# Patient Record
Sex: Female | Born: 1945
Health system: Southern US, Community
[De-identification: ages and names within clinical notes are randomized; demographics above are authoritative.]

## PROBLEM LIST (undated history)

## (undated) DIAGNOSIS — I499 Cardiac arrhythmia, unspecified: Secondary | ICD-10-CM

## (undated) DIAGNOSIS — J189 Pneumonia, unspecified organism: Secondary | ICD-10-CM

## (undated) DIAGNOSIS — K219 Gastro-esophageal reflux disease without esophagitis: Secondary | ICD-10-CM

## (undated) DIAGNOSIS — I491 Atrial premature depolarization: Secondary | ICD-10-CM

## (undated) DIAGNOSIS — I82622 Acute embolism and thrombosis of deep veins of left upper extremity: Secondary | ICD-10-CM

## (undated) DIAGNOSIS — I493 Ventricular premature depolarization: Secondary | ICD-10-CM

## (undated) DIAGNOSIS — R739 Hyperglycemia, unspecified: Secondary | ICD-10-CM

## (undated) DIAGNOSIS — I4891 Unspecified atrial fibrillation: Secondary | ICD-10-CM

## (undated) DIAGNOSIS — D72829 Elevated white blood cell count, unspecified: Secondary | ICD-10-CM

## (undated) DIAGNOSIS — C8590 Non-Hodgkin lymphoma, unspecified, unspecified site: Secondary | ICD-10-CM

## (undated) DIAGNOSIS — E46 Unspecified protein-calorie malnutrition: Secondary | ICD-10-CM

## (undated) DIAGNOSIS — R5381 Other malaise: Secondary | ICD-10-CM

## (undated) DIAGNOSIS — J45909 Unspecified asthma, uncomplicated: Secondary | ICD-10-CM

## (undated) DIAGNOSIS — N200 Calculus of kidney: Secondary | ICD-10-CM

## (undated) DIAGNOSIS — Z87898 Personal history of other specified conditions: Secondary | ICD-10-CM

## (undated) DIAGNOSIS — R0989 Other specified symptoms and signs involving the circulatory and respiratory systems: Secondary | ICD-10-CM

## (undated) DIAGNOSIS — N12 Tubulo-interstitial nephritis, not specified as acute or chronic: Secondary | ICD-10-CM

## (undated) DIAGNOSIS — D496 Neoplasm of unspecified behavior of brain: Secondary | ICD-10-CM

## (undated) DIAGNOSIS — R1310 Dysphagia, unspecified: Secondary | ICD-10-CM

## (undated) DIAGNOSIS — I209 Angina pectoris, unspecified: Secondary | ICD-10-CM

## (undated) DIAGNOSIS — I480 Paroxysmal atrial fibrillation: Secondary | ICD-10-CM

## (undated) DIAGNOSIS — M5136 Other intervertebral disc degeneration, lumbar region: Secondary | ICD-10-CM

## (undated) DIAGNOSIS — E876 Hypokalemia: Secondary | ICD-10-CM

## (undated) DIAGNOSIS — K449 Diaphragmatic hernia without obstruction or gangrene: Secondary | ICD-10-CM

## (undated) DIAGNOSIS — R911 Solitary pulmonary nodule: Secondary | ICD-10-CM

## (undated) DIAGNOSIS — I219 Acute myocardial infarction, unspecified: Secondary | ICD-10-CM

## (undated) DIAGNOSIS — K7689 Other specified diseases of liver: Secondary | ICD-10-CM

## (undated) DIAGNOSIS — I252 Old myocardial infarction: Secondary | ICD-10-CM

## (undated) DIAGNOSIS — G8918 Other acute postprocedural pain: Secondary | ICD-10-CM

## (undated) DIAGNOSIS — M51369 Other intervertebral disc degeneration, lumbar region without mention of lumbar back pain or lower extremity pain: Secondary | ICD-10-CM

## (undated) DIAGNOSIS — I2510 Atherosclerotic heart disease of native coronary artery without angina pectoris: Secondary | ICD-10-CM

## (undated) DIAGNOSIS — R42 Dizziness and giddiness: Secondary | ICD-10-CM

## (undated) DIAGNOSIS — E8809 Other disorders of plasma-protein metabolism, not elsewhere classified: Secondary | ICD-10-CM

## (undated) DIAGNOSIS — T380X5A Adverse effect of glucocorticoids and synthetic analogues, initial encounter: Secondary | ICD-10-CM

## (undated) DIAGNOSIS — M1990 Unspecified osteoarthritis, unspecified site: Secondary | ICD-10-CM

## (undated) DIAGNOSIS — H8110 Benign paroxysmal vertigo, unspecified ear: Secondary | ICD-10-CM

## (undated) DIAGNOSIS — I10 Essential (primary) hypertension: Secondary | ICD-10-CM

## (undated) DIAGNOSIS — Z9889 Other specified postprocedural states: Secondary | ICD-10-CM

## (undated) DIAGNOSIS — R112 Nausea with vomiting, unspecified: Secondary | ICD-10-CM

## (undated) DIAGNOSIS — K118 Other diseases of salivary glands: Secondary | ICD-10-CM

## (undated) DIAGNOSIS — Z87442 Personal history of urinary calculi: Secondary | ICD-10-CM

## (undated) HISTORY — DX: Personal history of other specified conditions: Z87.898

## (undated) HISTORY — DX: Tubulo-interstitial nephritis, not specified as acute or chronic: N12

## (undated) HISTORY — DX: Other malaise: R53.81

## (undated) HISTORY — DX: Gastro-esophageal reflux disease without esophagitis: K21.9

## (undated) HISTORY — DX: Unspecified protein-calorie malnutrition: E46

## (undated) HISTORY — DX: Elevated white blood cell count, unspecified: D72.829

## (undated) HISTORY — DX: Dysphagia, unspecified: R13.10

## (undated) HISTORY — DX: Paroxysmal atrial fibrillation: I48.0

## (undated) HISTORY — DX: Neoplasm of unspecified behavior of brain: D49.6

## (undated) HISTORY — DX: Other acute postprocedural pain: G89.18

## (undated) HISTORY — DX: Old myocardial infarction: I25.2

## (undated) HISTORY — DX: Unspecified osteoarthritis, unspecified site: M19.90

## (undated) HISTORY — DX: Hypokalemia: E87.6

## (undated) HISTORY — DX: Other diseases of salivary glands: K11.8

## (undated) HISTORY — DX: Other specified diseases of liver: K76.89

## (undated) HISTORY — DX: Benign paroxysmal vertigo, unspecified ear: H81.10

## (undated) HISTORY — DX: Other intervertebral disc degeneration, lumbar region: M51.36

## (undated) HISTORY — DX: Other specified symptoms and signs involving the circulatory and respiratory systems: R09.89

## (undated) HISTORY — DX: Other disorders of plasma-protein metabolism, not elsewhere classified: E88.09

## (undated) HISTORY — DX: Unspecified atrial fibrillation: I48.91

## (undated) HISTORY — PX: OTHER SURGICAL HISTORY: SHX169

## (undated) HISTORY — DX: Atrial premature depolarization: I49.1

## (undated) HISTORY — PX: BREAST BIOPSY: SHX20

## (undated) HISTORY — PX: FINGER SURGERY: SHX640

## (undated) HISTORY — DX: Essential (primary) hypertension: I10

## (undated) HISTORY — DX: Hyperglycemia, unspecified: R73.9

## (undated) HISTORY — DX: Hyperglycemia, unspecified: T38.0X5A

## (undated) HISTORY — DX: Acute embolism and thrombosis of deep veins of left upper extremity: I82.622

## (undated) HISTORY — DX: Non-Hodgkin lymphoma, unspecified, unspecified site: C85.90

## (undated) HISTORY — DX: Dizziness and giddiness: R42

## (undated) HISTORY — DX: Calculus of kidney: N20.0

## (undated) HISTORY — DX: Diaphragmatic hernia without obstruction or gangrene: K44.9

## (undated) HISTORY — DX: Other intervertebral disc degeneration, lumbar region without mention of lumbar back pain or lower extremity pain: M51.369

## (undated) HISTORY — DX: Solitary pulmonary nodule: R91.1

---
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
## 1971-03-03 HISTORY — PX: PARTIAL THYMECTOMY: SHX2177

## 1972-03-02 HISTORY — PX: VAGINAL HYSTERECTOMY: SUR661

## 1998-02-11 ENCOUNTER — Emergency Department (HOSPITAL_COMMUNITY): Admission: EM | Admit: 1998-02-11 | Discharge: 1998-02-11 | Payer: Self-pay | Admitting: *Deleted

## 2012-04-30 HISTORY — PX: CERVICAL SPINE SURGERY: SHX589

## 2013-09-30 HISTORY — PX: CARPAL TUNNEL RELEASE: SHX101

## 2015-09-19 ENCOUNTER — Encounter: Payer: Self-pay | Admitting: Interventional Cardiology

## 2015-10-30 ENCOUNTER — Encounter: Payer: Self-pay | Admitting: *Deleted

## 2015-10-31 ENCOUNTER — Encounter: Payer: Self-pay | Admitting: Cardiology

## 2015-10-31 ENCOUNTER — Encounter (INDEPENDENT_AMBULATORY_CARE_PROVIDER_SITE_OTHER): Payer: Self-pay

## 2015-10-31 ENCOUNTER — Ambulatory Visit (INDEPENDENT_AMBULATORY_CARE_PROVIDER_SITE_OTHER): Payer: Medicare Other | Admitting: Cardiology

## 2015-10-31 VITALS — BP 150/90 | HR 61 | Ht 61.0 in | Wt 183.8 lb

## 2015-10-31 DIAGNOSIS — I480 Paroxysmal atrial fibrillation: Secondary | ICD-10-CM

## 2015-10-31 NOTE — Patient Instructions (Addendum)
Medication Instructions:  Your physician recommends that you continue on your current medications as directed. Please refer to the Current Medication list given to you today.  Labwork: None ordered  Testing/Procedures: Your physician has requested that you have an echocardiogram. Echocardiography is a painless test that uses sound waves to create images of your heart. It provides your doctor with information about the size and shape of your heart and how well your heart's chambers and valves are working. This procedure takes approximately one hour. There are no restrictions for this procedure.  Follow-Up: Your physician recommends that you schedule a follow-up appointment in: 3 months with Dr. Curt Bears.  We will work on obtaining records from [facility]  If you need a refill on your cardiac medications before your next appointment, please call your pharmacy.  Thank you for choosing [facility]!!   Trinidad Curet, RN [phone]

## 2015-10-31 NOTE — Progress Notes (Signed)
Please   Electrophysiology Office Note   Date:  10/31/2015   ID:  Kimberly Vaughn, DOB 1945-10-18, MRN [id number]  PCP:  Ernestene Kiel, MD Primary Electrophysiologist:  Constance Haw, MD    Chief Complaint  Patient presents with  . Advice Only    afib eval  . Dizziness     History  Heof Present Illness: Kimberly Vaughn is a 70 y.o. female who presents today for electrophysiology evaluatishe presents for evaluation of atrial fibrillation and dizziness. She says that she is dizzy all the time though she has episodes of worsening of her dizziness. She has, in the past, had MRIs been evaluated by neurology, as well as ENT and has not found a cause of dizziness. She does have vertigo and is on therapy for that. She says that the dizziness feels like the room is spinning, similar to being on a boat.   She was diagnosed with atrial fibrillation in May 2016 while getting a stress test done. She says that at times, her blood pressure cuff tells her that she is out of rhythm. She feels occasional fatigue and shortness of breath with palpitations. Currently, she is not anticoagulated and is only taking aspirin.   Today, she denies symptoms of chest pain, shortness of breath, orthopnea, PND, lower extremity edema, claudication, presyncope, syncope, bleeding, or neurologic sequela. The patient is tolerating medications without difficulties and is otherwise he is in without complaint today.    Past Medical History:  Diagnosis Date  . A-fib ([place])   . DDD (degenerative disc disease), lumbar   . Degenerative joint disease   . Dizziness and giddiness   . Episodic atrial fibrillation ([place])   . Esophageal reflux   . GERD (gastroesophageal reflux disease)   . Hiatal hernia   . Hypertension, essential   . Kidney stones   . Liver cyst LEFT  . Nodule of parotid gland   . Pulmonary nodule    Past Surgical History:  Procedure Laterality Date  . BREAST BIOPSY Bilateral   . CARPAL TUNNEL RELEASE  Right 09/2013  . CATARACTS Bilateral   . CERVICAL SPINE SURGERY  04/2012  . FINGER SURGERY Right    TRIGGER FINGER RELEASE  . PARTIAL THYMECTOMY  1973  . TONSILLECTOMY  1964     Current Outpatient Prescriptions  Medication Sig Dispense Refill  . aspirin 325 MG tablet Take 325 mg by mouth daily.    . Cholecalciferol (VITAMIN D3) 2000 units TABS Take 2,000 Units by mouth daily.    . Cyanocobalamin (VITAMIN B-12) 5000 MCG SUBL Place under the tongue daily.    Marland Kitchen esomeprazole (NEXIUM) 40 MG packet Take 40 mg by mouth daily before breakfast.    . hydrochlorothiazide (MICROZIDE) 12.5 MG capsule Take 12.5 mg by mouth daily.    . meclizine (ANTIVERT) 25 MG tablet Take 25 mg by mouth 3 (three) times daily as needed for dizziness.    . metoprolol tartrate (LOPRESSOR) 25 MG tablet Take 12.5 mg by mouth 2 (two) times daily.    . Policosanol 10 MG CAPS Take 20 mg by mouth daily.     No current facility-administered medications for this visit.     Allergies:   Review of patient's allergies indicates no known allergies.   Social History:  The patient  reports that she quit smoking about 10 years ago. Her smoking use included Cigarettes. She has never used smokeless tobacco. She reports that she does not use drugs.   Family History:  The patient's family  history includes Heart attack (age of onset: 37) in her father; Heart disease in her father; Heart disease (age of onset: 57) in her mother; Hypertension in her daughter and other; Lymphoma in her other; Melanoma in her other; Osteoarthritis in her other; Psoriasis in her mother.    ROS:  Please see the history of present illness.   Otherwise, review of systems is positive for palpitations, balance problems, dizziness.   All other systems are reviewed and negative.    PHYSICAL EXAM: VS:  BP (!) 150/90   Pulse 61   Ht 5\' 1"  (1.549 m)   Wt 183 lb 12.8 oz (83.4 kg)   BMI 34.73 kg/m  , BMI Body mass index is 34.73 kg/m. GEN: Well nourished, well  developed, in no acute distress  HEENT: normal  Neck: no JVD, carotid bruits, or masses Cardiac: RRR; no murmurs, rubs, or gallops,no edema  Respiratory:  clear to auscultation bilaterally, normal work of breathing GI: soft, nontender, nondistended, + BS MS: no deformity or atrophy  Skin: warm and dry Neuro:  Strength and sensation are intact Psych: euthymic mood, full affect  EKG:  EKG is ordered today. Personal review of the ekg ordered shows sinus rhythm, rate 61  Recent Labs: No results found for requested labs within last 8760 hours.    Lipid Panel  No results found for: CHOL, TRIG, HDL, CHOLHDL, VLDL, LDLCALC, LDLDIRECT   Wt Readings from Last 3 Encounters:  10/31/15 183 lb 12.8 oz (83.4 kg)      Other studies Reviewed: Additional studies/ records that were reviewed today include: PCP notes   ASSESSMENT AND PLAN:  1.  Atrial fibrillation: In sinus rhythm today. She has a history of apparently atrial fibrillation, though I do not have any documentation of that. She is not anticoagulated and is taking full dose aspirin. It is likely that she Kimberly Vaughn need anticoagulation, and I discussed with her the risks and benefits of multiple anticoagulants. Kimberly Vaughn work to obtain records of her atrial fibrillation from her stress test in May 2016. In the interim Kimberly Vaughn get a echocardiogram to determine her LV function. She is symptomatic apparently from her atrial fibrillation and therefore may benefit from a rhythm control strategy.   This patients CHA2DS2-VASc Score and unadjusted Ischemic Stroke Rate (% per year) is equal to 3.2 % stroke rate/year from a score of 3  Above score calculated as 1 point each if present [CHF, HTN, DM, Vascular=MI/PAD/Aortic Plaque, Age if 65-74, or Female] Above score calculated as 2 points each if present [Age > 75, or Stroke/TIA/TE]   2. Hypertension: Elevated today, but according to her home blood pressure cuff, is normally in the 120s. We'll not make any  changes to her medications.  3. Dizziness: At this time it is unclear what is causing her to be dizzy. She is dizzy today in clinic with a EKG that does not show any evidence of arrhythmia. It is unlikely, therefore, that she has an arrhythmogenic cause for her dizziness. We Kimberly Vaughn get a transthoracic echo to both evaluate her atrial fibrillation as well as see if there is a structural cause for dizziness.   Current medicines are reviewed at length with the patient today.   The patient does not have concerns regarding her medicines.  The following changes were made today:  none  Labs/ tests ordered today include:  No orders of the defined types were placed in this encounter.    Disposition:   FU with Kimberly Vaughn  3 months  Signed, Kimberly Neaves Meredith Leeds, MD  10/31/2015 9:32 AM     [facility] [address] [place] [address] [phone] (office) [phone] (fax)

## 2015-11-15 NOTE — Progress Notes (Signed)
Dr. Curt Bears reviewed stress test EKGs from [facility].  AFib noted.  Records scanned into EPIC.

## 2015-11-21 ENCOUNTER — Ambulatory Visit (HOSPITAL_COMMUNITY): Payer: Medicare Other | Attending: Internal Medicine

## 2015-11-21 ENCOUNTER — Other Ambulatory Visit: Payer: Self-pay

## 2015-11-21 DIAGNOSIS — I340 Nonrheumatic mitral (valve) insufficiency: Secondary | ICD-10-CM | POA: Insufficient documentation

## 2015-11-21 DIAGNOSIS — I119 Hypertensive heart disease without heart failure: Secondary | ICD-10-CM | POA: Insufficient documentation

## 2015-11-21 DIAGNOSIS — I480 Paroxysmal atrial fibrillation: Secondary | ICD-10-CM | POA: Insufficient documentation

## 2015-11-21 DIAGNOSIS — I4891 Unspecified atrial fibrillation: Secondary | ICD-10-CM | POA: Diagnosis present

## 2015-11-27 DIAGNOSIS — H9312 Tinnitus, left ear: Secondary | ICD-10-CM

## 2015-11-27 HISTORY — DX: Tinnitus, left ear: H93.12

## 2015-11-30 DIAGNOSIS — I480 Paroxysmal atrial fibrillation: Secondary | ICD-10-CM | POA: Insufficient documentation

## 2015-12-05 ENCOUNTER — Telehealth: Payer: Self-pay | Admitting: Cardiology

## 2015-12-05 NOTE — Telephone Encounter (Signed)
Records received from [facility] hold onto until [place] in Friday 10/6

## 2015-12-16 ENCOUNTER — Telehealth: Payer: Self-pay

## 2015-12-16 ENCOUNTER — Telehealth: Payer: Self-pay | Admitting: *Deleted

## 2015-12-16 MED ORDER — APIXABAN 5 MG PO TABS: 5.0000 mg | ORAL_TABLET | Freq: Two times a day (BID) | ORAL | 10 refills | Status: DC

## 2015-12-16 MED ORDER — APIXABAN 5 MG PO TABS: 5.0000 mg | ORAL_TABLET | Freq: Two times a day (BID) | ORAL | 0 refills | Status: DC

## 2015-12-16 NOTE — Telephone Encounter (Signed)
Pt advised to stop ASA.   Notes Recorded by Stanton Kidney, RN on 12/16/2015 at 1:29 PM EDT Patient advised to start Eliquis 5 mg BID. 30 day free card mailed to home address. Rx sent to [facility]. Patient verbalized understanding and agreeable to plan.   ------  Notes Recorded by Stanton Kidney, RN on 12/16/2015 at 12:29 PM EDT Reached PCP office. They are faxing CMP completed in July ------  Notes Recorded by Stanton Kidney, RN on 12/06/2015 at 4:00 PM EDT Patient does not have any labs in system to determine dosing of medication. Her PCP office is closed. Will attempt to reach PCP Monday. Pt is agreeable. ------  Notes Recorded by Stanton Kidney, RN on 12/04/2015 at 5:48 PM EDT Reviewed results with patient who verbalized understanding. Will call patient on Friday to arrange her coming to office for samples and 30 day free card. She is agreeable to plan.

## 2015-12-16 NOTE — Telephone Encounter (Signed)
Prior auth for Eliquis 5 mg submitted to [facility]. 

## 2015-12-20 ENCOUNTER — Encounter: Payer: Self-pay | Admitting: *Deleted

## 2015-12-20 NOTE — Telephone Encounter (Signed)
This encounter was created in error - please disregard.

## 2015-12-23 ENCOUNTER — Telehealth: Payer: Self-pay | Admitting: Cardiology

## 2015-12-23 NOTE — Telephone Encounter (Signed)
New Message:     Please call,have some concerns about her taking Eliquis.

## 2015-12-23 NOTE — Telephone Encounter (Signed)
I called and spoke with the patient and answered her questions about eliquis. She was concerned as she had had a history of back surgery and wanted to make sure she could take this medication. I advised her she could proceed with taking this medication, but to observe for signs/ symptoms of bleeding or if she starts to have back/ neck pain that this new/ different without any apparent reason for this, then she should call and let us know.  She verbalized understanding.

## 2015-12-24 ENCOUNTER — Ambulatory Visit (INDEPENDENT_AMBULATORY_CARE_PROVIDER_SITE_OTHER): Payer: Medicare Other | Admitting: Neurology

## 2015-12-24 ENCOUNTER — Encounter: Payer: Self-pay | Admitting: Neurology

## 2015-12-24 VITALS — BP 121/67 | HR 68 | Ht 61.0 in | Wt 183.4 lb

## 2015-12-24 DIAGNOSIS — R42 Dizziness and giddiness: Secondary | ICD-10-CM

## 2015-12-24 DIAGNOSIS — H8110 Benign paroxysmal vertigo, unspecified ear: Secondary | ICD-10-CM

## 2015-12-24 MED ORDER — ONDANSETRON HCL 8 MG PO TABS: 8.0000 mg | ORAL_TABLET | Freq: Two times a day (BID) | ORAL | 1 refills | Status: DC

## 2015-12-24 NOTE — Patient Instructions (Signed)
Remember to drink plenty of fluid, eat healthy meals and do not skip any meals. Try to eat protein with a every meal and eat a healthy snack such as fruit or nuts in between meals. Try to keep a regular sleep-wake schedule and try to exercise daily, particularly in the form of walking, 20-30 minutes a day, if you can.   As far as your medications are concerned, I would like to suggest: Ondansetron 8mg  twice a day  As far as diagnostic testing: Vestibular therapy  I would like to see you back as needed, sooner if we need to. Please call us with any interim questions, concerns, problems, updates or refill requests.   Our phone number is [phone]. We also have an after hours call service for urgent matters and there is a physician on-call for urgent questions. For any emergencies you know to call 911 or go to the nearest emergency room  Ondansetron tablets What is this medicine? ONDANSETRON (on DAN se tron) is used to treat nausea and vomiting caused by chemotherapy. It is also used to prevent or treat nausea and vomiting after surgery. This medicine may be used for other purposes; ask your health care provider or pharmacist if you have questions. What should I tell my health care provider before I take this medicine? They need to know if you have any of these conditions: -heart disease -history of irregular heartbeat -liver disease -low levels of magnesium or potassium in the blood -an unusual or allergic reaction to ondansetron, granisetron, other medicines, foods, dyes, or preservatives -pregnant or trying to get pregnant -breast-feeding How should I use this medicine? Take this medicine by mouth with a glass of water. Follow the directions on your prescription label. Take your doses at regular intervals. Do not take your medicine more often than directed. Talk to your pediatrician regarding the use of this medicine in children. Special care may be needed. Overdosage: If you think you  have taken too much of this medicine contact a poison control center or emergency room at once. NOTE: This medicine is only for you. Do not share this medicine with others. What if I miss a dose? If you miss a dose, take it as soon as you can. If it is almost time for your next dose, take only that dose. Do not take double or extra doses. What may interact with this medicine? Do not take this medicine with any of the following medications: -apomorphine -certain medicines for fungal infections like fluconazole, itraconazole, ketoconazole, posaconazole, voriconazole -cisapride -dofetilide -dronedarone -pimozide -thioridazine -ziprasidone This medicine may also interact with the following medications: -carbamazepine -certain medicines for depression, anxiety, or psychotic disturbances -fentanyl -linezolid -MAOIs like Carbex, Eldepryl, Marplan, Nardil, and Parnate -methylene blue (injected into a vein) -other medicines that prolong the QT interval (cause an abnormal heart rhythm) -phenytoin -rifampicin -tramadol This list may not describe all possible interactions. Give your health care provider a list of all the medicines, herbs, non-prescription drugs, or dietary supplements you use. Also tell them if you smoke, drink alcohol, or use illegal drugs. Some items may interact with your medicine. What should I watch for while using this medicine? Check with your doctor or health care professional right away if you have any sign of an allergic reaction. What side effects may I notice from receiving this medicine? Side effects that you should report to your doctor or health care professional as soon as possible: -allergic reactions like skin rash, itching or hives, swelling of the  face, lips or tongue -breathing problems -confusion -dizziness -fast or irregular heartbeat -feeling faint or lightheaded, falls -fever and chills -loss of balance or  coordination -seizures -sweating -swelling of the hands or feet -tightness in the chest -tremors -unusually weak or tired Side effects that usually do not require medical attention (report to your doctor or health care professional if they continue or are bothersome): -constipation or diarrhea -headache This list may not describe all possible side effects. Call your doctor for medical advice about side effects. You may report side effects to FDA at 1-800-FDA-1088. Where should I keep my medicine? Keep out of the reach of children. Store between 2 and 30 degrees C (36 and 86 degrees F). Throw away any unused medicine after the expiration date. NOTE: This sheet is a summary. It may not cover all possible information. If you have questions about this medicine, talk to your doctor, pharmacist, or health care provider.    2016, Elsevier/Gold Standard. (2012-11-23 16:27:45)

## 2015-12-24 NOTE — Progress Notes (Signed)
[facility]    Provider:  Dr Jaynee Eagles Referring Provider: Ernestene Kiel, MD Primary Care Physician:  Ernestene Kiel, MD  CC:  dizziness  HPI:  Kimberly Vaughn is a 70 y.o. female here as a referral from Dr. Laqueta Due for dizziness.  Past medical history of episodic atrial fibrillation, dizziness, balance problems, hypertension, degenerative disc disease, former smoker, hyperlipidemia, pulmonary nodule, kidney stones. Here with daughter who provides information. Started June 11th, she had acute onset dizziness, light headedness, worse with heat. She felt lightheaded and vision blurry. The room doesn't spin. Worse with standing and walking. Heat triggers it. She is also dizzy sitting, feels like a "bobble head", like she is on an ocean. Driving doesn't bother her. She feels "wavy" even when sitting. Always when she gets up. She drinks enough water. She feels like she wobbles. She loses her balance and starts leaning because she feels lightheaded, dizzy, like the blood is flowing out of the brain. She has a history of vertigo 8 years ago. Meclizine doesn't help with the symptoms. She was seen by ENT and workup was negative. Carotid studies were normal, she say a cardiologist, ENT and MRI of the brain was negative. She has been referred for balance therapy. She feels like she is underwater sometimes when sitting. Doesn;t happen in bed, better when laying down. No other associated symptoms or modifying factors.   Reviewed notes, labs and imaging from outside physicians, which showed: Patient has chronic dizziness. Also diagnosis of episodic atrial fibrillation, balance problems.  Reviewed images  from [facility]. CT and MRI:   CT of the neck with contrast media. Normal no mass or swelling in the pharynx and married, normal salivary glands, thyroid normal, lymph nodes normal, vascular negative mild atherosclerosis, imitated intracranial negative, visualize orbits  negative, no acute or aggressive process in the skeleton prior cervical fusion appears uncomplicated adjacent segment disease suspected above the C4 C6 fusion.  MRI of the head with and without contrast was negative, normal with mild age-appropriate volume loss, no cause of sudden onset dizziness and vertigo identified.  CMP with BUN 16 creatinine .77 09/19/2015   Review of Systems: Patient complains of symptoms per HPI as well as the following symptoms: palpitations, ringing in ears, birth marks, flushing. Pertinent negatives per HPI. All others negative.   Social History   Social History  . Marital status: Married    Spouse name: N/A  . Number of children: 1  . Years of education: 11   Occupational History  . Retired    Social History Main Topics  . Smoking status: Former Smoker    Types: Cigarettes    Quit date: 10/23/2005  . Smokeless tobacco: Never Used  . Alcohol use No  . Drug use: No  . Sexual activity: Not on file     Comment: MARRIED   Other Topics Concern  . Not on file   Social History Narrative   Lives with husband   Caffeine use: 2 cups coffee per day    Family History  Problem Relation Age of Onset  . Heart disease Mother 38  . Psoriasis Mother     PUSTULAR  . Heart attack Father 46  . Heart disease Father   . Lymphoma Sister   . Melanoma Brother   . Hypertension Daughter   . Lymphoma Other   . Osteoarthritis Other   . Melanoma Other   . Hypertension Other     Past Medical History:  Diagnosis Date  . A-fib (  [place])   . DDD (degenerative disc disease), lumbar   . Degenerative joint disease   . Dizziness and giddiness   . Episodic atrial fibrillation ([place])   . Esophageal reflux   . GERD (gastroesophageal reflux disease)   . Hiatal hernia   . Hypertension, essential   . Kidney stones   . Liver cyst LEFT  . Nodule of parotid gland   . Pulmonary nodule     Past Surgical History:  Procedure Laterality Date  . BREAST BIOPSY Bilateral   .  CARPAL TUNNEL RELEASE Right 09/2013  . CATARACTS Bilateral   . CERVICAL SPINE SURGERY  04/2012  . FINGER SURGERY Right    TRIGGER FINGER RELEASE  . PARTIAL THYMECTOMY  1973  . TONSILLECTOMY  1964  . VAGINAL HYSTERECTOMY  1974    Current Outpatient Prescriptions  Medication Sig Dispense Refill  . apixaban (ELIQUIS) 5 MG TABS tablet Take 1 tablet (5 mg total) by mouth 2 (two) times daily. 60 tablet 10  . aspirin 325 MG tablet Take 325 mg by mouth daily.    . Cholecalciferol (VITAMIN D3) 2000 units TABS Take 2,000 Units by mouth daily.    . Cyanocobalamin (VITAMIN B-12) 5000 MCG SUBL Place under the tongue daily.    Marland Kitchen esomeprazole (NEXIUM) 40 MG packet Take 40 mg by mouth daily before breakfast.    . hydrochlorothiazide (MICROZIDE) 12.5 MG capsule Take 12.5 mg by mouth daily.    . meclizine (ANTIVERT) 25 MG tablet Take 25 mg by mouth 3 (three) times daily as needed for dizziness.    . metoprolol tartrate (LOPRESSOR) 25 MG tablet Take 12.5 mg by mouth 2 (two) times daily.    . Policosanol 10 MG CAPS Take 20 mg by mouth daily.    . ondansetron (ZOFRAN) 8 MG tablet Take 1 tablet (8 mg total) by mouth 2 (two) times daily. 60 tablet 1   No current facility-administered medications for this visit.     Allergies as of 12/24/2015  . (No Known Allergies)    Vitals: BP 121/67 (BP Location: Right Arm, Patient Position: Sitting, Cuff Size: Normal)   Pulse 68   Ht 5\' 1"  (1.549 m)   Wt 183 lb 6.4 oz (83.2 kg)   BMI 34.65 kg/m  Last Weight:  Wt Readings from Last 1 Encounters:  12/24/15 183 lb 6.4 oz (83.2 kg)   Last Height:   Ht Readings from Last 1 Encounters:  12/24/15 5\' 1"  (1.549 m)   Physical exam: Exam: Gen: NAD, conversant, well nourised, obese, well groomed                     CV: RRR, no MRG. No Carotid Bruits. No peripheral edema, warm, nontender Eyes: Conjunctivae clear without exudates or hemorrhage  Neuro: Detailed Neurologic Exam  Speech:    Speech is normal;  fluent and spontaneous with normal comprehension.  Cognition:    The patient is oriented to person, place, and time;     recent and remote memory intact;     language fluent;     normal attention, concentration,     fund of knowledge Cranial Nerves:    The pupils are equal, round, and reactive to light. The fundi are flat. Visual fields are full to finger confrontation. Extraocular movements are intact. Trigeminal sensation is intact and the muscles of mastication are normal. The face is symmetric. The palate elevates in the midline. Hearing intact. Voice is normal. Shoulder shrug is normal. The tongue  has normal motion without fasciculations.   Coordination:    Normal finger to nose and heel to shin.  Gait:    Not ataxic  Motor Observation:    No asymmetry, no atrophy, and no involuntary movements noted. Tone:    Normal muscle tone.    Posture:    Posture is normal. normal erect    Strength:    Strength is V/V in the upper and lower limbs.      Sensation: intact to LT     Reflex Exam:  DTR's:    Deep tendon reflexes in the upper and lower extremities are normal bilaterally.   Toes:    The toes are downgoing bilaterally.   Clonus:    Clonus is absent.  +Dix Hallpike positive with left head turning. Head thrust normal.     Assessment/Plan:  70 year old with dizziness. +Diz Hallpike positive with left head turning. Suggests BPPV. She is on a cardiac monitor currently. MRI of the brain negative. Needs Vestibular therapy. Ondansetron 8mg  twice a day. Discussed side effects.   Cc: Dr. Fonnie Birkenhead, [facility] [address] [place], [place] [address]  Phone [phone] Fax [phone]

## 2015-12-25 ENCOUNTER — Telehealth: Payer: Self-pay

## 2015-12-25 DIAGNOSIS — H8110 Benign paroxysmal vertigo, unspecified ear: Secondary | ICD-10-CM

## 2015-12-25 HISTORY — DX: Benign paroxysmal vertigo, unspecified ear: H81.10

## 2015-12-25 NOTE — Telephone Encounter (Signed)
Eliquis 5mg  approved by [facility]. [id number], is good through 03/01/2017.

## 2016-01-22 ENCOUNTER — Encounter (HOSPITAL_COMMUNITY): Payer: Self-pay | Admitting: Emergency Medicine

## 2016-01-22 ENCOUNTER — Emergency Department (HOSPITAL_COMMUNITY): Payer: Medicare Other

## 2016-01-22 ENCOUNTER — Emergency Department (HOSPITAL_COMMUNITY)
Admission: EM | Admit: 2016-01-22 | Discharge: 2016-01-22 | Disposition: A | Discharge status: Home / Self Care | Payer: Medicare Other | Attending: Emergency Medicine | Admitting: Emergency Medicine

## 2016-01-22 DIAGNOSIS — I10 Essential (primary) hypertension: Secondary | ICD-10-CM | POA: Insufficient documentation

## 2016-01-22 DIAGNOSIS — Z87891 Personal history of nicotine dependence: Secondary | ICD-10-CM | POA: Insufficient documentation

## 2016-01-22 DIAGNOSIS — R0789 Other chest pain: Secondary | ICD-10-CM | POA: Diagnosis not present

## 2016-01-22 LAB — URINALYSIS, ROUTINE W REFLEX MICROSCOPIC
BILIRUBIN URINE: NEGATIVE
GLUCOSE, UA: NEGATIVE mg/dL
HGB URINE DIPSTICK: NEGATIVE
Ketones, ur: NEGATIVE mg/dL
Leukocytes, UA: NEGATIVE
Nitrite: NEGATIVE
PROTEIN: NEGATIVE mg/dL
Specific Gravity, Urine: 1.007 (ref 1.005–1.030)
pH: 6.5 (ref 5.0–8.0)

## 2016-01-22 LAB — BASIC METABOLIC PANEL
Anion gap: 8 (ref 5–15)
BUN: 12 mg/dL (ref 6–20)
CALCIUM: 9.7 mg/dL (ref 8.9–10.3)
CO2: 26 mmol/L (ref 22–32)
CREATININE: 0.81 mg/dL (ref 0.44–1.00)
Chloride: 105 mmol/L (ref 101–111)
GFR calc Af Amer: >60 mL/min (ref >60)
Glucose, Bld: 93 mg/dL (ref 65–99)
POTASSIUM: 4 mmol/L (ref 3.5–5.1)
SODIUM: 139 mmol/L (ref 135–145)

## 2016-01-22 LAB — CBC
HCT: 37.4 % (ref 36.0–46.0)
Hemoglobin: 12.9 g/dL (ref 12.0–15.0)
MCH: 31.9 pg (ref 26.0–34.0)
MCHC: 34.5 g/dL (ref 30.0–36.0)
MCV: 92.3 fL (ref 78.0–100.0)
PLATELETS: 254 10*3/uL (ref 150–400)
RBC: 4.05 MIL/uL (ref 3.87–5.11)
RDW: 12.7 % (ref 11.5–15.5)
WBC: 9.3 10*3/uL (ref 4.0–10.5)

## 2016-01-22 LAB — I-STAT TROPONIN, ED: TROPONIN I, POC: 0 ng/mL (ref 0.00–0.08)

## 2016-01-22 NOTE — ED Notes (Signed)
Dr Long at bedside

## 2016-01-22 NOTE — ED Provider Notes (Signed)
Emergency Department Provider Note   I have reviewed the triage vital signs and the nursing notes.   HISTORY  Chief Complaint Hypertension and Chest Pain   HPI Kimberly Vaughn is a 70 y.o. female with PMH of a-fib, DDD, HTN, HLD, and GERD presents to the emergency department for evaluation of a symptomatically hypertension. The patient went for her physical therapy appointment today and they performed a routine blood pressure which was reported to be 180/100. Patient states that over the past several weeks she's had high blood pressures while visiting the physical therapist but her pressures are not otherwise elevated at home. She has ongoing gait instability and intermittent chest pressure. She is following with her allergy and cardiology regarding these issues. They've not worsen significantly over the past week but she reports having gotten slightly worse over the last 2 months.   The patient specifically denies any chest pain, dyspnea, lightheadedness, vertigo, weakness, numbness, and/or decreased urinary output. She took her PM dose of BP medication prior to PT.    Past Medical History:  Diagnosis Date  . A-fib ([place])   . DDD (degenerative disc disease), lumbar   . Degenerative joint disease   . Dizziness and giddiness   . Episodic atrial fibrillation ([place])   . Esophageal reflux   . GERD (gastroesophageal reflux disease)   . Hiatal hernia   . Hypertension, essential   . Kidney stones   . Liver cyst LEFT  . Nodule of parotid gland   . Pulmonary nodule     Patient Active Problem List   Diagnosis Date Noted  . BPPV (benign paroxysmal positional vertigo) 12/25/2015    Past Surgical History:  Procedure Laterality Date  . BREAST BIOPSY Bilateral   . CARPAL TUNNEL RELEASE Right 09/2013  . CATARACTS Bilateral   . CERVICAL SPINE SURGERY  04/2012  . FINGER SURGERY Right    TRIGGER FINGER RELEASE  . PARTIAL THYMECTOMY  1973  . TONSILLECTOMY  1964  . VAGINAL HYSTERECTOMY   1974    Current Outpatient Rx  . Order #: [id number] Class: Normal  . Order #: [id number] Class: Historical Med  . Order #: [id number] Class: Historical Med  . Order #: [id number] Class: Historical Med  . Order #: [id number] Class: Historical Med  . Order #: [id number] Class: Historical Med  . Order #: [id number] Class: Historical Med  . Order #: [id number] Class: Historical Med  . Order #: [id number] Class: Normal  . Order #: [id number] Class: Historical Med    Allergies Patient has no known allergies.  Family History  Problem Relation Age of Onset  . Heart disease Mother 43  . Psoriasis Mother     PUSTULAR  . Heart attack Father 70  . Heart disease Father   . Lymphoma Sister   . Melanoma Brother   . Hypertension Daughter   . Lymphoma Other   . Osteoarthritis Other   . Melanoma Other   . Hypertension Other     Social History Social History  Substance Use Topics  . Smoking status: Former Smoker    Types: Cigarettes    Quit date: 10/23/2005  . Smokeless tobacco: Never Used  . Alcohol use No    Review of Systems  Constitutional: No fever/chills Eyes: No visual changes. ENT: No sore throat. Cardiovascular: Denies chest pain. Respiratory: Denies shortness of breath. Gastrointestinal: No abdominal pain.  No nausea, no vomiting.  No diarrhea.  No constipation. Genitourinary: Negative for dysuria. Musculoskeletal: Negative for back pain. Skin: Negative for rash. Neurological: Negative for headaches, focal weakness or  numbness.  10-point ROS otherwise negative.  ____________________________________________   PHYSICAL EXAM:  VITAL SIGNS: ED Triage Vitals  Enc Vitals Group     BP 01/22/16 1830 159/81     Pulse Rate 01/22/16 1830 63     Resp 01/22/16 1830 16     Temp 01/22/16 1830 98 F (36.7 C)     Temp Source 01/22/16 1830 Oral     SpO2 01/22/16 1830 98 %     Pain Score 01/22/16 1829 0   Constitutional: Alert and oriented. Well appearing and in no acute distress. Eyes:  Conjunctivae are normal. Head: Atraumatic. Nose: No congestion/rhinnorhea. Mouth/Throat: Mucous membranes are moist.   Neck: No stridor.   Cardiovascular: Normal rate, regular rhythm. Good peripheral circulation. Grossly normal heart sounds.   Respiratory: Normal respiratory effort.  No retractions. Lungs CTAB. Gastrointestinal: Soft and nontender. No distention.  Musculoskeletal: No lower extremity tenderness nor edema. No gross deformities of extremities. Neurologic:  Normal speech and language. No gross focal neurologic deficits are appreciated.  Skin:  Skin is warm, dry and intact. No rash noted.  ____________________________________________   LABS (all labs ordered are listed, but only abnormal results are displayed)  Labs Reviewed  BASIC METABOLIC PANEL  CBC  URINALYSIS, ROUTINE W REFLEX MICROSCOPIC (NOT AT [facility])  I-STAT TROPOININ, ED   ____________________________________________  EKG   EKG Interpretation  Date/Time:  Wednesday January 22 2016 18:33:30 EST Ventricular Rate:  67 PR Interval:  206 QRS Duration: 76 QT Interval:  402 QTC Calculation: 424 R Axis:   59 Text Interpretation:  Normal sinus rhythm Normal ECG No STEMI.  Confirmed by Kanaan Kagawa MD, Shawnice Tilmon [phone]) on 01/22/2016 8:29:42 PM       ____________________________________________  RADIOLOGY  Dg Chest 2 View  Result Date: 01/22/2016 CLINICAL DATA:  Pt was at physical therapy and staff noticed her BP was elevated. Pt has been feeling lightheaded and dizzy for a while. Pt's systolic BP 99991111. Pt also states she has some chest pressure, and palpitations at times. Pt states she h.*comment was truncated* EXAM: CHEST  2 VIEW COMPARISON:  None. FINDINGS: Normal mediastinum and cardiac silhouette. Normal pulmonary vasculature. No evidence of effusion, infiltrate, or pneumothorax. No acute bony abnormality. IMPRESSION: No acute cardiopulmonary process. Electronically Signed   By: Suzy Bouchard M.D.   On:  01/22/2016 19:05    ____________________________________________   PROCEDURES  Procedure(s) performed:   Procedures  None ____________________________________________   INITIAL IMPRESSION / ASSESSMENT AND PLAN / ED COURSE  Pertinent labs & imaging results that were available during my care of the patient were reviewed by me and considered in my medical decision making (see chart for details).  Patient resents to the emergency department for evaluation of a symptom medical hypertension. She has ongoing gait instability and chest pressure. She seen multiple specialists, had outpatient imaging, and cardiology follow up including outpatient monitoring. Her symptoms did not acutely worsen today. She was found to have elevated blood pressure through routine measurement. Labs are unremarkable including kidney function, troponin, and EKG. Chest x-ray is normal. UA pending plan for discharge if negative.  20:58  UA negative. Plan for discharge with outpatient f/u with PCP for BP med evaluation in the coming days.   At this time, I do not feel there is any life-threatening condition present. I have reviewed and discussed all results (EKG, imaging, lab, urine as appropriate), exam findings with patient. I have reviewed nursing notes and appropriate previous records.  I feel the patient is safe  to be discharged home without further emergent workup. Discussed usual and customary return precautions. Patient and family (if present) verbalize understanding and are comfortable with this plan.  Patient will follow-up with their primary care provider. If they do not have a primary care provider, information for follow-up has been provided to them. All questions have been answered.  ____________________________________________  FINAL CLINICAL IMPRESSION(S) / ED DIAGNOSES  Final diagnoses:  Hypertension, unspecified type     MEDICATIONS GIVEN DURING THIS VISIT:  None  NEW OUTPATIENT MEDICATIONS  STARTED DURING THIS VISIT:  None   Note:  This document was prepared using Dragon voice recognition software and may include unintentional dictation errors.  Nanda Quinton, MD Emergency Medicine   Margette Fast, MD 01/22/16 [phone]

## 2016-01-22 NOTE — ED Notes (Signed)
Patient Alert and oriented X4. Stable and ambulatory. Patient verbalized understanding of the discharge instructions.  Patient belongings were taken by the patient.  

## 2016-01-22 NOTE — Discharge Instructions (Signed)

## 2016-01-22 NOTE — ED Triage Notes (Signed)
Pt was at physical therapy and staff noticed her BP was elevated. Pt has been feeling lightheaded and dizzy for a while. Pt's systolic BP 99991111. Pt also states she has some chest pressure, and palpitations at times. Pt states she has been sleeping on two pillows because of the chest pain.

## 2016-01-22 NOTE — ED Notes (Signed)
Pt said that starting on Saturday she cant hear anything out of her left ear. Pt said they looked at it at urgent care but they didn't say anything about it. MD made aware

## 2016-01-30 ENCOUNTER — Ambulatory Visit (INDEPENDENT_AMBULATORY_CARE_PROVIDER_SITE_OTHER): Payer: Medicare Other | Admitting: Cardiology

## 2016-01-30 ENCOUNTER — Encounter: Payer: Self-pay | Admitting: Cardiology

## 2016-01-30 VITALS — BP 128/72 | HR 62 | Ht 61.0 in | Wt 181.2 lb

## 2016-01-30 DIAGNOSIS — I480 Paroxysmal atrial fibrillation: Secondary | ICD-10-CM | POA: Diagnosis not present

## 2016-01-30 NOTE — Patient Instructions (Addendum)
Medication Instructions:    Your physician recommends that you continue on your current medications as directed. Please refer to the Current Medication list given to you today.  --- If you need a refill on your cardiac medications before your next appointment, please call your pharmacy. ---  Labwork:  None ordered  Testing/Procedures:  None ordered  Follow-Up:  Your physician wants you to follow-up in: 6 months with Dr. Curt Bears.  You will receive a reminder letter in the mail two months in advance. If you don't receive a letter, please call our office to schedule the follow-up appointment.   Any Other Special Instructions Will Be Listed Below (If Applicable). - We will get monitor results for Dr. Curt Bears to review  Thank you for choosing [facility]!!   Trinidad Curet, RN [phone]

## 2016-01-30 NOTE — Progress Notes (Signed)
Please   Electrophysiology Office Note   Date:  01/30/2016   ID:  Kimberly Vaughn, DOB 12-09-1945, MRN [id number]  PCP:  Ernestene Kiel, MD Primary Electrophysiologist:  Constance Haw, MD    Chief Complaint  Patient presents with  . Follow-up    PAF     History  Heof Present Illness: Kimberly Vaughn is a 70 y.o. female who presents today for electrophysiology evaluation. She presents for evaluation of atrial fibrillation and dizziness. She was diagnosed with atrial fibrillation in May 2016 while getting a stress test done.   Today, she denies symptoms of chest pain, shortness of breath, orthopnea, PND, lower extremity edema, claudication, presyncope, syncope, bleeding, or neurologic sequela. The patient is tolerating medications without difficulties and is otherwise he is in without complaint today. Currently she is feels, but she does have some episodes where she gets fatigued, dizzy, and "swimmy headed." She says these occur once every few days or more times per month. When she is not having one of these episodes, she feels well. She is unclear whether or not she is in atrial fibrillation during these episodes.     Past Medical History:  Diagnosis Date  . A-fib ([place])   . DDD (degenerative disc disease), lumbar   . Degenerative joint disease   . Dizziness and giddiness   . Episodic atrial fibrillation ([place])   . Esophageal reflux   . GERD (gastroesophageal reflux disease)   . Hiatal hernia   . Hypertension, essential   . Kidney stones   . Liver cyst LEFT  . Nodule of parotid gland   . Pulmonary nodule    Past Surgical History:  Procedure Laterality Date  . BREAST BIOPSY Bilateral   . CARPAL TUNNEL RELEASE Right 09/2013  . CATARACTS Bilateral   . CERVICAL SPINE SURGERY  04/2012  . FINGER SURGERY Right    TRIGGER FINGER RELEASE  . PARTIAL THYMECTOMY  1973  . TONSILLECTOMY  1964  . VAGINAL HYSTERECTOMY  1974     Current Outpatient Prescriptions  Medication Sig  Dispense Refill  . apixaban (ELIQUIS) 5 MG TABS tablet Take 1 tablet (5 mg total) by mouth 2 (two) times daily. 60 tablet 10  . Cholecalciferol (VITAMIN D3) 2000 units TABS Take 2,000 Units by mouth daily.    . Cyanocobalamin (VITAMIN B-12) 5000 MCG SUBL Place under the tongue daily.    Marland Kitchen esomeprazole (NEXIUM) 40 MG packet Take 40 mg by mouth daily before breakfast.    . metoprolol tartrate (LOPRESSOR) 25 MG tablet Take 12.5 mg by mouth 2 (two) times daily.    . ondansetron (ZOFRAN) 8 MG tablet Take 1 tablet (8 mg total) by mouth 2 (two) times daily. 60 tablet 1  . Policosanol 10 MG CAPS Take 20 mg by mouth daily.     No current facility-administered medications for this visit.     Allergies:   Patient has no known allergies.   Social History:  The patient  reports that she quit smoking about 10 years ago. Her smoking use included Cigarettes. She has never used smokeless tobacco. She reports that she does not drink alcohol or use drugs.   Family History:  The patient's family history includes Heart attack (age of onset: 3) in her father; Heart disease in her father; Heart disease (age of onset: 75) in her mother; Hypertension in her daughter and other; Lymphoma in her other and sister; Melanoma in her brother and other; Osteoarthritis in her other; Psoriasis in her mother.  ROS:  Please see the history of present illness.   Otherwise, review of systems is positive for palpitations, hearing loss, balance problems, dizziness.   All other systems are reviewed and negative.    PHYSICAL EXAM: VS:  BP 128/72   Pulse 62   Ht 5\' 1"  (1.549 m)   Wt 181 lb 3.2 oz (82.2 kg)   BMI 34.24 kg/m  , BMI Body mass index is 34.24 kg/m. GEN: Well nourished, well developed, in no acute distress  HEENT: normal  Neck: no JVD, carotid bruits, or masses Cardiac: RRR; no murmurs, rubs, or gallops,no edema  Respiratory:  clear to auscultation bilaterally, normal work of breathing GI: soft, nontender,  nondistended, + BS MS: no deformity or atrophy  Skin: warm and dry Neuro:  Strength and sensation are intact Psych: euthymic mood, full affect  EKG:  EKG is ordered today. Personal review of the ekg ordered shows sinus rhythm, rate 61  Recent Labs: 01/22/2016: BUN 12; Creatinine, Ser 0.81; Hemoglobin 12.9; Platelets 254; Potassium 4.0; Sodium 139    Lipid Panel  No results found for: CHOL, TRIG, HDL, CHOLHDL, VLDL, LDLCALC, LDLDIRECT   Wt Readings from Last 3 Encounters:  01/30/16 181 lb 3.2 oz (82.2 kg)  12/24/15 183 lb 6.4 oz (83.2 kg)  10/31/15 183 lb 12.8 oz (83.4 kg)      Other studies Reviewed: Additional studies/ records that were reviewed today include: PCP notes  TTE 11/21/15 - Left ventricle: The cavity size was normal. Wall thickness was   increased in a pattern of mild LVH. Systolic function was normal.   The estimated ejection fraction was in the range of 60% to 65%.   Doppler parameters are consistent with abnormal left ventricular   relaxation (grade 1 diastolic dysfunction). - Mitral valve: There was mild regurgitation.  ASSESSMENT AND PLAN:  1.  Atrial fibrillation: In sinus rhythm today. Currently on Eliquis. We will try and get the results of her most recent monitor. If her monitor is not revealing, she may need a second monitor to correlate symptoms with arrhythmias, as her symptoms have increased.  This patients CHA2DS2-VASc Score and unadjusted Ischemic Stroke Rate (% per year) is equal to 3.2 % stroke rate/year from a score of 3  Above score calculated as 1 point each if present [CHF, HTN, DM, Vascular=MI/PAD/Aortic Plaque, Age if 65-74, or Female] Above score calculated as 2 points each if present [Age > 75, or Stroke/TIA/TE]   2. Hypertension: Well controlled at home  3. Dizziness: Currently going through rehabilitation.  Current medicines are reviewed at length with the patient today.   The patient does not have concerns regarding her  medicines.  The following changes were made today:  none  Labs/ tests ordered today include:  No orders of the defined types were placed in this encounter.    Disposition:   FU with Will Camnitz  3 months  Signed, Will Meredith Leeds, MD  01/30/2016 11:08 AM     [facility] [address] [place] [address] [phone] (office) [phone] (fax)

## 2016-02-06 ENCOUNTER — Encounter: Payer: Self-pay | Admitting: Cardiology

## 2016-02-14 ENCOUNTER — Telehealth: Payer: Self-pay | Admitting: Cardiology

## 2016-02-14 NOTE — Telephone Encounter (Signed)
Pt would like to confirm that this office got her monitor results.     Please give her a call.

## 2016-02-14 NOTE — Telephone Encounter (Signed)
Informed pt that we have received monitor results and Dr. Curt Bears is reviewing them.  She understands I will call today/next week with findings.  She is agreeable to plan and is in no rush -- she just wanted to make sure we got the monitor.

## 2016-02-18 NOTE — Telephone Encounter (Signed)
Informed pt Dr. Curt Bears did not see anything concerning on monitor.  Advised NSR and to keep follow up next year with Camnitz. Patient verbalized understanding and agreeable to plan.

## 2016-02-28 DIAGNOSIS — H9122 Sudden idiopathic hearing loss, left ear: Secondary | ICD-10-CM

## 2016-02-28 HISTORY — DX: Sudden idiopathic hearing loss, left ear: H91.22

## 2016-03-08 DIAGNOSIS — I480 Paroxysmal atrial fibrillation: Secondary | ICD-10-CM | POA: Diagnosis not present

## 2016-03-08 DIAGNOSIS — K219 Gastro-esophageal reflux disease without esophagitis: Secondary | ICD-10-CM | POA: Diagnosis not present

## 2016-03-08 DIAGNOSIS — R079 Chest pain, unspecified: Secondary | ICD-10-CM

## 2016-03-08 DIAGNOSIS — D72829 Elevated white blood cell count, unspecified: Secondary | ICD-10-CM

## 2016-03-08 DIAGNOSIS — I10 Essential (primary) hypertension: Secondary | ICD-10-CM | POA: Diagnosis not present

## 2016-03-09 DIAGNOSIS — R079 Chest pain, unspecified: Secondary | ICD-10-CM | POA: Diagnosis not present

## 2016-03-09 DIAGNOSIS — I10 Essential (primary) hypertension: Secondary | ICD-10-CM | POA: Diagnosis not present

## 2016-03-09 DIAGNOSIS — I480 Paroxysmal atrial fibrillation: Secondary | ICD-10-CM | POA: Diagnosis not present

## 2016-03-09 DIAGNOSIS — I209 Angina pectoris, unspecified: Secondary | ICD-10-CM

## 2016-03-09 DIAGNOSIS — K219 Gastro-esophageal reflux disease without esophagitis: Secondary | ICD-10-CM | POA: Diagnosis not present

## 2016-03-09 HISTORY — DX: Angina pectoris, unspecified: I20.9

## 2016-03-30 DIAGNOSIS — I2510 Atherosclerotic heart disease of native coronary artery without angina pectoris: Secondary | ICD-10-CM

## 2016-03-30 DIAGNOSIS — E785 Hyperlipidemia, unspecified: Secondary | ICD-10-CM

## 2016-03-30 HISTORY — DX: Atherosclerotic heart disease of native coronary artery without angina pectoris: I25.10

## 2016-03-30 HISTORY — DX: Hyperlipidemia, unspecified: E78.5

## 2016-04-08 ENCOUNTER — Other Ambulatory Visit: Payer: Self-pay | Admitting: Otolaryngology

## 2016-04-08 DIAGNOSIS — H9121 Sudden idiopathic hearing loss, right ear: Secondary | ICD-10-CM

## 2016-04-14 ENCOUNTER — Ambulatory Visit
Admission: RE | Admit: 2016-04-14 | Discharge: 2016-04-14 | Disposition: A | Discharge status: Home / Self Care | Payer: Medicare Other | Source: Ambulatory Visit | Attending: Otolaryngology | Admitting: Otolaryngology

## 2016-04-14 DIAGNOSIS — H9121 Sudden idiopathic hearing loss, right ear: Secondary | ICD-10-CM

## 2016-04-14 MED ORDER — GADOBENATE DIMEGLUMINE 529 MG/ML IV SOLN
15.0000 mL | Freq: Once | INTRAVENOUS | Status: AC | PRN
  Administered 2016-04-14: 15 mL via INTRAVENOUS

## 2016-04-15 DIAGNOSIS — H903 Sensorineural hearing loss, bilateral: Secondary | ICD-10-CM

## 2016-04-15 DIAGNOSIS — K118 Other diseases of salivary glands: Secondary | ICD-10-CM

## 2016-04-15 HISTORY — DX: Sensorineural hearing loss, bilateral: H90.3

## 2016-04-15 HISTORY — DX: Other diseases of salivary glands: K11.8

## 2016-07-01 DIAGNOSIS — G510 Bell's palsy: Secondary | ICD-10-CM

## 2016-07-01 HISTORY — DX: Bell's palsy: G51.0

## 2016-07-03 ENCOUNTER — Other Ambulatory Visit: Payer: Self-pay | Admitting: Otolaryngology

## 2016-07-03 DIAGNOSIS — H9121 Sudden idiopathic hearing loss, right ear: Secondary | ICD-10-CM

## 2016-07-03 DIAGNOSIS — H9122 Sudden idiopathic hearing loss, left ear: Secondary | ICD-10-CM

## 2016-07-15 ENCOUNTER — Ambulatory Visit
Admission: RE | Admit: 2016-07-15 | Discharge: 2016-07-15 | Disposition: A | Discharge status: Home / Self Care | Payer: Medicare Other | Source: Ambulatory Visit | Attending: Otolaryngology | Admitting: Otolaryngology

## 2016-07-15 DIAGNOSIS — H9121 Sudden idiopathic hearing loss, right ear: Secondary | ICD-10-CM

## 2016-07-15 DIAGNOSIS — H9122 Sudden idiopathic hearing loss, left ear: Secondary | ICD-10-CM

## 2016-07-15 MED ORDER — GADOBENATE DIMEGLUMINE 529 MG/ML IV SOLN
16.0000 mL | Freq: Once | INTRAVENOUS | Status: AC | PRN
  Administered 2016-07-15: 16 mL via INTRAVENOUS

## 2016-07-16 ENCOUNTER — Other Ambulatory Visit: Payer: Self-pay | Admitting: Family Medicine

## 2016-07-17 ENCOUNTER — Telehealth: Payer: Self-pay | Admitting: Neurology

## 2016-07-17 NOTE — Telephone Encounter (Signed)
I got a call from Dr. Polly Cobia today. The patient has had onset of dizziness in November of 2017, she has since developed a left facial weakness, and a possible hemiparesis. She has enhancement of the left CN 7 and 8 nerves and some within the pons. She will likely need further blood work and LP to evaluate the etiology of the problem., She is progressively getting worse.

## 2016-07-20 NOTE — Telephone Encounter (Signed)
Called pt, appt scheduled Wed @ 11:30.

## 2016-07-20 NOTE — Telephone Encounter (Signed)
Set her up this week for follow up maybe at the end of the day Wednesday?

## 2016-07-22 ENCOUNTER — Ambulatory Visit (INDEPENDENT_AMBULATORY_CARE_PROVIDER_SITE_OTHER): Payer: Medicare Other | Admitting: Neurology

## 2016-07-22 ENCOUNTER — Encounter: Payer: Self-pay | Admitting: Neurology

## 2016-07-22 VITALS — BP 112/60 | HR 64 | Ht 61.0 in | Wt 179.4 lb

## 2016-07-22 DIAGNOSIS — D3610 Benign neoplasm of peripheral nerves and autonomic nervous system, unspecified: Secondary | ICD-10-CM

## 2016-07-22 DIAGNOSIS — G529 Cranial nerve disorder, unspecified: Secondary | ICD-10-CM | POA: Diagnosis not present

## 2016-07-22 DIAGNOSIS — C800 Disseminated malignant neoplasm, unspecified: Secondary | ICD-10-CM

## 2016-07-22 NOTE — Patient Instructions (Addendum)
Remember to drink plenty of fluid, eat healthy meals and do not skip any meals. Try to eat protein with a every meal and eat a healthy snack such as fruit or nuts in between meals. Try to keep a regular sleep-wake schedule and try to exercise daily, particularly in the form of walking, 20-30 minutes a day, if you can.   As far as diagnostic testing:   Lab testing CT of the chest, pelvis and abdomen Lumbar puncture  I would like to see you back in 4 weeks, sooner if we need to. Please call us with any interim questions, concerns, problems, updates or refill requests.   Our phone number is [phone]. We also have an after hours call service for urgent matters and there is a physician on-call for urgent questions. For any emergencies you know to call 911 or go to the nearest emergency room

## 2016-07-22 NOTE — [place] Notes (Signed)
[facility]    Provider:  Dr Jaynee Eagles Referring Provider: Ernestene Kiel, MD Primary Care Physician:  Ernestene Kiel, MD  CC:  dizziness  In November she lost hearing in the left ear and has a facial droop. MRi of the brain showed seventh and eighth cranial nerve enhancement with a mass that appears to be a schwannoma. She has no hearing in the left ear. Symptoms on the left side of the face started 1-2 months ago. The dizziness got better inititally but then in November she lost hearing. Her blood pressure was impaired and she was doing balance rehab.  She had an MI in January and she had a stent. In January she had left facial droop and numbness. She has been on steroids without relief. She is on Brillinta and an aspirin. She is having a lot of brusing and bleeding. She is in cardiac rehab. Patient's daughter provides most information. Personally reviewed images with patient and agree with the following.  MRI of the brain:  Personally reviewed imaging and agree with the following also reviewed images the patient: Brain: The midline structures are normal. There is no focal diffusion restriction to indicate acute infarct. The brain parenchymal signal is normal and there is no mass lesion. No intraparenchymal hematoma or chronic microhemorrhage. Brain volume is normal for age without age-advanced or lobar predominant atrophy. The dura is normal and there is no extra-axial collection.  Internal Auditory Canals: There is increased contrast enhancement of the cisternal segments of the left seventh and eighth cranial nerves. At the base of the nerves, there is a masslike area of enhancement that measures 6 x 6 mm. The contrast enhancement extends along the cisternal segments and into the cochlear that and semi circular canals. There is also abnormal contrast enhancement along the labyrinthine, tympanic and mastoid segments of the left facial nerve. The right internal auditory  canal and lower round venous structures are normal.  Vascular: Major intracranial arterial and venous sinus flow voids are preserved.  Skull and upper cervical spine: The visualized skull base, calvarium, upper cervical spine and extracranial soft tissues are normal.  Sinuses/Orbits: No fluid levels or advanced mucosal thickening. No mastoid effusion. Normal orbits.   HPI:  Kimberly Vaughn is a 71 y.o. female here as a referral from Dr. Laqueta Due for dizziness.  Past medical history of episodic atrial fibrillation, dizziness, balance problems, hypertension, degenerative disc disease, former smoker, hyperlipidemia, pulmonary nodule, kidney stones. Here with daughter who provides information. Started June 11th, she had acute onset dizziness, light headedness, worse with heat. She felt lightheaded and vision blurry. The room doesn't spin. Worse with standing and walking. Heat triggers it. She is also dizzy sitting, feels like a "bobble head", like she is on an ocean. Driving doesn't bother her. She feels "wavy" even when sitting. Always when she gets up. She drinks enough water. She feels like she wobbles. She loses her balance and starts leaning because she feels lightheaded, dizzy, like the blood is flowing out of the brain. She has a history of vertigo 8 years ago. Meclizine doesn't help with the symptoms. She was seen by ENT and workup was negative. Carotid studies were normal, she say a cardiologist, ENT and MRI of the brain was negative. She has been referred for balance therapy. She feels like she is underwater sometimes when sitting. Doesn;t happen in bed, better when laying down. No other associated symptoms or modifying factors.   Reviewed notes, labs and imaging from outside physicians, which showed:  Patient has chronic dizziness. Also diagnosis of episodic atrial fibrillation, balance problems.  Reviewed images  from [place]. CT and MRI:   CT of the neck with contrast  media. Normal no mass or swelling in the pharynx and married, normal salivary glands, thyroid normal, lymph nodes normal, vascular negative mild atherosclerosis, imitated intracranial negative, visualize orbits negative, no acute or aggressive process in the skeleton prior cervical fusion appears uncomplicated adjacent segment disease suspected above the C4 C6 fusion.  MRI of the head with and without contrast was negative, normal with mild age-appropriate volume loss, no cause of sudden onset dizziness and vertigo identified.  CMP with BUN 16 creatinine .77 09/19/2015   Review of Systems: Patient complains of symptoms per HPI as well as the following symptoms: palpitations, ringing in ears, birth marks, flushing. Pertinent negatives per HPI. All others negative.  Social History   Social History  . Marital status: Married    Spouse name: N/A  . Number of children: 1  . Years of education: 32   Occupational History  . Retired    Social History Main Topics  . Smoking status: Former Smoker    Types: Cigarettes    Quit date: 10/23/2005  . Smokeless tobacco: Never Used  . Alcohol use No  . Drug use: No  . Sexual activity: Not on file     Comment: MARRIED   Other Topics Concern  . Not on file   Social History Narrative   Lives with husband   Caffeine use: 2 cups coffee per day    Family History  Problem Relation Age of Onset  . Heart disease Mother 22  . Psoriasis Mother        PUSTULAR  . Heart attack Father 26  . Heart disease Father   . Lymphoma Sister   . Melanoma Brother   . Hypertension Daughter   . Lymphoma Other   . Osteoarthritis Other   . Melanoma Other   . Hypertension Other     Past Medical History:  Diagnosis Date  . A-fib ([place])   . DDD (degenerative disc disease), lumbar   . Degenerative joint disease   . Dizziness and giddiness   . Episodic atrial fibrillation ([place])   . Esophageal reflux   . GERD (gastroesophageal reflux disease)   . Hiatal  hernia   . Hypertension, essential   . Kidney stones   . Liver cyst LEFT  . Nodule of parotid gland   . Pulmonary nodule     Past Surgical History:  Procedure Laterality Date  . BREAST BIOPSY Bilateral   . CARPAL TUNNEL RELEASE Right 09/2013  . CATARACTS Bilateral   . CERVICAL SPINE SURGERY  04/2012  . FINGER SURGERY Right    TRIGGER FINGER RELEASE  . PARTIAL THYMECTOMY  1973  . TONSILLECTOMY  1964  . VAGINAL HYSTERECTOMY  1974    Current Outpatient Prescriptions  Medication Sig Dispense Refill  . aspirin EC 81 MG tablet Take 81 mg by mouth daily.    Marland Kitchen atorvastatin (LIPITOR) 80 MG tablet TK 1 T PO D  6  . BRILINTA 90 MG TABS tablet TK 1 T PO BID  6  . Cholecalciferol (VITAMIN D3) 2000 units TABS Take 2,000 Units by mouth daily.    . Cyanocobalamin (VITAMIN B-12) 5000 MCG SUBL Place under the tongue daily.    Marland Kitchen esomeprazole (NEXIUM) 40 MG packet Take 40 mg by mouth daily before breakfast.    . hydrochlorothiazide (HYDRODIURIL) 12.5 MG  tablet TK 1 T PO QD IN THE MORNING  6  . meclizine (ANTIVERT) 25 MG tablet Take 25 mg by mouth 3 (three) times daily as needed for dizziness.    . metoprolol tartrate (LOPRESSOR) 25 MG tablet Take 12.5 mg by mouth 2 (two) times daily.    . nitroGLYCERIN (NITROSTAT) 0.4 MG SL tablet Place 0.4 mg under the tongue every 5 (five) minutes as needed for chest pain.    . predniSONE (DELTASONE) 20 MG tablet   0   No current facility-administered medications for this visit.     Allergies as of 07/22/2016  . (No Known Allergies)    Vitals: BP 112/60   Pulse 64   Ht 5\' 1"  (1.549 m)   Wt 179 lb 6.4 oz (81.4 kg)   BMI 33.90 kg/m  Last Weight:  Wt Readings from Last 1 Encounters:  07/22/16 179 lb 6.4 oz (81.4 kg)   Last Height:   Ht Readings from Last 1 Encounters:  07/22/16 5\' 1"  (1.549 m)     Physical exam: Exam: Gen: NAD, conversant, well nourised, obese, well groomed                     CV: RRR, no MRG. No Carotid Bruits. No peripheral  edema, warm, nontender Eyes: Conjunctivae clear without exudates or hemorrhage  Neuro: Detailed Neurologic Exam  Speech:    Speech is normal; fluent and spontaneous with normal comprehension.  Cognition:    The patient is oriented to person, place, and time;     recent and remote memory intact;     language fluent;     normal attention, concentration,     fund of knowledge Cranial Nerves:    The pupils are equal, round, and reactive to light. The fundi are flat. Visual fields are full to finger confrontation. Extraocular movements are intact. Trigeminal sensation is intact and the muscles of mastication are normal. Left hearing loss and left facial droop with impaired left eye closure and bell's phenomenon.  The palate elevates in the midline. Hearing intact. Voice is normal. Shoulder shrug is normal. The tongue has normal motion without fasciculations.   Coordination:    Normal finger to nose and heel to shin.  Gait:    Not ataxic  Motor Observation:    No asymmetry, no atrophy, and no involuntary movements noted. Tone:    Normal muscle tone.    Posture:    Posture is normal. normal erect    Strength:    Strength is V/V in the upper and lower limbs.      Sensation: intact to LT     Reflex Exam:  DTR's:    Deep tendon reflexes in the upper and lower extremities are normal bilaterally.   Toes:    The toes are downgoing bilaterally.   Clonus:    Clonus is absent.  +Dix Hallpike positive with left head turning. Head thrust normal.  Left facial droop.    Assessment/Plan:  71 year old with dizziness. MRI of the brain showed enhancement of the seventh and eighth cranial nerves on the left with a mass of unclear etiology.   The differential for CN enhancement is quite wide including Herpes Lyme,  Gbs, hiv, Sarcoid, sjogren, Lumbar Puncture, rpr, TB, Fungus, Parasites, behcets, MS, Carcinomatosis, Leu, vestibulocochlear neuritis or vestibular schwannoma  Lab  testing today Lumbar puncture Pan scan to look for primary cancers NSY evaluation for surgical or biopsy considerations. F/u 4 weeks  Cc:  Dr. Laqueta Due, Dr. Erik Obey  Orders Placed This Encounter  Procedures  . CT CHEST W CONTRAST  . CT ABDOMEN PELVIS W CONTRAST  . DG FLUORO GUIDED LOC OF NEEDLE/CATH TIP FOR SPINAL INJECT LT  . B. burgdorfi Antibody  . HIV antibody  . Angiotensin converting enzyme  . Sjogren's syndrome antibods(ssa + ssb)  . ANA, IFA (with reflex)  . RPR  . Sedimentation rate  . Paraneoplastic Profile 1  . Multiple Myeloma Panel (SPEP&IFE w/QIG)  . Comprehensive metabolic panel  . CBC with Differential/Platelet  . Ambulatory referral to Neurosurgery     Sarina Ill, MD  [facility] [address] [place], [place] [address]  Phone [phone] Fax [phone]  A total of 40 minutes was spent face-to-face with this patient. Over half this time was spent on counseling patient on the cranial nerve enhancement and mass diagnosis and different diagnostic and therapeutic options available.

## 2016-07-23 ENCOUNTER — Ambulatory Visit
Admission: RE | Admit: 2016-07-23 | Discharge: 2016-07-23 | Disposition: A | Discharge status: Home / Self Care | Payer: Medicare Other | Source: Ambulatory Visit | Attending: Neurology | Admitting: Neurology

## 2016-07-23 DIAGNOSIS — G529 Cranial nerve disorder, unspecified: Secondary | ICD-10-CM

## 2016-07-23 DIAGNOSIS — C800 Disseminated malignant neoplasm, unspecified: Secondary | ICD-10-CM

## 2016-07-23 MED ORDER — IOPAMIDOL (ISOVUE-300) INJECTION 61%
100.0000 mL | Freq: Once | INTRAVENOUS | Status: AC | PRN
  Administered 2016-07-23: 100 mL via INTRAVENOUS

## 2016-07-28 LAB — COMPREHENSIVE METABOLIC PANEL
A/G RATIO: 2.4 — AB (ref 1.2–2.2)
ALBUMIN: 4.5 g/dL (ref 3.5–4.8)
ALK PHOS: 68 IU/L (ref 39–117)
ALT: 29 IU/L (ref 0–32)
AST: 14 IU/L (ref 0–40)
BILIRUBIN TOTAL: 0.7 mg/dL (ref 0.0–1.2)
BUN / CREAT RATIO: 29 — AB (ref 12–28)
BUN: 23 mg/dL (ref 8–27)
CHLORIDE: 100 mmol/L (ref 96–106)
CO2: 24 mmol/L (ref 18–29)
Calcium: 9.9 mg/dL (ref 8.7–10.3)
Creatinine, Ser: 0.8 mg/dL (ref 0.57–1.00)
GFR calc Af Amer: 85 mL/min/{1.73_m2} (ref >59)
GFR calc non Af Amer: 74 mL/min/{1.73_m2} (ref >59)
Globulin, Total: 1.9 g/dL (ref 1.5–4.5)
Glucose: 132 mg/dL — ABNORMAL HIGH (ref 65–99)
Potassium: 3.6 mmol/L (ref 3.5–5.2)
SODIUM: 140 mmol/L (ref 134–144)
TOTAL PROTEIN: 6.4 g/dL (ref 6.0–8.5)

## 2016-07-28 LAB — CBC WITH DIFFERENTIAL/PLATELET
BASOS ABS: 0 10*3/uL (ref 0.0–0.2)
Basos: 0 %
EOS (ABSOLUTE): 0 10*3/uL (ref 0.0–0.4)
Eos: 0 %
HEMATOCRIT: 38.5 % (ref 34.0–46.6)
Hemoglobin: 13.1 g/dL (ref 11.1–15.9)
Immature Grans (Abs): 0 10*3/uL (ref 0.0–0.1)
Immature Granulocytes: 0 %
LYMPHS ABS: 1.7 10*3/uL (ref 0.7–3.1)
Lymphs: 14 %
MCH: 33.2 pg — AB (ref 26.6–33.0)
MCHC: 34 g/dL (ref 31.5–35.7)
MCV: 98 fL — ABNORMAL HIGH (ref 79–97)
MONOS ABS: 0.1 10*3/uL (ref 0.1–0.9)
Monocytes: 1 %
NEUTROS ABS: 10 10*3/uL — AB (ref 1.4–7.0)
Neutrophils: 85 %
Platelets: 224 10*3/uL (ref 150–379)
RBC: 3.95 x10E6/uL (ref 3.77–5.28)
RDW: 14.1 % (ref 12.3–15.4)
WBC: 11.9 10*3/uL — AB (ref 3.4–10.8)

## 2016-07-28 LAB — FANA STAINING PATTERNS

## 2016-07-28 LAB — HIV ANTIBODY (ROUTINE TESTING W REFLEX): HIV SCREEN 4TH GENERATION: NONREACTIVE

## 2016-07-28 LAB — SJOGREN'S SYNDROME ANTIBODS(SSA + SSB)
ENA SSA (RO) Ab: 1.3 AI — ABNORMAL HIGH (ref 0.0–0.9)
ENA SSB (LA) Ab: <0.2 AI (ref 0.0–0.9)

## 2016-07-28 LAB — ANGIOTENSIN CONVERTING ENZYME: ANGIO CONVERT ENZYME: 23 U/L (ref 14–82)

## 2016-07-28 LAB — RPR: RPR Ser Ql: NONREACTIVE

## 2016-07-28 LAB — MULTIPLE MYELOMA PANEL, SERUM
ALPHA 1: 0.3 g/dL (ref 0.0–0.4)
ALPHA2 GLOB SERPL ELPH-MCNC: 0.8 g/dL (ref 0.4–1.0)
Albumin SerPl Elph-Mcnc: 3.8 g/dL (ref 2.9–4.4)
Albumin/Glob SerPl: 1.5 (ref 0.7–1.7)
B-GLOBULIN SERPL ELPH-MCNC: 1.2 g/dL (ref 0.7–1.3)
GLOBULIN, TOTAL: 2.6 g/dL (ref 2.2–3.9)
Gamma Glob SerPl Elph-Mcnc: 0.4 g/dL (ref 0.4–1.8)
IgA/Immunoglobulin A, Serum: 192 mg/dL (ref 64–422)
IgG (Immunoglobin G), Serum: 453 mg/dL — ABNORMAL LOW (ref 700–1600)
IgM (Immunoglobulin M), Srm: 49 mg/dL (ref 26–217)

## 2016-07-28 LAB — PARANEOPLASTIC PROFILE 1

## 2016-07-28 LAB — B. BURGDORFI ANTIBODIES

## 2016-07-28 LAB — SEDIMENTATION RATE: Sed Rate: 2 mm/hr (ref 0–40)

## 2016-07-28 LAB — ANTINUCLEAR ANTIBODIES, IFA: ANA TITER 1: POSITIVE — AB

## 2016-07-29 ENCOUNTER — Telehealth: Payer: Self-pay | Admitting: *Deleted

## 2016-07-29 NOTE — Telephone Encounter (Signed)
Lets hold off on LP thanks. Did we get the [place] notes?

## 2016-07-29 NOTE — Telephone Encounter (Addendum)
Called and spoke with pt about CT abdomen/pelvis and lab results per AA,MD note. Pt verbalized understanding.   She wanted to let us know she saw neurosurgeon. He is wanting to wait at least 2 more months before surgery since she just had stent placed this past January. She is on blood thinners. Cardiologist and neurosurgeon recommended she stay on medication at least 6 months before stopping for any reason. She wants to know if she should hold off on LP. She spoke with Angelita Ingles at [facility] imaging who advised she cannot have LP unless she goes off blood thinner. Advised I will send to AA,MD to advise.

## 2016-07-29 NOTE — Telephone Encounter (Signed)
-----   Message from Melvenia Beam, MD sent at 07/28/2016  8:21 AM EDT ----- Chest abdomen and pelvis are negative for any evidence of malignancy, and negative for any acute process. Still waiting on all her labs to come back thanks

## 2016-07-30 NOTE — Telephone Encounter (Signed)
Called pt to let her know plans to hold off on LP for now. Pt saw neurosurgeon yesterday, notes to follow.

## 2016-08-17 ENCOUNTER — Encounter: Payer: Self-pay | Admitting: Neurology

## 2016-08-17 ENCOUNTER — Ambulatory Visit (INDEPENDENT_AMBULATORY_CARE_PROVIDER_SITE_OTHER): Payer: Medicare Other | Admitting: Neurology

## 2016-08-17 VITALS — BP 128/74 | HR 60 | Ht 61.0 in | Wt 178.2 lb

## 2016-08-17 DIAGNOSIS — G529 Cranial nerve disorder, unspecified: Secondary | ICD-10-CM | POA: Diagnosis not present

## 2016-08-17 NOTE — [place] Notes (Signed)
[facility]    Provider:  Dr Jaynee Eagles Referring Provider: Ernestene Kiel, MD Primary Care Physician:  Ernestene Kiel, MD  CC: dizziness  Interval history 08/17/2016: 71 year old with dizziness. MRI of the brain showed enhancement of the seventh and eighth cranial nerves on the left with a mass of unclear etiology. CT Chest, abdomen and pelvis were negative for any evidence of malignancy.  Patient was evaluated by neurosurgery and they're holding off on surgery carotid artery disease involving stenting to the right coronary artery in January 2018. She is on dual antiplatelet agents at this time. I did not receive NSy notes. MRi is this Thursday. She saw dr. Kathyrn Sheriff. Her left-sided weaknes is worse. She can;t close her eye, She is going to the eye doctor her eye is getting dry. She had a stent  Placed in her heart. She is using eye drops. She has repeat MRI pending. Here with daughter who also provides information.   Reviewed neurosurgery notes. 71 year old female with rapid onset of severe left face droop and complete loss of hearing in the left. Nodular enhancement at the brain stem of the seventh and eighth cranial nerve complex as well as enhancement of the nerve in the canal. Unclear with the enhancement represents. She could have labyrinthitis however the nodular enhancement at the entry zone would be unusual. Alternatively she could have a nerve sheath tumor. MRI of the brain will be repeated. She is on antiplatelet therapy, dual, due to recent drug-eluting stent but if MRI demonstrates significant growth they may have to operate before the 6 month period and come off the dual antiplatelet therapy.  07/22/2016: In November she lost hearing in the left ear and has a facial droop. MRi of the brain showed seventh and eighth cranial nerve enhancement with a mass that appears to be a schwannoma. She has no hearing in the left ear. Symptoms on the left side of the face  started 1-2 months ago. The dizziness got better inititally but then in November she lost hearing. Her blood pressure was impaired and she was doing balance rehab.  She had an MI in January and she had a stent. In January she had left facial droop and numbness. She has been on steroids without relief. She is on Brillinta and an aspirin. She is having a lot of brusing and bleeding. She is in cardiac rehab. Patient's daughter provides most information. Personally reviewed images with patient and agree with the following.  MRI of the brain:  Personally reviewed imaging and agree with the following also reviewed images the patient: Brain: The midline structures are normal. There is no focal diffusion restriction to indicate acute infarct. The brain parenchymal signal is normal and there is no mass lesion. No intraparenchymal hematoma or chronic microhemorrhage. Brain volume is normal for age without age-advanced or lobar predominant atrophy. The dura is normal and there is no extra-axial collection.  Internal Auditory Canals: There is increased contrast enhancement of the cisternal segments of the left seventh and eighth cranial nerves. At the base of the nerves, there is a masslike area of enhancement that measures 6 x 6 mm. The contrast enhancement extends along the cisternal segments and into the cochlear that and semi circular canals. There is also abnormal contrast enhancement along the labyrinthine, tympanic and mastoid segments of the left facial nerve. The right internal auditory canal and lower round venous structures are normal.  Vascular: Major intracranial arterial and venous sinus flow voids are preserved.  Skull  and upper cervical spine: The visualized skull base, calvarium, upper cervical spine and extracranial soft tissues are normal.  Sinuses/Orbits: No fluid levels or advanced mucosal thickening. No mastoid effusion. Normal orbits.   EVO:JJKKX Hamletis a 71 y.o.femalehere as  a referral from Dr. Sharee Holster dizziness. Past medical history of episodic atrial fibrillation, dizziness, balance problems, hypertension, degenerative disc disease, former smoker, hyperlipidemia, pulmonary nodule, kidney stones. Here with daughter who provides information. Started June 11th, she had acute onset dizziness, light headedness, worse with heat. She felt lightheaded and vision blurry. The room doesn't spin. Worse with standing and walking. Heat triggers it. She is also dizzy sitting, feels like a "bobble head", like she is on an ocean. Driving doesn't bother her. She feels "wavy" even when sitting. Always when she gets up. She drinks enough water. She feels like she wobbles. She loses her balance and starts leaning because she feels lightheaded, dizzy, like the blood is flowing out of the brain. She has a history of vertigo 8 years ago. Meclizine doesn't help with the symptoms. She was seen by ENT and workup was negative. Carotid studies were normal, she say a cardiologist, ENT and MRI of the brain was negative. She has been referred for balance therapy. She feels like she is underwater sometimes when sitting. Doesn;t happen in bed, better when laying down. No other associated symptoms or modifying factors.   Reviewed notes, labs and imaging from outside physicians, which showed: Patient has chronic dizziness. Also diagnosis of episodic atrial fibrillation, balance problems.  Reviewed images from [place]. CT and MRI:   CT of the neck with contrast media. Normal no mass or swelling in the pharynx and married, normal salivary glands, thyroid normal, lymph nodes normal, vascular negative mild atherosclerosis, imitated intracranial negative, visualize orbits negative, no acute or aggressive process in the skeleton prior cervical fusion appears uncomplicated adjacent segment disease suspected above the C4 C6 fusion.  MRI of the head with and without contrast was negative,  normal with mild age-appropriate volume loss, no cause of sudden onset dizziness and vertigo identified.  CMP with BUN 16 creatinine .77 09/19/2015   Review of Systems: Patient complains of symptoms per HPI as well as the following symptoms: palpitations, ringing in ears, birth marks, flushing. Pertinent negatives per HPI. All others negative.  Social History   Social History  . Marital status: Married    Spouse name: N/A  . Number of children: 1  . Years of education: 14   Occupational History  . Retired    Social History Main Topics  . Smoking status: Former Smoker    Types: Cigarettes    Quit date: 10/23/2005  . Smokeless tobacco: Never Used  . Alcohol use No  . Drug use: No  . Sexual activity: Not on file     Comment: MARRIED   Other Topics Concern  . Not on file   Social History Narrative   Lives with husband   Caffeine use: 2 cups coffee per day    Family History  Problem Relation Age of Onset  . Heart disease Mother 52  . Psoriasis Mother        PUSTULAR  . Heart attack Father 44  . Heart disease Father   . Lymphoma Sister   . Melanoma Brother   . Hypertension Daughter   . Lymphoma Other   . Osteoarthritis Other   . Melanoma Other   . Hypertension Other     Past Medical History:  Diagnosis Date  .  A-fib ([place])   . DDD (degenerative disc disease), lumbar   . Degenerative joint disease   . Dizziness and giddiness   . Episodic atrial fibrillation ([place])   . Esophageal reflux   . GERD (gastroesophageal reflux disease)   . Hiatal hernia   . Hypertension, essential   . Kidney stones   . Liver cyst LEFT  . Nodule of parotid gland   . Pulmonary nodule     Past Surgical History:  Procedure Laterality Date  . BREAST BIOPSY Bilateral   . CARPAL TUNNEL RELEASE Right 09/2013  . CATARACTS Bilateral   . CERVICAL SPINE SURGERY  04/2012  . FINGER SURGERY Right    TRIGGER FINGER RELEASE  . PARTIAL THYMECTOMY  1973  . TONSILLECTOMY  1964  . VAGINAL  HYSTERECTOMY  1974    Current Outpatient Prescriptions  Medication Sig Dispense Refill  . aspirin EC 81 MG tablet Take 81 mg by mouth daily.    Marland Kitchen atorvastatin (LIPITOR) 80 MG tablet TK 1 T PO D  6  . Cholecalciferol (VITAMIN D3) 2000 units TABS Take 2,000 Units by mouth daily.    . Cyanocobalamin (VITAMIN B-12) 5000 MCG SUBL Place under the tongue daily.    Marland Kitchen esomeprazole (NEXIUM) 40 MG packet Take 40 mg by mouth daily before breakfast.    . hydrochlorothiazide (HYDRODIURIL) 12.5 MG tablet TK 1 T PO QD IN THE MORNING  6  . meclizine (ANTIVERT) 25 MG tablet Take 25 mg by mouth 3 (three) times daily as needed for dizziness.    . metoprolol tartrate (LOPRESSOR) 25 MG tablet Take 12.5 mg by mouth 2 (two) times daily.    . nitroGLYCERIN (NITROSTAT) 0.4 MG SL tablet Place 0.4 mg under the tongue every 5 (five) minutes as needed for chest pain.    Marland Kitchen BRILINTA 90 MG TABS tablet TK 1 T PO BID  6  . predniSONE (DELTASONE) 20 MG tablet   0   No current facility-administered medications for this visit.     Allergies as of 08/17/2016  . (No Known Allergies)    Vitals: BP 128/74   Pulse 60   Ht 5\' 1"  (1.549 m)   Wt 178 lb 3.2 oz (80.8 kg)   BMI 33.67 kg/m  Last Weight:  Wt Readings from Last 1 Encounters:  08/17/16 178 lb 3.2 oz (80.8 kg)   Last Height:   Ht Readings from Last 1 Encounters:  08/17/16 5\' 1"  (1.549 m)   Worsening left facial droop, intact sensation, inability to lift eyebrows on the left, inability to fully close left eye with Bell's phenomena, intact sensation in the face, absent hearing left ear. Strength intact, vision intact, sensation intact, no dysmetria, no involuntary movements seen, gait without ataxia, extraocular movements intact.   Assessment/Plan:71 year old with dizziness. MRI of the brain showed enhancement of the seventh and eighth cranial nerves on the left with a mass of unclear etiology.   The differential for CN enhancement is quite wide including  Herpes Lyme,  Gbs, hiv, Sarcoid, sjogren, Lumbar Puncture, rpr, TB, Fungus, Parasites, behcets, MS, Carcinomatosis, Leu, vestibulocochlear neuritis or vestibular schwannoma  Discussed Eyecare, making sure to keep VI hydrated with preservative-free eyedrops or gel, wearing eyeglasses protection, worry some is corneal drying and corneal ulceration with risk for vision loss.  They are going to see neurosurgery this Thursday with repeat MRI and follow up with Nundkumar due to worsening.  Lab testing today was unremarkable Pan scan to look for primary cancers: negative  NSY evaluation for surgical or biopsy considerations: having repeat MRI Thursday and follow up that day with Dr. Kathyrn Sheriff F/u 8 weeks   Cc: Dr. Laqueta Due, Dr. Erik Obey    Sarina Ill, MD  [facility] [address] [place], [place] [address]  Phone [phone] Fax [phone]  A total of 25 minutes was spent face-to-face with this patient. Over half this time was spent on counseling patient on the cranial nerve enhancement, hearing loss, cranial nerve palsy diagnosis and different diagnostic and therapeutic options available.

## 2016-08-17 NOTE — [place] Notes (Signed)
Dr. Agustin Cree, [facility] [place], [place] T # [phone]

## 2016-08-20 ENCOUNTER — Other Ambulatory Visit: Payer: Self-pay | Admitting: Neurosurgery

## 2016-08-21 ENCOUNTER — Other Ambulatory Visit (HOSPITAL_COMMUNITY): Payer: Self-pay | Admitting: Neurosurgery

## 2016-08-21 DIAGNOSIS — D496 Neoplasm of unspecified behavior of brain: Secondary | ICD-10-CM

## 2016-08-27 ENCOUNTER — Ambulatory Visit (HOSPITAL_COMMUNITY)
Admission: RE | Admit: 2016-08-27 | Discharge: 2016-08-27 | Disposition: A | Discharge status: Home / Self Care | Payer: Medicare Other | Source: Ambulatory Visit | Attending: Neurosurgery | Admitting: Neurosurgery

## 2016-08-27 DIAGNOSIS — D496 Neoplasm of unspecified behavior of brain: Secondary | ICD-10-CM | POA: Diagnosis not present

## 2016-08-28 ENCOUNTER — Encounter (HOSPITAL_COMMUNITY): Payer: Self-pay

## 2016-08-28 ENCOUNTER — Encounter (HOSPITAL_COMMUNITY)
Admission: RE | Admit: 2016-08-28 | Discharge: 2016-08-28 | Disposition: A | Discharge status: Home / Self Care | Payer: Medicare Other | Source: Ambulatory Visit | Attending: Neurosurgery | Admitting: Neurosurgery

## 2016-08-28 DIAGNOSIS — Z01812 Encounter for preprocedural laboratory examination: Secondary | ICD-10-CM | POA: Diagnosis present

## 2016-08-28 HISTORY — DX: Unspecified asthma, uncomplicated: J45.909

## 2016-08-28 HISTORY — DX: Other specified postprocedural states: Z98.890

## 2016-08-28 HISTORY — DX: Pneumonia, unspecified organism: J18.9

## 2016-08-28 HISTORY — DX: Acute myocardial infarction, unspecified: I21.9

## 2016-08-28 HISTORY — DX: Angina pectoris, unspecified: I20.9

## 2016-08-28 HISTORY — DX: Cardiac arrhythmia, unspecified: I49.9

## 2016-08-28 HISTORY — DX: Personal history of urinary calculi: Z87.442

## 2016-08-28 HISTORY — DX: Atherosclerotic heart disease of native coronary artery without angina pectoris: I25.10

## 2016-08-28 HISTORY — DX: Other specified postprocedural states: R11.2

## 2016-08-28 LAB — BASIC METABOLIC PANEL
Anion gap: 10 (ref 5–15)
BUN: 10 mg/dL (ref 6–20)
CALCIUM: 9.9 mg/dL (ref 8.9–10.3)
CO2: 26 mmol/L (ref 22–32)
Chloride: 100 mmol/L — ABNORMAL LOW (ref 101–111)
Creatinine, Ser: 0.77 mg/dL (ref 0.44–1.00)
GFR calc Af Amer: >60 mL/min (ref >60)
GLUCOSE: 110 mg/dL — AB (ref 65–99)
Potassium: 3 mmol/L — ABNORMAL LOW (ref 3.5–5.1)
Sodium: 136 mmol/L (ref 135–145)

## 2016-08-28 LAB — ABO/RH: ABO/RH(D): O POS

## 2016-08-28 LAB — CBC
HCT: 38.5 % (ref 36.0–46.0)
HEMOGLOBIN: 13.6 g/dL (ref 12.0–15.0)
MCH: 33.9 pg (ref 26.0–34.0)
MCHC: 35.3 g/dL (ref 30.0–36.0)
MCV: 96 fL (ref 78.0–100.0)
Platelets: 295 10*3/uL (ref 150–400)
RBC: 4.01 MIL/uL (ref 3.87–5.11)
RDW: 13.3 % (ref 11.5–15.5)
WBC: 11.4 10*3/uL — ABNORMAL HIGH (ref 4.0–10.5)

## 2016-08-28 NOTE — [place] Notes (Addendum)
PCP is Dr. Ernestene Kiel Cardiologist is Dr. Agustin Cree has an appointment July 3 ENT Dr Gardner Candle Dr.  Sarina Ill [facility]) Reports she will take last dose of Brlinta will be July 2 PT ordered for day of surgery.  Last dose of aspirin was 08-27-16 States she had stents placed in [facility] 03-09-16 Dr Atilano Median placed the stents Heart cath 03-09-16 Stress test 2016 Echo 06-09-16 Request sent to Dr Agustin Cree for last office visit, EKG tracing, and any heart studies.

## 2016-08-28 NOTE — Pre-Procedure Instructions (Signed)
Kimberly Vaughn  08/28/2016      [facility] - [address], [place] - [address] [place] [address] Phone: [phone] Fax: [phone]    Your procedure is scheduled on July 6  Report to [place] at 830 A.M.  Call this number if you have problems the morning of surgery:  [phone]   Remember:  Do not eat food or drink liquids after midnight.  Take these medicines the morning of surgery with A SIP OF WATER Esomeprazole (Nexium), Metoprolol tartrate (Lopressor), Nitrostat if needed  Stop taking BC's, Goody's, Herbal medications, Fish Oil, Ibuprofen, Advil, Motrin, Aleve  Stop taking aspirin, and Brilinta as directed by your Dr.    Lazaro Arms not wear jewelry, make-up or nail polish.  Do not wear lotions, powders, or perfumes, or deoderant.  Do not shave 48 hours prior to surgery.  Men may shave face and neck.  Do not bring valuables to the hospital.  [facility] is not responsible for any belongings or valuables.  Contacts, dentures or bridgework may not be worn into surgery.  Leave your suitcase in the car.  After surgery it may be brought to your room.  For patients admitted to the hospital, discharge time will be determined by your treatment team.  Patients discharged the day of surgery will not be allowed to drive home.   Special instructions:  [place] - Preparing for Surgery  Before surgery, you can play an important role.  Because skin is not sterile, your skin needs to be as free of germs as possible.  You can reduce the number of germs on you skin by washing with CHG (chlorahexidine gluconate) soap before surgery.  CHG is an antiseptic cleaner which kills germs and bonds with the skin to continue killing germs even after washing.  Please DO NOT use if you have an allergy to CHG or antibacterial soaps.  If your skin becomes reddened/irritated stop using the CHG  and inform your nurse when you arrive at Short Stay.  Do not shave (including legs and underarms) for at least 48 hours prior to the first CHG shower.  You may shave your face.  Please follow these instructions carefully:   1.  Shower with CHG Soap the night before surgery and the                                morning of Surgery.  2.  If you choose to wash your hair, wash your hair first as usual with your       normal shampoo.  3.  After you shampoo, rinse your hair and body thoroughly to remove the                      Shampoo.  4.  Use CHG as you would any other liquid soap.  You can apply chg directly       to the skin and wash gently with scrungie or a clean washcloth.  5.  Apply the CHG Soap to your body ONLY FROM THE NECK DOWN.        Do not use on open wounds or open sores.  Avoid contact with your eyes,       ears, mouth and genitals (private parts).  Wash genitals (private parts)  with your normal soap.  6.  Wash thoroughly, paying special attention to the area where your surgery        will be performed.  7.  Thoroughly rinse your body with warm water from the neck down.  8.  DO NOT shower/wash with your normal soap after using and rinsing off       the CHG Soap.  9.  Pat yourself dry with a clean towel.            10.  Wear clean pajamas.            11.  Place clean sheets on your bed the night of your first shower and do not        sleep with pets.  Day of Surgery  Do not apply any lotions/deoderants the morning of surgery.  Please wear clean clothes to the hospital/surgery center.     Please read over the following fact sheets that you were given. Pain Booklet, Coughing and Deep Breathing, MRSA Information and Surgical Site Infection Prevention

## 2016-08-31 ENCOUNTER — Ambulatory Visit: Payer: Medicare Other | Admitting: Cardiology

## 2016-08-31 NOTE — Progress Notes (Signed)
Anesthesia Chart Review:  Pt is a 71 year old female scheduled for retrosigmoid craniectomy for tumor resection with brainlab on 09/04/2016 with Consuella Lose, MD  - PCP is Ernestene Kiel, MD - Neurologist is Sarina Ill, MD - Cardiologist is Jenne Campus, MD; pt has appt 09/01/16.  Old notes can be found in care everywhere   PMH includes:  CAD (DES to RCA 03/09/16), atrial fibrillation, HTN, pulmonary nodule, parotid gland nodule, liver cyst, post-op N/V, GERD.  Former smoker. BMI 33  Medications include: ASA 81mg , lipitor, brilinta, nexium, hctz, metoprolol.  Last dose ASA 08/27/16. Last dose brilinta 08/31/16.   Preoperative labs reviewed.    CT abd/pelvis, chest 07/24/16:  1. Chest abdomen and pelvis are negative for any evidence of malignancy, and negative for any acute process. 2. Chronic benign appearing left posterosuperior mediastinal complex cyst is indeterminate for bronchial/enteric versus neurogenic origin but is unchanged since 2016 and appears inconsequential. 3. Scattered small pulmonary nodules are stable for two years and benign. Similar benign low-density areas in the liver, likely cysts or hemangiomas. 4.  Calcified aortic atherosclerosis. 5. Diverticulosis of the colon without active inflammation.  CXR 01/22/16: No acute cardiopulmonary process.  EKG 01/22/16: NSR.   Echo 06/09/16 [facility] cardiology [place]): 1. Normal global wall motion. Visual EF 55-60%. Doppler evidence of grade I diastolic dysfunction. Calculated EF 55%. 2. Structurally normal mitral valve with trace regurgitation. 3. Structurally normal tricuspid valve with mild regurgitation. 4. Structurally normal pulmonic valve trace regurgitation.  Cardiac event monitor  05/05/16 (care everywhere):  1.abnormal event monitor.  2.Mild arrhythmiaas described: occas PVCs and runs of bigeminy (bigeminy detected x 2 on automated recording, no sx) 3.Symptoms were reported : CP x1,During NSR,  no ST shift, "flutter" x1:Isolated PVC 4.Symptoms were loosely correlated with arrhythmias  Cardiac cath 03/09/16 (care everywhere):  - Normal LV function - LMCA: 0%. - LAD: 0%. - LCx: 0%. - RCA: Lesion on Mid RCA: Mid subsection. 95% stenosis 12 mm length reduced to 0% with PCI/DES to RCA.  If no changes, I anticipate pt can proceed with surgery as scheduled.   Willeen Cass, FNP-BC [facility] Short Stay Surgical Center/Anesthesiology Phone: [phone] 08/31/2016 12:07 PM

## 2016-09-01 ENCOUNTER — Ambulatory Visit (INDEPENDENT_AMBULATORY_CARE_PROVIDER_SITE_OTHER): Payer: Medicare Other | Admitting: Cardiology

## 2016-09-01 ENCOUNTER — Encounter: Payer: Self-pay | Admitting: Cardiology

## 2016-09-01 DIAGNOSIS — I10 Essential (primary) hypertension: Secondary | ICD-10-CM | POA: Diagnosis not present

## 2016-09-01 DIAGNOSIS — I480 Paroxysmal atrial fibrillation: Secondary | ICD-10-CM

## 2016-09-01 DIAGNOSIS — I2510 Atherosclerotic heart disease of native coronary artery without angina pectoris: Secondary | ICD-10-CM

## 2016-09-01 HISTORY — DX: Essential (primary) hypertension: I10

## 2016-09-01 HISTORY — DX: Paroxysmal atrial fibrillation: I48.0

## 2016-09-01 NOTE — Progress Notes (Signed)
Cardiology Office Note:    Date:  09/01/2016   ID:  Kimberly Vaughn, DOB 1945-06-17, MRN [id number]  PCP:  Ernestene Kiel, MD  Cardiologist:  Jenne Campus, MD    Referring MD: Ernestene Kiel, MD   Chief Complaint  Patient presents with  . Follow-up  Coronary artery disease  History of Present Illness:    Kimberly Vaughn is a 71 y.o. female  with coronary artery disease drug-eluting stent implantation to right coronary artery in January 2018. She was find to have a brain tumor and required brain surgery. Surgery scheduled for this Friday. Overall clinically from cardiac standpoint of view she is doing well. He does have any chest pain, tightness, pressure burning the chest. She did have drug-eluting stent implanted more than 6 months ago. However since this is a new generation stent I think we can safely discontinue her Brelinta time for surgery.  Past Medical History:  Diagnosis Date  . A-fib ([place])   . Anginal pain ([place])   . Asthma    as a child  . Coronary artery disease   . DDD (degenerative disc disease), lumbar   . Degenerative joint disease   . Dizziness and giddiness   . Dysrhythmia    A fib  . Episodic atrial fibrillation ([place])   . Esophageal reflux   . GERD (gastroesophageal reflux disease)   . Hiatal hernia   . History of kidney stones   . Hypertension, essential   . Kidney stones   . Liver cyst LEFT  . Myocardial infarction ([place])   . Nodule of parotid gland   . Pneumonia   . PONV (postoperative nausea and vomiting)   . Pulmonary nodule     Past Surgical History:  Procedure Laterality Date  . BREAST BIOPSY Bilateral   . CARPAL TUNNEL RELEASE Right 09/2013  . CATARACTS Bilateral   . CERVICAL SPINE SURGERY  04/2012  . FINGER SURGERY Right    TRIGGER FINGER RELEASE  . PARTIAL THYMECTOMY  1973   pt states doesn't remember this being done  . TONSILLECTOMY  1964  . VAGINAL HYSTERECTOMY  1974    Current Medications: Current Meds  Medication Sig   . atorvastatin (LIPITOR) 80 MG tablet TAKE 1 TABLET BY MOUTH IN THE AFTERNOON  . Cholecalciferol (VITAMIN D3) 2000 units TABS Take 2,000 Units by mouth daily.  . Cyanocobalamin (VITAMIN B-12) 5000 MCG SUBL Place 5,000 mcg under the tongue daily.   Marland Kitchen esomeprazole (NEXIUM) 40 MG capsule Take 40 mg by mouth daily.  . hydrochlorothiazide (HYDRODIURIL) 12.5 MG tablet TAKE 1 TABLET BY MOUTH EVERY MORNING  . meclizine (ANTIVERT) 25 MG tablet Take 25 mg by mouth 3 (three) times daily as needed for dizziness.  . metoprolol tartrate (LOPRESSOR) 25 MG tablet Take 25 mg by mouth 2 (two) times daily.   . nitroGLYCERIN (NITROSTAT) 0.4 MG SL tablet Place 0.4 mg under the tongue every 5 (five) minutes as needed for chest pain.     Allergies:   Patient has no known allergies.   Social History   Social History  . Marital status: Married    Spouse name: N/A  . Number of children: 1  . Years of education: 60   Occupational History  . Retired    Social History Main Topics  . Smoking status: Former Smoker    Types: Cigarettes    Quit date: 10/23/2005  . Smokeless tobacco: Never Used  . Alcohol use No  . Drug use: No  .  Sexual activity: Not Asked     Comment: MARRIED   Other Topics Concern  . None   Social History Narrative   Lives with husband   Caffeine use: 2 cups coffee per day     Family History: The patient's family history includes Heart attack (age of onset: 36) in her father; Heart disease in her father; Heart disease (age of onset: 32) in her mother; Hypertension in her daughter and other; Lymphoma in her other and sister; Melanoma in her brother and other; Osteoarthritis in her other; Psoriasis in her mother. ROS:   Please see the history of present illness.     All other systems reviewed and are negative.  EKGs/Labs/Other Studies Reviewed:      Recent Labs: 07/22/2016: ALT 29 08/28/2016: BUN 10; Creatinine, Ser 0.77; Hemoglobin 13.6; Platelets 295; Potassium 3.0; Sodium 136    Recent Lipid Panel No results found for: CHOL, TRIG, HDL, CHOLHDL, VLDL, LDLCALC, LDLDIRECT  Physical Exam:    VS:  BP 110/60   Pulse 76   Resp 10   Ht 5\' 3"  (1.6 m)   Wt 169 lb (76.7 kg)   BMI 29.94 kg/m     Wt Readings from Last 3 Encounters:  09/01/16 169 lb (76.7 kg)  08/28/16 173 lb 8 oz (78.7 kg)  08/17/16 178 lb 3.2 oz (80.8 kg)     GEN:  Well nourished, well developed in no acute distress HEENT: Normal NECK: No JVD; No carotid bruits LYMPHATICS: No lymphadenopathy CARDIAC: RRR, no murmurs, no rubs, no gallops RESPIRATORY:  Clear to auscultation without rales, wheezing or rhonchi  ABDOMEN: Soft, non-tender, non-distended MUSCULOSKELETAL:  No edema; No deformity  SKIN: Warm and dry LOWER EXTREMITIES: no swelling NEUROLOGIC:  Alert and oriented x 3 PSYCHIATRIC:  Normal affect   ASSESSMENT:    1. Coronary artery disease involving native coronary artery of native heart without angina pectoris   2. Paroxysmal atrial fibrillation (HCC)   3. Essential hypertension    PLAN:    In order of problems listed above:  1. Preop evaluation for this lady with brain tumor. She is stable from cardiac standpoint of view to perform surgery as scheduled. We can discontinue dual antiplatelet therapy. It is already more than 6 months after her stent implantation. Stent was implanted not in the face of acute myocardial infarction. 2. Paroxysmal atrial fibrillation: Denies having recent episodes. Seems to maintaining sinus rhythm will continue present management. 3. Essential hypertension blood pressure well-controlled continue present management.  Medication Adjustments/Labs and Tests Ordered: Current medicines are reviewed at length with the patient today.  Concerns regarding medicines are outlined above.  No orders of the defined types were placed in this encounter.  Medication changes: No orders of the defined types were placed in this encounter.   Signed, Jenne Campus,  MD  09/01/2016 4:04 PM    [facility]

## 2016-09-01 NOTE — Patient Instructions (Addendum)
Medication Instructions:  Your physician recommends that you continue on your current medications as directed. Please refer to the Current Medication list given to you today. You may hold the Brilinta as discussed.    Labwork: none  Testing/Procedures: none  Follow-Up: Your physician recommends that you schedule a follow-up appointment in: 3 months     Any Other Special Instructions Will Be Listed Below (If Applicable).     If you need a refill on your cardiac medications before your next appointment, please call your pharmacy.

## 2016-09-03 NOTE — Anesthesia Preprocedure Evaluation (Addendum)
Anesthesia Evaluation  Patient identified by MRN, date of birth, ID band Patient awake    Reviewed: Allergy & Precautions, H&P , Patient's Chart, lab work & pertinent test results, reviewed documented beta blocker date and time   History of Anesthesia Complications (+) PONV and history of anesthetic complications  Airway Mallampati: II  TM Distance: >3 FB Neck ROM: full    Dental no notable dental hx. (+) Dental Advisory Given, Edentulous Upper, Edentulous Lower   Pulmonary former smoker,    Pulmonary exam normal breath sounds clear to auscultation       Cardiovascular hypertension, + CAD, + Past MI and + Cardiac Stents   Rhythm:regular Rate:Normal     Neuro/Psych    GI/Hepatic GERD  Medicated,  Endo/Other    Renal/GU      Musculoskeletal  (+) Arthritis ,   Abdominal   Peds  Hematology   Anesthesia Other Findings Afib GERD Hx of Asthma Cardiac cath 03/09/16 (care everywhere):  - Normal LV function - LMCA: 0%. - LAD: 0%. - LCx: 0%. - RCA: Lesion on Mid RCA: Mid subsection. 95% stenosis 12 mm length reduced to 0% with PCI/DES to RCA.   Reproductive/Obstetrics                           Anesthesia Physical Anesthesia Plan  ASA: III  Anesthesia Plan: General   Post-op Pain Management:    Induction: Intravenous  PONV Risk Score and Plan:   Airway Management Planned: Oral ETT  Additional Equipment: Arterial line, CVP and Ultrasound Guidance Line Placement  Intra-op Plan:   Post-operative Plan: Extubation in OR and Possible Post-op intubation/ventilation  Informed Consent: I have reviewed the patients History and Physical, chart, labs and discussed the procedure including the risks, benefits and alternatives for the proposed anesthesia with the patient or authorized representative who has indicated his/her understanding and acceptance.   Dental Advisory Given  Plan Discussed  with: CRNA and Surgeon  Anesthesia Plan Comments: (  )      Anesthesia Quick Evaluation

## 2016-09-04 ENCOUNTER — Inpatient Hospital Stay (HOSPITAL_COMMUNITY): Payer: Medicare Other

## 2016-09-04 ENCOUNTER — Inpatient Hospital Stay (HOSPITAL_COMMUNITY): Payer: Medicare Other | Admitting: Certified Registered Nurse Anesthetist

## 2016-09-04 ENCOUNTER — Encounter (HOSPITAL_COMMUNITY): Payer: Self-pay

## 2016-09-04 ENCOUNTER — Inpatient Hospital Stay (HOSPITAL_COMMUNITY): Payer: Medicare Other | Admitting: Emergency Medicine

## 2016-09-04 ENCOUNTER — Inpatient Hospital Stay (HOSPITAL_COMMUNITY)
Admission: RE | Disposition: A | Discharge status: Rehabilitation Facility | Payer: Self-pay | Source: Ambulatory Visit | Attending: Neurosurgery

## 2016-09-04 ENCOUNTER — Inpatient Hospital Stay (HOSPITAL_COMMUNITY)
Admission: RE | Admit: 2016-09-04 | Discharge: 2016-09-09 | DRG: 026 | Disposition: A | Discharge status: Rehabilitation Facility | Payer: Medicare Other | Source: Ambulatory Visit | Attending: Neurosurgery | Admitting: Neurosurgery

## 2016-09-04 DIAGNOSIS — Z7982 Long term (current) use of aspirin: Secondary | ICD-10-CM

## 2016-09-04 DIAGNOSIS — D496 Neoplasm of unspecified behavior of brain: Secondary | ICD-10-CM | POA: Diagnosis present

## 2016-09-04 DIAGNOSIS — Z87891 Personal history of nicotine dependence: Secondary | ICD-10-CM | POA: Diagnosis not present

## 2016-09-04 DIAGNOSIS — E876 Hypokalemia: Secondary | ICD-10-CM

## 2016-09-04 DIAGNOSIS — D72828 Other elevated white blood cell count: Secondary | ICD-10-CM | POA: Diagnosis not present

## 2016-09-04 DIAGNOSIS — Z7902 Long term (current) use of antithrombotics/antiplatelets: Secondary | ICD-10-CM | POA: Diagnosis not present

## 2016-09-04 DIAGNOSIS — Z79899 Other long term (current) drug therapy: Secondary | ICD-10-CM

## 2016-09-04 DIAGNOSIS — I4891 Unspecified atrial fibrillation: Secondary | ICD-10-CM | POA: Diagnosis present

## 2016-09-04 DIAGNOSIS — H933X2 Disorders of left acoustic nerve: Secondary | ICD-10-CM | POA: Diagnosis present

## 2016-09-04 DIAGNOSIS — I252 Old myocardial infarction: Secondary | ICD-10-CM | POA: Diagnosis not present

## 2016-09-04 DIAGNOSIS — R739 Hyperglycemia, unspecified: Secondary | ICD-10-CM | POA: Diagnosis not present

## 2016-09-04 DIAGNOSIS — G939 Disorder of brain, unspecified: Secondary | ICD-10-CM | POA: Diagnosis not present

## 2016-09-04 DIAGNOSIS — M5136 Other intervertebral disc degeneration, lumbar region: Secondary | ICD-10-CM | POA: Diagnosis present

## 2016-09-04 DIAGNOSIS — E46 Unspecified protein-calorie malnutrition: Secondary | ICD-10-CM | POA: Diagnosis not present

## 2016-09-04 DIAGNOSIS — Z452 Encounter for adjustment and management of vascular access device: Secondary | ICD-10-CM

## 2016-09-04 DIAGNOSIS — J45909 Unspecified asthma, uncomplicated: Secondary | ICD-10-CM | POA: Diagnosis present

## 2016-09-04 DIAGNOSIS — I2510 Atherosclerotic heart disease of native coronary artery without angina pectoris: Secondary | ICD-10-CM | POA: Diagnosis present

## 2016-09-04 DIAGNOSIS — I951 Orthostatic hypotension: Secondary | ICD-10-CM | POA: Diagnosis not present

## 2016-09-04 DIAGNOSIS — I480 Paroxysmal atrial fibrillation: Secondary | ICD-10-CM

## 2016-09-04 DIAGNOSIS — G8918 Other acute postprocedural pain: Secondary | ICD-10-CM

## 2016-09-04 DIAGNOSIS — C8589 Other specified types of non-Hodgkin lymphoma, extranodal and solid organ sites: Secondary | ICD-10-CM | POA: Diagnosis not present

## 2016-09-04 DIAGNOSIS — D62 Acute posthemorrhagic anemia: Secondary | ICD-10-CM | POA: Diagnosis not present

## 2016-09-04 DIAGNOSIS — K219 Gastro-esophageal reflux disease without esophagitis: Secondary | ICD-10-CM | POA: Diagnosis present

## 2016-09-04 DIAGNOSIS — R29810 Facial weakness: Secondary | ICD-10-CM | POA: Diagnosis present

## 2016-09-04 DIAGNOSIS — T380X5A Adverse effect of glucocorticoids and synthetic analogues, initial encounter: Secondary | ICD-10-CM | POA: Diagnosis not present

## 2016-09-04 DIAGNOSIS — R1312 Dysphagia, oropharyngeal phase: Secondary | ICD-10-CM | POA: Diagnosis not present

## 2016-09-04 DIAGNOSIS — C8339 Diffuse large B-cell lymphoma, extranodal and solid organ sites: Secondary | ICD-10-CM | POA: Diagnosis not present

## 2016-09-04 DIAGNOSIS — R471 Dysarthria and anarthria: Secondary | ICD-10-CM | POA: Diagnosis present

## 2016-09-04 DIAGNOSIS — G9389 Other specified disorders of brain: Secondary | ICD-10-CM | POA: Diagnosis present

## 2016-09-04 DIAGNOSIS — R197 Diarrhea, unspecified: Secondary | ICD-10-CM

## 2016-09-04 DIAGNOSIS — R1310 Dysphagia, unspecified: Secondary | ICD-10-CM | POA: Diagnosis not present

## 2016-09-04 DIAGNOSIS — I491 Atrial premature depolarization: Secondary | ICD-10-CM | POA: Diagnosis not present

## 2016-09-04 DIAGNOSIS — R270 Ataxia, unspecified: Secondary | ICD-10-CM | POA: Diagnosis not present

## 2016-09-04 DIAGNOSIS — D72829 Elevated white blood cell count, unspecified: Secondary | ICD-10-CM | POA: Diagnosis not present

## 2016-09-04 DIAGNOSIS — I493 Ventricular premature depolarization: Secondary | ICD-10-CM | POA: Diagnosis not present

## 2016-09-04 DIAGNOSIS — G508 Other disorders of trigeminal nerve: Secondary | ICD-10-CM | POA: Diagnosis not present

## 2016-09-04 DIAGNOSIS — H532 Diplopia: Secondary | ICD-10-CM | POA: Diagnosis present

## 2016-09-04 DIAGNOSIS — Z955 Presence of coronary angioplasty implant and graft: Secondary | ICD-10-CM | POA: Diagnosis not present

## 2016-09-04 DIAGNOSIS — I10 Essential (primary) hypertension: Secondary | ICD-10-CM | POA: Diagnosis not present

## 2016-09-04 DIAGNOSIS — H9192 Unspecified hearing loss, left ear: Secondary | ICD-10-CM | POA: Diagnosis present

## 2016-09-04 DIAGNOSIS — R0989 Other specified symptoms and signs involving the circulatory and respiratory systems: Secondary | ICD-10-CM | POA: Diagnosis not present

## 2016-09-04 DIAGNOSIS — H547 Unspecified visual loss: Secondary | ICD-10-CM | POA: Diagnosis not present

## 2016-09-04 HISTORY — PX: APPLICATION OF CRANIAL NAVIGATION: SHX6578

## 2016-09-04 HISTORY — DX: Ventricular premature depolarization: I49.3

## 2016-09-04 HISTORY — PX: BRAIN SURGERY: SHX531

## 2016-09-04 HISTORY — DX: Other specified disorders of brain: G93.89

## 2016-09-04 HISTORY — DX: Neoplasm of unspecified behavior of brain: D49.6

## 2016-09-04 HISTORY — PX: RETROSIGMOID CRANIECTOMY FOR TUMOR RESECTION: SHX6072

## 2016-09-04 LAB — POCT I-STAT 7, (LYTES, BLD GAS, ICA,H+H)
ACID-BASE EXCESS: 3 mmol/L — AB (ref 0.0–2.0)
ACID-BASE EXCESS: 1 mmol/L (ref 0.0–2.0)
BICARBONATE: 28.2 mmol/L — AB (ref 20.0–28.0)
BICARBONATE: 27.1 mmol/L (ref 20.0–28.0)
CALCIUM ION: 1.07 mmol/L — AB (ref 1.15–1.40)
CALCIUM ION: 1.11 mmol/L — AB (ref 1.15–1.40)
HEMATOCRIT: 30 % — AB (ref 36.0–46.0)
HEMATOCRIT: 31 % — AB (ref 36.0–46.0)
HEMOGLOBIN: 10.5 g/dL — AB (ref 12.0–15.0)
Hemoglobin: 10.2 g/dL — ABNORMAL LOW (ref 12.0–15.0)
O2 SAT: 100 %
O2 Saturation: 100 %
PH ART: 7.365 (ref 7.350–7.450)
Patient temperature: 35
Potassium: 2.7 mmol/L — CL (ref 3.5–5.1)
Potassium: 3.5 mmol/L (ref 3.5–5.1)
SODIUM: 136 mmol/L (ref 135–145)
SODIUM: 143 mmol/L (ref 135–145)
TCO2: 30 mmol/L (ref 0–100)
TCO2: 29 mmol/L (ref 0–100)
pCO2 arterial: 42.3 mmHg (ref 32.0–48.0)
pCO2 arterial: 46.9 mmHg (ref 32.0–48.0)
pH, Arterial: 7.422 (ref 7.350–7.450)
pO2, Arterial: 335 mmHg — ABNORMAL HIGH (ref 83.0–108.0)
pO2, Arterial: 201 mmHg — ABNORMAL HIGH (ref 83.0–108.0)

## 2016-09-04 LAB — PROTIME-INR
INR: 1.03
Prothrombin Time: 13.5 seconds (ref 11.4–15.2)

## 2016-09-04 LAB — MRSA PCR SCREENING: MRSA BY PCR: NEGATIVE

## 2016-09-04 LAB — PREPARE RBC (CROSSMATCH)

## 2016-09-04 LAB — POCT I-STAT 4, (NA,K, GLUC, HGB,HCT)
Glucose, Bld: 150 mg/dL — ABNORMAL HIGH (ref 65–99)
HCT: 31 % — ABNORMAL LOW (ref 36.0–46.0)
Hemoglobin: 10.5 g/dL — ABNORMAL LOW (ref 12.0–15.0)
Potassium: 3.4 mmol/L — ABNORMAL LOW (ref 3.5–5.1)
Sodium: 142 mmol/L (ref 135–145)

## 2016-09-04 SURGERY — RETROSIGMOID CRANIECTOMY FOR TUMOR RESECTION
Anesthesia: General | Site: Head

## 2016-09-04 MED ORDER — PHENYLEPHRINE 40 MCG/ML (10ML) SYRINGE FOR IV PUSH (FOR BLOOD PRESSURE SUPPORT)
PREFILLED_SYRINGE | INTRAVENOUS | Status: AC
  Filled 2016-09-04: qty 10

## 2016-09-04 MED ORDER — THROMBIN 5000 UNITS EX SOLR
OROMUCOSAL | Status: DC | PRN
  Administered 2016-09-04: 10:00:00 via TOPICAL

## 2016-09-04 MED ORDER — LABETALOL HCL 5 MG/ML IV SOLN
INTRAVENOUS | Status: AC
  Filled 2016-09-04: qty 4

## 2016-09-04 MED ORDER — LABETALOL HCL 5 MG/ML IV SOLN
10.0000 mg | INTRAVENOUS | Status: DC | PRN
  Administered 2016-09-04: 10 mg via INTRAVENOUS
  Filled 2016-09-04 (×2): qty 8

## 2016-09-04 MED ORDER — DEXAMETHASONE SODIUM PHOSPHATE 4 MG/ML IJ SOLN
4.0000 mg | Freq: Four times a day (QID) | INTRAMUSCULAR | Status: AC
  Administered 2016-09-05 – 2016-09-06 (×4): 4 mg via INTRAVENOUS
  Filled 2016-09-04 (×4): qty 1

## 2016-09-04 MED ORDER — CEFAZOLIN SODIUM 1 G IJ SOLR
INTRAMUSCULAR | Status: AC
  Filled 2016-09-04: qty 20

## 2016-09-04 MED ORDER — SODIUM CHLORIDE 0.9 % IV SOLN
0.0500 ug/kg/min | INTRAVENOUS | Status: AC
  Administered 2016-09-04: 0.1 ug/kg/min via INTRAVENOUS
  Filled 2016-09-04: qty 5000

## 2016-09-04 MED ORDER — DEXAMETHASONE SODIUM PHOSPHATE 10 MG/ML IJ SOLN
INTRAMUSCULAR | Status: DC | PRN
  Administered 2016-09-04: 10 mg via INTRAVENOUS

## 2016-09-04 MED ORDER — BUPIVACAINE HCL (PF) 0.5 % IJ SOLN
INTRAMUSCULAR | Status: AC
  Filled 2016-09-04: qty 30

## 2016-09-04 MED ORDER — ONDANSETRON HCL 4 MG/2ML IJ SOLN
INTRAMUSCULAR | Status: DC | PRN
  Administered 2016-09-04: 4 mg via INTRAVENOUS

## 2016-09-04 MED ORDER — SUCCINYLCHOLINE CHLORIDE 200 MG/10ML IV SOSY
PREFILLED_SYRINGE | INTRAVENOUS | Status: AC
  Filled 2016-09-04: qty 10

## 2016-09-04 MED ORDER — FENTANYL CITRATE (PF) 250 MCG/5ML IJ SOLN
INTRAMUSCULAR | Status: AC
  Filled 2016-09-04: qty 5

## 2016-09-04 MED ORDER — ONDANSETRON HCL 4 MG/2ML IJ SOLN
4.0000 mg | INTRAMUSCULAR | Status: DC | PRN
  Administered 2016-09-04 – 2016-09-07 (×4): 4 mg via INTRAVENOUS
  Filled 2016-09-04 (×4): qty 2

## 2016-09-04 MED ORDER — PHENYLEPHRINE HCL 10 MG/ML IJ SOLN: INTRAMUSCULAR | Status: DC | PRN

## 2016-09-04 MED ORDER — ARTIFICIAL TEARS OPHTHALMIC OINT
TOPICAL_OINTMENT | OPHTHALMIC | Status: DC | PRN
  Administered 2016-09-04: 1 via OPHTHALMIC

## 2016-09-04 MED ORDER — EPHEDRINE 5 MG/ML INJ
INTRAVENOUS | Status: AC
  Filled 2016-09-04: qty 10

## 2016-09-04 MED ORDER — HEMOSTATIC AGENTS (NO CHARGE) OPTIME
TOPICAL | Status: DC | PRN
  Administered 2016-09-04 (×2): 1 via TOPICAL

## 2016-09-04 MED ORDER — SUCCINYLCHOLINE CHLORIDE 200 MG/10ML IV SOSY
PREFILLED_SYRINGE | INTRAVENOUS | Status: DC | PRN
  Administered 2016-09-04: 100 mg via INTRAVENOUS

## 2016-09-04 MED ORDER — BUPIVACAINE HCL (PF) 0.5 % IJ SOLN
INTRAMUSCULAR | Status: DC | PRN
  Administered 2016-09-04: 4.5 mL

## 2016-09-04 MED ORDER — PANTOPRAZOLE SODIUM 40 MG PO TBEC
80.0000 mg | DELAYED_RELEASE_TABLET | Freq: Every day | ORAL | Status: DC
  Administered 2016-09-05 – 2016-09-09 (×5): 80 mg via ORAL
  Filled 2016-09-04 (×5): qty 2

## 2016-09-04 MED ORDER — METOPROLOL TARTRATE 25 MG PO TABS
25.0000 mg | ORAL_TABLET | Freq: Two times a day (BID) | ORAL | Status: DC
  Administered 2016-09-04 – 2016-09-09 (×10): 25 mg via ORAL
  Filled 2016-09-04 (×10): qty 1

## 2016-09-04 MED ORDER — BACITRACIN ZINC 500 UNIT/GM EX OINT
TOPICAL_OINTMENT | CUTANEOUS | Status: DC | PRN
  Administered 2016-09-04: 1 via TOPICAL

## 2016-09-04 MED ORDER — TICAGRELOR 90 MG PO TABS
90.0000 mg | ORAL_TABLET | Freq: Two times a day (BID) | ORAL | Status: DC
  Filled 2016-09-04 (×2): qty 1

## 2016-09-04 MED ORDER — ATORVASTATIN CALCIUM 80 MG PO TABS
80.0000 mg | ORAL_TABLET | Freq: Every day | ORAL | Status: DC
  Administered 2016-09-05 – 2016-09-09 (×5): 80 mg via ORAL
  Filled 2016-09-04 (×5): qty 1

## 2016-09-04 MED ORDER — FENTANYL CITRATE (PF) 100 MCG/2ML IJ SOLN
INTRAMUSCULAR | Status: AC
  Administered 2016-09-04: 50 ug via INTRAVENOUS
  Filled 2016-09-04: qty 2

## 2016-09-04 MED ORDER — PANTOPRAZOLE SODIUM 40 MG IV SOLR: 40.0000 mg | Freq: Every day | INTRAVENOUS | Status: DC

## 2016-09-04 MED ORDER — THROMBIN 20000 UNITS EX SOLR
CUTANEOUS | Status: AC
  Filled 2016-09-04: qty 40000

## 2016-09-04 MED ORDER — NITROGLYCERIN 0.4 MG SL SUBL: 0.4000 mg | SUBLINGUAL_TABLET | SUBLINGUAL | Status: DC | PRN

## 2016-09-04 MED ORDER — ROCURONIUM BROMIDE 50 MG/5ML IV SOLN
INTRAVENOUS | Status: AC
  Filled 2016-09-04: qty 1

## 2016-09-04 MED ORDER — DOCUSATE SODIUM 100 MG PO CAPS
100.0000 mg | ORAL_CAPSULE | Freq: Two times a day (BID) | ORAL | Status: DC
  Administered 2016-09-06 – 2016-09-09 (×6): 100 mg via ORAL
  Filled 2016-09-04 (×7): qty 1

## 2016-09-04 MED ORDER — POTASSIUM CHLORIDE 10 MEQ/50ML IV SOLN
10.0000 meq | INTRAVENOUS | Status: DC
  Administered 2016-09-04 (×3): 10 meq via INTRAVENOUS
  Filled 2016-09-04 (×7): qty 50

## 2016-09-04 MED ORDER — SENNA 8.6 MG PO TABS
1.0000 | ORAL_TABLET | Freq: Two times a day (BID) | ORAL | Status: DC
  Administered 2016-09-05 – 2016-09-06 (×4): 8.6 mg via ORAL
  Filled 2016-09-04 (×4): qty 1

## 2016-09-04 MED ORDER — EPHEDRINE SULFATE-NACL 50-0.9 MG/10ML-% IV SOSY
PREFILLED_SYRINGE | INTRAVENOUS | Status: DC | PRN
  Administered 2016-09-04 (×2): 5 mg via INTRAVENOUS

## 2016-09-04 MED ORDER — LIDOCAINE HCL (CARDIAC) 20 MG/ML IV SOLN
INTRAVENOUS | Status: AC
  Filled 2016-09-04: qty 5

## 2016-09-04 MED ORDER — DEXAMETHASONE SODIUM PHOSPHATE 4 MG/ML IJ SOLN
4.0000 mg | Freq: Three times a day (TID) | INTRAMUSCULAR | Status: DC
  Administered 2016-09-06 – 2016-09-09 (×10): 4 mg via INTRAVENOUS
  Filled 2016-09-04 (×10): qty 1

## 2016-09-04 MED ORDER — FENTANYL CITRATE (PF) 100 MCG/2ML IJ SOLN
25.0000 ug | INTRAMUSCULAR | Status: DC | PRN
  Administered 2016-09-04 (×2): 50 ug via INTRAVENOUS

## 2016-09-04 MED ORDER — THROMBIN 20000 UNITS EX SOLR
CUTANEOUS | Status: AC
  Filled 2016-09-04: qty 20000

## 2016-09-04 MED ORDER — THROMBIN 5000 UNITS EX SOLR
CUTANEOUS | Status: AC
  Filled 2016-09-04: qty 5000

## 2016-09-04 MED ORDER — LABETALOL HCL 5 MG/ML IV SOLN
INTRAVENOUS | Status: DC | PRN
  Administered 2016-09-04: 5 mg via INTRAVENOUS
  Administered 2016-09-04 (×2): 2.5 mg via INTRAVENOUS

## 2016-09-04 MED ORDER — PROPOFOL 10 MG/ML IV BOLUS
INTRAVENOUS | Status: DC | PRN
  Administered 2016-09-04: 10 mg via INTRAVENOUS
  Administered 2016-09-04: 30 mg via INTRAVENOUS
  Administered 2016-09-04: 50 mg via INTRAVENOUS
  Administered 2016-09-04: 20 mg via INTRAVENOUS

## 2016-09-04 MED ORDER — ONDANSETRON HCL 4 MG/2ML IJ SOLN
INTRAMUSCULAR | Status: AC
  Filled 2016-09-04: qty 2

## 2016-09-04 MED ORDER — MECLIZINE HCL 25 MG PO TABS
25.0000 mg | ORAL_TABLET | Freq: Three times a day (TID) | ORAL | Status: DC | PRN
  Filled 2016-09-04: qty 1

## 2016-09-04 MED ORDER — PHENYLEPHRINE 40 MCG/ML (10ML) SYRINGE FOR IV PUSH (FOR BLOOD PRESSURE SUPPORT)
PREFILLED_SYRINGE | INTRAVENOUS | Status: DC | PRN
  Administered 2016-09-04: 80 ug via INTRAVENOUS
  Administered 2016-09-04: 120 ug via INTRAVENOUS
  Administered 2016-09-04: 80 ug via INTRAVENOUS

## 2016-09-04 MED ORDER — PROMETHAZINE HCL 25 MG/ML IJ SOLN
12.5000 mg | Freq: Once | INTRAMUSCULAR | Status: AC
  Administered 2016-09-04: 12.5 mg via INTRAVENOUS
  Filled 2016-09-04: qty 1

## 2016-09-04 MED ORDER — FENTANYL CITRATE (PF) 100 MCG/2ML IJ SOLN
INTRAMUSCULAR | Status: DC | PRN
  Administered 2016-09-04 (×4): 25 ug via INTRAVENOUS

## 2016-09-04 MED ORDER — 0.9 % SODIUM CHLORIDE (POUR BTL) OPTIME
TOPICAL | Status: DC | PRN
  Administered 2016-09-04 (×3): 1000 mL

## 2016-09-04 MED ORDER — SODIUM CHLORIDE 0.9 % IV SOLN
INTRAVENOUS | Status: DC | PRN
  Administered 2016-09-04: 07:00:00 via INTRAVENOUS

## 2016-09-04 MED ORDER — SODIUM CHLORIDE 0.9 % IV SOLN
0.0125 ug/kg/min | INTRAVENOUS | Status: DC
  Filled 2016-09-04: qty 2000

## 2016-09-04 MED ORDER — ARTIFICIAL TEARS OPHTHALMIC OINT
TOPICAL_OINTMENT | OPHTHALMIC | Status: AC
  Filled 2016-09-04: qty 3.5

## 2016-09-04 MED ORDER — PROMETHAZINE HCL 25 MG PO TABS
12.5000 mg | ORAL_TABLET | ORAL | Status: DC | PRN
Start: 2016-09-04 — End: 2016-09-09
  Administered 2016-09-05: 25 mg via ORAL
  Filled 2016-09-04: qty 1

## 2016-09-04 MED ORDER — THROMBIN 20000 UNITS EX SOLR
CUTANEOUS | Status: DC | PRN
  Administered 2016-09-04 (×3): via TOPICAL

## 2016-09-04 MED ORDER — CHLORHEXIDINE GLUCONATE CLOTH 2 % EX PADS: 6.0000 | MEDICATED_PAD | Freq: Once | CUTANEOUS | Status: DC

## 2016-09-04 MED ORDER — LIDOCAINE-EPINEPHRINE 1 %-1:100000 IJ SOLN
INTRAMUSCULAR | Status: DC | PRN
  Administered 2016-09-04: 4.5 mL

## 2016-09-04 MED ORDER — PROPOFOL 10 MG/ML IV BOLUS
INTRAVENOUS | Status: AC
  Filled 2016-09-04: qty 20

## 2016-09-04 MED ORDER — BISACODYL 10 MG RE SUPP: 10.0000 mg | Freq: Every day | RECTAL | Status: DC | PRN

## 2016-09-04 MED ORDER — DEXAMETHASONE SODIUM PHOSPHATE 10 MG/ML IJ SOLN
6.0000 mg | Freq: Four times a day (QID) | INTRAMUSCULAR | Status: AC
  Administered 2016-09-04 – 2016-09-05 (×4): 6 mg via INTRAVENOUS
  Filled 2016-09-04 (×4): qty 1

## 2016-09-04 MED ORDER — MANNITOL 25 % IV SOLN
INTRAVENOUS | Status: DC | PRN
  Administered 2016-09-04: 25 g via INTRAVENOUS

## 2016-09-04 MED ORDER — LIDOCAINE-EPINEPHRINE 1 %-1:100000 IJ SOLN
INTRAMUSCULAR | Status: AC
  Filled 2016-09-04: qty 1

## 2016-09-04 MED ORDER — ESMOLOL HCL 100 MG/10ML IV SOLN
INTRAVENOUS | Status: AC
  Filled 2016-09-04: qty 10

## 2016-09-04 MED ORDER — SODIUM CHLORIDE 0.9 % IV SOLN
INTRAVENOUS | Status: DC | PRN
  Administered 2016-09-04 (×2): via INTRAVENOUS

## 2016-09-04 MED ORDER — GLYCOPYRROLATE 0.2 MG/ML IV SOSY
PREFILLED_SYRINGE | INTRAVENOUS | Status: DC | PRN
  Administered 2016-09-04: .2 mg via INTRAVENOUS

## 2016-09-04 MED ORDER — CEFAZOLIN SODIUM-DEXTROSE 1-4 GM/50ML-% IV SOLN
1.0000 g | Freq: Three times a day (TID) | INTRAVENOUS | Status: AC
  Administered 2016-09-04 (×2): 1 g via INTRAVENOUS
  Filled 2016-09-04 (×2): qty 50

## 2016-09-04 MED ORDER — SODIUM CHLORIDE 0.9 % IV SOLN: Freq: Once | INTRAVENOUS | Status: DC

## 2016-09-04 MED ORDER — HYDROCODONE-ACETAMINOPHEN 5-325 MG PO TABS: 1.0000 | ORAL_TABLET | ORAL | Status: DC | PRN

## 2016-09-04 MED ORDER — SODIUM CHLORIDE 0.9 % IV SOLN
INTRAVENOUS | Status: DC
  Administered 2016-09-04 – 2016-09-08 (×6): via INTRAVENOUS

## 2016-09-04 MED ORDER — CEFAZOLIN SODIUM-DEXTROSE 2-4 GM/100ML-% IV SOLN
2.0000 g | INTRAVENOUS | Status: AC
  Administered 2016-09-04: 2 g via INTRAVENOUS
  Filled 2016-09-04: qty 100

## 2016-09-04 MED ORDER — CEFAZOLIN SODIUM 1 G IJ SOLR
INTRAMUSCULAR | Status: DC | PRN
  Administered 2016-09-04: 2 g via INTRAMUSCULAR

## 2016-09-04 MED ORDER — ONDANSETRON HCL 4 MG PO TABS: 4.0000 mg | ORAL_TABLET | ORAL | Status: DC | PRN

## 2016-09-04 MED ORDER — PHENYLEPHRINE HCL 10 MG/ML IJ SOLN
INTRAMUSCULAR | Status: DC | PRN
  Administered 2016-09-04: 25 ug/min via INTRAVENOUS
  Administered 2016-09-04: 12:00:00 via INTRAVENOUS

## 2016-09-04 MED ORDER — VITAMIN D 1000 UNITS PO TABS
2000.0000 [IU] | ORAL_TABLET | Freq: Every day | ORAL | Status: DC
  Administered 2016-09-05 – 2016-09-09 (×5): 2000 [IU] via ORAL
  Filled 2016-09-04 (×6): qty 2

## 2016-09-04 MED ORDER — VITAMIN B-12 1000 MCG PO TABS
5000.0000 ug | ORAL_TABLET | Freq: Every day | ORAL | Status: DC
  Administered 2016-09-05 – 2016-09-09 (×5): 5000 ug via ORAL
  Filled 2016-09-04 (×2): qty 5
  Filled 2016-09-04: qty 50
  Filled 2016-09-04 (×3): qty 5

## 2016-09-04 MED ORDER — HYDROCHLOROTHIAZIDE 25 MG PO TABS
12.5000 mg | ORAL_TABLET | Freq: Every morning | ORAL | Status: DC
  Administered 2016-09-05 – 2016-09-09 (×5): 12.5 mg via ORAL
  Filled 2016-09-04 (×5): qty 1

## 2016-09-04 SURGICAL SUPPLY — 104 items
BANDAGE GAUZE 4  KLING STR (GAUZE/BANDAGES/DRESSINGS) ×4 IMPLANT
BATTERY IQ STERILE (MISCELLANEOUS) ×4 IMPLANT
BENZOIN TINCTURE PRP APPL 2/3 (GAUZE/BANDAGES/DRESSINGS) IMPLANT
BLADE CLIPPER SURG (BLADE) ×8 IMPLANT
BLADE SAW GIGLI 16 STRL (MISCELLANEOUS) IMPLANT
BLADE SURG 15 STRL LF DISP TIS (BLADE) IMPLANT
BLADE SURG 15 STRL SS (BLADE)
BLADE ULTRA TIP 2M (BLADE) IMPLANT
BNDG GAUZE ELAST 4 BULKY (GAUZE/BANDAGES/DRESSINGS) ×4 IMPLANT
BUR ACORN 6.0 PRECISION (BURR) ×3 IMPLANT
BUR ACORN 6.0MM PRECISION (BURR) ×1
BUR ROUND FLUTED 4 SOFT TCH (BURR) ×3 IMPLANT
BUR ROUND FLUTED 4MM SOFT TCH (BURR) ×1
BUR SPIRAL ROUTER 2.3 (BUR) ×3 IMPLANT
BUR SPIRAL ROUTER 2.3MM (BUR) ×1
CANISTER SUCT 3000ML PPV (MISCELLANEOUS) ×8 IMPLANT
CARTRIDGE OIL MAESTRO DRILL (MISCELLANEOUS) ×2 IMPLANT
CATH VENTRIC 35X38 W/TROCAR LG (CATHETERS) IMPLANT
CONT SPEC 4OZ CLIKSEAL STRL BL (MISCELLANEOUS) ×4 IMPLANT
DERMABOND ADVANCED (GAUZE/BANDAGES/DRESSINGS) ×2
DERMABOND ADVANCED .7 DNX12 (GAUZE/BANDAGES/DRESSINGS) ×2 IMPLANT
DIFFUSER DRILL AIR PNEUMATIC (MISCELLANEOUS) ×4 IMPLANT
DRAIN SUBARACHNOID (WOUND CARE) IMPLANT
DRAPE HALF SHEET 40X57 (DRAPES) ×8 IMPLANT
DRAPE MICROSCOPE LEICA (MISCELLANEOUS) ×4 IMPLANT
DRAPE NEUROLOGICAL W/INCISE (DRAPES) ×4 IMPLANT
DRAPE STERI IOBAN 125X83 (DRAPES) IMPLANT
DRAPE SURG 17X23 STRL (DRAPES) IMPLANT
DRAPE WARM FLUID 44X44 (DRAPE) ×4 IMPLANT
DRSG ADAPTIC 3X8 NADH LF (GAUZE/BANDAGES/DRESSINGS) ×4 IMPLANT
DRSG TELFA 3X8 NADH (GAUZE/BANDAGES/DRESSINGS) IMPLANT
DURAMATRIX ONLAY 3X3 (Plate) ×4 IMPLANT
DURAPREP 6ML APPLICATOR 50/CS (WOUND CARE) ×4 IMPLANT
ELECT REM PT RETURN 9FT ADLT (ELECTROSURGICAL) ×4
ELECTRODE REM PT RTRN 9FT ADLT (ELECTROSURGICAL) ×2 IMPLANT
EVACUATOR 1/8 PVC DRAIN (DRAIN) IMPLANT
EVACUATOR SILICONE 100CC (DRAIN) IMPLANT
FEE INTRAOP MONITOR IMPULS NCS (MISCELLANEOUS) ×2 IMPLANT
FELT TEFLON 6X6 (MISCELLANEOUS) IMPLANT
FORCEPS BIPOLAR SPETZLER 8 1.0 (NEUROSURGERY SUPPLIES) ×4 IMPLANT
GAUZE SPONGE 4X4 12PLY STRL (GAUZE/BANDAGES/DRESSINGS) IMPLANT
GAUZE SPONGE 4X4 16PLY XRAY LF (GAUZE/BANDAGES/DRESSINGS) IMPLANT
GLOVE BIO SURGEON STRL SZ7 (GLOVE) ×8 IMPLANT
GLOVE BIOGEL PI IND STRL 7.0 (GLOVE) ×4 IMPLANT
GLOVE BIOGEL PI IND STRL 7.5 (GLOVE) ×6 IMPLANT
GLOVE BIOGEL PI INDICATOR 7.0 (GLOVE) ×4
GLOVE BIOGEL PI INDICATOR 7.5 (GLOVE) ×6
GLOVE ECLIPSE 7.0 STRL STRAW (GLOVE) ×8 IMPLANT
GLOVE EXAM NITRILE LRG STRL (GLOVE) IMPLANT
GLOVE EXAM NITRILE XL STR (GLOVE) IMPLANT
GLOVE EXAM NITRILE XS STR PU (GLOVE) IMPLANT
GOWN STRL REUS W/ TWL LRG LVL3 (GOWN DISPOSABLE) ×4 IMPLANT
GOWN STRL REUS W/ TWL XL LVL3 (GOWN DISPOSABLE) ×2 IMPLANT
GOWN STRL REUS W/TWL 2XL LVL3 (GOWN DISPOSABLE) ×4 IMPLANT
GOWN STRL REUS W/TWL LRG LVL3 (GOWN DISPOSABLE) ×4
GOWN STRL REUS W/TWL XL LVL3 (GOWN DISPOSABLE) ×2
HEMOSTAT POWDER KIT SURGIFOAM (HEMOSTASIS) ×4 IMPLANT
HEMOSTAT SURGICEL 2X14 (HEMOSTASIS) ×4 IMPLANT
INTRAOP MONITOR FEE IMPULS NCS (MISCELLANEOUS) ×2
INTRAOP MONITOR FEE IMPULSE (MISCELLANEOUS) ×2
IV NS 1000ML (IV SOLUTION) ×2
IV NS 1000ML BAXH (IV SOLUTION) ×2 IMPLANT
KIT BASIN OR (CUSTOM PROCEDURE TRAY) ×4 IMPLANT
KIT DRAIN CSF ACCUDRAIN (MISCELLANEOUS) IMPLANT
KIT ROOM TURNOVER OR (KITS) ×4 IMPLANT
KNIFE ARACHNOID DISP AM-24-S (MISCELLANEOUS) ×4 IMPLANT
MARKER SPHERE PSV REFLC 13MM (MARKER) ×8 IMPLANT
NEEDLE HYPO 25X1 1.5 SAFETY (NEEDLE) ×4 IMPLANT
NS IRRIG 1000ML POUR BTL (IV SOLUTION) ×12 IMPLANT
OIL CARTRIDGE MAESTRO DRILL (MISCELLANEOUS) ×4
PACK CRANIOTOMY (CUSTOM PROCEDURE TRAY) ×4 IMPLANT
PATTIES SURGICAL .25X.25 (GAUZE/BANDAGES/DRESSINGS) ×4 IMPLANT
PATTIES SURGICAL .5 X.5 (GAUZE/BANDAGES/DRESSINGS) IMPLANT
PATTIES SURGICAL .5 X3 (DISPOSABLE) IMPLANT
PATTIES SURGICAL 1/4 X 3 (GAUZE/BANDAGES/DRESSINGS) IMPLANT
PATTIES SURGICAL 1X1 (DISPOSABLE) IMPLANT
PIN MAYFIELD SKULL DISP (PIN) ×4 IMPLANT
PLATE 1.5/0.2 85X50M HEX PNL (Plate) ×4 IMPLANT
RUBBERBAND STERILE (MISCELLANEOUS) IMPLANT
SCREW SELF DRILL HT 1.5/4MM (Screw) ×28 IMPLANT
SEALANT ADHERUS EXTEND TIP (MISCELLANEOUS) ×4 IMPLANT
SET TUBING W/EXT DISP (INSTRUMENTS) ×4 IMPLANT
SPECIMEN JAR SMALL (MISCELLANEOUS) IMPLANT
SPONGE NEURO XRAY DETECT 1X3 (DISPOSABLE) IMPLANT
SPONGE SURGIFOAM ABS GEL 100 (HEMOSTASIS) ×12 IMPLANT
STAPLER VISISTAT 35W (STAPLE) ×4 IMPLANT
STOCKINETTE 6  STRL (DRAPES) ×2
STOCKINETTE 6 STRL (DRAPES) ×2 IMPLANT
SUT ETHILON 3 0 FSL (SUTURE) IMPLANT
SUT ETHILON 3 0 PS 1 (SUTURE) IMPLANT
SUT NURALON 4 0 TR CR/8 (SUTURE) ×8 IMPLANT
SUT SILK 0 TIES 10X30 (SUTURE) IMPLANT
SUT VIC AB 0 CT1 18XCR BRD8 (SUTURE) ×4 IMPLANT
SUT VIC AB 0 CT1 8-18 (SUTURE) ×4
SUT VIC AB 3-0 SH 8-18 (SUTURE) ×8 IMPLANT
SUT VICRYL 3-0 RB1 18 ABS (SUTURE) ×12 IMPLANT
TIP STRAIGHT 25KHZ (INSTRUMENTS) IMPLANT
TOWEL GREEN STERILE (TOWEL DISPOSABLE) ×4 IMPLANT
TOWEL GREEN STERILE FF (TOWEL DISPOSABLE) ×8 IMPLANT
TRAY FOLEY W/METER SILVER 16FR (SET/KITS/TRAYS/PACK) ×4 IMPLANT
TUBE CONNECTING 12'X1/4 (SUCTIONS) ×1
TUBE CONNECTING 12X1/4 (SUCTIONS) ×3 IMPLANT
UNDERPAD 30X30 (UNDERPADS AND DIAPERS) ×4 IMPLANT
WATER STERILE IRR 1000ML POUR (IV SOLUTION) ×4 IMPLANT

## 2016-09-04 NOTE — Transfer of Care (Signed)
Immediate Anesthesia Transfer of Care Note  Patient: Heavenleigh Petruzzi Happel  Procedure(s) Performed: Procedure(s) with comments: RETROSIGMOID CRANIECTOMY FOR TUMOR RESECTION WITH BRAINLAB NAVIGATION (Left) - CRANIOTOMY TUMOR EXCISION APPLICATION OF CRANIAL NAVIGATION (N/A)  Patient Location: PACU  Anesthesia Type:General  Level of Consciousness: awake, alert  and oriented  Airway & Oxygen Therapy: Patient Spontanous Breathing and Patient connected to nasal cannula oxygen  Post-op Assessment: Report given to RN, Post -op Vital signs reviewed and stable, Patient moving all extremities X 4 and Patient able to stick tongue midline  Post vital signs: Reviewed and stable  Last Vitals:  Vitals:   09/04/16 1234 09/04/16 1235  BP: (!) 141/72   Pulse: 89 85  Resp: 18 15  Temp:      Last Pain:  Vitals:   09/04/16 0616  TempSrc: Oral  PainSc:       Patients Stated Pain Goal: 5 (84/12/82 0813)  Complications: No apparent anesthesia complications

## 2016-09-04 NOTE — Anesthesia Procedure Notes (Signed)
Arterial Line Insertion Start/End7/07/2016 7:15 AM Performed by: Laretta Alstrom  Patient location: Pre-op. Preanesthetic checklist: patient identified, IV checked, site marked, risks and benefits discussed, surgical consent, monitors and equipment checked, pre-op evaluation and anesthesia consent Lidocaine 1% used for infiltration and patient sedated Right, radial was placed Catheter size: 20 G Hand hygiene performed  and maximum sterile barriers used   Attempts: 3 Procedure performed without using ultrasound guided technique. Ultrasound Notes:anatomy identified and no ultrasound evidence of intravascular and/or intraneural injection Following insertion, dressing applied and Biopatch. Post procedure assessment: normal  Patient tolerated the procedure well with no immediate complications.

## 2016-09-04 NOTE — Anesthesia Procedure Notes (Addendum)
Central Venous Catheter Insertion Performed by: Lyndle Herrlich, anesthesiologist Start/End7/07/2016 7:01 AM, 09/04/2016 7:15 AM Patient location: Pre-op. Preanesthetic checklist: patient identified, IV checked, site marked, risks and benefits discussed, surgical consent, monitors and equipment checked, pre-op evaluation, timeout performed and anesthesia consent Lidocaine 1% used for infiltration and patient sedated Hand hygiene performed  and maximum sterile barriers used  Catheter size: 8 Fr Total catheter length 16. Central line was placed.Double lumen Procedure performed using ultrasound guided technique. Ultrasound Notes:image(s) printed for medical record Attempts: 1 Following insertion, dressing applied, line sutured and Biopatch. Post procedure assessment: blood return through all ports  Patient tolerated the procedure well with no immediate complications.

## 2016-09-04 NOTE — H&P (Signed)
CC:  Facial droop, deafness  HPI: Kimberly Vaughn is a 71 y.o. female I'm seeing for rapid progression of left-sided hearing loss and facial droop. She initially presented back in Nov with fairly rapid progression of hearing loss. She then developed rapid progression of facial droop in Jan. Initial MRI demonstrated enhancement of the this tibial cochlear nerve complex as well as the facial nerve primarily within the temporal bone. Repeat MRI a few months later in May, demonstrated increased enhancement of the nerve, as well as a very small nodular enhancement at the root entry zone in the brainstem. Unfortunately, she had had cardiac stent placement back in January, and was on aspirin and Brilinta. At that point, she presented to the outpatient neurosurgery clinic. Given the recent cardiac incident and her antiplatelet agents, we elected to proceed with close radiologic observation. About 6 weeks later, I repeated the brain MRI which demonstrated marked enlargement of the enhancing nodule at the brainstem, now measuring more than 2 cm. With that new finding, certainly the concern was for some kind of malignancy. At this point, she had also started to complain of left-sided facial numbness including all 3 distributions of the trigeminal nerve. We therefore elected to proceed with surgery with a goal of diagnosis, and conceivably some reduction of mass effect.  PMH: Past Medical History:  Diagnosis Date  . A-fib ([place])   . Anginal pain ([place])   . Asthma    as a child  . Coronary artery disease   . DDD (degenerative disc disease), lumbar   . Degenerative joint disease   . Dizziness and giddiness   . Dysrhythmia    A fib  . Episodic atrial fibrillation ([place])   . Esophageal reflux   . GERD (gastroesophageal reflux disease)   . Hiatal hernia   . History of kidney stones   . Hypertension, essential   . Kidney stones   . Liver cyst LEFT  . Myocardial infarction ([place])   . Nodule of parotid gland   .  Pneumonia   . PONV (postoperative nausea and vomiting)   . Pulmonary nodule     PSH: Past Surgical History:  Procedure Laterality Date  . BREAST BIOPSY Bilateral   . CARPAL TUNNEL RELEASE Right 09/2013  . CATARACTS Bilateral   . CERVICAL SPINE SURGERY  04/2012  . FINGER SURGERY Right    TRIGGER FINGER RELEASE  . PARTIAL THYMECTOMY  1973   pt states doesn't remember this being done  . TONSILLECTOMY  1964  . VAGINAL HYSTERECTOMY  1974    SH: Social History  Substance Use Topics  . Smoking status: Former Smoker    Types: Cigarettes    Quit date: 10/23/2005  . Smokeless tobacco: Never Used  . Alcohol use No    MEDS: Prior to Admission medications   Medication Sig Start Date End Date Taking? Authorizing Provider  aspirin EC 81 MG tablet Take 81 mg by mouth daily.   Yes [provider]  atorvastatin (LIPITOR) 80 MG tablet TAKE 1 TABLET BY MOUTH IN THE AFTERNOON 06/30/16  Yes [provider]  BRILINTA 90 MG TABS tablet TAKE 1 TABLET BY MOUTH TWICE DAILY 06/30/16  Yes [provider]  Cholecalciferol (VITAMIN D3) 2000 units TABS Take 2,000 Units by mouth daily.   Yes [provider]  Cyanocobalamin (VITAMIN B-12) 5000 MCG SUBL Place 5,000 mcg under the tongue daily.    Yes [provider]  esomeprazole (NEXIUM) 40 MG capsule Take 40 mg by  mouth daily. 07/27/16  Yes [provider]  hydrochlorothiazide (HYDRODIURIL) 12.5 MG tablet TAKE 1 TABLET BY MOUTH EVERY MORNING 06/30/16  Yes [provider]  meclizine (ANTIVERT) 25 MG tablet Take 25 mg by mouth 3 (three) times daily as needed for dizziness.   Yes [provider]  metoprolol tartrate (LOPRESSOR) 25 MG tablet Take 25 mg by mouth 2 (two) times daily.    Yes [provider]  nitroGLYCERIN (NITROSTAT) 0.4 MG SL tablet Place 0.4 mg under the tongue every 5 (five) minutes as needed for chest pain.    [provider]    ALLERGY: Allergies  Allergen  Reactions  . No Known Allergies     ROS: ROS  NEUROLOGIC EXAM: Awake, alert, oriented Memory and concentration grossly intact Speech fluent, appropriate Complete left facial droop, No hearing left ear, decreased sensation LT left V1/2/3 Motor exam: Upper Extremities Deltoid Bicep Tricep Grip  Right 5/5 5/5 5/5 5/5  Left 5/5 5/5 5/5 5/5   Lower Extremity IP Quad PF DF EHL  Right 5/5 5/5 5/5 5/5 5/5  Left 5/5 5/5 5/5 5/5 5/5   Sensation grossly intact to LT  [facility]: MRI demonstrates enhancement of the seventh eighth nerve complex within the temporal bone as well as cisternal segments, and a large homogeneously enhancing mass within the brainstem at the level of the middle cerebellar peduncle measuring greater than 2 cm. There is mild surrounding vasogenic edema, and some effacement of the fourth ventricle.  IMPRESSION: - 71 y.o. female with rapidly enlarging left-sided CP angle lesion including enhancement of the seventh eighth nerve complex, and progressive hearing loss and facial droop. Certainly the concern is for a underlying malignancy.  PLAN: - Retrosigmoid craniectomy for biopsy/resection of left-sided CPA mass  I have reviewed in detail with the patient and her daughter the indications for surgery, as well as the risks and benefits. All questions were answered. Informed consent was obtained.

## 2016-09-04 NOTE — Anesthesia Procedure Notes (Signed)
Procedure Name: Intubation Date/Time: 09/04/2016 8:01 AM Performed by: Garrison Columbus T Pre-anesthesia Checklist: Patient identified, Emergency Drugs available, Suction available and Patient being monitored Patient Re-evaluated:Patient Re-evaluated prior to inductionOxygen Delivery Method: Circle System Utilized Preoxygenation: Pre-oxygenation with 100% oxygen Intubation Type: IV induction Ventilation: Mask ventilation without difficulty and Oral airway inserted - appropriate to patient size Laryngoscope Size: Sabra Heck and 2 Grade View: Grade I Tube type: Subglottic suction tube Tube size: 7.5 mm Number of attempts: 1 Airway Equipment and Method: Stylet and Oral airway Placement Confirmation: ETT inserted through vocal cords under direct vision,  positive ETCO2 and breath sounds checked- equal and bilateral Secured at: 22 cm Tube secured with: Tape Dental Injury: Teeth and Oropharynx as per pre-operative assessment

## 2016-09-04 NOTE — Progress Notes (Signed)
Pt seen postoperatively in PACU.   O/E: Eyes open, partial left ptosis Left facial droop FC, MAE well Headwrap c/d/i  IMPRESSION: 71 y.o. 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

## 2016-09-05 ENCOUNTER — Inpatient Hospital Stay (HOSPITAL_COMMUNITY): Payer: Medicare Other

## 2016-09-05 MED ORDER — BOOST / RESOURCE BREEZE PO LIQD
1.0000 | Freq: Three times a day (TID) | ORAL | Status: DC
  Administered 2016-09-05 – 2016-09-09 (×11): 1 via ORAL
  Filled 2016-09-05 (×10): qty 1

## 2016-09-05 MED ORDER — GADOBENATE DIMEGLUMINE 529 MG/ML IV SOLN
15.0000 mL | Freq: Once | INTRAVENOUS | Status: AC
Start: 2016-09-05 — End: 2016-09-05
  Administered 2016-09-05: 15 mL via INTRAVENOUS

## 2016-09-05 NOTE — Progress Notes (Signed)
Patient ID: Kimberly Vaughn, female   DOB: Oct 24, 1945, 71 y.o.   MRN: [id number] BP (!) 118/48   Pulse 77   Temp 98.5 F (36.9 C) (Oral)   Resp 13   Ht 5\' 1"  (1.549 m)   Wt 84.2 kg (185 lb 10 oz)   SpO2 98%   BMI 35.07 kg/m  Alert, oriented x 4, speech is clear Left facial droop, decreased hearing left ear Diplopia Dressing is dry MRI shows near complete resection, no complications

## 2016-09-05 NOTE — Progress Notes (Signed)
Brilinta order discontinued per Dr. Christella Noa.

## 2016-09-05 NOTE — Progress Notes (Signed)
Initial Nutrition Assessment  DOCUMENTATION CODES:   Obesity unspecified  INTERVENTION:   -Boost Breeze po TID, each supplement provides 250 kcal and 9 grams of protein -RD will follow for diet advancement and adjust supplement regimen accordingly  NUTRITION DIAGNOSIS:   Increased nutrient needs related to  (post-op healing) as evidenced by estimated needs.  GOAL:   Patient will meet greater than or equal to 90% of their needs  MONITOR:   PO intake, Supplement acceptance, Diet advancement, Labs, Weight trends, Skin, I & O's  REASON FOR ASSESSMENT:   Malnutrition Screening Tool    ASSESSMENT:   71 y.o. female with rapidly enlarging left-sided CP angle lesion including enhancement of the seventh eighth nerve complex, and progressive hearing loss and facial droop. Certainly the concern is for a underlying malignancy.  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   Unable to speak with pt at this time. Unable to complete Nutrition-Focused physical exam at this time.   Wt hx reviewed; noted wt stability over the past past year. UBW 181#.   Pt on a clear liquid diet and with increased nutrient needs due to post-op healing. RD will add Boost Breeze supplement.   Labs reviewed: CBGS: 150.   Diet Order:  Diet clear liquid Room service appropriate? Yes; Fluid consistency: Thin  Skin:   (closed lt head incision)  Last BM:  PTA  Height:   Ht Readings from Last 1 Encounters:  09/04/16 5\' 1"  (1.549 m)    Weight:   Wt Readings from Last 1 Encounters:  09/04/16 185 lb 10 oz (84.2 kg)    Ideal Body Weight:  47.7 kg  BMI:  Body mass index is 35.07 kg/m.  Estimated Nutritional Needs:   Kcal:  1700-1900  Protein:  95-110 grams  Fluid:  >1.7 L  EDUCATION NEEDS:   No education needs identified at this time  Carrieann Spielberg A. Jimmye Norman, RD, LDN,  CDE Pager: [phone] After hours Pager: [phone]

## 2016-09-06 ENCOUNTER — Inpatient Hospital Stay (HOSPITAL_COMMUNITY): Payer: Medicare Other

## 2016-09-06 NOTE — Evaluation (Signed)
Clinical/Bedside Swallow Evaluation Patient Details  Name: Kimberly Vaughn MRN: [id number] Date of Birth: [birth year]  Today's Date: 09/06/2016 Time: SLP Start Time (ACUTE ONLY): 3875 SLP Stop Time (ACUTE ONLY): 0906 SLP Time Calculation (min) (ACUTE ONLY): 32 min  Past Medical History:  Past Medical History:  Diagnosis Date  . A-fib ([place])   . Anginal pain ([place])   . Asthma    as a child  . Coronary artery disease   . DDD (degenerative disc disease), lumbar   . Degenerative joint disease   . Dizziness and giddiness   . Dysrhythmia    A fib  . Episodic atrial fibrillation ([place])   . Esophageal reflux   . GERD (gastroesophageal reflux disease)   . Hiatal hernia   . History of kidney stones   . Hypertension, essential   . Kidney stones   . Liver cyst LEFT  . Myocardial infarction ([place])   . Nodule of parotid gland   . Pneumonia   . PONV (postoperative nausea and vomiting)   . Pulmonary nodule    Past Surgical History:  Past Surgical History:  Procedure Laterality Date  . BREAST BIOPSY Bilateral   . CARPAL TUNNEL RELEASE Right 09/2013  . CATARACTS Bilateral   . CERVICAL SPINE SURGERY  04/2012  . FINGER SURGERY Right    TRIGGER FINGER RELEASE  . PARTIAL THYMECTOMY  1973   pt states doesn't remember this being done  . TONSILLECTOMY  1964  . VAGINAL HYSTERECTOMY  1974   HPI:  Kimberly Blann Hamletis a 71 y.o.femaleI'm seeing for rapid progression of left-sided hearing loss and facial droop. She initially presented back in Nov with fairly rapid progression of hearing loss. She then developed rapid progression of facial droop in Jan. Initial MRI demonstrated enhancement of the this tibial cochlear nerve complex as well as the facial nerve primarily within the temporal bone. Repeat MRI a few months later in May, demonstrated increased enhancement of the nerve, as well as a very small nodular enhancement at the root entry zone in the brainstem. Unfortunately, she had had cardiac stent  placement back in January, and was on aspirin and Brilinta. At that point, she presented to the outpatient neurosurgery clinic. Given the recent cardiac incident and her antiplatelet agents, we elected to proceed with close radiologic observation. About 6 weeks later, I repeated the brain MRI which demonstrated marked enlargement of the enhancing nodule at the brainstem, now measuring more than 2 cm. With that new finding, certainly the concern was for some kind of malignancy. At this point, she had also started to complain of left-sided facial numbness including all 3 distributions of the trigeminal nerve. We therefore elected to proceed with surgery with a goal of diagnosis, and conceivably some reduction of mass effect. 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  Post op MRI is showing post op changes with subtotal resection of mass centered at the left cerebellopontine angle/brachium pontis.     Assessment / Plan / Recommendation Clinical Impression  Clinical swallowing evaluation and oral motor exam were completed.  The patient reported long standing left sided facial droop and numbness as well as numbness on the left side of her tongue. She also reported reduced taste.  She reports that after surgery her taste has improved and everything else has generally stayed the same except for some improved ability to close her eyes.  During oral mechanism exam the patient was  noted to have significant left sided facial and labial droop/weakness.  She was unable to completely close her left eye and there is no movement noted on the left side when the patient attempted to retract her lips.   Patient had good lingual range of motion and strength.  Patient also with reduced left lingual and left facial sensation.  Patient noted to have no sensation on the left side of her face right along side her nose.  Otherwise she is  able to note feeling but reported that it is just decreased as compared to the otherside both on her face and tongue.  Given oral intake no labial loss from the left was noted but the patient does complain of this happening.  Delayed oral transit and swallow trigger were noted.  Hyo-laryngeal movement was appreciated.  No immediate coughing was noted with intake but patient was noted to burp and almost have to regurgitate material.  Patient reported this as a new sensation that has just occurred after surgery.  Recommend MBS to determine current swallowing physiology and the possible cause to her current symptoms.   SLP Visit Diagnosis: Dysphagia, oropharyngeal phase (R13.12)    Aspiration Risk  Mild aspiration risk    Diet Recommendation   Continue current diet pending results of MBS.    Medication Administration: Crushed with puree    Other  Recommendations Oral Care Recommendations: Oral care BID;Oral care before and after PO   Follow up Recommendations Other (comment) (TBD)        Swallow Study   General Date of Onset: 09/04/16 HPI: Kimberly Herandez Hamletis a 71 y.o.femaleI'm seeing for rapid progression of left-sided hearing loss and facial droop. She initially presented back in Nov with fairly rapid progression of hearing loss. She then developed rapid progression of facial droop in Jan. Initial MRI demonstrated enhancement of the this tibial cochlear nerve complex as well as the facial nerve primarily within the temporal bone. Repeat MRI a few months later in May, demonstrated increased enhancement of the nerve, as well as a very small nodular enhancement at the root entry zone in the brainstem. Unfortunately, she had had cardiac stent placement back in January, and was on aspirin and Brilinta. At that point, she presented to the outpatient neurosurgery clinic. Given the recent cardiac incident and her antiplatelet agents, we elected to proceed with close radiologic observation. About 6 weeks  later, I repeated the brain MRI which demonstrated marked enlargement of the enhancing nodule at the brainstem, now measuring more than 2 cm. With that new finding, certainly the concern was for some kind of malignancy. At this point, she had also started to complain of left-sided facial numbness including all 3 distributions of the trigeminal nerve. We therefore elected to proceed with surgery with a goal of diagnosis, and conceivably some reduction of mass effect. 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  Post op MRI is showing post op changes with subtotal resection of mass centered at the left cerebellopontine angle/brachium pontis.   Type of Study: Bedside Swallow Evaluation Previous Swallow Assessment: None noted previously at [facility]. Diet Prior to this Study: Regular;Thin liquids Temperature Spikes Noted: No Respiratory Status: Room air History of Recent Intubation: Yes Length of Intubations (days):  (4 hours for surgery only.) Date extubated: 09/04/16 Behavior/Cognition: Alert;Cooperative;Pleasant mood Oral Cavity Assessment: Within Functional Limits Oral Care Completed by SLP: No Vision: Functional for self-feeding Self-Feeding Abilities: Able to feed  self Patient Positioning: Upright in bed Baseline Vocal Quality: Normal Volitional Cough: Weak Volitional Swallow: Able to elicit    Oral/Motor/Sensory Function Overall Oral Motor/Sensory Function: Moderate impairment Facial ROM: Reduced left;Suspected CN VII (facial) dysfunction Facial Symmetry: Abnormal symmetry left;Suspected CN VII (facial) dysfunction Facial Strength: Reduced left;Suspected CN VII (facial) dysfunction Facial Sensation: Reduced left;Suspected CN V (Trigeminal) dysfunction Lingual ROM: Within Functional Limits Lingual Symmetry: Within Functional Limits Lingual Strength: Within Functional Limits Lingual Sensation:  Reduced;Suspected CN VII (facial) dysfunction-anterior 2/3 tongue Mandible: Within Functional Limits   Ice Chips Ice chips: Not tested   Thin Liquid Thin Liquid: Impaired Presentation: Cup;Spoon Pharyngeal  Phase Impairments: Multiple swallows (burping noted)    Nectar Thick Nectar Thick Liquid: Not tested   Honey Thick Honey Thick Liquid: Not tested   Puree Puree: Impaired Presentation: Spoon Pharyngeal Phase Impairments: Multiple swallows (burping noted)   Solid   GO   Solid: Not tested    Functional Limitations: Swallowing    Shelly Flatten, MA, CCC-SLP Acute Rehab SLP [phone]  Lamar Sprinkles 09/06/2016,9:19 AM

## 2016-09-06 NOTE — Progress Notes (Signed)
Patient ID: Kimberly Vaughn, female   DOB: November 22, 1945, 71 y.o.   MRN: [id number] BP (!) 142/84   Pulse 86   Temp 98.1 F (36.7 C) (Axillary)   Resp 18   Ht 5\' 1"  (1.549 m)   Wt 84.2 kg (185 lb 10 oz)   SpO2 100%   BMI 35.07 kg/m  Alert and oriented x 4 Speech is dysarthric, left facial droop, no diplopia noted today Pupils are round and reactive to light Moving all extremities well Dressing is dry Continue decadron taper

## 2016-09-06 NOTE — Progress Notes (Signed)
Modified Barium Swallow Progress Note  Patient Details  Name: Emojean Gertz MRN: [id number] Date of Birth: [birth year]  Today's Date: 09/06/2016  Modified Barium Swallow completed.  Full report located under Chart Review in the Imaging Section.  Brief recommendations include the following:  Clinical Impression  MBS was completed using thin liquids via tsp, cup and straw, nectar thick liquids via tsp, honey thick via tsp, pureed material and dual textured solids.  The patient was noted to have a mild oral and mild-moderate pharyngeal dysphagia.  The oral phase was characterized by delayed oral transit given dual textured solids that was most likely  related to lack of dentition, premature loss of the bolus given dual textured solids and mild oral residue across textures.  The pharyngeal phase was characterized by a swallow trigger in the pyriform sinuses for all liquid textures and a swallow trigger in the valleculae given pureed material and dual textured solids.  Silent penetration and aspiration to just the underside of the vocal cords occured prior to the swallow given all liquid textures via tsp sips.  Patient noted to make a "gaggy" noise with the penetration/aspiration but no true throat clear or cough was seen.  Volitional cough was noted to facilitate clearance of material from laryngeal area.   Use of a chin tuck helped to prevent the penetration/aspiration given tsp, cup and single straw sips of thin liquids.   Esophageal sweep revealed it mildly slow to clear.  Patient also complained of nasal regurgitation.  Velopharyngeal closure appeared to be adequate during the MBS.  Recommend that the patient have a regular diet with thin liquids.  Patient MUST use a chin tuck for all sips of liquids and should take one sip at a time.  She may use straws as long as she takes one sip at a time.  The patient would benefit from ST follow up at next level of care to address dysphagia.  ST to follow during  acute stay.     Swallow Evaluation Recommendations       SLP Diet Recommendations: Regular solids;Thin liquid CHIN TUCK FOR ALL LIQUIDS!!   Liquid Administration via: Cup;Straw   Medication Administration: Whole meds with puree   Supervision: Patient able to self feed   Compensations: Slow rate;Small sips/bites;Chin tuck;Use straw to facilitate chin tuck   Postural Changes: Seated upright at 90 degrees;Remain semi-upright after after feeds/meals (Comment)   Oral Care Recommendations: Oral care BID       Shelly Flatten, MA, CCC-SLP Acute Rehab SLP [phone]  Lamar Sprinkles 09/06/2016,11:22 AM

## 2016-09-07 ENCOUNTER — Encounter (HOSPITAL_COMMUNITY): Payer: Self-pay | Admitting: *Deleted

## 2016-09-07 DIAGNOSIS — E876 Hypokalemia: Secondary | ICD-10-CM

## 2016-09-07 DIAGNOSIS — I2510 Atherosclerotic heart disease of native coronary artery without angina pectoris: Secondary | ICD-10-CM

## 2016-09-07 DIAGNOSIS — D62 Acute posthemorrhagic anemia: Secondary | ICD-10-CM

## 2016-09-07 DIAGNOSIS — I252 Old myocardial infarction: Secondary | ICD-10-CM

## 2016-09-07 DIAGNOSIS — D496 Neoplasm of unspecified behavior of brain: Principal | ICD-10-CM

## 2016-09-07 DIAGNOSIS — G939 Disorder of brain, unspecified: Secondary | ICD-10-CM

## 2016-09-07 DIAGNOSIS — G8918 Other acute postprocedural pain: Secondary | ICD-10-CM

## 2016-09-07 LAB — TYPE AND SCREEN
ABO/RH(D): O POS
Antibody Screen: NEGATIVE
UNIT DIVISION: 0
UNIT DIVISION: 0

## 2016-09-07 LAB — BPAM RBC
Blood Product Expiration Date: 201807252359
Blood Product Expiration Date: 201807252359
ISSUE DATE / TIME: 201807061021
ISSUE DATE / TIME: 201807061021
UNIT TYPE AND RH: 5100
Unit Type and Rh: 5100

## 2016-09-07 LAB — BASIC METABOLIC PANEL
ANION GAP: 8 (ref 5–15)
BUN: 12 mg/dL (ref 6–20)
CHLORIDE: 101 mmol/L (ref 101–111)
CO2: 30 mmol/L (ref 22–32)
Calcium: 8.6 mg/dL — ABNORMAL LOW (ref 8.9–10.3)
Creatinine, Ser: 0.6 mg/dL (ref 0.44–1.00)
GLUCOSE: 148 mg/dL — AB (ref 65–99)
POTASSIUM: 2.8 mmol/L — AB (ref 3.5–5.1)
Sodium: 139 mmol/L (ref 135–145)

## 2016-09-07 LAB — MAGNESIUM: MAGNESIUM: 1.9 mg/dL (ref 1.7–2.4)

## 2016-09-07 LAB — PHOSPHORUS: Phosphorus: 2.3 mg/dL — ABNORMAL LOW (ref 2.5–4.6)

## 2016-09-07 MED ORDER — ASPIRIN EC 81 MG PO TBEC
81.0000 mg | DELAYED_RELEASE_TABLET | Freq: Every day | ORAL | Status: DC
  Administered 2016-09-07 – 2016-09-09 (×3): 81 mg via ORAL
  Filled 2016-09-07 (×3): qty 1

## 2016-09-07 MED ORDER — POTASSIUM CHLORIDE 10 MEQ/100ML IV SOLN
INTRAVENOUS | Status: AC
  Filled 2016-09-07: qty 100

## 2016-09-07 MED ORDER — POTASSIUM CHLORIDE 10 MEQ/100ML IV SOLN
10.0000 meq | INTRAVENOUS | Status: AC
  Administered 2016-09-07 (×4): 10 meq via INTRAVENOUS
  Filled 2016-09-07 (×3): qty 100

## 2016-09-07 MED FILL — Thrombin For Soln 20000 Unit: CUTANEOUS | Qty: 1 | Status: AC

## 2016-09-07 MED FILL — Gelatin Absorbable Sponge Size 100: CUTANEOUS | Qty: 1 | Status: AC

## 2016-09-07 NOTE — Anesthesia Postprocedure Evaluation (Signed)
Anesthesia Post Note  Patient: Kimberly Vaughn  Procedure(s) Performed: Procedure(s) (LRB): RETROSIGMOID CRANIECTOMY FOR TUMOR RESECTION WITH BRAINLAB NAVIGATION (Left) APPLICATION OF CRANIAL NAVIGATION (N/A)     Patient location during evaluation: PACU Anesthesia Type: General Level of consciousness: awake and alert Pain management: pain level controlled Vital Signs Assessment: post-procedure vital signs reviewed and stable Respiratory status: spontaneous breathing, nonlabored ventilation, respiratory function stable and patient connected to nasal cannula oxygen Cardiovascular status: blood pressure returned to baseline and stable Postop Assessment: no signs of nausea or vomiting Anesthetic complications: no    Last Vitals:  Vitals:   09/07/16 0747 09/07/16 0800  BP:  (!) 184/86  Pulse:  78  Resp:  (!) 21  Temp: 36.9 C     Last Pain:  Vitals:   09/07/16 0800  TempSrc:   PainSc: 0-No pain                 Montez Hageman

## 2016-09-07 NOTE — Progress Notes (Signed)
Nutrition Follow-up  DOCUMENTATION CODES:   Obesity unspecified  INTERVENTION:   Continue:  Boost Breeze po TID, each supplement provides 250 kcal and 9 grams of protein   NUTRITION DIAGNOSIS:   Increased nutrient needs related to  (post-op healing) as evidenced by estimated needs. Ongoing.   GOAL:   Patient will meet greater than or equal to 90% of their needs Progressing.   MONITOR:   PO intake, Supplement acceptance, Diet advancement, Labs, Weight trends, Skin, I & O's  ASSESSMENT:   71 y.o. female with rapidly enlarging left-sided CP angle lesion including enhancement of the seventh eighth nerve complex, and progressive hearing loss and facial droop. Certainly the concern is for a underlying malignancy.  Retrosigmoid craniectomy for biopsy/resection of left-sided CPA mass  Per pt and family pt with poor appetite 1 week PTA due to severe dizziness, N/V. She endorses weight loss of 13 lb, some of which is dehydration. Overall she has lost about 9% of her weight in about a year. Pt likes Boost Breeze.   Medications reviewed and include: decadron, colace, B12, potassium Labs reviewed: K+ 2.8 (L)   Diet Order:  DIET DYS 3 Room service appropriate? Yes with Assist; Fluid consistency: Thin  Skin:   (closed lt head incision)  Last BM:  PTA  Height:   Ht Readings from Last 1 Encounters:  09/04/16 5\' 1"  (1.549 m)    Weight:   Wt Readings from Last 1 Encounters:  09/04/16 185 lb 10 oz (84.2 kg)    Ideal Body Weight:  47.7 kg  BMI:  Body mass index is 35.07 kg/m.  Estimated Nutritional Needs:   Kcal:  1700-1900  Protein:  95-110 grams  Fluid:  >1.7 L  EDUCATION NEEDS:   No education needs identified at this time  [place], [place], [place] Pager [phone] After Hours Pager

## 2016-09-07 NOTE — Progress Notes (Signed)
Rehab Admissions Coordinator Note:  Patient was screened by Cleatrice Burke for appropriateness for an Inpatient Acute Rehab Consult per therapy recommendations.  At this time, we are recommending Inpatient Rehab consult. Please place order.  Cleatrice Burke 09/07/2016, 9:41 AM  I can be reached at [phone].

## 2016-09-07 NOTE — Progress Notes (Signed)
Patient arrived to unit, daughter at her side. Will continue to monitor.

## 2016-09-07 NOTE — Plan of Care (Signed)
Problem: Physical Regulation: Goal: Neurologic status will improve Outcome: Progressing Alert, oriented x4, no s/s of neuro deficits noted Goal: Postoperative complications will be avoided or minimized Outcome: Progressing No post operative complications noted  Problem: Skin Integrity: Goal: Demonstration of wound healing without infection will improve Outcome: Progressing Incision to beck of left hear healing well, no s/s of infection noted, patient denied pain

## 2016-09-07 NOTE — Consult Note (Signed)
Physical Medicine and Rehabilitation Consult  Reason for Consult: Balance deficits with dysphagia due to facial/vestibularcochlear nerve mass extending into brachium pontis Referring Physician: Dr. Kathyrn Sheriff.    HPI: Kimberly Vaughn is a 71 y.o. 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 History taken from chart review and patient. Patient with CAD and MI requiring stent 03/2016  therefore surgery postponed with close observation. Follow up MRI showed marked enhancement of brainstem nodule with left facial numbness involving all three distributions of trigeminal nerve and concerns of malignancy and she was cleared for surgery by cards.  She was admitted on 09/04/16 for retrosigmoid craniectomy for tumor resection by Dr. Kathyrn Sheriff. Swallow evaluation done due to significant facial weakness and numbness--regular,thins with chin tuck recommended to prevent aspiration/penetration. Post-op MRI reviewed, showing partial resection of brainstem tumor. PT/OT evaluations done today showing significant balance deficits, dizziness/nausea with activity and ataxic gait. CIR recommended due to deficits in mobility and ability to carry out ADL tasks.    She was independent with cane PTA. She was still managing home, cooking and was driving till two weeks PTA.   Review of Systems  HENT: Positive for hearing loss (left ear). Negative for ear pain and tinnitus.   Eyes: Positive for blurred vision and double vision.  Cardiovascular: Negative for chest pain, palpitations and leg swelling.  Gastrointestinal: Positive for nausea (with movement--ongoing). Negative for abdominal pain and heartburn.  Genitourinary: Negative for dysuria and urgency.  Musculoskeletal: Positive for back pain. Negative for myalgias.  Skin: Negative for itching and rash.  Neurological: Positive for dizziness (a little worse), sensory  change, speech change and focal weakness. Negative for headaches.  Psychiatric/Behavioral: Negative for memory loss. The patient does not have insomnia.   All other systems reviewed and are negative.     Past Medical History:  Diagnosis Date  . A-fib ([place])   . Anginal pain ([place])   . Asthma    as a child  . Coronary artery disease   . DDD (degenerative disc disease), lumbar   . Degenerative joint disease   . Dizziness and giddiness   . Dysrhythmia    A fib  . Episodic atrial fibrillation ([place])   . Esophageal reflux   . GERD (gastroesophageal reflux disease)   . Hiatal hernia   . History of kidney stones   . Hypertension, essential   . Kidney stones   . Liver cyst LEFT  . Myocardial infarction ([place])   . Nodule of parotid gland   . Pneumonia   . PONV (postoperative nausea and vomiting)   . Pulmonary nodule   . PVC (premature ventricular contraction)    Nov 2017 & Feb 2018 noted on cardiac monitor worn at home    Past Surgical History:  Procedure Laterality Date  . APPLICATION OF CRANIAL NAVIGATION N/A 09/04/2016   Procedure: APPLICATION OF CRANIAL NAVIGATION;  Surgeon: Consuella Lose, MD;  Location: [facility];  Service: Neurosurgery;  Laterality: N/A;  . BREAST BIOPSY Bilateral   . CARPAL TUNNEL RELEASE Right 09/2013  . CATARACTS Bilateral   . CERVICAL SPINE SURGERY  04/2012  . FINGER SURGERY Right    TRIGGER FINGER RELEASE  . PARTIAL THYMECTOMY  1973   pt states doesn't remember this being done  . RETROSIGMOID CRANIECTOMY FOR TUMOR RESECTION Left 09/04/2016   Procedure: RETROSIGMOID CRANIECTOMY FOR TUMOR RESECTION WITH BRAINLAB NAVIGATION;  Surgeon: Consuella Lose,  MD;  Location: [facility];  Service: Neurosurgery;  Laterality: Left;  CRANIOTOMY TUMOR EXCISION  . TONSILLECTOMY  1964  . VAGINAL HYSTERECTOMY  1974    Family History  Problem Relation Age of Onset  . Heart disease Mother 31  . Psoriasis Mother        PUSTULAR  . Heart attack Father 70  . Heart disease  Father   . Lymphoma Sister   . Melanoma Brother   . Hypertension Daughter   . Lymphoma Other   . Osteoarthritis Other   . Melanoma Other   . Hypertension Other     Social History:  Married. Husband with medical issues and can provide supervision only. Retired three years ago. Per reports that she quit smoking about 10 years ago. Her smoking use included Cigarettes. She has never used smokeless tobacco. She reports that she does not drink alcohol or use drugs.    Allergies  Allergen Reactions  . No Known Allergies     Medications Prior to Admission  Medication Sig Dispense Refill  . aspirin EC 81 MG tablet Take 81 mg by mouth daily.    Marland Kitchen atorvastatin (LIPITOR) 80 MG tablet TAKE 1 TABLET BY MOUTH IN THE AFTERNOON  6  . BRILINTA 90 MG TABS tablet TAKE 1 TABLET BY MOUTH TWICE DAILY  6  . Cholecalciferol (VITAMIN D3) 2000 units TABS Take 2,000 Units by mouth daily.    . Cyanocobalamin (VITAMIN B-12) 5000 MCG SUBL Place 5,000 mcg under the tongue daily.     Marland Kitchen esomeprazole (NEXIUM) 40 MG capsule Take 40 mg by mouth daily.  12  . hydrochlorothiazide (HYDRODIURIL) 12.5 MG tablet TAKE 1 TABLET BY MOUTH EVERY MORNING  6  . meclizine (ANTIVERT) 25 MG tablet Take 25 mg by mouth 3 (three) times daily as needed for dizziness.    . metoprolol tartrate (LOPRESSOR) 25 MG tablet Take 25 mg by mouth 2 (two) times daily.     . nitroGLYCERIN (NITROSTAT) 0.4 MG SL tablet Place 0.4 mg under the tongue every 5 (five) minutes as needed for chest pain.      Home: Home Living Family/patient expects to be discharged to:: Private residence Living Arrangements: Spouse/significant other Available Help at Discharge: Family, Available 24 hours/day Type of Home: Mobile home Home Access: Stairs to enter, Ramped entrance Technical brewer of Steps: 1 Home Layout: One level Bathroom Shower/Tub: Multimedia programmer: Standard Bathroom Accessibility: Yes Home Equipment: Shower seat, Radio producer - single  point, Grab bars - tub/shower, Grab bars - toilet, Hand held shower head (walking stick in addition to cane)  Functional History: Prior Function Level of Independence: Independent with assistive device(s) Comments: Used the walking stick and furniture cruised for support Functional Status:  Mobility: Bed Mobility Overal bed mobility: Needs Assistance Bed Mobility: Supine to Sit Supine to sit: Min assist General bed mobility comments: Light assist provided for balance as pt anteriorly leaned and scooted forward to EOB. Increased time required. Pt with gagging upon achieving sit - reports she has been nauseated.  Transfers Overall transfer level: Needs assistance Equipment used: Rolling walker (2 wheeled) Transfers: Sit to/from Stand Sit to Stand: Min assist, +2 physical assistance General transfer comment: Assist for power-up to full stand and to gain/maintain balance. VC's required for hand placement on seated surface for safety.  Ambulation/Gait Ambulation/Gait assistance: Min assist, +2 physical assistance, +2 safety/equipment Ambulation Distance (Feet): 15 Feet (7', 8') Assistive device: Rolling walker (2 wheeled) Gait Pattern/deviations: Step-through pattern, Decreased stride length, Trunk  flexed, Antalgic General Gait Details: +2 assist required for balance support and safety. Therapists supported patient at the gait belt as well as supported the walker. Pt ambulated to the bathroom to void, and then washed hands at sink and ambulated to the recliner chair.  Gait velocity: Decreased Gait velocity interpretation: Below normal speed for age/gender    ADL: ADL Overall ADL's : Needs assistance/impaired Eating/Feeding: Set up, Sitting, Minimal assistance Grooming: Standing, Moderate assistance Grooming Details (indicate cue type and reason): Mod assist to maintain balance.  Upper Body Bathing: Moderate assistance, Sitting Lower Body Bathing: Moderate assistance, Sit to/from  stand Upper Body Dressing : Moderate assistance, Sitting Lower Body Dressing: Moderate assistance, Sit to/from stand Toilet Transfer: Moderate assistance, +2 for safety/equipment, RW Toilet Transfer Details (indicate cue type and reason): VC's for safety and use of RW.  Toileting- Clothing Manipulation and Hygiene: Moderate assistance, Sit to/from stand Toileting - Clothing Manipulation Details (indicate cue type and reason): Mod assist for safety and VC's for technique.  Functional mobility during ADLs: Moderate assistance, +2 for safety/equipment, Rolling walker General ADL Comments: Pt limited by decreased L sided coordination.   Cognition: Cognition Overall Cognitive Status: Within Functional Limits for tasks assessed Orientation Level: Oriented X4 Cognition Arousal/Alertness: Awake/alert Behavior During Therapy: WFL for tasks assessed/performed Overall Cognitive Status: Within Functional Limits for tasks assessed   Blood pressure 103/87, pulse 88, temperature 98.4 F (36.9 C), temperature source Oral, resp. rate 16, height 5\' 1"  (1.549 m), weight 84.2 kg (185 lb 10 oz), SpO2 95 %. Physical Exam  Nursing note and vitals reviewed. Constitutional: She is oriented to person, place, and time. She appears well-developed and well-nourished.  HENT:  Head: Normocephalic.  Mouth/Throat: Oropharynx is clear and moist.  Left crani incision clean, dry and intact.   Eyes: Conjunctivae are normal. Pupils are equal, round, and reactive to light.  Left eye deviation  Neck: Normal range of motion. Neck supple.  Cardiovascular: Normal rate and regular rhythm.   Respiratory: Effort normal and breath sounds normal. No stridor. No respiratory distress. She has no wheezes.  GI: Soft. Bowel sounds are normal. She exhibits no distension. There is no tenderness.  Musculoskeletal: She exhibits no edema or tenderness.  Neurological: She is alert and oriented to person, place, and time.  Left facial  weakness with numbness and hearing loss. Dysarthria.  Able to follow motor commands without difficulty.  RUE/RLE: 5/5 LUE/LLE 4+/5 LUE ataxia  Skin: Skin is warm and dry.  Psychiatric: She has a normal mood and affect. Her behavior is normal. Judgment and thought content normal.    Results for orders placed or performed during the hospital encounter of 09/04/16 (from the past 24 hour(s))  Magnesium     Status: None   Collection Time: 09/07/16  3:42 AM  Result Value Ref Range   Magnesium 1.9 1.7 - 2.4 mg/dL  Phosphorus     Status: Abnormal   Collection Time: 09/07/16  3:42 AM  Result Value Ref Range   Phosphorus 2.3 (L) 2.5 - 4.6 mg/dL  Basic metabolic panel     Status: Abnormal   Collection Time: 09/07/16  3:42 AM  Result Value Ref Range   Sodium 139 135 - 145 mmol/L   Potassium 2.8 (L) 3.5 - 5.1 mmol/L   Chloride 101 101 - 111 mmol/L   CO2 30 22 - 32 mmol/L   Glucose, Bld 148 (H) 65 - 99 mg/dL   BUN 12 6 - 20 mg/dL   Creatinine, Ser 0.60  0.44 - 1.00 mg/dL   Calcium 8.6 (L) 8.9 - 10.3 mg/dL   GFR calc non Af Amer >60 >60 mL/min   GFR calc Af Amer >60 >60 mL/min   Anion gap 8 5 - 15   Dg Swallowing Func-speech Pathology  Result Date: 09/06/2016 Objective Swallowing Evaluation: Type of Study: MBS-Modified Barium Swallow Study Patient Details Name: Quinesha Selinger MRN: [id number] Date of Birth: Sep 09, 1945 Today's Date: 09/06/2016 Time: SLP Start Time (ACUTE ONLY): 1020-SLP Stop Time (ACUTE ONLY): 1050 SLP Time Calculation (min) (ACUTE ONLY): 30 min Past Medical History: Past Medical History: Diagnosis Date . A-fib ([place])  . Anginal pain ([place])  . Asthma   as a child . Coronary artery disease  . DDD (degenerative disc disease), lumbar  . Degenerative joint disease  . Dizziness and giddiness  . Dysrhythmia   A fib . Episodic atrial fibrillation ([place])  . Esophageal reflux  . GERD (gastroesophageal reflux disease)  . Hiatal hernia  . History of kidney stones  . Hypertension, essential  . Kidney  stones  . Liver cyst LEFT . Myocardial infarction ([place])  . Nodule of parotid gland  . Pneumonia  . PONV (postoperative nausea and vomiting)  . Pulmonary nodule  Past Surgical History: Past Surgical History: Procedure Laterality Date . BREAST BIOPSY Bilateral  . CARPAL TUNNEL RELEASE Right 09/2013 . CATARACTS Bilateral  . CERVICAL SPINE SURGERY  04/2012 . FINGER SURGERY Right   TRIGGER FINGER RELEASE . PARTIAL THYMECTOMY  1973  pt states doesn't remember this being done . TONSILLECTOMY  1964 . VAGINAL HYSTERECTOMY  1974 HPI: Katryna Tschirhart [place]is a 71 y.o.femaleI'm seeing for rapid progression of left-sided hearing loss and facial droop. She initially presented back in Nov with fairly rapid progression of hearing loss. She then developed rapid progression of facial droop in Jan. Initial MRI demonstrated enhancement of the this tibial cochlear nerve complex as well as the facial nerve primarily within the temporal bone. Repeat MRI a few months later in May, demonstrated increased enhancement of the nerve, as well as a very small nodular enhancement at the root entry zone in the brainstem. Unfortunately, she had had cardiac stent placement back in January, and was on aspirin and Brilinta. At that point, she presented to the outpatient neurosurgery clinic. Given the recent cardiac incident and her antiplatelet agents, we elected to proceed with close radiologic observation. About 6 weeks later, I repeated the brain MRI which demonstrated marked enlargement of the enhancing nodule at the brainstem, now measuring more than 2 cm. With that new finding, certainly the concern was for some kind of malignancy. At this point, she had also started to complain of left-sided facial numbness including all 3 distributions of the trigeminal nerve. We therefore elected to proceed with surgery with a goal of diagnosis, and conceivably some reduction of mass effect. 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  Post op MRI is showing post op changes with subtotal resection of mass centered at the left cerebellopontine angle/brachium pontis.   Subjective: The patient was seen in radiology for determine current swallowing physiology.  Assessment / Plan / Recommendation CHL IP CLINICAL IMPRESSIONS 09/06/2016 Clinical Impression MBS was completed using thin liquids via tsp, cup and straw, nectar thick liquids via tsp, honey thick via tsp, pureed material and dual textured solids.  The patient was noted to have a mild oral and mild-moderate pharyngeal dysphagia.  The oral phase was  characterized by delayed oral transit given dual textured solids that was most likely  related to lack of dentition, premature loss of the bolus given dual textured solids and mild oral residue across textures.  The pharyngeal phase was characterized by a swallow trigger in the pyriform sinuses for all liquid textures and a swallow trigger in the valleculae given pureed material and dual textured solids.  Silent penetration and aspiration to just the underside of the vocal cords occured prior to the swallow given all liquid textures via tsp sips.  Patient noted to make a "gaggy" noise with the penetration/aspiration but no true throat clear or cough was seen.  Volitional cough was noted to facilitate clearance of material from laryngeal area.   Use of a chin tuck helped to prevent the penetration/aspiration given tsp, cup and single straw sips of thin liquids.   Esophageal sweep revealed it mildly slow to clear.  Patient also complained of nasal regurgitation.  Velopharyngeal closure appeared to be adequate during the MBS.  Recommend that the patient have a regular diet with thin liquids.  Patient MUST use a chin tuck for all sips of liquids and should take one sip at a time.  She may use straws as long as she takes one sip at a time.  The patient would benefit from ST follow up  at next level of care to address dysphagia.  ST to follow during acute stay.   SLP Visit Diagnosis Dysphagia, oropharyngeal phase (R13.12) Attention and concentration deficit following -- Frontal lobe and executive function deficit following -- Impact on safety and function Mild aspiration risk   CHL IP TREATMENT RECOMMENDATION 09/06/2016 Treatment Recommendations Therapy as outlined in treatment plan below   Prognosis 09/06/2016 Prognosis for Safe Diet Advancement Good Barriers to Reach Goals -- Barriers/Prognosis Comment -- CHL IP DIET RECOMMENDATION 09/06/2016 SLP Diet Recommendations Regular solids;Thin liquid Liquid Administration via Cup;Straw Medication Administration Whole meds with puree Compensations Slow rate;Small sips/bites;Chin tuck;Use straw to facilitate chin tuck Postural Changes Seated upright at 90 degrees;Remain semi-upright after after feeds/meals (Comment)   CHL IP OTHER RECOMMENDATIONS 09/06/2016 Recommended Consults -- Oral Care Recommendations Oral care BID Other Recommendations --   CHL IP FOLLOW UP RECOMMENDATIONS 09/06/2016 Follow up Recommendations Home health SLP   CHL IP FREQUENCY AND DURATION 09/06/2016 Speech Therapy Frequency (ACUTE ONLY) min 2x/week Treatment Duration 2 weeks      CHL IP ORAL PHASE 09/06/2016 Oral Phase Impaired Oral - Pudding Teaspoon -- Oral - Pudding Cup -- Oral - Honey Teaspoon -- Oral - Honey Cup -- Oral - Nectar Teaspoon -- Oral - Nectar Cup -- Oral - Nectar Straw -- Oral - Thin Teaspoon -- Oral - Thin Cup -- Oral - Thin Straw -- Oral - Puree -- Oral - Mech Soft -- Oral - Regular -- Oral - Multi-Consistency Delayed oral transit Oral - Pill -- Oral Phase - Comment --  CHL IP PHARYNGEAL PHASE 09/06/2016 Pharyngeal Phase Impaired Pharyngeal- Pudding Teaspoon -- Pharyngeal -- Pharyngeal- Pudding Cup -- Pharyngeal -- Pharyngeal- Honey Teaspoon Delayed swallow initiation-pyriform sinuses;Penetration/Aspiration before swallow Pharyngeal Material enters airway, passes BELOW cords  without attempt by patient to eject out (silent aspiration) Pharyngeal- Honey Cup -- Pharyngeal -- Pharyngeal- Nectar Teaspoon Delayed swallow initiation-pyriform sinuses;Penetration/Aspiration before swallow Pharyngeal Material enters airway, passes BELOW cords without attempt by patient to eject out (silent aspiration) Pharyngeal- Nectar Cup -- Pharyngeal -- Pharyngeal- Nectar Straw -- Pharyngeal -- Pharyngeal- Thin Teaspoon Delayed swallow initiation-pyriform sinuses;Penetration/Aspiration before swallow Pharyngeal Material enters airway, passes  BELOW cords without attempt by patient to eject out (silent aspiration) Pharyngeal- Thin Cup Delayed swallow initiation-pyriform sinuses Pharyngeal -- Pharyngeal- Thin Straw Delayed swallow initiation-pyriform sinuses Pharyngeal -- Pharyngeal- Puree Delayed swallow initiation-vallecula Pharyngeal -- Pharyngeal- Mechanical Soft -- Pharyngeal -- Pharyngeal- Regular -- Pharyngeal -- Pharyngeal- Multi-consistency Delayed swallow initiation-vallecula Pharyngeal -- Pharyngeal- Pill -- Pharyngeal -- Pharyngeal Comment --  CHL IP CERVICAL ESOPHAGEAL PHASE 09/06/2016 Cervical Esophageal Phase WFL Pudding Teaspoon -- Pudding Cup -- Honey Teaspoon -- Honey Cup -- Nectar Teaspoon -- Nectar Cup -- Nectar Straw -- Thin Teaspoon -- Thin Cup -- Thin Straw -- Puree -- Mechanical Soft -- Regular -- Multi-consistency -- Pill -- Cervical Esophageal Comment -- CHL IP GO 09/06/2016 Functional Assessment Tool Used (None) Functional Limitations Swallowing Swallow Current Status (H2094) (None) Swallow Goal Status (B0962) (None) Swallow Discharge Status (E3662) (None) Motor Speech Current Status (H4765) (None) Motor Speech Goal Status (Y6503) (None) Motor Speech Goal Status (T4656) (None) Spoken Language Comprehension Current Status (C1275) (None) Spoken Language Comprehension Goal Status (T7001) (None) Spoken Language Comprehension Discharge Status (V4944) (None) Spoken Language Expression Current  Status (H6759) (None) Spoken Language Expression Goal Status (F6384) (None) Spoken Language Expression Discharge Status [phone]) (None) Attention Current Status (J5701) (None) Attention Goal Status (X7939) (None) Attention Discharge Status (Q3009) (None) Memory Current Status (Q3300) (None) Memory Goal Status (T6226) (None) Memory Discharge Status (J3354) (None) Voice Current Status (T6256) (None) Voice Goal Status (L8937) (None) Voice Discharge Status (D4287) (None) Other Speech-Language Pathology Functional Limitation Current Status (G8115) (None) Other Speech-Language Pathology Functional Limitation Goal Status (B2620) (None) Other Speech-Language Pathology Functional Limitation Discharge Status [phone]) (None) Shelly Flatten, MA, CCC-SLP Acute Rehab SLP [phone] Lamar Sprinkles 09/06/2016, 11:21 AM               Assessment/Plan: Diagnosis: Facial/vestibularcochlear nerve mass extending into brachium pontis Labs and images independently reviewed.  Records reviewed and summated above.  1. Does the need for close, 24 hr/day medical supervision in concert with the patient's rehab needs make it unreasonable for this patient to be served in a less intensive setting? Potentially  2. Co-Morbidities requiring supervision/potential complications: CAD and MI (cont meds), hypokalemia (continue to monitor and replete as necessary), ABLA (transfuse if necessary to ensure appropriate perfusion for increased activity tolerance), post-op pain (Biofeedback training with therapies to help reduce reliance on opiate pain medications, monitor pain control during therapies, and sedation at rest and titrate to maximum efficacy to ensure participation and gains in therapies) 3. Due to safety, disease management, pain management and patient education, does the patient require 24 hr/day rehab nursing? Yes 4. Does the patient require coordinated care of a physician, rehab nurse, PT (1-2 hrs/day, 5 days/week) and OT (1-2 hrs/day, 5  days/week) to address physical and functional deficits in the context of the above medical diagnosis(es)? Yes Addressing deficits in the following areas: balance, endurance, locomotion, strength, transferring, bathing, dressing, toileting and psychosocial support 5. Can the patient actively participate in an intensive therapy program of at least 3 hrs of therapy per day at least 5 days per week? Yes 6. The potential for patient to make measurable gains while on inpatient rehab is excellent 7. Anticipated functional outcomes upon discharge from inpatient rehab are modified independent  with PT, modified independent with OT, n/a with SLP. 8. Estimated rehab length of stay to reach the above functional goals is: 7-10 days. 9. Anticipated D/C setting: Home 10. Anticipated post D/C treatments: HH therapy and Home excercise program 11. Overall Rehab/Functional Prognosis:  good  RECOMMENDATIONS: This patient's condition is appropriate for continued rehabilitative care in the following setting: CIR Patient has agreed to participate in recommended program. Yes Note that insurance prior authorization may be required for reimbursement for recommended care.  Comment: Rehab Admissions Coordinator to follow up.  Delice Lesch, MD, [place], [place] 09/07/2016

## 2016-09-07 NOTE — Progress Notes (Signed)
  Speech Language Pathology Treatment: Dysphagia  Patient Details Name: Kimberly Vaughn MRN: [id number] DOB: 1945/04/27 Today's Date: 09/07/2016 Time: 9169-4503 SLP Time Calculation (min) (ACUTE ONLY): 16 min  Assessment / Plan / Recommendation Clinical Impression  SLP provided skilled observation during breakfast meal. Pt verbalized her need for a chin tuck and utilized it during thin liquid intake with Mod I, although Min cues were provided for smaller sip size. One immediate throat clear was observed following a straw sip of water, but no further overt signs of aspiration were noted. She does have frequent eructation that is associated with liquids. Pt describes L sided pocketing and difficulty clearing the food from her mouth. She seems to manage this well, but would favor keeping her on her soft diet instead of advancing her to regular textures given her subjective complaints. Will continue to follow.   HPI HPI: Pt is a 71 y.o. female with rapidly enlarging left-sided CP angle lesion including enhancement of the seventh eighth nerve complex, and progressive hearing loss and facial droop. 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 PMH includes PNA, HTN, HH, GERD, DDD, CAD, asthma, a fib      SLP Plan  Continue with current plan of care       Recommendations  Diet recommendations: Dysphagia 3 (mechanical soft);Thin liquid Liquids provided via: Cup;Straw Medication Administration: Whole meds with puree Supervision: Patient able to self feed;Intermittent supervision to cue for compensatory strategies Compensations: Slow rate;Small sips/bites;Chin tuck;Use straw to facilitate chin tuck Postural Changes and/or Swallow Maneuvers: Seated upright 90 degrees;Upright 30-60 min after meal                Oral Care Recommendations: Oral care BID Follow up Recommendations: Inpatient  Rehab SLP Visit Diagnosis: Dysphagia, oropharyngeal phase (R13.12) Plan: Continue with current plan of care       GO                Germain Osgood 09/07/2016, 10:09 AM  Germain Osgood, M.A. CCC-SLP [phone]

## 2016-09-07 NOTE — Progress Notes (Signed)
No issues overnight. Per patient, is essentially unchanged from preop.  EXAM:  BP (!) 134/58 (BP Location: Left Arm)   Pulse 78   Temp 98.6 F (37 C) (Oral)   Resp 15   Ht 5\' 1"  (1.549 m)   Wt 84.2 kg (185 lb 10 oz)   SpO2 98%   BMI 35.07 kg/m   Awake, alert, oriented  Speech fluent, appropriate, dysarthric  Complete left facial droop, no hearing left, left facial numbness Mild LUE/LLE weakness Wound c/d/i  IMPRESSION:  71 y.o. female POD#3 s/p left retrosig craniectomy for subtotal resection of tumor. At neurologic baseline with complete CN VII, VIII and partial V palsies. Hypokalemia treated with K this am  PLAN: - Can transfer to floor with tele - Cont to mobilize with PT/OT - Await path

## 2016-09-07 NOTE — Evaluation (Signed)
Physical Therapy Evaluation Patient Details Name: Kimberly Vaughn MRN: [id number] DOB: [birth year] Today's Date: 09/07/2016   History of Present Illness  Pt is a 71 y.o. female with a history of rapid progression of L sided hearing loss and facial droop with repeat MRI's ultimately revealing enhancement of the vestibulocochlear nerve complex as well as the facial nerve primarily within the temporal bone and enhancing nodule measuring more than 2 cm at root entry zone in brainstem. Pt also with L sided facial numbness including all 3 distributions of the trigeminal nerve.  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 She has PMH significant for but not limited to  A-fib, coronary artery disease, DDD lumbar, GERD, myocardial infarction, nodule of parotid gland, and pulmonary nodule.   Clinical Impression  Pt admitted with above diagnosis. Pt currently with functional limitations due to the deficits listed below (see PT Problem List). At the time of PT eval, pt was able to perform transfers and ambulation with +2 assist for balance support, coordination with the RW, line management and general safety. PTA pt was attending Cardiac Rehab 3x/week and is very motivated to participate with therapy for functional recovery. Feel this patient is a great candidate for CIR. Pt will benefit from skilled PT to increase their independence and safety with mobility to allow discharge to the venue listed below.       Follow Up Recommendations CIR;Supervision/Assistance - 24 hour    Equipment Recommendations  Rolling walker with 5" wheels    Recommendations for Other Services Rehab consult     Precautions / Restrictions Precautions Precautions: Fall Precaution Comments: Does not hear out of L ear; L eye with blurred vision. Restrictions Weight Bearing Restrictions: No      Mobility  Bed Mobility Overal bed  mobility: Needs Assistance Bed Mobility: Supine to Sit     Supine to sit: Min assist     General bed mobility comments: Light assist provided for balance as pt anteriorly leaned and scooted forward to EOB. Increased time required. Pt with gagging upon achieving sit - reports she has been nauseated.   Transfers Overall transfer level: Needs assistance Equipment used: Rolling walker (2 wheeled) Transfers: Sit to/from Stand Sit to Stand: Min assist;+2 physical assistance         General transfer comment: Assist for power-up to full stand and to gain/maintain balance. VC's required for hand placement on seated surface for safety.   Ambulation/Gait Ambulation/Gait assistance: Min assist;+2 physical assistance;+2 safety/equipment Ambulation Distance (Feet): 15 Feet (7', 8') Assistive device: Rolling walker (2 wheeled) Gait Pattern/deviations: Step-through pattern;Decreased stride length;Trunk flexed;Antalgic Gait velocity: Decreased Gait velocity interpretation: Below normal speed for age/gender General Gait Details: +2 assist required for balance support and safety. Therapists supported patient at the gait belt as well as supported the walker. Pt ambulated to the bathroom to void, and then washed hands at sink and ambulated to the recliner chair.   Stairs            Wheelchair Mobility    Modified Rankin (Stroke Patients Only)       Balance Overall balance assessment: Needs assistance Sitting-balance support: Feet supported;No upper extremity supported Sitting balance-Leahy Scale: Fair     Standing balance support: Bilateral upper extremity supported;During functional activity;No upper extremity supported Standing balance-Leahy Scale: Poor Standing balance comment: B UE support and external assist required.  Pertinent Vitals/Pain Pain Assessment: Faces Faces Pain Scale: No hurt    Home Living Family/patient expects to be  discharged to:: Private residence Living Arrangements: Spouse/significant other Available Help at Discharge: Family;Available 24 hours/day Type of Home: Mobile home Home Access: Stairs to enter;Ramped entrance   Entrance Stairs-Number of Steps: 1 Home Layout: One level Home Equipment: Shower seat;Cane - single point;Grab bars - tub/shower;Grab bars - toilet;Hand held shower head (walking stick in addition to cane)      Prior Function Level of Independence: Independent with assistive device(s)         Comments: Used the walking stick and furniture cruised for support     Hand Dominance   Dominant Hand: Right    Extremity/Trunk Assessment   Upper Extremity Assessment Upper Extremity Assessment: Defer to OT evaluation LUE Deficits / Details: Strength WFL and pt reports sensation in-tact per pt report but will continue to formally assess. Decreased gross and fine motor coordination. Overshooting and undershooting noted with all reaching to target.  LUE Coordination: decreased fine motor;decreased gross motor    Lower Extremity Assessment Lower Extremity Assessment: LLE deficits/detail LLE Deficits / Details: Strength WFL however pt reports decreased sensation to light touch in LLE. Reports numbness but no tingling.  LLE Sensation: decreased light touch    Cervical / Trunk Assessment Cervical / Trunk Assessment: Other exceptions Cervical / Trunk Exceptions: Forward head/rounded shoulder posture  Communication   Communication: Other (comment) (L sided hearing deficit)  Cognition Arousal/Alertness: Awake/alert Behavior During Therapy: WFL for tasks assessed/performed Overall Cognitive Status: Within Functional Limits for tasks assessed                                        General Comments General comments (skin integrity, edema, etc.): BP 151/71 prior to activity    Exercises     Assessment/Plan    PT Assessment Patient needs continued PT services   PT Problem List Decreased activity tolerance;Decreased balance;Decreased mobility;Decreased coordination;Decreased knowledge of use of DME;Decreased safety awareness;Decreased knowledge of precautions;Impaired sensation       PT Treatment Interventions DME instruction;Gait training;Stair training;Functional mobility training;Therapeutic activities;Therapeutic exercise;Neuromuscular re-education;Patient/family education    PT Goals (Current goals can be found in the Care Plan section)  Acute Rehab PT Goals Patient Stated Goal: to get back to independence PT Goal Formulation: With patient Time For Goal Achievement: 09/21/16 Potential to Achieve Goals: Good    Frequency Min 4X/week   Barriers to discharge        Co-evaluation PT/OT/SLP Co-Evaluation/Treatment: Yes Reason for Co-Treatment: Complexity of the patient's impairments (multi-system involvement);To address functional/ADL transfers;For patient/therapist safety PT goals addressed during session: Mobility/safety with mobility;Balance;Proper use of DME OT goals addressed during session: ADL's and self-care       AM-PAC PT "6 Clicks" Daily Activity  Outcome Measure Difficulty turning over in bed (including adjusting bedclothes, sheets and blankets)?: A Little Difficulty moving from lying on back to sitting on the side of the bed? : A Lot Difficulty sitting down on and standing up from a chair with arms (e.g., wheelchair, bedside commode, etc,.)?: A Lot Help needed moving to and from a bed to chair (including a wheelchair)?: A Lot Help needed walking in hospital room?: A Lot Help needed climbing 3-5 steps with a railing? : Total 6 Click Score: 12    End of Session Equipment Utilized During Treatment: Gait belt Activity Tolerance: Patient tolerated  treatment well Patient left: in chair;with call bell/phone within reach;with chair alarm set Nurse Communication: Mobility status PT Visit Diagnosis: Muscle weakness  (generalized) (M62.81);Ataxic gait (R26.0);Other symptoms and signs involving the nervous system (R29.898)    Time: 0827-0905 PT Time Calculation (min) (ACUTE ONLY): 38 min   Charges:   PT Evaluation $PT Eval Moderate Complexity: 1 Procedure PT Treatments $Gait Training: 8-22 mins   PT G Codes:        Rolinda Roan, PT, DPT Acute Rehabilitation Services Pager: [phone]   Thelma Comp 09/07/2016, 12:42 PM

## 2016-09-07 NOTE — Plan of Care (Signed)
Problem: Tissue Perfusion: Goal: Risk factors for ineffective tissue perfusion will decrease Outcome: Progressing SCDs are on , no S/S of DVT noted

## 2016-09-07 NOTE — Evaluation (Signed)
Occupational Therapy Evaluation Patient Details Name: Kimberly Vaughn MRN: [id number] DOB: 1945-09-08 Today's Date: 09/07/2016    History of Present Illness Pt is a 71 y.o. female with a history of rapid progression of L sided hearing loss and facial droop with repeat MRI's ultimately revealing enhancement of the vestibulocochlear nerve complex as well as the facial nerve primarily within the temporal bone and enhancing nodule measuring more than 2 cm at root entry zone in brainstem. Pt also with L sided facial numbness including all 3 distributions of the trigeminal nerve.  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 She has PMH significant for but not limited to  A-fib, coronary artery disease, DDD lumbar, GERD, myocardial infarction, nodule of parotid gland, and pulmonary nodule.    Clinical Impression   PTA, pt was independent with walking stick for ADL and functional mobility. She currently requires mod assist +2 for toilet transfers, min assist for self-feeding tasks, and mod assist for overall dressing and bathing tasks. Pt presents with decreased L sided sensation and coordination and poor dynamic balance impacting her ability to participate in ADL at PLOF. She would benefit from continued OT services while admitted to improve independence with ADL and functional mobility. Feel pt would best benefit from CIR level therapies post-acute D/C in order to maximize return to independence prior to D/C home with husband. Will continue to follow acutely.    Follow Up Recommendations  CIR;Supervision/Assistance - 24 hour    Equipment Recommendations  Other (comment) (TBD)    Recommendations for Other Services Rehab consult     Precautions / Restrictions Precautions Precautions: Fall Restrictions Weight Bearing Restrictions: No      Mobility Bed Mobility Overal bed mobility: Needs  Assistance Bed Mobility: Supine to Sit     Supine to sit: Min assist     General bed mobility comments: Light assist provided for balance as pt anteriorly leaned and scooted forward to EOB. Increased time required. Pt with gagging upon achieving sit - reports she has been nauseated.   Transfers Overall transfer level: Needs assistance Equipment used: Rolling walker (2 wheeled) Transfers: Sit to/from Stand Sit to Stand: Min assist;+2 physical assistance         General transfer comment: Assist for power-up to full stand and to gain/maintain balance. VC's required for hand placement on seated surface for safety.     Balance Overall balance assessment: Needs assistance Sitting-balance support: Feet supported;No upper extremity supported Sitting balance-Leahy Scale: Fair     Standing balance support: Bilateral upper extremity supported;During functional activity;No upper extremity supported Standing balance-Leahy Scale: Poor Standing balance comment: B UE support and external assist required.                            ADL either performed or assessed with clinical judgement   ADL Overall ADL's : Needs assistance/impaired Eating/Feeding: Set up;Sitting;Minimal assistance   Grooming: Standing;Moderate assistance Grooming Details (indicate cue type and reason): Mod assist to maintain balance.  Upper Body Bathing: Moderate assistance;Sitting   Lower Body Bathing: Moderate assistance;Sit to/from stand   Upper Body Dressing : Moderate assistance;Sitting   Lower Body Dressing: Moderate assistance;Sit to/from stand   Toilet Transfer: Moderate assistance;+2 for safety/equipment;RW Toilet Transfer Details (indicate cue type and reason): VC's for safety and use of RW.  Toileting- Clothing Manipulation and Hygiene: Moderate assistance;Sit to/from stand Toileting - Water quality scientist Details (  indicate cue type and reason): Mod assist for safety and VC's for technique.       Functional mobility during ADLs: Moderate assistance;+2 for safety/equipment;Rolling walker General ADL Comments: Pt limited by decreased L sided coordination.      Vision Baseline Vision/History: No visual deficits Patient Visual Report: Blurring of vision (blurring to the L) Vision Assessment?: Yes Eye Alignment: Within Functional Limits Ocular Range of Motion: Restricted on the left (L eye restricted to the L) Alignment/Gaze Preference: Within Defined Limits Tracking/Visual Pursuits: Other (comment);Decreased smoothness of horizontal tracking (Horizontal nystagmus to L with horizontal tracking. ) Saccades: Additional eye shifts occurred during testing Convergence: Within functional limits Visual Fields: No apparent deficits Additional Comments: Unable to full close L eye. Reports blurry vision when looking to the L.      Perception     Praxis      Pertinent Vitals/Pain Pain Assessment: No/denies pain     Hand Dominance Right   Extremity/Trunk Assessment Upper Extremity Assessment Upper Extremity Assessment: LUE deficits/detail LUE Deficits / Details: Strength WFL and pt reports sensation in-tact per pt report but will continue to formally assess. Decreased gross and fine motor coordination. Overshooting and undershooting noted with all reaching to target.  LUE Coordination: decreased fine motor;decreased gross motor   Lower Extremity Assessment Lower Extremity Assessment: Defer to PT evaluation   Cervical / Trunk Assessment Cervical / Trunk Assessment: Other exceptions Cervical / Trunk Exceptions: Forward head/rounded shoulder posture   Communication Communication Communication: Other (comment) (L sided hearing deficit)   Cognition Arousal/Alertness: Awake/alert Behavior During Therapy: WFL for tasks assessed/performed Overall Cognitive Status: Within Functional Limits for tasks assessed                                     General Comments   BP 151/71 prior to activity    Exercises     Shoulder Instructions      Home Living Family/patient expects to be discharged to:: Private residence Living Arrangements: Spouse/significant other Available Help at Discharge: Family;Available 24 hours/day Type of Home: Mobile home Home Access: Stairs to enter;Ramped entrance Entrance Stairs-Number of Steps: 1   Home Layout: One level     Bathroom Shower/Tub: Occupational psychologist: Standard Bathroom Accessibility: Yes   Home Equipment: Shower seat;Cane - single point;Grab bars - tub/shower;Grab bars - toilet;Hand held shower head (walking stick in addition to cane)          Prior Functioning/Environment Level of Independence: Independent with assistive device(s)        Comments: Used the walking stick and furniture cruised for support        OT Problem List: Decreased activity tolerance;Impaired balance (sitting and/or standing);Impaired vision/perception;Decreased safety awareness;Decreased knowledge of use of DME or AE;Decreased knowledge of precautions;Pain;Impaired UE functional use;Decreased coordination      OT Treatment/Interventions: Self-care/ADL training;Therapeutic exercise;Energy conservation;DME and/or AE instruction;Therapeutic activities;Visual/perceptual remediation/compensation;Patient/family education;Balance training    OT Goals(Current goals can be found in the care plan section) Acute Rehab OT Goals Patient Stated Goal: to get back to independence OT Goal Formulation: With patient Time For Goal Achievement: 09/21/16 Potential to Achieve Goals: Good  OT Frequency: Min 3X/week   Barriers to D/C:            Co-evaluation PT/OT/SLP Co-Evaluation/Treatment: Yes Reason for Co-Treatment: Complexity of the patient's impairments (multi-system involvement);For patient/therapist safety   OT goals addressed during session: ADL's and self-care  AM-PAC PT "6 Clicks" Daily Activity      Outcome Measure Help from another person eating meals?: A Little Help from another person taking care of personal grooming?: A Lot Help from another person toileting, which includes using toliet, bedpan, or urinal?: A Lot Help from another person bathing (including washing, rinsing, drying)?: A Lot Help from another person to put on and taking off regular upper body clothing?: A Lot Help from another person to put on and taking off regular lower body clothing?: A Lot 6 Click Score: 13   End of Session Equipment Utilized During Treatment: Gait belt;Rolling walker Nurse Communication: Mobility status  Activity Tolerance: Patient tolerated treatment well Patient left: in chair;with call bell/phone within reach;with chair alarm set  OT Visit Diagnosis: Other abnormalities of gait and mobility (R26.89);Ataxia, unspecified (R27.0);Hemiplegia and hemiparesis Hemiplegia - Right/Left: Left Hemiplegia - dominant/non-dominant: Non-Dominant                Time: 3361-2244 OT Time Calculation (min): 36 min Charges:  OT General Charges $OT Visit: 1 Procedure OT Evaluation $OT Eval Moderate Complexity: 1 Procedure G-Codes:     Norman Herrlich, MS OTR/L  Pager: [place] A Javion Holmer 09/07/2016, 9:27 AM

## 2016-09-08 ENCOUNTER — Ambulatory Visit: Payer: Medicare Other | Admitting: Neurology

## 2016-09-08 LAB — BASIC METABOLIC PANEL
Anion gap: 9 (ref 5–15)
BUN: 15 mg/dL (ref 6–20)
CALCIUM: 8.6 mg/dL — AB (ref 8.9–10.3)
CO2: 28 mmol/L (ref 22–32)
CREATININE: 0.54 mg/dL (ref 0.44–1.00)
Chloride: 102 mmol/L (ref 101–111)
GFR calc Af Amer: >60 mL/min (ref >60)
Glucose, Bld: 134 mg/dL — ABNORMAL HIGH (ref 65–99)
POTASSIUM: 3.1 mmol/L — AB (ref 3.5–5.1)
SODIUM: 139 mmol/L (ref 135–145)

## 2016-09-08 LAB — MAGNESIUM: MAGNESIUM: 2 mg/dL (ref 1.7–2.4)

## 2016-09-08 NOTE — Care Management Important Message (Signed)
Important Message  Patient Details  Name: Kimberly Vaughn MRN: [id number] Date of Birth: 09/12/1945   Medicare Important Message Given:  Yes    Orbie Pyo 09/08/2016, 1:59 PM

## 2016-09-08 NOTE — Progress Notes (Signed)
Inpatient Rehabilitation  Met with patient to discuss team's recommendation for IP Rehab.  I shared booklets and answered.  Patient also requested that I call her daughter Jeani Hawking with an update, which I did.  Jeani Hawking with be up later today to go over the booklets with her mom.  I have initiated insurance authorization and plan to follow up with the team when I have a decision.  Please call with questions.   Carmelia Roller., CCC/SLP Admission Coordinator  [facility]  Cell [phone]

## 2016-09-08 NOTE — Progress Notes (Addendum)
No issues overnight. No current complaints.  EXAM:  BP (!) 155/71 (BP Location: Left Arm)   Pulse 79   Temp 98.1 F (36.7 C) (Oral)   Resp 17   Ht 5\' 1"  (1.549 m)   Wt 84.2 kg (185 lb 10 oz)   SpO2 97%   BMI 35.07 kg/m   Awake, alert, oriented  Speech fluent, appropriate  Left facial droop, left facial numbness, no hearing on left 5/5 BUE/BLE  Left dysmetria  IMPRESSION:  71 y.o. female s/p subtotal resection left brainstem lesion. Doing well, essentially at baseline.  PLAN: - CIR tomorrow - Awaiting pathology, flow cytometry with kappa-restricted B-cells suggesting plasma cell neoplasm. Will cont dex for now. - Will restart Brilinta on Friday

## 2016-09-08 NOTE — Progress Notes (Signed)
Physical Therapy Treatment Patient Details Name: Kimberly Vaughn MRN: [id number] DOB: [birth year] Today's Date: 09/08/2016    History of Present Illness Pt is a 71 y.o. female with a history of rapid progression of L sided hearing loss and facial droop with repeat MRI's ultimately revealing enhancement of the vestibulocochlear nerve complex as well as the facial nerve primarily within the temporal bone and enhancing nodule measuring more than 2 cm at root entry zone in brainstem. Pt also with L sided facial numbness including all 3 distributions of the trigeminal nerve.  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 She has PMH significant for but not limited to  A-fib, coronary artery disease, DDD lumbar, GERD, myocardial infarction, nodule of parotid gland, and pulmonary nodule.     PT Comments    Patient very eager to mobilize and work on gait retraining today. Performed 2 bouts of 30 ft with RW. Gait retraining focusing on LLE control and placement as well as posture and positioning for safety. Patient continues to require physical assist for gait and mobility, increased moderate assist to prevent fall during turns and directional changes. CIR remains appropriate to progress functional recovery.   Follow Up Recommendations  CIR;Supervision/Assistance - 24 hour     Equipment Recommendations  Rolling walker with 5" wheels    Recommendations for Other Services Rehab consult     Precautions / Restrictions Precautions Precautions: Fall Precaution Comments: Does not hear out of L ear; L eye with blurred vision. Restrictions Weight Bearing Restrictions: No    Mobility  Bed Mobility Overal bed mobility: Needs Assistance Bed Mobility: Sit to Supine       Sit to supine: Min assist   General bed mobility comments: Min assist to elevate LEs and reposition back to  bed  Transfers Overall transfer level: Needs assistance Equipment used: Rolling walker (2 wheeled) Transfers: Sit to/from Stand Sit to Stand: Min assist         General transfer comment: Min assist for stability during power up. VCs for hand placement and positioning  Ambulation/Gait Ambulation/Gait assistance: Min assist;+2 physical assistance Ambulation Distance (Feet): 30 Feet Assistive device: Rolling walker (2 wheeled) Gait Pattern/deviations: Step-through pattern;Decreased stride length;Trunk flexed;Ataxic Gait velocity: Decreased Gait velocity interpretation: Below normal speed for age/gender General Gait Details: Decreased coordination of LLE, VCs for slow controlled cadence with emphasis on quadsetting and placement of LLE. Tactile cues for weight shift and posture. Heavy reliance on RW for UE support.   Stairs            Wheelchair Mobility    Modified Rankin (Stroke Patients Only)       Balance Overall balance assessment: Needs assistance Sitting-balance support: Feet supported;No upper extremity supported Sitting balance-Leahy Scale: Fair     Standing balance support: Bilateral upper extremity supported;During functional activity;No upper extremity supported Standing balance-Leahy Scale: Poor Standing balance comment: B UE support and external assist required.              High level balance activites: Direction changes;Turns High Level Balance Comments: moderate assist due to ataxic LLE, max multi modal cues to facilitate steps for turns and directional change            Cognition Arousal/Alertness: Awake/alert Behavior During Therapy: WFL for tasks assessed/performed Overall Cognitive Status: Within Functional Limits for tasks assessed  Exercises      General Comments        Pertinent Vitals/Pain Faces Pain Scale: No hurt    Home Living                      Prior  Function            PT Goals (current goals can now be found in the care plan section) Acute Rehab PT Goals Patient Stated Goal: to get back to independence PT Goal Formulation: With patient Time For Goal Achievement: 09/21/16 Potential to Achieve Goals: Good Progress towards PT goals: Progressing toward goals    Frequency    Min 4X/week      PT Plan Current plan remains appropriate    Co-evaluation              AM-PAC PT "6 Clicks" Daily Activity  Outcome Measure  Difficulty turning over in bed (including adjusting bedclothes, sheets and blankets)?: Total Difficulty moving from lying on back to sitting on the side of the bed? : A Lot Difficulty sitting down on and standing up from a chair with arms (e.g., wheelchair, bedside commode, etc,.)?: A Lot Help needed moving to and from a bed to chair (including a wheelchair)?: A Lot Help needed walking in hospital room?: A Lot Help needed climbing 3-5 steps with a railing? : Total 6 Click Score: 10    End of Session Equipment Utilized During Treatment: Gait belt Activity Tolerance: Patient tolerated treatment well Patient left: in bed;with call bell/phone within reach;with bed alarm set;with family/visitor present Nurse Communication: Mobility status PT Visit Diagnosis: Muscle weakness (generalized) (M62.81);Ataxic gait (R26.0);Other symptoms and signs involving the nervous system (V77.939)     Time: 0300-9233 PT Time Calculation (min) (ACUTE ONLY): 21 min  Charges:  $Gait Training: 8-22 mins                    G Codes:       Alben Deeds, PT DPT N[facility] [phone]    Duncan Dull 09/08/2016, 3:31 PM

## 2016-09-08 NOTE — Progress Notes (Signed)
Inpatient Rehabilitation  I have received [facility] Medicare approval for IP Rehab admission tomorrow, Wednesday 09/08/16. I met with patient to update her and she reports that she discussed it with her daughter and they are in favor of IP Rehab to regain her independence prior to discharge home.  Plan to follow up with the team tomorrow to ensure medical readiness prior to admission.  Please call with questions.   Carmelia Roller., CCC/SLP Admission Coordinator  [place]  Cell [phone]

## 2016-09-09 ENCOUNTER — Inpatient Hospital Stay (HOSPITAL_COMMUNITY)
Admission: RE | Admit: 2016-09-09 | Discharge: 2016-09-22 | DRG: 092 | Disposition: A | Discharge status: Home Health | Payer: Medicare Other | Source: Intra-hospital | Attending: Physical Medicine & Rehabilitation | Admitting: Physical Medicine & Rehabilitation

## 2016-09-09 ENCOUNTER — Encounter (HOSPITAL_COMMUNITY): Payer: Self-pay

## 2016-09-09 DIAGNOSIS — Z90710 Acquired absence of both cervix and uterus: Secondary | ICD-10-CM | POA: Diagnosis not present

## 2016-09-09 DIAGNOSIS — K219 Gastro-esophageal reflux disease without esophagitis: Secondary | ICD-10-CM | POA: Diagnosis present

## 2016-09-09 DIAGNOSIS — Z6832 Body mass index (BMI) 32.0-32.9, adult: Secondary | ICD-10-CM

## 2016-09-09 DIAGNOSIS — E46 Unspecified protein-calorie malnutrition: Secondary | ICD-10-CM | POA: Diagnosis present

## 2016-09-09 DIAGNOSIS — G839 Paralytic syndrome, unspecified: Secondary | ICD-10-CM | POA: Diagnosis present

## 2016-09-09 DIAGNOSIS — T380X5D Adverse effect of glucocorticoids and synthetic analogues, subsequent encounter: Secondary | ICD-10-CM | POA: Diagnosis not present

## 2016-09-09 DIAGNOSIS — I480 Paroxysmal atrial fibrillation: Secondary | ICD-10-CM | POA: Diagnosis present

## 2016-09-09 DIAGNOSIS — D72829 Elevated white blood cell count, unspecified: Secondary | ICD-10-CM | POA: Diagnosis not present

## 2016-09-09 DIAGNOSIS — Z7982 Long term (current) use of aspirin: Secondary | ICD-10-CM

## 2016-09-09 DIAGNOSIS — R197 Diarrhea, unspecified: Secondary | ICD-10-CM | POA: Diagnosis present

## 2016-09-09 DIAGNOSIS — Z955 Presence of coronary angioplasty implant and graft: Secondary | ICD-10-CM | POA: Diagnosis not present

## 2016-09-09 DIAGNOSIS — R739 Hyperglycemia, unspecified: Secondary | ICD-10-CM

## 2016-09-09 DIAGNOSIS — R0989 Other specified symptoms and signs involving the circulatory and respiratory systems: Secondary | ICD-10-CM | POA: Diagnosis not present

## 2016-09-09 DIAGNOSIS — R29810 Facial weakness: Secondary | ICD-10-CM | POA: Diagnosis present

## 2016-09-09 DIAGNOSIS — R1310 Dysphagia, unspecified: Secondary | ICD-10-CM | POA: Diagnosis present

## 2016-09-09 DIAGNOSIS — I10 Essential (primary) hypertension: Secondary | ICD-10-CM | POA: Diagnosis present

## 2016-09-09 DIAGNOSIS — T380X5A Adverse effect of glucocorticoids and synthetic analogues, initial encounter: Secondary | ICD-10-CM | POA: Diagnosis present

## 2016-09-09 DIAGNOSIS — G939 Disorder of brain, unspecified: Secondary | ICD-10-CM | POA: Diagnosis not present

## 2016-09-09 DIAGNOSIS — I2510 Atherosclerotic heart disease of native coronary artery without angina pectoris: Secondary | ICD-10-CM | POA: Diagnosis present

## 2016-09-09 DIAGNOSIS — C8339 Diffuse large B-cell lymphoma, extranodal and solid organ sites: Secondary | ICD-10-CM | POA: Diagnosis present

## 2016-09-09 DIAGNOSIS — IMO0001 Reserved for inherently not codable concepts without codable children: Secondary | ICD-10-CM

## 2016-09-09 DIAGNOSIS — E876 Hypokalemia: Secondary | ICD-10-CM | POA: Diagnosis present

## 2016-09-09 DIAGNOSIS — D496 Neoplasm of unspecified behavior of brain: Secondary | ICD-10-CM

## 2016-09-09 DIAGNOSIS — I252 Old myocardial infarction: Secondary | ICD-10-CM | POA: Diagnosis not present

## 2016-09-09 DIAGNOSIS — H547 Unspecified visual loss: Secondary | ICD-10-CM | POA: Diagnosis present

## 2016-09-09 DIAGNOSIS — I951 Orthostatic hypotension: Secondary | ICD-10-CM | POA: Diagnosis present

## 2016-09-09 DIAGNOSIS — H9192 Unspecified hearing loss, left ear: Secondary | ICD-10-CM | POA: Diagnosis present

## 2016-09-09 DIAGNOSIS — E8809 Other disorders of plasma-protein metabolism, not elsewhere classified: Secondary | ICD-10-CM

## 2016-09-09 DIAGNOSIS — C8589 Other specified types of non-Hodgkin lymphoma, extranodal and solid organ sites: Secondary | ICD-10-CM

## 2016-09-09 DIAGNOSIS — Z79899 Other long term (current) drug therapy: Secondary | ICD-10-CM | POA: Diagnosis not present

## 2016-09-09 DIAGNOSIS — R1110 Vomiting, unspecified: Secondary | ICD-10-CM

## 2016-09-09 DIAGNOSIS — I491 Atrial premature depolarization: Secondary | ICD-10-CM | POA: Diagnosis present

## 2016-09-09 DIAGNOSIS — Z87891 Personal history of nicotine dependence: Secondary | ICD-10-CM | POA: Diagnosis not present

## 2016-09-09 DIAGNOSIS — Z9889 Other specified postprocedural states: Secondary | ICD-10-CM

## 2016-09-09 DIAGNOSIS — R270 Ataxia, unspecified: Principal | ICD-10-CM | POA: Diagnosis present

## 2016-09-09 DIAGNOSIS — D72828 Other elevated white blood cell count: Secondary | ICD-10-CM | POA: Diagnosis not present

## 2016-09-09 DIAGNOSIS — G9389 Other specified disorders of brain: Secondary | ICD-10-CM

## 2016-09-09 DIAGNOSIS — R1312 Dysphagia, oropharyngeal phase: Secondary | ICD-10-CM | POA: Diagnosis not present

## 2016-09-09 DIAGNOSIS — D62 Acute posthemorrhagic anemia: Secondary | ICD-10-CM | POA: Diagnosis not present

## 2016-09-09 HISTORY — DX: Other specified disorders of brain: G93.89

## 2016-09-09 LAB — URINALYSIS, ROUTINE W REFLEX MICROSCOPIC
BILIRUBIN URINE: NEGATIVE
GLUCOSE, UA: NEGATIVE mg/dL
KETONES UR: NEGATIVE mg/dL
LEUKOCYTES UA: NEGATIVE
Nitrite: NEGATIVE
PH: 7 (ref 5.0–8.0)
Protein, ur: NEGATIVE mg/dL
Specific Gravity, Urine: 1.009 (ref 1.005–1.030)

## 2016-09-09 MED ORDER — PROCHLORPERAZINE EDISYLATE 5 MG/ML IJ SOLN: 5.0000 mg | Freq: Four times a day (QID) | INTRAMUSCULAR | Status: DC | PRN

## 2016-09-09 MED ORDER — ASPIRIN EC 81 MG PO TBEC
81.0000 mg | DELAYED_RELEASE_TABLET | Freq: Every day | ORAL | Status: DC
  Administered 2016-09-10 – 2016-09-22 (×13): 81 mg via ORAL
  Filled 2016-09-09 (×13): qty 1

## 2016-09-09 MED ORDER — PANTOPRAZOLE SODIUM 40 MG PO TBEC
80.0000 mg | DELAYED_RELEASE_TABLET | Freq: Every day | ORAL | Status: DC
  Administered 2016-09-10 – 2016-09-22 (×13): 80 mg via ORAL
  Filled 2016-09-09 (×13): qty 2

## 2016-09-09 MED ORDER — METOPROLOL TARTRATE 25 MG PO TABS
25.0000 mg | ORAL_TABLET | Freq: Two times a day (BID) | ORAL | Status: DC
  Administered 2016-09-10 – 2016-09-22 (×25): 25 mg via ORAL
  Filled 2016-09-09 (×24): qty 1

## 2016-09-09 MED ORDER — ENSURE ENLIVE PO LIQD
237.0000 mL | Freq: Two times a day (BID) | ORAL | Status: DC
  Administered 2016-09-10 – 2016-09-14 (×5): 237 mL via ORAL

## 2016-09-09 MED ORDER — PROCHLORPERAZINE MALEATE 5 MG PO TABS: 5.0000 mg | ORAL_TABLET | Freq: Four times a day (QID) | ORAL | Status: DC | PRN

## 2016-09-09 MED ORDER — MECLIZINE HCL 25 MG PO TABS: 25.0000 mg | ORAL_TABLET | Freq: Three times a day (TID) | ORAL | Status: DC | PRN

## 2016-09-09 MED ORDER — VITAMIN D 1000 UNITS PO TABS
2000.0000 [IU] | ORAL_TABLET | Freq: Every day | ORAL | Status: DC
  Administered 2016-09-10 – 2016-09-22 (×13): 2000 [IU] via ORAL
  Filled 2016-09-09 (×13): qty 2

## 2016-09-09 MED ORDER — VITAMIN B-12 1000 MCG PO TABS
5000.0000 ug | ORAL_TABLET | Freq: Every day | ORAL | Status: DC
  Administered 2016-09-10 – 2016-09-22 (×13): 5000 ug via ORAL
  Filled 2016-09-09 (×13): qty 5

## 2016-09-09 MED ORDER — HYDROCODONE-ACETAMINOPHEN 5-325 MG PO TABS: 1.0000 | ORAL_TABLET | ORAL | 0 refills | Status: DC | PRN

## 2016-09-09 MED ORDER — TRAZODONE HCL 50 MG PO TABS: 25.0000 mg | ORAL_TABLET | Freq: Every evening | ORAL | Status: DC | PRN

## 2016-09-09 MED ORDER — NITROGLYCERIN 0.4 MG SL SUBL: 0.4000 mg | SUBLINGUAL_TABLET | SUBLINGUAL | Status: DC | PRN

## 2016-09-09 MED ORDER — HYDROCODONE-ACETAMINOPHEN 5-325 MG PO TABS: 1.0000 | ORAL_TABLET | ORAL | Status: DC | PRN

## 2016-09-09 MED ORDER — DIPHENHYDRAMINE HCL 12.5 MG/5ML PO ELIX
12.5000 mg | ORAL_SOLUTION | Freq: Four times a day (QID) | ORAL | Status: DC | PRN
Start: 2016-09-09 — End: 2016-09-22

## 2016-09-09 MED ORDER — POTASSIUM CHLORIDE CRYS ER 20 MEQ PO TBCR
20.0000 meq | EXTENDED_RELEASE_TABLET | Freq: Two times a day (BID) | ORAL | Status: DC
  Administered 2016-09-11 – 2016-09-22 (×22): 20 meq via ORAL
  Filled 2016-09-09 (×22): qty 1

## 2016-09-09 MED ORDER — ACETAMINOPHEN 325 MG PO TABS: 325.0000 mg | ORAL_TABLET | ORAL | Status: DC | PRN

## 2016-09-09 MED ORDER — BRILINTA 90 MG PO TABS: 90.0000 mg | ORAL_TABLET | Freq: Two times a day (BID) | ORAL | 6 refills | Status: DC

## 2016-09-09 MED ORDER — HYDROCHLOROTHIAZIDE 25 MG PO TABS
12.5000 mg | ORAL_TABLET | Freq: Every morning | ORAL | Status: DC
  Administered 2016-09-10 – 2016-09-21 (×12): 12.5 mg via ORAL
  Filled 2016-09-09 (×14): qty 1

## 2016-09-09 MED ORDER — NAPHAZOLINE-GLYCERIN 0.012-0.2 % OP SOLN
1.0000 [drp] | Freq: Three times a day (TID) | OPHTHALMIC | Status: DC
  Administered 2016-09-09 – 2016-09-12 (×12): 2 [drp] via OPHTHALMIC
  Administered 2016-09-13: 1 [drp] via OPHTHALMIC
  Administered 2016-09-13 (×3): 2 [drp] via OPHTHALMIC
  Administered 2016-09-14: 1 [drp] via OPHTHALMIC
  Administered 2016-09-14 (×2): 2 [drp] via OPHTHALMIC
  Administered 2016-09-14 – 2016-09-15 (×3): 1 [drp] via OPHTHALMIC
  Administered 2016-09-15: 2 [drp] via OPHTHALMIC
  Administered 2016-09-15: 1 [drp] via OPHTHALMIC
  Administered 2016-09-16 (×4): 2 [drp] via OPHTHALMIC
  Administered 2016-09-17 (×2): 1 [drp] via OPHTHALMIC
  Administered 2016-09-17: 2 [drp] via OPHTHALMIC
  Administered 2016-09-17: 1 [drp] via OPHTHALMIC
  Administered 2016-09-18 – 2016-09-19 (×6): 2 [drp] via OPHTHALMIC
  Administered 2016-09-19: 1 [drp] via OPHTHALMIC
  Administered 2016-09-19 – 2016-09-21 (×8): 2 [drp] via OPHTHALMIC
  Administered 2016-09-21: 1 [drp] via OPHTHALMIC
  Administered 2016-09-22: 2 [drp] via OPHTHALMIC
  Filled 2016-09-09 (×3): qty 15

## 2016-09-09 MED ORDER — ARTIFICIAL TEARS OPHTHALMIC OINT
TOPICAL_OINTMENT | Freq: Every day | OPHTHALMIC | Status: DC
  Administered 2016-09-09: 23:00:00 via OPHTHALMIC
  Administered 2016-09-10: 1 via OPHTHALMIC
  Administered 2016-09-11 – 2016-09-17 (×6): via OPHTHALMIC
  Administered 2016-09-18: 1 via OPHTHALMIC
  Administered 2016-09-19 – 2016-09-20 (×2): via OPHTHALMIC
  Administered 2016-09-21: 1 via OPHTHALMIC
  Filled 2016-09-09: qty 3.5

## 2016-09-09 MED ORDER — FLEET ENEMA 7-19 GM/118ML RE ENEM: 1.0000 | ENEMA | Freq: Once | RECTAL | Status: DC | PRN

## 2016-09-09 MED ORDER — POLYETHYLENE GLYCOL 3350 17 G PO PACK
17.0000 g | PACK | Freq: Every day | ORAL | Status: DC | PRN
  Filled 2016-09-09: qty 1

## 2016-09-09 MED ORDER — GUAIFENESIN-DM 100-10 MG/5ML PO SYRP: 5.0000 mL | ORAL_SOLUTION | Freq: Four times a day (QID) | ORAL | Status: DC | PRN

## 2016-09-09 MED ORDER — ALUM & MAG HYDROXIDE-SIMETH 200-200-20 MG/5ML PO SUSP: 30.0000 mL | ORAL | Status: DC | PRN

## 2016-09-09 MED ORDER — BISACODYL 10 MG RE SUPP: 10.0000 mg | Freq: Every day | RECTAL | Status: DC | PRN

## 2016-09-09 MED ORDER — ATORVASTATIN CALCIUM 80 MG PO TABS
80.0000 mg | ORAL_TABLET | Freq: Every day | ORAL | Status: DC
  Administered 2016-09-10 – 2016-09-21 (×12): 80 mg via ORAL
  Filled 2016-09-09 (×6): qty 1
  Filled 2016-09-09: qty 2
  Filled 2016-09-09 (×5): qty 1

## 2016-09-09 MED ORDER — POTASSIUM CHLORIDE CRYS ER 20 MEQ PO TBCR
20.0000 meq | EXTENDED_RELEASE_TABLET | Freq: Three times a day (TID) | ORAL | Status: AC
  Administered 2016-09-09 – 2016-09-11 (×6): 20 meq via ORAL
  Filled 2016-09-09 (×6): qty 1

## 2016-09-09 MED ORDER — PROCHLORPERAZINE 25 MG RE SUPP: 12.5000 mg | Freq: Four times a day (QID) | RECTAL | Status: DC | PRN

## 2016-09-09 MED ORDER — DEXAMETHASONE SODIUM PHOSPHATE 4 MG/ML IJ SOLN
4.0000 mg | Freq: Three times a day (TID) | INTRAMUSCULAR | Status: DC
  Administered 2016-09-09 – 2016-09-10 (×2): 4 mg via INTRAVENOUS
  Filled 2016-09-09 (×2): qty 1

## 2016-09-09 NOTE — Progress Notes (Signed)
Inpatient Rehabilitation  I have a bed available to offer patient today and have received medical clearance.  Patient and family in agreement with plan.  Will proceed with IP Rehab admission today.  Please call with questions.   Carmelia Roller., CCC/SLP Admission Coordinator  [place]  Cell [phone]

## 2016-09-09 NOTE — Op Note (Signed)
PREOP DIAGNOSIS:  1. Left brainstem tumor   POSTOP DIAGNOSIS: Same  PROCEDURE: 1. Stereotactic left retrosigmoid craniectomy, resection of tumor 2. Use of intraoperative microscope for microdissection 3. Intraoperative electrophysiologic monitoring  SURGEON: Dr. Consuella Lose, MD  ASSISTANT: Dr. Sherley Bounds, MD  ANESTHESIA: General Endotracheal  EBL: 300cc  SPECIMENS: Brainstem tumor for frozen/permanent pathology  DRAINS: None  COMPLICATIONS: None immediate  CONDITION: Stable to PACU  HISTORY: Kimberly Vaughn is a 71 y.o. female Initially presenting to the Outpatient Neurosurgery Clinic with rapidly progressive left-sided facial droop and hearing loss.  Serial MRI scans demonstrated rapid enlargement of a left middle cerebellar peduncle enhancing lesion, concerning for malignancy.  She therefore elected to proceed with surgical resection of the lesion.  The risks and benefits of the surgery were explained in detail to the patient and her daughter.  After all questions were answered, informed consent was obtained and witnessed.  PROCEDURE IN DETAIL: After informed consent was obtained and witnessed, the patient was brought to the operating room. After induction of general anesthesia, the patient was positioned on the operative table in the [place] head holder in the right lateral decubitus position. All pressure points were meticulously padded.  Service markers were then code registered with the preoperative stereotactic CT scan which was fused with the MRI scan until a satisfactory accuracy was achieved.Skin incision was then marked out and prepped and draped in the usual sterile fashion.    After time-out was conducted, the retroauricular skin incision was made sharply after it was infiltrated with local anesthetic with epinephrine.  Incision was carried through subcutaneous tissue.  The Bovie electrocautery was used to divide the suboccipital musculature until the mastoid bone  was identified.  Subperiosteal dissection was then carried out until the back of the mastoid was identified.  Self-retaining retractors were then placed.  The stereotactic system was used to identify the projection of the transverse and sigmoid sinuses.  Bur hole was then created at the transverse sigmoid junction.  Craniectomy was then fashioned with a high-speed drill.  Further drilling of the mastoid bone identify the transverse and sigmoid sinuses.  Hemostasis was secured over the sinuses with Gel-Foam.  At this point, the dura was opened in cruciate fashion, and dural leaflets were tacked up with 4 Nurolon stitches.  The microscope was then draped sterilely and brought into the field, and the remainder of the case was done under the microscope using microdissection.    Initially, attention was turned inferiorly to the foramen magnum where arachnoid was incised, and good CSF release was obtained for brain relaxation.  The lower cranial nerves including numbers 9, 10, and 11 were identified coursing into the jugular foramen.  Attention was then turned more superiorly where the 7th 8th nerve complex was identified.  The entire nerve complex appeared to be infiltrated with tumor.  Tumor was in fact seen emanating from the root entry zone within the brainstem also.  At this point, biopsies were taken at the root entry zone and sent for frozen pathology.  This did confirm high-grade malignancy, although of indeterminate origin.  At this point, using micro dissectors, clearly abnormal tissue was dissected away from the brainstem.  There did not appear to be any clear plane between the underlying white matter and the tumor.  Without a clear plane between tumor and the brainstem, I did not feel it prudent to continue resecting tumor with the risk of causing further neurologic deficit at the brainstem.    At  this point, hemostasis was achieved in the resection cavity with morselized Gelfoam with thrombin.  The wound  was then irrigated with copious amounts of normal saline irrigation.  The dural leaflets were then reapproximated with a dura matrix inlay graft.  Good hemostasis over the sinuses was achieved with Gelfoam.  The dural opening was then covered with a layer of polyethylene glycol sealant.  Mesh titanium cranioplasty was then placed over the craniectomy defect and secured with screws.  The wound was then closed in multiple layers using a combination of 0 and 3 0 Vicryl stitches.  Skin was closed in the subcuticular layer with interrupted 3 0 Vicryl stitches, and a layer of Dermabond was applied.  After the Dermabond dried completely, head wrap was placed.    At the end of the case all sponge, needle, instrument, and cottonoid counts were correct.  The patient was then transferred to the stretcher, extubated, and taken to the postanesthesia care unit in stable hemodynamic condition.

## 2016-09-09 NOTE — Progress Notes (Signed)
Physical Therapy Treatment Patient Details Name: Kimberly Vaughn MRN: [id number] DOB: [birth year] Today's Date: 09/09/2016    History of Present Illness Pt is a 71 y.o. female with a history of rapid progression of L sided hearing loss and facial droop with repeat MRI's ultimately revealing enhancement of the vestibulocochlear nerve complex as well as the facial nerve primarily within the temporal bone and enhancing nodule measuring more than 2 cm at root entry zone in brainstem. Pt also with L sided facial numbness including all 3 distributions of the trigeminal nerve.  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 She has PMH significant for but not limited to  A-fib, coronary artery disease, DDD lumbar, GERD, myocardial infarction, nodule of parotid gland, and pulmonary nodule.     PT Comments    Pt seen for mobility progression. She tolerated ambulating an increased distance this session (two bouts of 50' with sitting rest break in between). Pt is very motivated to move and excited about transferring to CIR later today. PT will continue to follow acutely.    Follow Up Recommendations  CIR;Supervision/Assistance - 24 hour     Equipment Recommendations  Rolling walker with 5" wheels    Recommendations for Other Services       Precautions / Restrictions Precautions Precautions: Fall Precaution Comments: Does not hear out of L ear; L eye with blurred vision. (but improving) Restrictions Weight Bearing Restrictions: No    Mobility  Bed Mobility Overal bed mobility: Needs Assistance Bed Mobility: Sit to Supine       Sit to supine: Min guard   General bed mobility comments: pt OOB in recliner chair upon arrival  Transfers Overall transfer level: Needs assistance Equipment used: Rolling walker (2 wheeled) Transfers: Sit to/from Stand Sit to Stand: Min guard          General transfer comment: increased time and effort, vc'ing for bilateral hand placement, min guard for safety and stability with transition into standing from chair. pt performed sit<>stand x2 from chair  Ambulation/Gait Ambulation/Gait assistance: Min assist Ambulation Distance (Feet): 50 Feet (50' x2 with sitting rest break in between) Assistive device: Rolling walker (2 wheeled) Gait Pattern/deviations: Step-through pattern;Decreased stride length;Trunk flexed;Ataxic Gait velocity: Decreased Gait velocity interpretation: Below normal speed for age/gender General Gait Details: pt with poor coordination of L LE, cueing for quad activation during stance phase on L. pt maintained visual gaze downward as she stated that when she looks forward she gets very dizzy.   Stairs            Wheelchair Mobility    Modified Rankin (Stroke Patients Only)       Balance Overall balance assessment: Needs assistance Sitting-balance support: Feet supported;No upper extremity supported Sitting balance-Leahy Scale: Fair     Standing balance support: Bilateral upper extremity supported;During functional activity Standing balance-Leahy Scale: Poor Standing balance comment: pt reliant on bilateral UEs on RW               High Level Balance Comments: min assist due to ataxic LLE during stand pivot            Cognition Arousal/Alertness: Awake/alert Behavior During Therapy: WFL for tasks assessed/performed Overall Cognitive Status: Within Functional Limits for tasks assessed  Exercises      General Comments General comments (skin integrity, edema, etc.): Pt very excited about going to CIR today      Pertinent Vitals/Pain Pain Assessment: No/denies pain Faces Pain Scale: No hurt    Home Living                      Prior Function            PT Goals (current goals can now be found in the care plan section)  Acute Rehab PT Goals Patient Stated Goal: to get back to independence so she can travel PT Goal Formulation: With patient Time For Goal Achievement: 09/21/16 Potential to Achieve Goals: Good Progress towards PT goals: Progressing toward goals    Frequency    Min 4X/week      PT Plan Current plan remains appropriate    Co-evaluation              AM-PAC PT "6 Clicks" Daily Activity  Outcome Measure  Difficulty turning over in bed (including adjusting bedclothes, sheets and blankets)?: A Little Difficulty moving from lying on back to sitting on the side of the bed? : Total Difficulty sitting down on and standing up from a chair with arms (e.g., wheelchair, bedside commode, etc,.)?: Total Help needed moving to and from a bed to chair (including a wheelchair)?: A Little Help needed walking in hospital room?: A Little Help needed climbing 3-5 steps with a railing? : A Lot 6 Click Score: 13    End of Session Equipment Utilized During Treatment: Gait belt Activity Tolerance: Patient tolerated treatment well Patient left: in chair;with call bell/phone within reach Nurse Communication: Mobility status PT Visit Diagnosis: Muscle weakness (generalized) (M62.81);Ataxic gait (R26.0);Other symptoms and signs involving the nervous system (R29.898)     Time: 0941-1000 PT Time Calculation (min) (ACUTE ONLY): 19 min  Charges:  $Gait Training: 8-22 mins                    G Codes:       [place], [place], [place] 09/09/2016, 11:14 AM

## 2016-09-09 NOTE — Care Management Note (Signed)
Case Management Note  Patient Details  Name: Takeyla Million MRN: [id number] Date of Birth: 11-05-1945  Subjective/Objective:                    Action/Plan: Pt discharging to CIR today. No further needs per CM.   Expected Discharge Date:  09/09/16               Expected Discharge Plan:  [place]  In-House Referral:     Discharge planning Services     Post Acute Care Choice:    Choice offered to:     DME Arranged:    DME Agency:     HH Arranged:    HH Agency:     Status of Service:  Completed, signed off  If discussed at [employer], dates discussed:    Additional Comments:  Pollie Friar, RN 09/09/2016, 10:37 AM

## 2016-09-09 NOTE — Progress Notes (Signed)
Report given to 4 [facility]. Family and patient aware of discharge to [place] at this time. Per 4W nurse, room is not ready at this time and will notify 5C to transfer pt once room is ready. Family aware.  Ave Filter, RN

## 2016-09-09 NOTE — Progress Notes (Signed)
Occupational Therapy Treatment Patient Details Name: Kimberly Vaughn Name MRN: [id number] DOB: [birth year] Today's Date: 09/09/2016    History of present illness Pt is a 71 y.o. female with a history of rapid progression of L sided hearing loss and facial droop with repeat MRI's ultimately revealing enhancement of the vestibulocochlear nerve complex as well as the facial nerve primarily within the temporal bone and enhancing nodule measuring more than 2 cm at root entry zone in brainstem. Pt also with L sided facial numbness including all 3 distributions of the trigeminal nerve.  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 She has PMH significant for but not limited to  A-fib, coronary artery disease, DDD lumbar, GERD, myocardial infarction, nodule of parotid gland, and pulmonary nodule.    OT comments  Pt making excellent progress towards goals today. Session focused on seated ADL after stand pivot toilet transfer and peri care on BSC (Pt able to bathe, perform oral care, deodorant application) improved use of LUE, better coordination for bilateral hand tasks. Pt also reports that her vision is improving and she can see the TV to watch it now - but not able to read yet. Pt very very pleasant and excited and willing to work with therapy. Pt continues to benefit from skilled OT in the acute setting and will require CIR level therapy (and can withstand intense therapy) to return to PLOF.   Follow Up Recommendations  CIR;Supervision/Assistance - 24 hour    Equipment Recommendations  Other (comment) (defer to next venue of care)    Recommendations for Other Services      Precautions / Restrictions Precautions Precautions: Fall Precaution Comments: Does not hear out of L ear; L eye with blurred vision. (but improving) Restrictions Weight Bearing Restrictions: No       Mobility Bed Mobility Overal  bed mobility: Needs Assistance Bed Mobility: Sit to Supine       Sit to supine: Min guard   General bed mobility comments: no physical assist needed  Transfers Overall transfer level: Needs assistance Equipment used: Rolling walker (2 wheeled) Transfers: Sit to/from Stand Sit to Stand: Min assist         General transfer comment: Min assist for stability during power up. VCs for hand placement and positioning    Balance Overall balance assessment: Needs assistance Sitting-balance support: Feet supported;No upper extremity supported Sitting balance-Leahy Scale: Fair     Standing balance support: Bilateral upper extremity supported;During functional activity;No upper extremity supported Standing balance-Leahy Scale: Poor Standing balance comment: B UE support and external assist required.                High Level Balance Comments: min assist due to ataxic LLE during stand pivot           ADL either performed or assessed with clinical judgement   ADL Overall ADL's : Needs assistance/impaired     Grooming: Wash/dry hands;Wash/dry face;Oral care;Applying deodorant;Set up;Sitting Grooming Details (indicate cue type and reason): in recliner Upper Body Bathing: Set up;Sitting Upper Body Bathing Details (indicate cue type and reason): in recliner Lower Body Bathing: Set up;Sitting/lateral leans Lower Body Bathing Details (indicate cue type and reason): in recliner Upper Body Dressing : Minimal assistance;Sitting Upper Body Dressing Details (indicate cue type and reason): assist to snap gown     Toilet Transfer: Minimal assistance;Stand-pivot;BSC;RW Toilet Transfer Details (indicate cue type and reason): stand pivot to the left Toileting- Clothing Manipulation  and Hygiene: Moderate assistance;Sit to/from stand Toileting - Clothing Manipulation Details (indicate cue type and reason): Pt able to perform front peri care and assist needed for assist with rear peri  care       General ADL Comments: Pt reports increased coordination with LUE. Pt with ataxic movement, but better able to judge and reach for items during grooming. Paulita Cradle to open toothpaste and deoderant that require BUE coordination     Vision       Perception     Praxis      Cognition Arousal/Alertness: Awake/alert Behavior During Therapy: WFL for tasks assessed/performed Overall Cognitive Status: Within Functional Limits for tasks assessed                                          Exercises Exercises:  (LLE SLR in bed with emphasis on controlled lowering x5)   Shoulder Instructions       General Comments Pt very excited about going to CIR today    Pertinent Vitals/ Pain       Pain Assessment: No/denies pain Faces Pain Scale: No hurt  Home Living                                          Prior Functioning/Environment              Frequency  Min 3X/week        Progress Toward Goals  OT Goals(current goals can now be found in the care plan section)  Progress towards OT goals: Progressing toward goals  Acute Rehab OT Goals Patient Stated Goal: to get back to independence so she can travel OT Goal Formulation: With patient Time For Goal Achievement: 09/21/16 Potential to Achieve Goals: Good  Plan Discharge plan remains appropriate;Frequency remains appropriate    Co-evaluation                 AM-PAC PT "6 Clicks" Daily Activity     Outcome Measure   Help from another person eating meals?: A Little Help from another person taking care of personal grooming?: A Little Help from another person toileting, which includes using toliet, bedpan, or urinal?: A Little Help from another person bathing (including washing, rinsing, drying)?: A Little Help from another person to put on and taking off regular upper body clothing?: A Little Help from another person to put on and taking off regular lower body clothing?: A  Lot 6 Click Score: 17    End of Session Equipment Utilized During Treatment: Gait belt;Rolling walker  OT Visit Diagnosis: Other abnormalities of gait and mobility (R26.89);Ataxia, unspecified (R27.0);Hemiplegia and hemiparesis Hemiplegia - Right/Left: Left Hemiplegia - dominant/non-dominant: Non-Dominant   Activity Tolerance Patient tolerated treatment well   Patient Left in chair;with call bell/phone within reach;with chair alarm set   Nurse Communication Mobility status        Time: 7741-2878 OT Time Calculation (min): 32 min  Charges: OT General Charges $OT Visit: 1 Procedure OT Treatments $Self Care/Home Management : 23-37 mins  Hulda Humphrey OTR/L [place] 09/09/2016, 10:47 AM

## 2016-09-09 NOTE — Progress Notes (Signed)
Patient ID: Kimberly Vaughn, female   DOB: October 31, 1945, 71 y.o.   MRN: [id number] Patient arrived from [id number] with RN and patient belongings. Family arrived to room earlier. Report given to night shift RN.

## 2016-09-09 NOTE — Progress Notes (Signed)
Gunnar Fusi [facility] Admission Coordinator Signed Physical Medicine and Rehabilitation  PMR Pre-admission Date of Service: 09/09/2016 1:30 PM  Related encounter: Admission (Discharged) from 09/04/2016 in [place]       [] Hide copied text PMR Admission Coordinator Pre-Admission Assessment  Patient: Kimberly Vaughn is an 71 y.o., female MRN: [id number] DOB: 09-13-1945 Height: 5\' 1"  (154.9 cm) Weight: 84.2 kg (185 lb 10 oz)                                                                                                                                                  Insurance Information HMO:     PPO: X     PCP:      IPA:      80/20:      OTHER:  PRIMARY: U[employer] Medicare       Policy#: [id number]      Subscriber: Self CM Name: Vevelyn Royals       Phone#: [phone]     Fax#: [phone] Pre-Cert#: [id number] given by Orvan July     Employer: Retired Benefits:  Phone #: Verified online      Name:  Irene Shipper. 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 History taken from chart review and patient report.  Patient with CAD and MI requiring stent 03/2016 therefore surgery postponed with close observation. Follow up MRI showed marked enhancement of brainstem nodule with left facial numbness involving all three distributions of trigeminal nerve and concerns of malignancy and she was cleared for surgery by cardiology. She was admitted on 09/04/16 for retrosigmoid craniectomy for tumor resection by Dr. Kathyrn Sheriff. Swallow evaluation done due to significant facial weakness and numbness with recommendations for regular textures and thin liquids with chin tuck to prevent aspiration/penetration. Post-op  MRI reviewed, showing partial resection of brainstem tumor. Therapy ongoing and patient limited by left visual deficits, balance deficits with ataxic gait and issues with intermittent dizziness/nausea with activity. CIR recommended due to deficits in mobility and ability to carry out ADL tasks. Patient admitted to [facility] 09/09/16.   Past Medical History      Past Medical History:  Diagnosis Date  . A-fib ([place])   . Anginal pain ([place])   . Asthma    as a child  . Coronary artery disease   . DDD (degenerative disc disease), lumbar   . Degenerative joint disease   . Dizziness and giddiness   . Dysrhythmia    A fib  . Episodic atrial fibrillation ([place])   . Esophageal reflux   . GERD (gastroesophageal reflux disease)   . Hiatal hernia   . History of kidney stones   . Hypertension, essential   . Kidney stones   . Liver cyst LEFT  . Myocardial infarction ([place])   . Nodule of parotid gland   . Pneumonia   . PONV (postoperative nausea and vomiting)   . Pulmonary nodule   . PVC (premature ventricular contraction)    Nov 2017 & Feb 2018 noted on cardiac monitor worn at home    Family History  family history includes Heart attack (age of onset: 88) in her father; Heart disease in her father; Heart disease (age of onset: 46) in her mother; Hypertension in  her daughter and other; Lymphoma in her other and sister; Melanoma in her brother and other; Osteoarthritis in her other; Psoriasis in her mother.  Prior Rehab/Hospitalizations:  Has the patient had major surgery during 100 days prior to admission? No  Current Medications   Current Facility-Administered Medications:  .  0.9 %  sodium chloride infusion, , Intravenous, Continuous, Consuella Lose, MD, Stopped at 09/09/16 1100 .  aspirin EC tablet 81 mg, 81 mg, Oral, Daily, Ashok Pall, MD, 81 mg at 09/09/16 1104 .  atorvastatin (LIPITOR) tablet 80 mg, 80 mg, Oral, q1800, Consuella Lose, MD, 80 mg at 09/08/16 1714 .  bisacodyl (DULCOLAX) suppository 10 mg, 10 mg, Rectal, Daily PRN, Consuella Lose, MD .  cholecalciferol (VITAMIN D) tablet 2,000 Units, 2,000 Units, Oral, Daily, Consuella Lose, MD, 2,000 Units at 09/09/16 1104 .  [COMPLETED] dexamethasone (DECADRON) injection 6 mg, 6 mg, Intravenous, Q6H, 6 mg at 09/05/16 0700 **FOLLOWED BY** [COMPLETED] dexamethasone (DECADRON) injection 4 mg, 4 mg, Intravenous, Q6H, 4 mg at 09/06/16 0551 **FOLLOWED BY** dexamethasone (DECADRON) injection 4 mg, 4 mg, Intravenous, Q8H, Consuella Lose, MD, 4 mg at 09/09/16 0557 .  docusate sodium (COLACE) capsule 100 mg, 100 mg, Oral, BID, Consuella Lose, MD, 100 mg at 09/09/16 1104 .  feeding supplement (BOOST / RESOURCE BREEZE) liquid 1 Container, 1 Container, Oral, TID BM, Consuella Lose, MD, 1 Container at 09/08/16 2142 .  hydrochlorothiazide (HYDRODIURIL) tablet 12.5 mg, 12.5 mg, Oral, q morning - 10a, Consuella Lose, MD, 12.5 mg at 09/09/16 1103 .  HYDROcodone-acetaminophen (NORCO/VICODIN) 5-325 MG per tablet 1 tablet, 1 tablet, Oral, Q4H PRN, Consuella Lose, MD .  meclizine (ANTIVERT) tablet 25 mg, 25 mg, Oral, TID PRN, Consuella Lose, MD .  metoprolol tartrate (LOPRESSOR) tablet 25 mg, 25 mg, Oral, BID, Consuella Lose, MD, 25 mg at 09/09/16 1103 .  nitroGLYCERIN  (NITROSTAT) SL tablet 0.4 mg, 0.4 mg, Sublingual, Q5 min PRN, Consuella Lose, MD .  ondansetron (ZOFRAN) tablet 4 mg, 4 mg, Oral, Q4H PRN **OR** ondansetron (ZOFRAN) injection  4 mg, 4 mg, Intravenous, Q4H PRN, Consuella Lose, MD, 4 mg at 09/07/16 1211 .  pantoprazole (PROTONIX) EC tablet 80 mg, 80 mg, Oral, Q1200, Consuella Lose, MD, 80 mg at 09/08/16 1200 .  promethazine (PHENERGAN) tablet 12.5-25 mg, 12.5-25 mg, Oral, Q4H PRN, Consuella Lose, MD, 25 mg at 09/05/16 1207 .  vitamin B-12 (CYANOCOBALAMIN) tablet 5,000 mcg, 5,000 mcg, Oral, Daily, Consuella Lose, MD, 5,000 mcg at 09/09/16 1103  Patients Current Diet: DIET DYS 3 Room service appropriate? Yes with Assist; Fluid consistency: Thin Diet general  Precautions / Restrictions Precautions Precautions: Fall Precaution Comments: Does not hear out of L ear; L eye with blurred vision. (but improving) Restrictions Weight Bearing Restrictions: No   Has the patient had 2 or more falls or a fall with injury in the past year?No  Prior Activity Level Community (5-7x/wk): Prior to admission patient was fully independent.  She is a retired and lives near close family and friends who she helped out.   Home Assistive Devices / Equipment Home Assistive Devices/Equipment: Eyeglasses, Radio producer (specify quad or straight) Home Equipment: Shower seat, Cane - single point, Grab bars - tub/shower, Grab bars - toilet, Hand held shower head (walking stick in addition to cane)  Prior Device Use: Indicate devices/aids used by the patient prior to current illness, exacerbation or injury? Walking stick for 1 week prior to admission  Prior Functional Level Prior Function Level of Independence: Independent (Used walking stick for 1 week PTA at the onset of symptoms) Comments: Used the walking stick and furniture cruised for support  Self Care: Did the patient need help bathing, dressing, using the toilet or eating?  Independent  Indoor Mobility: Did the patient need assistance with walking from room to room (with or without device)? Independent  Stairs: Did the patient need assistance with internal or external stairs (with or without device)? Independent  Functional Cognition: Did the patient need help planning regular tasks such as shopping or remembering to take medications? Independent  Current Functional Level Cognition  Overall Cognitive Status: Within Functional Limits for tasks assessed Orientation Level: Oriented X4    Extremity Assessment (includes Sensation/Coordination)  Upper Extremity Assessment: Defer to OT evaluation LUE Deficits / Details: Strength WFL and pt reports sensation in-tact per pt report but will continue to formally assess. Decreased gross and fine motor coordination. Overshooting and undershooting noted with all reaching to target.  LUE Coordination: decreased fine motor, decreased gross motor  Lower Extremity Assessment: LLE deficits/detail LLE Deficits / Details: Strength WFL however pt reports decreased sensation to light touch in LLE. Reports numbness but no tingling.  LLE Sensation: decreased light touch    ADLs  Overall ADL's : Needs assistance/impaired Eating/Feeding: Set up, Sitting, Minimal assistance Grooming: Wash/dry hands, Wash/dry face, Oral care, Applying deodorant, Set up, Sitting Grooming Details (indicate cue type and reason): in recliner Upper Body Bathing: Set up, Sitting Upper Body Bathing Details (indicate cue type and reason): in recliner Lower Body Bathing: Set up, Sitting/lateral leans Lower Body Bathing Details (indicate cue type and reason): in recliner Upper Body Dressing : Minimal assistance, Sitting Upper Body Dressing Details (indicate cue type and reason): assist to snap gown Lower Body Dressing: Moderate assistance, Sit to/from stand Toilet Transfer: Minimal assistance, Stand-pivot, BSC, RW Toilet Transfer Details (indicate  cue type and reason): stand pivot to the left Toileting- Clothing Manipulation and Hygiene: Moderate assistance, Sit to/from stand Toileting - Clothing Manipulation Details (indicate cue type and reason): Pt able to perform front peri care  and assist needed for assist with rear peri care Functional mobility during ADLs: Moderate assistance, +2 for safety/equipment, Rolling walker General ADL Comments: Pt reports increased coordination with LUE. Pt with ataxic movement, but better able to judge and reach for items during grooming. Abe to open toothpaste and deoderant that require BUE coordination    Mobility  Overal bed mobility: Needs Assistance Bed Mobility: Sit to Supine Supine to sit: Min assist Sit to supine: Min guard General bed mobility comments: pt OOB in recliner chair upon arrival    Transfers  Overall transfer level: Needs assistance Equipment used: Rolling walker (2 wheeled) Transfers: Sit to/from Stand Sit to Stand: Min guard General transfer comment: increased time and effort, vc'ing for bilateral hand placement, min guard for safety and stability with transition into standing from chair. pt performed sit<>stand x2 from chair    Ambulation / Gait / Stairs / Wheelchair Mobility  Ambulation/Gait Ambulation/Gait assistance: Museum/gallery curator (Feet): 50 Feet (50' x2 with sitting rest break in between) Assistive device: Rolling walker (2 wheeled) Gait Pattern/deviations: Step-through pattern, Decreased stride length, Trunk flexed, Ataxic General Gait Details: pt with poor coordination of L LE, cueing for quad activation during stance phase on L. pt maintained visual gaze downward as she stated that when she looks forward she gets very dizzy. Gait velocity: Decreased Gait velocity interpretation: Below normal speed for age/gender    Posture / Balance Balance Overall balance assessment: Needs assistance Sitting-balance support: Feet supported, No upper  extremity supported Sitting balance-Leahy Scale: Fair Standing balance support: Bilateral upper extremity supported, During functional activity Standing balance-Leahy Scale: Poor Standing balance comment: pt reliant on bilateral UEs on RW High level balance activites: Direction changes, Turns High Level Balance Comments: min assist due to ataxic LLE during stand pivot    Special needs/care consideration BiPAP/CPAP: No CPM: No Continuous Drip IV: No Dialysis: No         Life Vest: No Oxygen: No Special Bed: No Trach Size: No Wound Vac (area): No       Skin: Dry, Ecchymosis to bilateral arms and legs                               Bowel mgmt: 09/08/16 Continent  Bladder mgmt: Continent  Diabetic mgmt: No     Previous Home Environment Living Arrangements: Spouse/significant other Available Help at Discharge: Family, Available 24 hours/day Type of Home: Mobile home Home Layout: One level Home Access: Stairs to enter, Ramped entrance Technical brewer of Steps: 1 Bathroom Shower/Tub: Multimedia programmer: Programmer, systems: Yes Home Care Services: No  Discharge Living Setting Plans for Discharge Living Setting: Patient's home, Lives with (comment) (Spouse) Type of Home at Discharge: House Discharge Home Layout: One level Discharge Home Access: Stairs to enter (metal ramp with door threshold, pt can modify if needed ) Entrance Stairs-Rails: Can reach both Entrance Stairs-Number of Steps: 2 landing and then 1/threshold  Discharge Bathroom Shower/Tub: Walk-in shower Discharge Bathroom Toilet: Standard Discharge Bathroom Accessibility: No Does the patient have any problems obtaining your medications?: No  Social/Family/Support Systems Patient Roles: Spouse, Parent, Other (Comment) (Friend ) Contact Information: Daughter: Venetia Night  Anticipated Caregiver: Husband will be there; friends to drive as needed  Anticipated Caregiver's  Contact Information: Daughter Jeani Hawking [phone] Ability/Limitations of Caregiver: Husband can provide assist to get things, distant supervision  Caregiver Availability: Intermittent Discharge Plan Discussed with Primary Caregiver: Yes Is Caregiver In  Agreement with Plan?: Yes Does Caregiver/Family have Issues with Lodging/Transportation while Pt is in Rehab?: No  Goals/Additional Needs Patient/Family Goal for Rehab: PT/OT/SLP Mod I  Expected length of stay: 7-10 days  Cultural Considerations: "I believe in God." Dietary Needs: Dys.3 textures and thin liquids  Equipment Needs: TBD Special Service Needs: None Additional Information: With notice grab bars and front access modifications can be made  Pt/Family Agrees to Admission and willing to participate: Yes Program Orientation Provided & Reviewed with Pt/Caregiver Including Roles  & Responsibilities: Yes Additional Information Needs: Update for recommended home modifications as needed  Information Needs to be Provided By: Therapy team   Decrease burden of Care through IP rehab admission: No  Possible need for SNF placement upon discharge: No  Patient Condition: This patient's medical and functional status has changed since the consult dated 09/07/16 in which the Rehabilitation Physician determined and documented that the patient was potentially appropriate for intensive rehabilitative care in an inpatient rehabilitation facility. Issues have been addressed and update has been discussed with Dr. Posey Pronto and patient now appropriate for inpatient rehabilitation. Will admit to inpatient rehab today.   Preadmission Screen Completed By:  Gunnar Fusi, 09/09/2016 1:30 PM ______________________________________________________________________   Discussed status with Dr. Posey Pronto on 09/09/16 at 1350 and received telephone approval for admission today.  Admission Coordinator:  Gunnar Fusi, time 1350/Date 09/09/16       Cosigned by: Jamse Arn, MD at 09/09/2016 1:53 PM  Revision History

## 2016-09-09 NOTE — PMR Pre-admission (Signed)
PMR Admission Coordinator Pre-Admission Assessment  Patient: Kimberly Vaughn is an 71 y.o., female MRN: [id number] DOB: 1945-08-16 Height: 5\' 1"  (154.9 cm) Weight: 84.2 kg (185 lb 10 oz)              Insurance Information HMO:     PPO: X     PCP:      IPA:      80/20:      OTHER:  PRIMARY: UHC Medicare       Policy#: [id number]      Subscriber: Self CM Name: Vevelyn Royals       Phone#: [phone]     Fax#: [phone] Pre-Cert#: [id number] given by Orvan July     Employer: Retired Benefits:  Phone #: Verified online      Name:  Irene Shipper. 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 History taken from chart review and patient report. Patient with CAD and MI requiring stent 03/2016 therefore surgery postponed with close observation. Follow up MRI showed marked enhancement of brainstem nodule with left facial numbness involving all three distributions of trigeminal nerve and concerns of malignancy and she was cleared for surgery by cardiology. She was admitted on 09/04/16 for retrosigmoid craniectomy for tumor resection by Dr. Kathyrn Sheriff. Swallow evaluation done due to  significant facial weakness and numbness with recommendations for regular textures and thin liquids with chin tuck to prevent aspiration/penetration. Post-op MRI reviewed, showing partial resection of brainstem tumor. Therapy ongoing and patient limited by left visual deficits, balance deficits with ataxic gait and issues with intermittent dizziness/nausea with activity.  CIR recommended due to deficits in mobility and ability to carry out ADL tasks. Patient admitted to [facility] 09/09/16.       Past Medical History  Past Medical History:  Diagnosis Date  . A-fib ([place])   . Anginal pain ([place])   . Asthma    as a child  . Coronary artery disease   . DDD (degenerative disc disease), lumbar   . Degenerative joint disease   . Dizziness and giddiness   . Dysrhythmia    A fib  . Episodic atrial fibrillation ([place])   . Esophageal reflux   . GERD (gastroesophageal reflux disease)   . Hiatal hernia   . History of kidney stones   . Hypertension, essential   . Kidney stones   . Liver cyst LEFT  . Myocardial infarction ([place])   . Nodule of parotid gland   .  Pneumonia   . PONV (postoperative nausea and vomiting)   . Pulmonary nodule   . PVC (premature ventricular contraction)    Nov 2017 & Feb 2018 noted on cardiac monitor worn at home    Family History  family history includes Heart attack (age of onset: 21) in her father; Heart disease in her father; Heart disease (age of onset: 6) in her mother; Hypertension in her daughter and other; Lymphoma in her other and sister; Melanoma in her brother and other; Osteoarthritis in her other; Psoriasis in her mother.  Prior Rehab/Hospitalizations:  Has the patient had major surgery during 100 days prior to admission? No  Current Medications   Current Facility-Administered Medications:  .  0.9 %  sodium chloride infusion, , Intravenous, Continuous, Consuella Lose, MD, Stopped at 09/09/16 1100 .  aspirin EC tablet 81  mg, 81 mg, Oral, Daily, Ashok Pall, MD, 81 mg at 09/09/16 1104 .  atorvastatin (LIPITOR) tablet 80 mg, 80 mg, Oral, q1800, Consuella Lose, MD, 80 mg at 09/08/16 1714 .  bisacodyl (DULCOLAX) suppository 10 mg, 10 mg, Rectal, Daily PRN, Consuella Lose, MD .  cholecalciferol (VITAMIN D) tablet 2,000 Units, 2,000 Units, Oral, Daily, Consuella Lose, MD, 2,000 Units at 09/09/16 1104 .  [COMPLETED] dexamethasone (DECADRON) injection 6 mg, 6 mg, Intravenous, Q6H, 6 mg at 09/05/16 0700 **FOLLOWED BY** [COMPLETED] dexamethasone (DECADRON) injection 4 mg, 4 mg, Intravenous, Q6H, 4 mg at 09/06/16 0551 **FOLLOWED BY** dexamethasone (DECADRON) injection 4 mg, 4 mg, Intravenous, Q8H, Consuella Lose, MD, 4 mg at 09/09/16 0557 .  docusate sodium (COLACE) capsule 100 mg, 100 mg, Oral, BID, Consuella Lose, MD, 100 mg at 09/09/16 1104 .  feeding supplement (BOOST / RESOURCE BREEZE) liquid 1 Container, 1 Container, Oral, TID BM, Consuella Lose, MD, 1 Container at 09/08/16 2142 .  hydrochlorothiazide (HYDRODIURIL) tablet 12.5 mg, 12.5 mg, Oral, q morning - 10a, Consuella Lose, MD, 12.5 mg at 09/09/16 1103 .  HYDROcodone-acetaminophen (NORCO/VICODIN) 5-325 MG per tablet 1 tablet, 1 tablet, Oral, Q4H PRN, Consuella Lose, MD .  meclizine (ANTIVERT) tablet 25 mg, 25 mg, Oral, TID PRN, Consuella Lose, MD .  metoprolol tartrate (LOPRESSOR) tablet 25 mg, 25 mg, Oral, BID, Consuella Lose, MD, 25 mg at 09/09/16 1103 .  nitroGLYCERIN (NITROSTAT) SL tablet 0.4 mg, 0.4 mg, Sublingual, Q5 min PRN, Consuella Lose, MD .  ondansetron (ZOFRAN) tablet 4 mg, 4 mg, Oral, Q4H PRN **OR** ondansetron (ZOFRAN) injection 4 mg, 4 mg, Intravenous, Q4H PRN, Consuella Lose, MD, 4 mg at 09/07/16 1211 .  pantoprazole (PROTONIX) EC tablet 80 mg, 80 mg, Oral, Q1200, Consuella Lose, MD, 80 mg at 09/08/16 1200 .  promethazine (PHENERGAN) tablet 12.5-25 mg, 12.5-25 mg, Oral, Q4H PRN, Consuella Lose,  MD, 25 mg at 09/05/16 1207 .  vitamin B-12 (CYANOCOBALAMIN) tablet 5,000 mcg, 5,000 mcg, Oral, Daily, Consuella Lose, MD, 5,000 mcg at 09/09/16 1103  Patients Current Diet: DIET DYS 3 Room service appropriate? Yes with Assist; Fluid consistency: Thin Diet general  Precautions / Restrictions Precautions Precautions: Fall Precaution Comments: Does not hear out of L ear; L eye with blurred vision. (but improving) Restrictions Weight Bearing Restrictions: No   Has the patient had 2 or more falls or a fall with injury in the past year?No  Prior Activity Level Community (5-7x/wk): Prior to admission patient was fully independent.  She is a retired and lives near close family and friends who she helped out.   Home Assistive Devices / Equipment Home Assistive Devices/Equipment: Eyvonne Mechanic (specify  quad or straight) Home Equipment: Shower seat, Cane - single point, Grab bars - tub/shower, Grab bars - toilet, Hand held shower head (walking stick in addition to cane)  Prior Device Use: Indicate devices/aids used by the patient prior to current illness, exacerbation or injury? Walking stick for 1 week prior to admission  Prior Functional Level Prior Function Level of Independence: Independent (Used walking stick for 1 week PTA at the onset of symptoms) Comments: Used the walking stick and furniture cruised for support  Self Care: Did the patient need help bathing, dressing, using the toilet or eating? Independent  Indoor Mobility: Did the patient need assistance with walking from room to room (with or without device)? Independent  Stairs: Did the patient need assistance with internal or external stairs (with or without device)? Independent  Functional Cognition: Did the patient need help planning regular tasks such as shopping or remembering to take medications? Independent  Current Functional Level Cognition  Overall Cognitive Status: Within Functional Limits for tasks  assessed Orientation Level: Oriented X4    Extremity Assessment (includes Sensation/Coordination)  Upper Extremity Assessment: Defer to OT evaluation LUE Deficits / Details: Strength WFL and pt reports sensation in-tact per pt report but will continue to formally assess. Decreased gross and fine motor coordination. Overshooting and undershooting noted with all reaching to target.  LUE Coordination: decreased fine motor, decreased gross motor  Lower Extremity Assessment: LLE deficits/detail LLE Deficits / Details: Strength WFL however pt reports decreased sensation to light touch in LLE. Reports numbness but no tingling.  LLE Sensation: decreased light touch    ADLs  Overall ADL's : Needs assistance/impaired Eating/Feeding: Set up, Sitting, Minimal assistance Grooming: Wash/dry hands, Wash/dry face, Oral care, Applying deodorant, Set up, Sitting Grooming Details (indicate cue type and reason): in recliner Upper Body Bathing: Set up, Sitting Upper Body Bathing Details (indicate cue type and reason): in recliner Lower Body Bathing: Set up, Sitting/lateral leans Lower Body Bathing Details (indicate cue type and reason): in recliner Upper Body Dressing : Minimal assistance, Sitting Upper Body Dressing Details (indicate cue type and reason): assist to snap gown Lower Body Dressing: Moderate assistance, Sit to/from stand Toilet Transfer: Minimal assistance, Stand-pivot, BSC, RW Toilet Transfer Details (indicate cue type and reason): stand pivot to the left Toileting- Clothing Manipulation and Hygiene: Moderate assistance, Sit to/from stand Toileting - Clothing Manipulation Details (indicate cue type and reason): Pt able to perform front peri care and assist needed for assist with rear peri care Functional mobility during ADLs: Moderate assistance, +2 for safety/equipment, Rolling walker General ADL Comments: Pt reports increased coordination with LUE. Pt with ataxic movement, but better able to  judge and reach for items during grooming. Abe to open toothpaste and deoderant that require BUE coordination    Mobility  Overal bed mobility: Needs Assistance Bed Mobility: Sit to Supine Supine to sit: Min assist Sit to supine: Min guard General bed mobility comments: pt OOB in recliner chair upon arrival    Transfers  Overall transfer level: Needs assistance Equipment used: Rolling walker (2 wheeled) Transfers: Sit to/from Stand Sit to Stand: Min guard General transfer comment: increased time and effort, vc'ing for bilateral hand placement, min guard for safety and stability with transition into standing from chair. pt performed sit<>stand x2 from chair    Ambulation / Gait / Stairs / Wheelchair Mobility  Ambulation/Gait Ambulation/Gait assistance: Museum/gallery curator (Feet): 50 Feet (50' x2 with sitting rest break in between) Assistive device: Rolling walker (2 wheeled) Gait  Pattern/deviations: Step-through pattern, Decreased stride length, Trunk flexed, Ataxic General Gait Details: pt with poor coordination of L LE, cueing for quad activation during stance phase on L. pt maintained visual gaze downward as she stated that when she looks forward she gets very dizzy. Gait velocity: Decreased Gait velocity interpretation: Below normal speed for age/gender    Posture / Balance Balance Overall balance assessment: Needs assistance Sitting-balance support: Feet supported, No upper extremity supported Sitting balance-Leahy Scale: Fair Standing balance support: Bilateral upper extremity supported, During functional activity Standing balance-Leahy Scale: Poor Standing balance comment: pt reliant on bilateral UEs on RW High level balance activites: Direction changes, Turns High Level Balance Comments: min assist due to ataxic LLE during stand pivot    Special needs/care consideration BiPAP/CPAP: No CPM: No Continuous Drip IV: No Dialysis: No         Life Vest: No Oxygen:  No Special Bed: No Trach Size: No Wound Vac (area): No       Skin: Dry, Ecchymosis to bilateral arms and legs                               Bowel mgmt: 09/08/16 Continent  Bladder mgmt: Continent  Diabetic mgmt: No     Previous Home Environment Living Arrangements: Spouse/significant other Available Help at Discharge: Family, Available 24 hours/day Type of Home: Mobile home Home Layout: One level Home Access: Stairs to enter, Ramped entrance Technical brewer of Steps: 1 Bathroom Shower/Tub: Multimedia programmer: Programmer, systems: Yes Home Care Services: No  Discharge Living Setting Plans for Discharge Living Setting: Patient's home, Lives with (comment) (Spouse) Type of Home at Discharge: House Discharge Home Layout: One level Discharge Home Access: Stairs to enter (metal ramp with door threshold, pt can modify if needed ) Entrance Stairs-Rails: Can reach both Entrance Stairs-Number of Steps: 2 landing and then 1/threshold  Discharge Bathroom Shower/Tub: Walk-in shower Discharge Bathroom Toilet: Standard Discharge Bathroom Accessibility: No Does the patient have any problems obtaining your medications?: No  Social/Family/Support Systems Patient Roles: Spouse, Parent, Other (Comment) (Friend ) Contact Information: Daughter: Venetia Night  Anticipated Caregiver: Husband will be there; friends to drive as needed  Anticipated Caregiver's Contact Information: Daughter Jeani Hawking [phone] Ability/Limitations of Caregiver: Husband can provide assist to get things, distant supervision  Caregiver Availability: Intermittent Discharge Plan Discussed with Primary Caregiver: Yes Is Caregiver In Agreement with Plan?: Yes Does Caregiver/Family have Issues with Lodging/Transportation while Pt is in Rehab?: No  Goals/Additional Needs Patient/Family Goal for Rehab: PT/OT/SLP Mod I  Expected length of stay: 7-10 days  Cultural Considerations: "I believe in  God." Dietary Needs: Dys.3 textures and thin liquids  Equipment Needs: TBD Special Service Needs: None Additional Information: With notice grab bars and front access modifications can be made  Pt/Family Agrees to Admission and willing to participate: Yes Program Orientation Provided & Reviewed with Pt/Caregiver Including Roles  & Responsibilities: Yes Additional Information Needs: Update for recommended home modifications as needed  Information Needs to be Provided By: Therapy team   Decrease burden of Care through IP rehab admission: No  Possible need for SNF placement upon discharge: No  Patient Condition: This patient's medical and functional status has changed since the consult dated 09/07/16 in which the Rehabilitation Physician determined and documented that the patient was potentially appropriate for intensive rehabilitative care in an inpatient rehabilitation facility. Issues have been addressed and update has been discussed with Dr. Posey Pronto and  patient now appropriate for inpatient rehabilitation. Will admit to inpatient rehab today.   Preadmission Screen Completed By:  Gunnar Fusi, 09/09/2016 1:30 PM ______________________________________________________________________   Discussed status with Dr. Posey Pronto on 09/09/16 at 1350 and received telephone approval for admission today.  Admission Coordinator:  Gunnar Fusi, time 1350/Date 09/09/16

## 2016-09-09 NOTE — Discharge Summary (Signed)
Physician Discharge Summary  Patient ID: Kimberly Vaughn MRN: [id number] DOB/AGE: 73-Jun-1947 73 y.o.  Admit date: 09/04/2016 Discharge date: 09/09/2016  Admission Diagnoses:  Brain tumor  Discharge Diagnoses:  Same Active Problems:   Mass of brain   Brain tumor [facility])   History of MI (myocardial infarction)   Acute blood loss anemia   Hypokalemia   Post-operative pain  Discharged Condition: Stable  Hospital Course:  Kimberly Vaughn is a 71 y.o. female who was admitted for the below procedure. There were no post operative complications. At time of discharge, pain was well controlled, ambulating with Pt/OT, tolerating po, voiding normal. Ready for discharge. PT/OT rec CIR. Agree with assessment. Can restart [place] Friday, 09/11/2016  Treatments: Surgery - Retrosigmoid craniectomy for biopsy/resection of left-sided CPA mass  Discharge Exam: Blood pressure (!) 162/67, pulse 75, temperature 98.6 F (37 C), temperature source Oral, resp. rate 18, height 5\' 1"  (1.549 m), weight 84.2 kg (185 lb 10 oz), SpO2 98 %. Awake, alert, oriented  Speech fluent, appropriate  Left facial droop, left facial numbness, no hearing on left - stable 5/5 BUE/BLE  Left dysmetria - stable  Disposition: CIR  Discharge Instructions    Call MD for:  difficulty breathing, headache or visual disturbances    Complete by:  As directed    Call MD for:  persistant dizziness or light-headedness    Complete by:  As directed    Call MD for:  redness, tenderness, or signs of infection (pain, swelling, redness, odor or green/yellow discharge around incision site)    Complete by:  As directed    Call MD for:  severe uncontrolled pain    Complete by:  As directed    Call MD for:  temperature >100.4    Complete by:  As directed    Diet general    Complete by:  As directed    Driving Restrictions    Complete by:  As directed    Do not drive until given clearance.   For home use only DME 4 wheeled rolling walker  with seat    Complete by:  As directed    Patient needs a walker to treat with the following condition:  Gait instability   Increase activity slowly    Complete by:  As directed    Lifting restrictions    Complete by:  As directed    Do not lift anything >10lbs. Avoid bending and twisting in awkward positions. Avoid bending at the back.     Allergies as of 09/09/2016      Reactions   No Known Allergies       Medication List    TAKE these medications   aspirin EC 81 MG tablet Take 81 mg by mouth daily.   atorvastatin 80 MG tablet Commonly known as:  LIPITOR TAKE 1 TABLET BY MOUTH IN THE AFTERNOON   BRILINTA 90 MG Tabs tablet Generic drug:  ticagrelor Take 1 tablet (90 mg total) by mouth 2 (two) times daily. Start taking on:  09/11/2016 What changed:  See the new instructions.  These instructions start on 09/11/2016. If you are unsure what to do until then, ask your doctor or other care provider.   esomeprazole 40 MG capsule Commonly known as:  NEXIUM Take 40 mg by mouth daily.   hydrochlorothiazide 12.5 MG tablet Commonly known as:  HYDRODIURIL TAKE 1 TABLET BY MOUTH EVERY MORNING   HYDROcodone-acetaminophen 5-325 MG tablet Commonly known as:  NORCO/VICODIN Take 1 tablet by  mouth every 4 (four) hours as needed for moderate pain.   meclizine 25 MG tablet Commonly known as:  ANTIVERT Take 25 mg by mouth 3 (three) times daily as needed for dizziness.   metoprolol tartrate 25 MG tablet Commonly known as:  LOPRESSOR Take 25 mg by mouth 2 (two) times daily.   nitroGLYCERIN 0.4 MG SL tablet Commonly known as:  NITROSTAT Place 0.4 mg under the tongue every 5 (five) minutes as needed for chest pain.   Vitamin B-12 5000 MCG Subl Place 5,000 mcg under the tongue daily.   Vitamin D3 2000 units Tabs Take 2,000 Units by mouth daily.            Durable Medical Equipment        Start     Ordered   09/09/16 0000  For home use only DME 4 wheeled rolling walker  with seat    Question:  Patient needs a walker to treat with the following condition  Answer:  Gait instability   09/09/16 0859     Follow-up Information    Lisbeth Renshaw, MD. Schedule an appointment as soon as possible for a visit in 10 day(s).   Specialty:  Neurosurgery Why:  Staple removal Contact information: [address] [place] [address] [phone]           Signed: Alyson Ingles 09/09/2016, 8:59 AM

## 2016-09-09 NOTE — H&P (Signed)
Physical Medicine and Rehabilitation Admission H&P    CC: Balance deficits with dysphagia due to facial/vestibularcochlear nerve mass   HPI:  Kimberly Vaughn is a 71 y.o. 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 History taken from chart review and patient. Patient with CAD and MI requiring stent 03/2016  therefore surgery postponed with close observation. Follow up MRI showed marked enhancement of brainstem nodule with left facial numbness involving all three distributions of trigeminal nerve and concerns of malignancy and she was cleared for surgery by cards.  She was admitted on 09/04/16 for retrosigmoid craniectomy for tumor resection by Dr. Kathyrn Sheriff. Swallow evaluation done due to significant facial weakness and numbness--regular,thins with chin tuck recommended to prevent aspiration/penetration. Post-op MRI reviewed, showing partial resection of brainstem tumor. Therapy ongoing and patient limited by left visual deficits, balance deficits with ataxic gait and issues with intermittent dizziness/nausea with activity.  CIR recommended due to deficits in mobility and ability to carry out ADL tasks   Review of Systems  HENT: Positive for hearing loss (left ear). Negative for tinnitus.   Eyes: Positive for blurred vision (left eye) and double vision (especially in am).  Respiratory: Positive for sputum production. Negative for cough and shortness of breath.   Cardiovascular: Positive for palpitations (occasionally). Negative for chest pain.  Gastrointestinal: Positive for diarrhea (couple episodes yesterday) and heartburn. Negative for abdominal pain and constipation.  Genitourinary: Positive for frequency and urgency. Negative for dysuria.  Musculoskeletal: Negative for myalgias.  Skin: Negative for itching and rash.  Neurological: Positive for dizziness (with activity), sensory change,  speech change and focal weakness. Negative for headaches.  Psychiatric/Behavioral: Negative for memory loss. The patient does not have insomnia.   All other systems reviewed and are negative.    Past Medical History:  Diagnosis Date  . A-fib ([place])   . Anginal pain ([place])   . Asthma    as a child  . Coronary artery disease   . DDD (degenerative disc disease), lumbar   . Degenerative joint disease   . Dizziness and giddiness   . Dysrhythmia    A fib  . Episodic atrial fibrillation ([place])   . Esophageal reflux   . GERD (gastroesophageal reflux disease)   . Hiatal hernia   . History of kidney stones   . Hypertension, essential   . Kidney stones   . Liver cyst LEFT  . Myocardial infarction ([place])   . Nodule of parotid gland   . Pneumonia   . PONV (postoperative nausea and vomiting)   . Pulmonary nodule   . PVC (premature ventricular contraction)    Nov 2017 & Feb 2018 noted on cardiac monitor worn at home   Past Surgical History:  Procedure Laterality Date  . APPLICATION OF CRANIAL NAVIGATION N/A 09/04/2016   Procedure: APPLICATION OF CRANIAL NAVIGATION;  Surgeon: Consuella Lose, MD;  Location: [facility];  Service: Neurosurgery;  Laterality: N/A;  . BREAST BIOPSY Bilateral   . CARPAL TUNNEL RELEASE Right 09/2013  . CATARACTS Bilateral   . CERVICAL SPINE SURGERY  04/2012  . FINGER SURGERY Right    TRIGGER FINGER RELEASE  . PARTIAL THYMECTOMY  1973   pt states doesn't remember this being done  . RETROSIGMOID CRANIECTOMY FOR TUMOR RESECTION Left 09/04/2016   Procedure: RETROSIGMOID CRANIECTOMY FOR TUMOR RESECTION WITH BRAINLAB NAVIGATION;  Surgeon: Consuella Lose, MD;  Location: [place];  Service: Neurosurgery;  Laterality: Left;  CRANIOTOMY TUMOR EXCISION  . TONSILLECTOMY  1964  . VAGINAL HYSTERECTOMY  1974    Family History  Problem Relation Age of Onset  . Heart disease Mother 25  . Psoriasis Mother        PUSTULAR  . Heart attack Father 87  . Heart disease Father   .  Lymphoma Sister   . Melanoma Brother   . Hypertension Daughter   . Lymphoma Other   . Osteoarthritis Other   . Melanoma Other   . Hypertension Other     Social History:  Married. Husband with medical issues and can provide supervision only. Retired three years ago. Per reports that she quit smoking about 10 years ago. Her smoking use included Cigarettes. She has never used smokeless tobacco. She reports that she does not drink alcohol or use drugs.     Allergies  Allergen Reactions  . No Known Allergies     Medications Prior to Admission  Medication Sig Dispense Refill  . aspirin EC 81 MG tablet Take 81 mg by mouth daily.    Marland Kitchen atorvastatin (LIPITOR) 80 MG tablet TAKE 1 TABLET BY MOUTH IN THE AFTERNOON  6  . Cholecalciferol (VITAMIN D3) 2000 units TABS Take 2,000 Units by mouth daily.    . Cyanocobalamin (VITAMIN B-12) 5000 MCG SUBL Place 5,000 mcg under the tongue daily.     Marland Kitchen esomeprazole (NEXIUM) 40 MG capsule Take 40 mg by mouth daily.  12  . hydrochlorothiazide (HYDRODIURIL) 12.5 MG tablet TAKE 1 TABLET BY MOUTH EVERY MORNING  6  . meclizine (ANTIVERT) 25 MG tablet Take 25 mg by mouth 3 (three) times daily as needed for dizziness.    . metoprolol tartrate (LOPRESSOR) 25 MG tablet Take 25 mg by mouth 2 (two) times daily.     . [DISCONTINUED] BRILINTA 90 MG TABS tablet TAKE 1 TABLET BY MOUTH TWICE DAILY  6  . nitroGLYCERIN (NITROSTAT) 0.4 MG SL tablet Place 0.4 mg under the tongue every 5 (five) minutes as needed for chest pain.      Home: Home Living Family/patient expects to be discharged to:: Private residence Living Arrangements: Spouse/significant other Available Help at Discharge: Family, Available 24 hours/day Type of Home: Mobile home Home Access: Stairs to enter, Ramped entrance Technical brewer of Steps: 1 Home Layout: One level Bathroom Shower/Tub: Multimedia programmer: Standard Bathroom Accessibility: Yes Home Equipment: Shower seat, Radio producer -  single point, Grab bars - tub/shower, Grab bars - toilet, Hand held shower head (walking stick in addition to cane)   Functional History: Prior Function Level of Independence: Independent with assistive device(s) Comments: Used the walking stick and furniture cruised for support  Functional Status:  Mobility: Bed Mobility Overal bed mobility: Needs Assistance Bed Mobility: Sit to Supine Supine to sit: Min assist Sit to supine: Min assist General bed mobility comments: Min assist to elevate LEs and reposition back to bed Transfers Overall transfer level: Needs assistance Equipment used: Rolling walker (2 wheeled) Transfers: Sit to/from Stand Sit to Stand: Min assist General transfer comment: Min assist for stability during power up. VCs for hand placement and positioning Ambulation/Gait Ambulation/Gait assistance: Min assist, +2 physical assistance Ambulation Distance (Feet): 30 Feet Assistive device: Rolling walker (2 wheeled) Gait Pattern/deviations: Step-through pattern, Decreased stride length, Trunk flexed, Ataxic General Gait Details: Decreased coordination of LLE, VCs for slow controlled cadence with emphasis on quadsetting and placement of LLE. Tactile cues for weight shift and posture. Heavy reliance on RW for UE  support. Gait velocity: Decreased Gait velocity interpretation: Below normal speed for age/gender    ADL: ADL Overall ADL's : Needs assistance/impaired Eating/Feeding: Set up, Sitting, Minimal assistance Grooming: Standing, Moderate assistance Grooming Details (indicate cue type and reason): Mod assist to maintain balance.  Upper Body Bathing: Moderate assistance, Sitting Lower Body Bathing: Moderate assistance, Sit to/from stand Upper Body Dressing : Moderate assistance, Sitting Lower Body Dressing: Moderate assistance, Sit to/from stand Toilet Transfer: Moderate assistance, +2 for safety/equipment, RW Toilet Transfer Details (indicate cue type and reason):  VC's for safety and use of RW.  Toileting- Clothing Manipulation and Hygiene: Moderate assistance, Sit to/from stand Toileting - Clothing Manipulation Details (indicate cue type and reason): Mod assist for safety and VC's for technique.  Functional mobility during ADLs: Moderate assistance, +2 for safety/equipment, Rolling walker General ADL Comments: Pt limited by decreased L sided coordination.   Cognition: Cognition Overall Cognitive Status: Within Functional Limits for tasks assessed Orientation Level: Oriented X4 Cognition Arousal/Alertness: Awake/alert Behavior During Therapy: WFL for tasks assessed/performed Overall Cognitive Status: Within Functional Limits for tasks assessed   Blood pressure (!) 162/67, pulse 75, temperature 98.6 F (37 C), temperature source Oral, resp. rate 18, height '5\' 1"'$  (1.549 m), weight 84.2 kg (185 lb 10 oz), SpO2 98 %. Physical Exam  Nursing note and vitals reviewed. Constitutional: She is oriented to person, place, and time. She appears well-developed.  Obese  HENT:  Head: Normocephalic and atraumatic.  Left crani incision healing well-- C/D/I  Eyes: Pupils are equal, round, and reactive to light. No scleral icterus.  Left lid lag with inability to fully close eye lid.  Left eye medially deviated  Neck: Normal range of motion. Neck supple.  Cardiovascular: An irregular rhythm present.  No murmur heard. Frequent PVCs  Respiratory: Effort normal and breath sounds normal. No stridor. No respiratory distress. She has no wheezes.  GI: Soft. Bowel sounds are normal. She exhibits no distension. There is no tenderness.  Musculoskeletal: She exhibits no edema or tenderness.  Neurological: She is alert and oriented to person, place, and time.  Significant facial weakness on left with sensory deficits--able to handle oral secretions without difficulty.  Mild dysarthria  Able to follow one and two step commands without difficulty.  LUE with ataxia>  dysmetria and decrease in motor control LLE.  Motor: RUE/RLE: 5/5 proximal to distal LUE/LLE: 4+/5 proximal to distal  Skin: Skin is warm and dry.  Psychiatric: She has a normal mood and affect. Her behavior is normal. Judgment and thought content normal.    Results for orders placed or performed during the hospital encounter of 09/04/16 (from the past 48 hour(s))  Basic metabolic panel     Status: Abnormal   Collection Time: 09/08/16  3:34 AM  Result Value Ref Range   Sodium 139 135 - 145 mmol/L   Potassium 3.1 (L) 3.5 - 5.1 mmol/L   Chloride 102 101 - 111 mmol/L   CO2 28 22 - 32 mmol/L   Glucose, Bld 134 (H) 65 - 99 mg/dL   BUN 15 6 - 20 mg/dL   Creatinine, Ser 0.54 0.44 - 1.00 mg/dL   Calcium 8.6 (L) 8.9 - 10.3 mg/dL   GFR calc non Af Amer >60 >60 mL/min   GFR calc Af Amer >60 >60 mL/min    Comment: (NOTE) The eGFR has been calculated using the CKD EPI equation. This calculation has not been validated in all clinical situations. eGFR's persistently <60 mL/min signify possible Chronic Kidney Disease.  Anion gap 9 5 - 15  Magnesium     Status: None   Collection Time: 09/08/16  3:34 AM  Result Value Ref Range   Magnesium 2.0 1.7 - 2.4 mg/dL   No results found.     Medical Problem List and Plan: 1.   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 2.  DVT Prophylaxis/Anticoagulation: Mechanical: Sequential compression devices, below knee Bilateral lower extremities and mobility.  3. Pain Management: hydrocodone prn effective.  4. Mood: LCSW to follow for evaluation and support.  5. Neuropsych: This patient is capable of making decisions on her own behalf. 6. Skin/Wound Care: Monitor incision for healing. Maintain adequate nutritional and hydration status.  7. Fluids/Electrolytes/Nutrition: Monitor I/O. Check lytes in am.  8. HTN: Monitor BP bid. Continue HCTZ and  metoprolol 9. Vestibular symptoms: Vestibular eval and treatment.  10. PAF: Monitor HR bid. Continue metoprolol. Will check baseline EKG as with frequent PVCs.  11. CAD s/p PTCA/stent: On low dose ASA, metoprolol and lipitor  12. GERD: Continue Protonix.  13. Hypokalemia: Improved after KCL runs. Multifactorial--D/C IVF. Add supplement as on diuretic. Encourage intake.  14. Impaired left eye closure: Eye drops qid and patch eye at bedtime.  14. Diarrhea: Discontinue colace    Post Admission Physician Evaluation: .Preadmission assessment reviewed and changes made below. Functional deficits secondary  to Balance deficits with dysphagia due to facial/vestibularcochlear nerve massextending into brachium pontis. 1. Patient is admitted to receive collaborative, interdisciplinary care between the physiatrist, rehab nursing staff, and therapy team. 2. Patient's level of medical complexity and substantial therapy needs in context of that medical necessity cannot be provided at a lesser intensity of care such as a SNF. 3. Patient has experienced substantial functional loss from his/her baseline which was documented above under the "Functional History" and "Functional Status" headings.  Judging by the patient's diagnosis, physical exam, and functional history, the patient has potential for functional progress which will result in measurable gains while on inpatient rehab.  These gains will be of substantial and practical use upon discharge  in facilitating mobility and self-care at the household level. 4. Physiatrist will provide 24 hour management of medical needs as well as oversight of the therapy plan/treatment and provide guidance as appropriate regarding the interaction of the two. 5. The Preadmission Screening has been reviewed and patient status is unchanged unless otherwise stated above. 6. 24 hour rehab nursing will assist with safety, disease management and patient education  and help integrate  therapy concepts, techniques,education, etc. 7. PT will assess and treat for/with: Lower extremity strength, range of motion, stamina, balance, functional mobility, safety, adaptive techniques and equipment, woundcare, coping skills, pain control, education.   Goals are: Mod I/Supervision. 8. OT will assess and treat for/with: ADL's, functional mobility, safety, upper extremity strength, adaptive techniques and equipment, wound mgt, ego support, and community reintegration.   Goals are: Mod I. Therapy may proceed with showering this patient. 9. SLP will assess and treat for/with: swallowing.  Goals are: Mod I. 10. Case Management and Social Worker will assess and treat for psychological issues and discharge planning. 11. Team conference will be held weekly to assess progress toward goals and to determine barriers to discharge. 12. Patient will receive at least 3 hours of therapy per day at least 5 days per week. 13. ELOS: 6-9 days.       14. Prognosis:  good  Delice Lesch, MD, Jonny Ruiz,  Ivan Anchors, PA-C 09/09/2016

## 2016-09-09 NOTE — Progress Notes (Signed)
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 Kathyrn Sheriff.    HPI: Kimberly Vaughn is a 71 y.o. 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 History taken from chart review and patient. Patient with CAD and MI requiring stent 03/2016  therefore surgery postponed with close observation. Follow up MRI showed marked enhancement of brainstem nodule with left facial numbness involving all three distributions of trigeminal nerve and concerns of malignancy and she was cleared for surgery by cards.  She was admitted on 09/04/16 for retrosigmoid craniectomy for tumor resection by Dr. Kathyrn Sheriff. Swallow evaluation done due to significant facial weakness and numbness--regular,thins with chin tuck recommended to prevent aspiration/penetration. Post-op MRI reviewed, showing partial resection of brainstem tumor. PT/OT evaluations done today showing significant balance deficits, dizziness/nausea with activity and ataxic gait. CIR recommended due to deficits in mobility and ability to carry out ADL tasks.    She was independent with cane PTA. She was still managing home, cooking and was driving till two weeks PTA.   Review of Systems  HENT: Positive for hearing loss (left ear). Negative for ear pain and tinnitus.   Eyes: Positive for blurred vision and double vision.  Cardiovascular: Negative for chest pain, palpitations and leg  swelling.  Gastrointestinal: Positive for nausea (with movement--ongoing). Negative for abdominal pain and heartburn.  Genitourinary: Negative for dysuria and urgency.  Musculoskeletal: Positive for back pain. Negative for myalgias.  Skin: Negative for itching and rash.  Neurological: Positive for dizziness (a little worse), sensory change, speech change and focal weakness. Negative for headaches.  Psychiatric/Behavioral: Negative for memory loss. The patient does not have insomnia.   All other systems reviewed and are negative.         Past Medical History:  Diagnosis Date  . A-fib ([place])   . Anginal pain ([place])   . Asthma    as a child  . Coronary artery disease   . DDD (degenerative disc disease), lumbar   . Degenerative joint disease   . Dizziness and giddiness   . Dysrhythmia    A fib  . Episodic atrial fibrillation ([place])   . Esophageal reflux   . GERD (gastroesophageal reflux disease)   . Hiatal hernia   . History of kidney stones   . Hypertension, essential   . Kidney stones   . Liver cyst LEFT  . Myocardial infarction ([place])   . Nodule of parotid gland   . Pneumonia   . PONV (postoperative nausea and vomiting)   . Pulmonary nodule   . PVC (premature ventricular contraction)    Nov 2017 & Feb 2018 noted on cardiac monitor worn at home         Past Surgical History:  Procedure Laterality Date  . APPLICATION OF CRANIAL NAVIGATION N/A 09/04/2016   Procedure: APPLICATION OF CRANIAL NAVIGATION;  Surgeon: Consuella Lose, MD;  Location: [facility];  Service: Neurosurgery;  Laterality: N/A;  . BREAST BIOPSY Bilateral   . CARPAL TUNNEL RELEASE Right 09/2013  .  CATARACTS Bilateral   . CERVICAL SPINE SURGERY  04/2012  . FINGER SURGERY Right    TRIGGER FINGER RELEASE  . PARTIAL THYMECTOMY  1973   pt states doesn't remember this being done  . RETROSIGMOID CRANIECTOMY FOR TUMOR RESECTION Left 09/04/2016   Procedure: RETROSIGMOID CRANIECTOMY  FOR TUMOR RESECTION WITH BRAINLAB NAVIGATION;  Surgeon: Consuella Lose, MD;  Location: [facility];  Service: Neurosurgery;  Laterality: Left;  CRANIOTOMY TUMOR EXCISION  . TONSILLECTOMY  1964  . VAGINAL HYSTERECTOMY  1974         Family History  Problem Relation Age of Onset  . Heart disease Mother 14  . Psoriasis Mother        PUSTULAR  . Heart attack Father 17  . Heart disease Father   . Lymphoma Sister   . Melanoma Brother   . Hypertension Daughter   . Lymphoma Other   . Osteoarthritis Other   . Melanoma Other   . Hypertension Other     Social History:  Married. Husband with medical issues and can provide supervision only. Retired three years ago. Per reports that she quit smoking about 10 years ago. Her smoking use included Cigarettes. She has never used smokeless tobacco. She reports that she does not drink alcohol or use drugs.        Allergies  Allergen Reactions  . No Known Allergies           Medications Prior to Admission  Medication Sig Dispense Refill  . aspirin EC 81 MG tablet Take 81 mg by mouth daily.    Marland Kitchen atorvastatin (LIPITOR) 80 MG tablet TAKE 1 TABLET BY MOUTH IN THE AFTERNOON  6  . BRILINTA 90 MG TABS tablet TAKE 1 TABLET BY MOUTH TWICE DAILY  6  . Cholecalciferol (VITAMIN D3) 2000 units TABS Take 2,000 Units by mouth daily.    . Cyanocobalamin (VITAMIN B-12) 5000 MCG SUBL Place 5,000 mcg under the tongue daily.     Marland Kitchen esomeprazole (NEXIUM) 40 MG capsule Take 40 mg by mouth daily.  12  . hydrochlorothiazide (HYDRODIURIL) 12.5 MG tablet TAKE 1 TABLET BY MOUTH EVERY MORNING  6  . meclizine (ANTIVERT) 25 MG tablet Take 25 mg by mouth 3 (three) times daily as needed for dizziness.    . metoprolol tartrate (LOPRESSOR) 25 MG tablet Take 25 mg by mouth 2 (two) times daily.     . nitroGLYCERIN (NITROSTAT) 0.4 MG SL tablet Place 0.4 mg under the tongue every 5 (five) minutes as needed for chest pain.      Home: Home  Living Family/patient expects to be discharged to:: Private residence Living Arrangements: Spouse/significant other Available Help at Discharge: Family, Available 24 hours/day Type of Home: Mobile home Home Access: Stairs to enter, Ramped entrance Technical brewer of Steps: 1 Home Layout: One level Bathroom Shower/Tub: Multimedia programmer: Standard Bathroom Accessibility: Yes Home Equipment: Shower seat, Radio producer - single point, Grab bars - tub/shower, Grab bars - toilet, Hand held shower head (walking stick in addition to cane)  Functional History: Prior Function Level of Independence: Independent with assistive device(s) Comments: Used the walking stick and furniture cruised for support Functional Status:  Mobility: Bed Mobility Overal bed mobility: Needs Assistance Bed Mobility: Supine to Sit Supine to sit: Min assist General bed mobility comments: Light assist provided for balance as pt anteriorly leaned and scooted forward to EOB. Increased time required. Pt with gagging upon achieving sit - reports she has been nauseated.  Transfers Overall transfer level:  Needs assistance Equipment used: Rolling walker (2 wheeled) Transfers: Sit to/from Stand Sit to Stand: Min assist, +2 physical assistance General transfer comment: Assist for power-up to full stand and to gain/maintain balance. VC's required for hand placement on seated surface for safety.  Ambulation/Gait Ambulation/Gait assistance: Min assist, +2 physical assistance, +2 safety/equipment Ambulation Distance (Feet): 15 Feet (7', 8') Assistive device: Rolling walker (2 wheeled) Gait Pattern/deviations: Step-through pattern, Decreased stride length, Trunk flexed, Antalgic General Gait Details: +2 assist required for balance support and safety. Therapists supported patient at the gait belt as well as supported the walker. Pt ambulated to the bathroom to void, and then washed hands at sink and ambulated to the  recliner chair.  Gait velocity: Decreased Gait velocity interpretation: Below normal speed for age/gender  ADL: ADL Overall ADL's : Needs assistance/impaired Eating/Feeding: Set up, Sitting, Minimal assistance Grooming: Standing, Moderate assistance Grooming Details (indicate cue type and reason): Mod assist to maintain balance.  Upper Body Bathing: Moderate assistance, Sitting Lower Body Bathing: Moderate assistance, Sit to/from stand Upper Body Dressing : Moderate assistance, Sitting Lower Body Dressing: Moderate assistance, Sit to/from stand Toilet Transfer: Moderate assistance, +2 for safety/equipment, RW Toilet Transfer Details (indicate cue type and reason): VC's for safety and use of RW.  Toileting- Clothing Manipulation and Hygiene: Moderate assistance, Sit to/from stand Toileting - Clothing Manipulation Details (indicate cue type and reason): Mod assist for safety and VC's for technique.  Functional mobility during ADLs: Moderate assistance, +2 for safety/equipment, Rolling walker General ADL Comments: Pt limited by decreased L sided coordination.   Cognition: Cognition Overall Cognitive Status: Within Functional Limits for tasks assessed Orientation Level: Oriented X4 Cognition Arousal/Alertness: Awake/alert Behavior During Therapy: WFL for tasks assessed/performed Overall Cognitive Status: Within Functional Limits for tasks assessed   Blood pressure 103/87, pulse 88, temperature 98.4 F (36.9 C), temperature source Oral, resp. rate 16, height 5\' 1"  (1.549 m), weight 84.2 kg (185 lb 10 oz), SpO2 95 %. Physical Exam  Nursing note and vitals reviewed. Constitutional: She is oriented to person, place, and time. She appears well-developed and well-nourished.  HENT:  Head: Normocephalic.  Mouth/Throat: Oropharynx is clear and moist.  Left crani incision clean, dry and intact.   Eyes: Conjunctivae are normal. Pupils are equal, round, and reactive to light.  Left eye  deviation  Neck: Normal range of motion. Neck supple.  Cardiovascular: Normal rate and regular rhythm.   Respiratory: Effort normal and breath sounds normal. No stridor. No respiratory distress. She has no wheezes.  GI: Soft. Bowel sounds are normal. She exhibits no distension. There is no tenderness.  Musculoskeletal: She exhibits no edema or tenderness.  Neurological: She is alert and oriented to person, place, and time.  Left facial weakness with numbness and hearing loss. Dysarthria.  Able to follow motor commands without difficulty.  RUE/RLE: 5/5 LUE/LLE 4+/5 LUE ataxia  Skin: Skin is warm and dry.  Psychiatric: She has a normal mood and affect. Her behavior is normal. Judgment and thought content normal.    Lab Results Last 24 Hours       Results for orders placed or performed during the hospital encounter of 09/04/16 (from the past 24 hour(s))  Magnesium     Status: None   Collection Time: 09/07/16  3:42 AM  Result Value Ref Range   Magnesium 1.9 1.7 - 2.4 mg/dL  Phosphorus     Status: Abnormal   Collection Time: 09/07/16  3:42 AM  Result Value Ref Range   Phosphorus 2.3 (  L) 2.5 - 4.6 mg/dL  Basic metabolic panel     Status: Abnormal   Collection Time: 09/07/16  3:42 AM  Result Value Ref Range   Sodium 139 135 - 145 mmol/L   Potassium 2.8 (L) 3.5 - 5.1 mmol/L   Chloride 101 101 - 111 mmol/L   CO2 30 22 - 32 mmol/L   Glucose, Bld 148 (H) 65 - 99 mg/dL   BUN 12 6 - 20 mg/dL   Creatinine, Ser 0.60 0.44 - 1.00 mg/dL   Calcium 8.6 (L) 8.9 - 10.3 mg/dL   GFR calc non Af Amer >60 >60 mL/min   GFR calc Af Amer >60 >60 mL/min   Anion gap 8 5 - 15      Imaging Results (Last 48 hours)  Dg Swallowing Func-speech Pathology  Result Date: 09/06/2016 Objective Swallowing Evaluation: Type of Study: MBS-Modified Barium Swallow Study Patient Details Name: Kimberly Vaughn MRN: [id number] Date of Birth: 28-Oct-1945 Today's Date: 09/06/2016 Time: SLP Start Time (ACUTE  ONLY): 1020-SLP Stop Time (ACUTE ONLY): 1050 SLP Time Calculation (min) (ACUTE ONLY): 30 min Past Medical History: Past Medical History: Diagnosis Date . A-fib ([place])  . Anginal pain ([place])  . Asthma   as a child . Coronary artery disease  . DDD (degenerative disc disease), lumbar  . Degenerative joint disease  . Dizziness and giddiness  . Dysrhythmia   A fib . Episodic atrial fibrillation ([place])  . Esophageal reflux  . GERD (gastroesophageal reflux disease)  . Hiatal hernia  . History of kidney stones  . Hypertension, essential  . Kidney stones  . Liver cyst LEFT . Myocardial infarction ([place])  . Nodule of parotid gland  . Pneumonia  . PONV (postoperative nausea and vomiting)  . Pulmonary nodule  Past Surgical History: Past Surgical History: Procedure Laterality Date . BREAST BIOPSY Bilateral  . CARPAL TUNNEL RELEASE Right 09/2013 . CATARACTS Bilateral  . CERVICAL SPINE SURGERY  04/2012 . FINGER SURGERY Right   TRIGGER FINGER RELEASE . PARTIAL THYMECTOMY  1973  pt states doesn't remember this being done . TONSILLECTOMY  1964 . VAGINAL HYSTERECTOMY  1974 HPI: Steele Ledonne Hamletis a 71 y.o.femaleI'm seeing for rapid progression of left-sided hearing loss and facial droop. She initially presented back in Nov with fairly rapid progression of hearing loss. She then developed rapid progression of facial droop in Jan. Initial MRI demonstrated enhancement of the this tibial cochlear nerve complex as well as the facial nerve primarily within the temporal bone. Repeat MRI a few months later in May, demonstrated increased enhancement of the nerve, as well as a very small nodular enhancement at the root entry zone in the brainstem. Unfortunately, she had had cardiac stent placement back in January, and was on aspirin and Brilinta. At that point, she presented to the outpatient neurosurgery clinic. Given the recent cardiac incident and her antiplatelet agents, we elected to proceed with close radiologic observation. About 6 weeks  later, I repeated the brain MRI which demonstrated marked enlargement of the enhancing nodule at the brainstem, now measuring more than 2 cm. With that new finding, certainly the concern was for some kind of malignancy. At this point, she had also started to complain of left-sided facial numbness including all 3 distributions of the trigeminal nerve. We therefore elected to proceed with surgery with a goal of diagnosis, and conceivably some reduction of mass effect. 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  Post op MRI is showing post op changes with subtotal resection of mass centered at the left cerebellopontine angle/brachium pontis.   Subjective: The patient was seen in radiology for determine current swallowing physiology.  Assessment / Plan / Recommendation CHL IP CLINICAL IMPRESSIONS 09/06/2016 Clinical Impression MBS was completed using thin liquids via tsp, cup and straw, nectar thick liquids via tsp, honey thick via tsp, pureed material and dual textured solids.  The patient was noted to have a mild oral and mild-moderate pharyngeal dysphagia.  The oral phase was characterized by delayed oral transit given dual textured solids that was most likely  related to lack of dentition, premature loss of the bolus given dual textured solids and mild oral residue across textures.  The pharyngeal phase was characterized by a swallow trigger in the pyriform sinuses for all liquid textures and a swallow trigger in the valleculae given pureed material and dual textured solids.  Silent penetration and aspiration to just the underside of the vocal cords occured prior to the swallow given all liquid textures via tsp sips.  Patient noted to make a "gaggy" noise with the penetration/aspiration but no true throat clear or cough was seen.  Volitional cough was noted to facilitate clearance of material from laryngeal area.   Use  of a chin tuck helped to prevent the penetration/aspiration given tsp, cup and single straw sips of thin liquids.   Esophageal sweep revealed it mildly slow to clear.  Patient also complained of nasal regurgitation.  Velopharyngeal closure appeared to be adequate during the MBS.  Recommend that the patient have a regular diet with thin liquids.  Patient MUST use a chin tuck for all sips of liquids and should take one sip at a time.  She may use straws as long as she takes one sip at a time.  The patient would benefit from ST follow up at next level of care to address dysphagia.  ST to follow during acute stay.   SLP Visit Diagnosis Dysphagia, oropharyngeal phase (R13.12) Attention and concentration deficit following -- Frontal lobe and executive function deficit following -- Impact on safety and function Mild aspiration risk   CHL IP TREATMENT RECOMMENDATION 09/06/2016 Treatment Recommendations Therapy as outlined in treatment plan below   Prognosis 09/06/2016 Prognosis for Safe Diet Advancement Good Barriers to Reach Goals -- Barriers/Prognosis Comment -- CHL IP DIET RECOMMENDATION 09/06/2016 SLP Diet Recommendations Regular solids;Thin liquid Liquid Administration via Cup;Straw Medication Administration Whole meds with puree Compensations Slow rate;Small sips/bites;Chin tuck;Use straw to facilitate chin tuck Postural Changes Seated upright at 90 degrees;Remain semi-upright after after feeds/meals (Comment)   CHL IP OTHER RECOMMENDATIONS 09/06/2016 Recommended Consults -- Oral Care Recommendations Oral care BID Other Recommendations --   CHL IP FOLLOW UP RECOMMENDATIONS 09/06/2016 Follow up Recommendations Home health SLP   CHL IP FREQUENCY AND DURATION 09/06/2016 Speech Therapy Frequency (ACUTE ONLY) min 2x/week Treatment Duration 2 weeks      CHL IP ORAL PHASE 09/06/2016 Oral Phase Impaired Oral - Pudding Teaspoon -- Oral - Pudding Cup -- Oral - Honey Teaspoon -- Oral - Honey Cup -- Oral - Nectar Teaspoon -- Oral - Nectar Cup  -- Oral - Nectar Straw -- Oral - Thin Teaspoon -- Oral - Thin Cup -- Oral - Thin Straw -- Oral - Puree -- Oral - Mech Soft -- Oral - Regular -- Oral - Multi-Consistency Delayed oral transit Oral - Pill -- Oral Phase - Comment --  CHL IP PHARYNGEAL PHASE 09/06/2016 Pharyngeal Phase Impaired Pharyngeal- Pudding  Teaspoon -- Pharyngeal -- Pharyngeal- Pudding Cup -- Pharyngeal -- Pharyngeal- Honey Teaspoon Delayed swallow initiation-pyriform sinuses;Penetration/Aspiration before swallow Pharyngeal Material enters airway, passes BELOW cords without attempt by patient to eject out (silent aspiration) Pharyngeal- Honey Cup -- Pharyngeal -- Pharyngeal- Nectar Teaspoon Delayed swallow initiation-pyriform sinuses;Penetration/Aspiration before swallow Pharyngeal Material enters airway, passes BELOW cords without attempt by patient to eject out (silent aspiration) Pharyngeal- Nectar Cup -- Pharyngeal -- Pharyngeal- Nectar Straw -- Pharyngeal -- Pharyngeal- Thin Teaspoon Delayed swallow initiation-pyriform sinuses;Penetration/Aspiration before swallow Pharyngeal Material enters airway, passes BELOW cords without attempt by patient to eject out (silent aspiration) Pharyngeal- Thin Cup Delayed swallow initiation-pyriform sinuses Pharyngeal -- Pharyngeal- Thin Straw Delayed swallow initiation-pyriform sinuses Pharyngeal -- Pharyngeal- Puree Delayed swallow initiation-vallecula Pharyngeal -- Pharyngeal- Mechanical Soft -- Pharyngeal -- Pharyngeal- Regular -- Pharyngeal -- Pharyngeal- Multi-consistency Delayed swallow initiation-vallecula Pharyngeal -- Pharyngeal- Pill -- Pharyngeal -- Pharyngeal Comment --  CHL IP CERVICAL ESOPHAGEAL PHASE 09/06/2016 Cervical Esophageal Phase WFL Pudding Teaspoon -- Pudding Cup -- Honey Teaspoon -- Honey Cup -- Nectar Teaspoon -- Nectar Cup -- Nectar Straw -- Thin Teaspoon -- Thin Cup -- Thin Straw -- Puree -- Mechanical Soft -- Regular -- Multi-consistency -- Pill -- Cervical Esophageal Comment -- CHL  IP GO 09/06/2016 Functional Assessment Tool Used (None) Functional Limitations Swallowing Swallow Current Status (T2458) (None) Swallow Goal Status (K9983) (None) Swallow Discharge Status (J8250) (None) Motor Speech Current Status (N3976) (None) Motor Speech Goal Status (B3419) (None) Motor Speech Goal Status (F7902) (None) Spoken Language Comprehension Current Status (I0973) (None) Spoken Language Comprehension Goal Status (Z3299) (None) Spoken Language Comprehension Discharge Status (M4268) (None) Spoken Language Expression Current Status (T4196) (None) Spoken Language Expression Goal Status (Q2297) (None) Spoken Language Expression Discharge Status [phone]) (None) Attention Current Status (J9417) (None) Attention Goal Status (E0814) (None) Attention Discharge Status (G8185) (None) Memory Current Status (U3149) (None) Memory Goal Status (F0263) (None) Memory Discharge Status (Z8588) (None) Voice Current Status (F0277) (None) Voice Goal Status (A1287) (None) Voice Discharge Status (O6767) (None) Other Speech-Language Pathology Functional Limitation Current Status (M0947) (None) Other Speech-Language Pathology Functional Limitation Goal Status (S9628) (None) Other Speech-Language Pathology Functional Limitation Discharge Status [phone]) (None) Shelly Flatten, MA, CCC-SLP Acute Rehab SLP [phone] Lamar Sprinkles 09/06/2016, 11:21 AM                Assessment/Plan: Diagnosis: Facial/vestibularcochlear nerve mass extending into brachium pontis Labs and images independently reviewed.  Records reviewed and summated above.  1. Does the need for close, 24 hr/day medical supervision in concert with the patient's rehab needs make it unreasonable for this patient to be served in a less intensive setting? Potentially  2. Co-Morbidities requiring supervision/potential complications: CAD and MI (cont meds), hypokalemia (continue to monitor and replete as necessary), ABLA (transfuse if necessary to ensure appropriate  perfusion for increased activity tolerance), post-op pain (Biofeedback training with therapies to help reduce reliance on opiate pain medications, monitor pain control during therapies, and sedation at rest and titrate to maximum efficacy to ensure participation and gains in therapies) 3. Due to safety, disease management, pain management and patient education, does the patient require 24 hr/day rehab nursing? Yes 4. Does the patient require coordinated care of a physician, rehab nurse, PT (1-2 hrs/day, 5 days/week) and OT (1-2 hrs/day, 5 days/week) to address physical and functional deficits in the context of the above medical diagnosis(es)? Yes Addressing deficits in the following areas: balance, endurance, locomotion, strength, transferring, bathing, dressing, toileting and psychosocial support 5. Can the patient actively participate  in an intensive therapy program of at least 3 hrs of therapy per day at least 5 days per week? Yes 6. The potential for patient to make measurable gains while on inpatient rehab is excellent 7. Anticipated functional outcomes upon discharge from inpatient rehab are modified independent  with PT, modified independent with OT, n/a with SLP. 8. Estimated rehab length of stay to reach the above functional goals is: 7-10 days. 9. Anticipated D/C setting: Home 10. Anticipated post D/C treatments: HH therapy and Home excercise program 11. Overall Rehab/Functional Prognosis: good  RECOMMENDATIONS: This patient's condition is appropriate for continued rehabilitative care in the following setting: CIR Patient has agreed to participate in recommended program. Yes Note that insurance prior authorization may be required for reimbursement for recommended care.  Comment: Rehab Admissions Coordinator to follow up.  Delice Lesch, MD, [place], [place] 09/07/2016    Revision History                        Routing History

## 2016-09-10 ENCOUNTER — Encounter (HOSPITAL_COMMUNITY): Payer: Self-pay

## 2016-09-10 ENCOUNTER — Inpatient Hospital Stay (HOSPITAL_COMMUNITY): Payer: Medicare Other | Admitting: Speech Pathology

## 2016-09-10 ENCOUNTER — Inpatient Hospital Stay (HOSPITAL_COMMUNITY): Payer: Medicare Other | Admitting: Physical Therapy

## 2016-09-10 ENCOUNTER — Inpatient Hospital Stay (HOSPITAL_COMMUNITY): Payer: Medicare Other | Admitting: Occupational Therapy

## 2016-09-10 DIAGNOSIS — E876 Hypokalemia: Secondary | ICD-10-CM

## 2016-09-10 DIAGNOSIS — G939 Disorder of brain, unspecified: Secondary | ICD-10-CM

## 2016-09-10 DIAGNOSIS — I493 Ventricular premature depolarization: Secondary | ICD-10-CM

## 2016-09-10 DIAGNOSIS — R1310 Dysphagia, unspecified: Secondary | ICD-10-CM

## 2016-09-10 DIAGNOSIS — D72828 Other elevated white blood cell count: Secondary | ICD-10-CM

## 2016-09-10 DIAGNOSIS — D72829 Elevated white blood cell count, unspecified: Secondary | ICD-10-CM

## 2016-09-10 DIAGNOSIS — I480 Paroxysmal atrial fibrillation: Secondary | ICD-10-CM

## 2016-09-10 DIAGNOSIS — D62 Acute posthemorrhagic anemia: Secondary | ICD-10-CM

## 2016-09-10 DIAGNOSIS — I10 Essential (primary) hypertension: Secondary | ICD-10-CM

## 2016-09-10 DIAGNOSIS — E46 Unspecified protein-calorie malnutrition: Secondary | ICD-10-CM

## 2016-09-10 LAB — COMPREHENSIVE METABOLIC PANEL
ALBUMIN: 3 g/dL — AB (ref 3.5–5.0)
ALT: 24 U/L (ref 14–54)
AST: 19 U/L (ref 15–41)
Alkaline Phosphatase: 57 U/L (ref 38–126)
Anion gap: 8 (ref 5–15)
BUN: 19 mg/dL (ref 6–20)
CHLORIDE: 102 mmol/L (ref 101–111)
CO2: 26 mmol/L (ref 22–32)
Calcium: 8.5 mg/dL — ABNORMAL LOW (ref 8.9–10.3)
Creatinine, Ser: 0.69 mg/dL (ref 0.44–1.00)
GFR calc Af Amer: >60 mL/min (ref >60)
GFR calc non Af Amer: >60 mL/min (ref >60)
GLUCOSE: 134 mg/dL — AB (ref 65–99)
POTASSIUM: 3.3 mmol/L — AB (ref 3.5–5.1)
Sodium: 136 mmol/L (ref 135–145)
Total Bilirubin: 0.8 mg/dL (ref 0.3–1.2)
Total Protein: 4.9 g/dL — ABNORMAL LOW (ref 6.5–8.1)

## 2016-09-10 LAB — CBC WITH DIFFERENTIAL/PLATELET
BASOS ABS: 0 10*3/uL (ref 0.0–0.1)
BASOS PCT: 0 %
Eosinophils Absolute: 0 10*3/uL (ref 0.0–0.7)
Eosinophils Relative: 0 %
HEMATOCRIT: 32.6 % — AB (ref 36.0–46.0)
Hemoglobin: 11.2 g/dL — ABNORMAL LOW (ref 12.0–15.0)
Lymphocytes Relative: 20 %
Lymphs Abs: 2.4 10*3/uL (ref 0.7–4.0)
MCH: 33 pg (ref 26.0–34.0)
MCHC: 34.4 g/dL (ref 30.0–36.0)
MCV: 96.2 fL (ref 78.0–100.0)
MONO ABS: 0.5 10*3/uL (ref 0.1–1.0)
Monocytes Relative: 4 %
NEUTROS ABS: 8.8 10*3/uL — AB (ref 1.7–7.7)
Neutrophils Relative %: 76 %
PLATELETS: 240 10*3/uL (ref 150–400)
RBC: 3.39 MIL/uL — AB (ref 3.87–5.11)
RDW: 13.2 % (ref 11.5–15.5)
WBC: 11.6 10*3/uL — AB (ref 4.0–10.5)

## 2016-09-10 MED ORDER — DEXAMETHASONE 4 MG PO TABS
4.0000 mg | ORAL_TABLET | Freq: Three times a day (TID) | ORAL | Status: DC
  Administered 2016-09-10 – 2016-09-14 (×13): 4 mg via ORAL
  Filled 2016-09-10 (×13): qty 1

## 2016-09-10 MED ORDER — PRO-STAT SUGAR FREE PO LIQD: 30.0000 mL | Freq: Two times a day (BID) | ORAL | Status: DC

## 2016-09-10 NOTE — Progress Notes (Signed)
Social Work  Social Work Assessment and Plan  Patient Details  Name: Kimberly Vaughn MRN: [id number] Date of Birth: 1945/04/15  Today's Date: 09/10/2016  Problem List:  Patient Active Problem List   Diagnosis Date Noted  . Dysphagia   . PVC (premature ventricular contraction)   . Hypoalbuminemia due to protein-calorie malnutrition ([place])   . Leucocytosis   . Brain mass 09/09/2016  . Ataxia   . Benign essential HTN   . PAF (paroxysmal atrial fibrillation) ([place])   . Gastroesophageal reflux disease   . Diarrhea   . History of MI (myocardial infarction)   . Acute blood loss anemia   . Hypokalemia   . Post-operative pain   . Mass of brain 09/04/2016  . Brain tumor ([place]) 09/04/2016  . Coronary artery disease 09/01/2016  . Paroxysmal atrial fibrillation ([place]) 09/01/2016  . Essential hypertension 09/01/2016  . BPPV (benign paroxysmal positional vertigo) 12/25/2015   Past Medical History:  Past Medical History:  Diagnosis Date  . A-fib ([place])   . Anginal pain ([place])   . Asthma    as a child  . Coronary artery disease   . DDD (degenerative disc disease), lumbar   . Degenerative joint disease   . Dizziness and giddiness   . Dysrhythmia    A fib  . Episodic atrial fibrillation ([place])   . Esophageal reflux   . GERD (gastroesophageal reflux disease)   . Hiatal hernia   . History of kidney stones   . Hypertension, essential   . Kidney stones   . Liver cyst LEFT  . Myocardial infarction ([place])   . Nodule of parotid gland   . Pneumonia   . PONV (postoperative nausea and vomiting)   . Pulmonary nodule   . PVC (premature ventricular contraction)    Nov 2017 & Feb 2018 noted on cardiac monitor worn at home   Past Surgical History:  Past Surgical History:  Procedure Laterality Date  . APPLICATION OF CRANIAL NAVIGATION N/A 09/04/2016   Procedure: APPLICATION OF CRANIAL NAVIGATION;  Surgeon: Consuella Lose, MD;  Location: [place];  Service: Neurosurgery;  Laterality: N/A;  . BRAIN  SURGERY  09/04/2016  . BREAST BIOPSY Bilateral   . CARPAL TUNNEL RELEASE Right 09/2013  . CATARACTS Bilateral   . CERVICAL SPINE SURGERY  04/2012  . FINGER SURGERY Right    TRIGGER FINGER RELEASE  . PARTIAL THYMECTOMY  1973   pt states doesn't remember this being done  . RETROSIGMOID CRANIECTOMY FOR TUMOR RESECTION Left 09/04/2016   Procedure: RETROSIGMOID CRANIECTOMY FOR TUMOR RESECTION WITH BRAINLAB NAVIGATION;  Surgeon: Consuella Lose, MD;  Location: [place];  Service: Neurosurgery;  Laterality: Left;  CRANIOTOMY TUMOR EXCISION  . TONSILLECTOMY  1964  . VAGINAL HYSTERECTOMY  1974   Social History:  reports that she quit smoking about 10 years ago. Her smoking use included Cigarettes. She has never used smokeless tobacco. She reports that she does not drink alcohol or use drugs.  Family / Support Systems Marital Status: Married How Long?: 40+ yrs Patient Roles: Spouse, Parent Spouse/Significant Other: [place] @ [phone] Children: daughter, Venetia Night @ [phone] Other Supports: adult grandchildren and extended family all living in the immediate area  Anticipated Caregiver: Husband will be there, however, pt does not really want him to assist her;  Jeani Hawking to be primary support along with intermittent support of extended family and  friends  Ability/Limitations of Caregiver: Pt really does not wish to have husband assist but he is in  the home.  Caregiver Availability: 24/7 Family Dynamics: Pt describes many years of "just living in the same house" with her husband.  Notes, "I think he would help me but I don't really want to let him."  Notes her daughter and other family are all very supportive and becomes tearful as she talks about her grand and great-grand children.  Social History Preferred language: English Religion: Methodist Cultural Background: NA Read: Yes Write: Yes Employment Status: Retired Freight forwarder Issues: None Guardian/Conservator: None -  per MD, pt is capable of making decisions on her own behalf - daughter, Jeani Hawking is her POA   Abuse/Neglect Physical Abuse: Denies Verbal Abuse: Denies Sexual Abuse: Denies Exploitation of patient/patient's resources: Denies Self-Neglect: Denies  Emotional Status Pt's affect, behavior adn adjustment status: Pt very pleasant and able to complete assessment interivew whithout any difficulty.  She does become a little tearful as she talks about her family.  She reports that her mood has "been pretty good I think!Marland Kitchen..just hoping for good news soon."  Will monitor and refer for neuropsychology as indicated. Recent Psychosocial Issues: Card stints placed Jan 2018;   Pyschiatric History: None Substance Abuse History: None  Patient / Family Perceptions, Expectations & Goals Pt/Family understanding of illness & functional limitations: Pt able to provide lengthy, detailed report of her medical diagnosis and treatment course so far.  Notes that she is hoping "just some radiation will take care of the rest of it (tumor)." Premorbid pt/family roles/activities: Pt was completely independent Anticipated changes in roles/activities/participation: Little change if pt able to reach mod ind goals - daughter to be primary support person. Pt/family expectations/goals: Pt hopeful for good path report and good progress with therapies  [employer]: None Premorbid Home Care/DME Agencies: None Transportation available at discharge: yes Resource referrals recommended: Neuropsychology  Discharge Planning Living Arrangements: Spouse/significant other Support Systems: Spouse/significant other, Children, Other relatives, Water engineer, Social worker community Type of Residence: Private residence Insurance Resources: [employer] ([place]) Financial Resources: [place] Referred: No Living Expenses: Own Money Management: Patient Does the patient have any problems  obtaining your medications?: No Home Management: pt Patient/Family Preliminary Plans: pt to return home with spouse but plans to rely on daughter and other family members for any support. Expected length of stay: 7-10 days   Clinical Impression Very pleasant woman here following surgery for tumor removal.  Motivated for therapies and anxiously awaiting path report to then know plan for further treatment.  Notes very good support from family and denies any significant emotional distress.  Will follow for support and d/c planning needs.  Mira Balon 09/10/2016, 2:11 PM

## 2016-09-10 NOTE — IPOC Note (Signed)
Overall Plan of Care [facility]) Patient Details Name: Kimberly Vaughn MRN: [id number] DOB: [birth year]  Admitting Diagnosis: Brain Tumor Resection  Hospital Problems: Active Problems:   Brain mass   Dysphagia   PVC (premature ventricular contraction)   Hypoalbuminemia due to protein-calorie malnutrition ([place])   Leucocytosis     Functional Problem List: Nursing Edema, Endurance, Medication Management, Nutrition, Perception, Safety, Skin Integrity  PT Balance, Motor, Endurance, Safety, Sensory  OT Balance, Endurance, Motor, Perception, Sensory, Skin Integrity, Vision  SLP Nutrition  TR         Basic ADL's: OT Eating, Grooming, Bathing, Dressing, Toileting     Advanced  ADL's: OT Simple Meal Preparation, Light Housekeeping     Transfers: PT Bed Mobility, Bed to Chair, Musician, Manufacturing systems engineer, Metallurgist: PT Ambulation, Stairs     Additional Impairments: OT Fuctional Use of Upper Extremity  SLP Swallowing, Communication expression    TR      Anticipated Outcomes Item Anticipated Outcome  Self Feeding Mod I  Swallowing  mod I with least restrictive diet    Basic self-care  Mod I  Toileting  Mod i   Bathroom Transfers Mod I - Supervision  Bowel/Bladder  Supervision with bowel and bladder  Transfers  mod I  Locomotion  mod I with gait  Communication  mod I   Cognition     Pain  <3 on a 0-10 pain scale  Safety/Judgment  min assist   Therapy Plan: PT Intensity: Minimum of 1-2 x/day ,45 to 90 minutes PT Frequency: 5 out of 7 days PT Duration Estimated Length of Stay: 10-12 days OT Intensity: Minimum of 1-2 x/day, 45 to 90 minutes OT Frequency: 5 out of 7 days OT Duration/Estimated Length of Stay: 7-10 days SLP Intensity: Minumum of 1-2 x/day, 30 to 90 minutes SLP Frequency: 1 to 3 out of 7 days SLP Duration/Estimated Length of Stay: 7-10 days        Team Interventions: Nursing Interventions Patient/Family Education, Disease  Management/Prevention, Medication Management, Skin Care/Wound Management, Discharge Planning, Psychosocial Support  PT interventions Ambulation/gait training, Discharge planning, Training and development officer, Community reintegration, Technical sales engineer stimulation, Neuromuscular re-education, Patient/family education, IT trainer, Therapeutic Exercise, UE/LE Coordination activities, Wheelchair propulsion/positioning, UE/LE Strength taining/ROM, Therapeutic Activities, Splinting/orthotics, Pain management, Functional mobility training, DME/adaptive equipment instruction  OT Interventions Training and development officer, Community reintegration, Discharge planning, Disease mangement/prevention, DME/adaptive equipment instruction, Functional mobility training, Neuromuscular re-education, Pain management, Patient/family education, Psychosocial support, Self Care/advanced ADL retraining, Skin care/wound managment, Therapeutic Activities, Therapeutic Exercise, UE/LE Coordination activities, Visual/perceptual remediation/compensation  SLP Interventions Cueing hierarchy, Dysphagia/aspiration precaution training, Speech/Language facilitation, Patient/family education, Environmental controls  TR Interventions    SW/CM Interventions Discharge Planning, Psychosocial Support, Patient/Family Education    Team Discharge Planning: Destination: PT-Home ,OT- Home , SLP-Home Projected Follow-up: PT-Home health PT, Outpatient PT, OT-  Home health OT, Outpatient OT, SLP-Outpatient SLP Projected Equipment Needs: PT-To be determined, OT- To be determined, SLP-None recommended by SLP Equipment Details: PT- , OT-  Patient/family involved in discharge planning: PT- Patient,  OT-Patient, SLP-Patient  MD ELOS: 6-9 days. Medical Rehab Prognosis:  Good Assessment: 71 y.o. 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 Patient  with CAD and MI requiring stent 03/2016  therefore surgery postponed with close observation. Follow up MRI showed marked enhancement of brainstem nodule with left facial numbness involving all three distributions of trigeminal nerve and concerns  of malignancy and she was cleared for surgery by cards.  She was admitted on 09/04/16 for retrosigmoid craniectomy for tumor resection by Dr. Kathyrn Sheriff. Swallow evaluation done due to significant facial weakness and numbness, thins with chin tuck recommended to prevent aspiration/penetration. Post-op MRI reviewed, showing partial resection of brainstem tumor. Therapy ongoing and patient limited by left visual deficits, balance deficits with ataxic gait and issues with intermittent dizziness/nausea with activity.  Will set goals for Mod I with PT/OT.  See Team Conference Notes for weekly updates to the plan of care

## 2016-09-10 NOTE — Evaluation (Signed)
Physical Therapy Assessment and Plan  Patient Details  Name: Kimberly Vaughn MRN: [id number] Date of Birth: [birth year]  PT Diagnosis: Abnormality of gait, Difficulty walking, Impaired sensation and Muscle weakness Rehab Potential: Good ELOS: 10-12 days   Today's Date: 09/10/2016 PT Individual Time: 1100-1155 PT Individual Time Calculation (min): 55 min    Problem List:  Patient Active Problem List   Diagnosis Date Noted  . Dysphagia   . PVC (premature ventricular contraction)   . Hypoalbuminemia due to protein-calorie malnutrition ([place])   . Leucocytosis   . Brain mass 09/09/2016  . Ataxia   . Benign essential HTN   . PAF (paroxysmal atrial fibrillation) ([place])   . Gastroesophageal reflux disease   . Diarrhea   . History of MI (myocardial infarction)   . Acute blood loss anemia   . Hypokalemia   . Post-operative pain   . Mass of brain 09/04/2016  . Brain tumor ([place]) 09/04/2016  . Coronary artery disease 09/01/2016  . Paroxysmal atrial fibrillation ([place]) 09/01/2016  . Essential hypertension 09/01/2016  . BPPV (benign paroxysmal positional vertigo) 12/25/2015    Past Medical History:  Past Medical History:  Diagnosis Date  . A-fib ([place])   . Anginal pain ([place])   . Asthma    as a child  . Coronary artery disease   . DDD (degenerative disc disease), lumbar   . Degenerative joint disease   . Dizziness and giddiness   . Dysrhythmia    A fib  . Episodic atrial fibrillation ([place])   . Esophageal reflux   . GERD (gastroesophageal reflux disease)   . Hiatal hernia   . History of kidney stones   . Hypertension, essential   . Kidney stones   . Liver cyst LEFT  . Myocardial infarction ([place])   . Nodule of parotid gland   . Pneumonia   . PONV (postoperative nausea and vomiting)   . Pulmonary nodule   . PVC (premature ventricular contraction)    Nov 2017 & Feb 2018 noted on cardiac monitor worn at home   Past Surgical History:  Past Surgical History:  Procedure  Laterality Date  . APPLICATION OF CRANIAL NAVIGATION N/A 09/04/2016   Procedure: APPLICATION OF CRANIAL NAVIGATION;  Surgeon: Consuella Lose, MD;  Location: [place];  Service: Neurosurgery;  Laterality: N/A;  . BRAIN SURGERY  09/04/2016  . BREAST BIOPSY Bilateral   . CARPAL TUNNEL RELEASE Right 09/2013  . CATARACTS Bilateral   . CERVICAL SPINE SURGERY  04/2012  . FINGER SURGERY Right    TRIGGER FINGER RELEASE  . PARTIAL THYMECTOMY  1973   pt states doesn't remember this being done  . RETROSIGMOID CRANIECTOMY FOR TUMOR RESECTION Left 09/04/2016   Procedure: RETROSIGMOID CRANIECTOMY FOR TUMOR RESECTION WITH BRAINLAB NAVIGATION;  Surgeon: Consuella Lose, MD;  Location: [place];  Service: Neurosurgery;  Laterality: Left;  CRANIOTOMY TUMOR EXCISION  . TONSILLECTOMY  1964  . VAGINAL HYSTERECTOMY  1974    Assessment & Plan Clinical Impression: Patient is a 71 y.o. year old female with progressive left hearing loss and facial droop since 01/2016 with diagnosis of cochlear nerve complex affecitng seventh and eight nerve. History taken from chart review and patient. Patient with CAD and MI requiring stent 03/2016 therefore surgery postponed with close observation. Follow up MRI showed marked enhancement of brainstem nodule with left facial numbness involving all three distributions of trigeminal nerve and concerns of malignancy and she was cleared for surgery by cards. She was admitted on 09/04/16 for retrosigmoid  craniectomy for tumor resection .  Patient transferred to CIR on 09/09/2016 .   Patient currently requires mod with mobility secondary to muscle weakness, decreased cardiorespiratoy endurance and decreased standing balance and decreased balance strategies.  Prior to hospitalization, patient was modified independent  with mobility and lived with Spouse in a Mobile home home.  Home access is ramped entrance to door with approx 6" step into house.  One step at carportRamped entrance, Stairs to  enter.  Patient will benefit from skilled PT intervention to maximize safe functional mobility, minimize fall risk and decrease caregiver burden for planned discharge home with intermittent assist.  Anticipate patient will benefit from follow up OP at discharge.  PT - End of Session Activity Tolerance: Tolerates 30+ min activity with multiple rests Endurance Deficit: Yes Endurance Deficit Description: seated rest breaks during session PT Assessment Rehab Potential (ACUTE/IP ONLY): Good PT Barriers to Discharge: Pending chemo/radiation PT Barriers to Discharge Comments: pt states she is to begin radiation soon PT Patient demonstrates impairments in the following area(s): Balance;Motor;Endurance;Safety;Sensory PT Transfers Functional Problem(s): Bed Mobility;Bed to Chair;Car;Furniture PT Locomotion Functional Problem(s): Ambulation;Stairs PT Plan PT Intensity: Minimum of 1-2 x/day ,45 to 90 minutes PT Frequency: 5 out of 7 days PT Treatment/Interventions: Ambulation/gait training;Discharge planning;Balance/vestibular training;Community reintegration;Functional electrical stimulation;Neuromuscular re-education;Patient/family education;Stair training;Therapeutic Exercise;UE/LE Coordination activities;Wheelchair propulsion/positioning;UE/LE Strength taining/ROM;Therapeutic Activities;Splinting/orthotics;Pain management;Functional mobility training;DME/adaptive equipment instruction PT Transfers Anticipated Outcome(s): mod I PT Locomotion Anticipated Outcome(s): mod I with gait PT Recommendation Recommendations for Other Services: Vestibular eval Follow Up Recommendations: Home health PT;Outpatient PT Patient destination: Home Equipment Recommended: To be determined  Skilled Therapeutic Intervention Pt participated in skilled PT eval and was educated on PT POC and goals.  Pt performed gait without AD with 1 HHA with mod/max A due to Lt LE weakness in stance phase of gait and LOB to Lt.  Gait  with RW 50' and 25' with min A due to continued Lt lean.  Ramp negotiation with RW with min A.  Simulated sedan transfer with min A for turning and cues for safety.  Step ups for Lt LE strengthening 3 x 10 with bilat UE support with min A.  Side stepping with RW with focus on strengthening Lt hip with pt able to perform with min A.    PT Evaluation Precautions/Restrictions Precautions Precautions: Fall Precaution Comments: Does not hear out of L ear; L eye with blurred vision. (but improving) Restrictions Weight Bearing Restrictions: No Pain Pain Assessment Pain Assessment: No/denies pain Home Living/Prior Functioning Home Living Living Arrangements: Spouse/significant other Available Help at Discharge: Family;Available 24 hours/day;Friend(s) Type of Home: Mobile home Home Access: Ramped entrance;Stairs to enter Entrance Stairs-Number of Steps: ramped entrance to door with approx 6" step into house.  One step at [employer]: None Home Layout: One level  Lives With: Spouse Prior Function Level of Independence: Independent with basic ADLs;Independent with homemaking with ambulation;Independent with gait  Able to Take Stairs?: Yes Driving: Yes Vocation: Retired Comments: Used the walking stick and furniture cruised for support prior to admission   Cognition Overall Cognitive Status: Within Functional Limits for tasks assessed Arousal/Alertness: Awake/alert Attention: Selective Selective Attention: Appears intact Safety/Judgment: Appears intact Sensation Sensation Light Touch:  (absent Lt side of face) Proprioception: Appears Intact Coordination Gross Motor Movements are Fluid and Coordinated: No Coordination and Movement Description: Lt ataxia, dysmetria Finger Nose Finger Test: LUE ataxia and dysmetria.  Overshooting Motor  Motor Motor: Hemiplegia;Abnormal postural alignment and control Motor - Skilled Clinical Observations: Lt LE weakness,  Lt lean in  standing   Trunk/Postural Assessment  Cervical Assessment Cervical Assessment: Within Functional Limits Thoracic Assessment Thoracic Assessment: Within Functional Limits Lumbar Assessment Lumbar Assessment:  (posterior pelvic tilt) Postural Control Postural Control: Deficits on evaluation Righting Reactions: delayed  Balance Static Standing Balance Static Standing - Comment/# of Minutes: mod A standing without UE support due to Lt lean Dynamic Standing Balance Dynamic Standing - Comments: mod/max A for gait without AD due to LOB to left Extremity Assessment  RUE Assessment RUE Assessment: Within Functional Limits LUE Assessment LUE Assessment: Within Functional Limits (ROM WFL, strength grossly 4+/5) RLE Assessment RLE Assessment: Within Functional Limits LLE Assessment LLE Assessment:  (grossly 3/5, funcitonal weakness in Lt hip during gait and stance)   See Function Navigator for Current Functional Status.   Refer to Care Plan for Long Term Goals  Recommendations for other services: None   Discharge Criteria: Patient will be discharged from PT if patient refuses treatment 3 consecutive times without medical reason, if treatment goals not met, if there is a change in medical status, if patient makes no progress towards goals or if patient is discharged from hospital.  The above assessment, treatment plan, treatment alternatives and goals were discussed and mutually agreed upon: by patient  DONAWERTH,KAREN 09/10/2016, 2:10 PM

## 2016-09-10 NOTE — Evaluation (Signed)
Speech Language Pathology Assessment and Plan  Patient Details  Name: Kimberly Vaughn MRN: [id number] Date of Birth: 04-Jun-1945  SLP Diagnosis: Dysphagia;Dysarthria  Rehab Potential: Good ELOS:  7-10 days   Today's Date: 09/10/2016 SLP Individual Time: 0803-0900 SLP Individual Time Calculation (min): 57 min   Problem List:  Patient Active Problem List   Diagnosis Date Noted  . Dysphagia   . PVC (premature ventricular contraction)   . Hypoalbuminemia due to protein-calorie malnutrition (HCC)   . Leucocytosis   . Brain mass 09/09/2016  . Ataxia   . Benign essential HTN   . PAF (paroxysmal atrial fibrillation) (HCC)   . Gastroesophageal reflux disease   . Diarrhea   . History of MI (myocardial infarction)   . Acute blood loss anemia   . Hypokalemia   . Post-operative pain   . Mass of brain 09/04/2016  . Brain tumor (HCC) 09/04/2016  . Coronary artery disease 09/01/2016  . Paroxysmal atrial fibrillation (HCC) 09/01/2016  . Essential hypertension 09/01/2016  . BPPV (benign paroxysmal positional vertigo) 12/25/2015   Past Medical History:  Past Medical History:  Diagnosis Date  . A-fib (HCC)   . Anginal pain (HCC)   . Asthma    as a child  . Coronary artery disease   . DDD (degenerative disc disease), lumbar   . Degenerative joint disease   . Dizziness and giddiness   . Dysrhythmia    A fib  . Episodic atrial fibrillation (HCC)   . Esophageal reflux   . GERD (gastroesophageal reflux disease)   . Hiatal hernia   . History of kidney stones   . Hypertension, essential   . Kidney stones   . Liver cyst LEFT  . Myocardial infarction (HCC)   . Nodule of parotid gland   . Pneumonia   . PONV (postoperative nausea and vomiting)   . Pulmonary nodule   . PVC (premature ventricular contraction)    Nov 2017 & Feb 2018 noted on cardiac monitor worn at home   Past Surgical History:  Past Surgical History:  Procedure Laterality Date  . APPLICATION OF CRANIAL NAVIGATION  N/A 09/04/2016   Procedure: APPLICATION OF CRANIAL NAVIGATION;  Surgeon: Lisbeth Renshaw, MD;  Location: [facility] OR;  Service: Neurosurgery;  Laterality: N/A;  . BRAIN SURGERY  09/04/2016  . BREAST BIOPSY Bilateral   . CARPAL TUNNEL RELEASE Right 09/2013  . CATARACTS Bilateral   . CERVICAL SPINE SURGERY  04/2012  . FINGER SURGERY Right    TRIGGER FINGER RELEASE  . PARTIAL THYMECTOMY  1973   pt states doesn't remember this being done  . RETROSIGMOID CRANIECTOMY FOR TUMOR RESECTION Left 09/04/2016   Procedure: RETROSIGMOID CRANIECTOMY FOR TUMOR RESECTION WITH BRAINLAB NAVIGATION;  Surgeon: Lisbeth Renshaw, MD;  Location: [facility] OR;  Service: Neurosurgery;  Laterality: Left;  CRANIOTOMY TUMOR EXCISION  . TONSILLECTOMY  1964  . 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 History taken from chart review and patient. Patient with CAD and MI requiring stent 73/2018 therefore surgery postponed with close observation. Follow up MRI showed marked enhancement of brainstem nodule with left facial numbness involving all three distributions of trigeminal nerve and concerns of malignancy and she was cleared for surgery by cards. She was admitted on 09/04/16 for retrosigmoid craniectomy for tumor resection by Dr. Conchita Paris.  Swallow evaluation done due to significant facial weakness and numbness--regular,thins with chin tuck recommended to prevent aspiration/penetration. Post-op MRI reviewed, showing partial resection of brainstem tumor. Therapy ongoing and patient limited by left visual deficits, balance deficits with ataxic gait and issues with intermittent dizziness/nausea with activity.  CIR recommended due to deficits in mobility and ability to carry out ADL tasks.  SLP evaluation was completed on  09/10/2016 with the following results:  Pt presents with moderate left sided oral motor weakness which impacts her ability to contain, masticate, and transit solid boluses and results in anterior labial loss and pocketing of materials.  Pt was able to utilize lingual and manual assist to clear pocketed solids from the oral cavity with mod I.  She was also able to utilize a chin tuck with thin liquids via straw with mod I and no overt s/s of aspiration; although pt exhibited consistent eructation after every sip of liquids.  Recommend that pt remain on dys 3 solids with thin liquids and intermittent supervision for use of swallowing precautions.   The abovementioned oral motor deficits also impact pt's articulatory precision at the conversational level and pt needs overall supervision verbal cues to slow rate of speech and overarticulate to achieve intelligibility.   As a result, pt would benefit from skilled ST while inpatient in order to maximize functional independence and reduce burden of care prior to discharge.  Anticipate that pt may benefit from outpatient ST services following discharge.    Skilled Therapeutic Interventions          Cognitive-linguistic and bedside swallow evaluation completed with results and recommendations reviewed with family.     SLP Assessment  Patient will need skilled Speech Lanaguage Pathology Services during CIR admission    Recommendations  SLP Diet Recommendations: Dysphagia 3 (Mech soft);Thin Liquid Administration via: Cup;Straw Medication Administration: Whole meds with puree Supervision: Patient able to self feed;Intermittent supervision to cue for compensatory strategies Compensations: Slow rate;Small sips/bites;Chin tuck;Use straw to facilitate chin tuck Postural Changes and/or Swallow Maneuvers: Seated upright 90 degrees;Upright 30-60 min after meal Oral Care Recommendations: Oral care BID Patient destination: Home Follow up Recommendations: Outpatient  SLP Equipment Recommended: None recommended by SLP    SLP Frequency 1 to 3 out of 7 days   SLP Duration  SLP Intensity  SLP Treatment/Interventions    Minumum of 1-2 x/day, 30 to 90 minutes  Cueing hierarchy;Dysphagia/aspiration precaution training;Speech/Language facilitation;Patient/family education;Environmental controls    Pain Pain Assessment Pain Assessment: No/denies pain  Prior Functioning Cognitive/Linguistic Baseline: Within functional limits Type of Home: Mobile home  Lives With: Spouse Available Help at Discharge: Family;Available 24 hours/day Vocation: Retired  Function:  Eating Eating   Modified Consistency Diet: Yes Eating Assist Level: Swallowing techniques: self managed           Cognition Comprehension Comprehension assist level: Follows complex conversation/direction with no assist  Expression   Expression assist level: Expresses basic 90% of the time/requires cueing < 10% of the time.  Social Interaction Social Interaction assist level: Interacts appropriately with others - No medications needed.  Problem Solving Problem solving assist level: Solves complex problems: With extra time  Memory Memory assist level: More than reasonable amount of time   Short Term Goals: Week 1: SLP Short Term Goal 1 (Week 1): Pt will consume therapeutic trials of regular textures with mod I use of swallowing precautions to monitor and correct anterior labial loss of boluses.   SLP Short Term Goal 2 (Week 1): Pt will utilize  slow rate and overarticulation to achieve intelligibility at the conversational level with mod I.    Refer to Care Plan for Long Term Goals  Recommendations for other services: None   Discharge Criteria: Patient will be discharged from SLP if patient refuses treatment 3 consecutive times without medical reason, if treatment goals not met, if there is a change in medical status, if patient makes no progress towards goals or if patient is  discharged from hospital.  The above assessment, treatment plan, treatment alternatives and goals were discussed and mutually agreed upon: by patient  Emilio Math 09/10/2016, 9:23 AM

## 2016-09-10 NOTE — Progress Notes (Signed)
Patient information reviewed and entered into e[facility] system by Kayloni Rocco, RN, CRRN, PPS Coordinator.  Information including medical coding and functional independence measure will be reviewed and updated through discharge.     Per nursing patient was given "Data Collection Information Summary for Patients in Inpatient Rehabilitation Facilities with attached "Privacy Act Statement-Health Care Records" upon admission.  

## 2016-09-10 NOTE — Progress Notes (Signed)
Physical Therapy Note  Patient Details  Name: Kimberly Vaughn MRN: [id number] Date of Birth: 04-Mar-1945 Today's Date: 09/10/2016    Time: 1415-1442 27 minutes  1:1 No c/o pain.  Pt performed standing otago exercises with min A for sit to stands with 1 UE support.  Standing without UE support with min A due to Lt lean.  Pt with good understanding of exercises due to her time spent in cardiac rehab.   Blessings Inglett 09/10/2016, 2:42 PM

## 2016-09-10 NOTE — Progress Notes (Signed)
PHYSICAL MEDICINE & REHABILITATION     PROGRESS NOTE  Subjective/Complaints:  Pt seen laying in bed this AM.  She slept well overnight and she is looking forward to beginning therapies this AM.  ROS: Denies CP, SOB, N/V/D.  Objective: Vital Signs: Blood pressure (!) 151/70, pulse 68, temperature 98.1 F (36.7 C), temperature source Oral, resp. rate 18, height 5\' 1"  (1.549 m), weight 78.5 kg (173 lb 1 oz), SpO2 98 %. No results found.  Recent Labs  09/10/16 0518  WBC 11.6*  HGB 11.2*  HCT 32.6*  PLT 240    Recent Labs  09/08/16 0334 09/10/16 0518  NA 139 136  K 3.1* 3.3*  CL 102 102  GLUCOSE 134* 134*  BUN 15 19  CREATININE 0.54 0.69  CALCIUM 8.6* 8.5*   CBG (last 3)  No results for input(s): GLUCAP in the last 72 hours.  Wt Readings from Last 3 Encounters:  09/09/16 78.5 kg (173 lb 1 oz)  09/04/16 84.2 kg (185 lb 10 oz)  09/01/16 76.7 kg (169 lb)    Physical Exam:  BP (!) 151/70 (BP Location: Left Arm)   Pulse 68   Temp 98.1 F (36.7 C) (Oral)   Resp 18   Ht 5\' 1"  (1.549 m)   Wt 78.5 kg (173 lb 1 oz)   SpO2 98%   BMI 32.70 kg/m  Constitutional: She appears well-developed. Obese  HENT: Normocephalic. Left crani incision healing well-- C/D/I  Eyes: No discharge.  Left lid lag with inability to fully close eye lid.  Left eye medially deviated  Neck: Normal range of motion. Neck supple.  Cardiovascular: An irregular rhythm present. Frequent PVCs  Respiratory: Effort normal and breath sounds normal.  GI: Soft. Bowel sounds are normal.   Musculoskeletal: She exhibits no edema or tenderness.  Neurological: She is alert and oriented.  Significant facial weakness on left with sensory deficits.  Mild dysarthria  Able to follow one and two step commands without difficulty.  LUE with ataxia> dysmetria.  Motor: RUE/RLE: 5/5 proximal to distal LUE/LLE: 4+/5 proximal to distal  Skin: Skin is warm and dry.  Psychiatric: She has a normal mood and  affect. Her behavior is normal. Judgment and thought content normal.    Assessment/Plan: 1. 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 Physiatrist is providing close team supervision and 24 hour management of active medical problems listed below. Physiatrist and rehab team continue to assess barriers to discharge/monitor patient progress toward functional and medical goals.  Function:  Bathing Bathing position      Bathing parts      Bathing assist        Upper Body Dressing/Undressing Upper body dressing                    Upper body assist        Lower Body Dressing/Undressing Lower body dressing                                  Lower body assist        Toileting Toileting          Toileting assist     Transfers Chair/bed transfer             Locomotion Ambulation  Wheelchair          Cognition Comprehension    Expression    Social Interaction    Problem Solving    Memory      Medical Problem List and Plan: 1.   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  Begin CIR 2.  DVT Prophylaxis/Anticoagulation: Mechanical: Sequential compression devices, below knee Bilateral lower extremities and mobility.  3. Pain Management: hydrocodone prn effective.  4. Mood: LCSW to follow for evaluation and support.  5. Neuropsych: This patient is capable of making decisions on her own behalf. 6. Skin/Wound Care: Monitor incision for healing. Maintain adequate nutritional and hydration status.  7. Fluids/Electrolytes/Nutrition: Monitor I/Os  D3 thin, advance as tolerated 8. HTN: Monitor BP bid. Continue HCTZ and metoprolol  Monitor with increased mobility 9. Vestibular  symptoms: Vestibular eval and treatment.  10. PAF: Monitor HR bid. Continue metoprolol.   EKG pending 11. CAD s/p PTCA/stent: On low dose ASA, metoprolol and lipitor  12. GERD: Continue Protonix.  13. Hypokalemia: Improved after KCL runs. Multifactorial--D/Ced IVF. Added supplement as on diuretic. Encourage intake.   K+ 3.3 on 7/12  Cont to monitor 14. Impaired left eye closure: Eye drops qid and patch eye at bedtime.  14. Diarrhea: Discontinued colace 15. Hypoalbuminemia  Supplement initiated 7/12 16. ABLA  Hb 11.2 on 7/12  Cont to monitor 17. Leukocytosis  Likely steroid induced  WBCs 11.6 on 7/12  Cont to monitor  LOS (Days) 1 A FACE TO FACE EVALUATION WAS PERFORMED  Kimberly Vaughn Kimberly Vaughn 09/10/2016 8:47 AM

## 2016-09-10 NOTE — Evaluation (Signed)
Occupational Therapy Assessment and Plan  Patient Details  Name: Kimberly Vaughn MRN: [id number] Date of Birth: May 07, 1945  OT Diagnosis: ataxia, blindness and low vision, disturbance of vision and muscle weakness (generalized) Rehab Potential: Rehab Potential (ACUTE ONLY): Good ELOS: 7-10 days   Today's Date: 09/10/2016 OT Individual Time: 0930-1030 OT Individual Time Calculation (min): 60 min     Problem List:  Patient Active Problem List   Diagnosis Date Noted  . Dysphagia   . PVC (premature ventricular contraction)   . Hypoalbuminemia due to protein-calorie malnutrition ([place])   . Leucocytosis   . Brain mass 09/09/2016  . Ataxia   . Benign essential HTN   . PAF (paroxysmal atrial fibrillation) ([place])   . Gastroesophageal reflux disease   . Diarrhea   . History of MI (myocardial infarction)   . Acute blood loss anemia   . Hypokalemia   . Post-operative pain   . Mass of brain 09/04/2016  . Brain tumor ([place]) 09/04/2016  . Coronary artery disease 09/01/2016  . Paroxysmal atrial fibrillation ([place]) 09/01/2016  . Essential hypertension 09/01/2016  . BPPV (benign paroxysmal positional vertigo) 12/25/2015    Past Medical History:  Past Medical History:  Diagnosis Date  . A-fib ([place])   . Anginal pain ([place])   . Asthma    as a child  . Coronary artery disease   . DDD (degenerative disc disease), lumbar   . Degenerative joint disease   . Dizziness and giddiness   . Dysrhythmia    A fib  . Episodic atrial fibrillation ([place])   . Esophageal reflux   . GERD (gastroesophageal reflux disease)   . Hiatal hernia   . History of kidney stones   . Hypertension, essential   . Kidney stones   . Liver cyst LEFT  . Myocardial infarction ([place])   . Nodule of parotid gland   . Pneumonia   . PONV (postoperative nausea and vomiting)   . Pulmonary nodule   . PVC (premature ventricular contraction)    Nov 2017 & Feb 2018 noted on cardiac monitor worn at home   Past Surgical History:   Past Surgical History:  Procedure Laterality Date  . APPLICATION OF CRANIAL NAVIGATION N/A 09/04/2016   Procedure: APPLICATION OF CRANIAL NAVIGATION;  Surgeon: Consuella Lose, MD;  Location: [facility];  Service: Neurosurgery;  Laterality: N/A;  . BRAIN SURGERY  09/04/2016  . BREAST BIOPSY Bilateral   . CARPAL TUNNEL RELEASE Right 09/2013  . CATARACTS Bilateral   . CERVICAL SPINE SURGERY  04/2012  . FINGER SURGERY Right    TRIGGER FINGER RELEASE  . PARTIAL THYMECTOMY  1973   pt states doesn't remember this being done  . RETROSIGMOID CRANIECTOMY FOR TUMOR RESECTION Left 09/04/2016   Procedure: RETROSIGMOID CRANIECTOMY FOR TUMOR RESECTION WITH BRAINLAB NAVIGATION;  Surgeon: Consuella Lose, MD;  Location: [facility];  Service: Neurosurgery;  Laterality: Left;  CRANIOTOMY TUMOR EXCISION  . TONSILLECTOMY  1964  . VAGINAL HYSTERECTOMY  1974    Assessment & Plan Clinical Impression: Patient is a 71 y.o. female with progressive left hearing loss and facial droop since 01/2016 with diagnosis of cochlear nerve complex affecitng seventh and eight nerve. History taken from chart review and patient. Patient with CAD and MI requiring stent 03/2016 therefore surgery postponed with close observation. Follow up MRI showed marked enhancement of brainstem nodule with left facial numbness involving all three distributions of trigeminal nerve and concerns of malignancy and she was cleared for surgery by cards. She was  admitted on 09/04/16 for retrosigmoid craniectomy for tumor resection by Dr. Kathyrn Sheriff. Swallow evaluation done due to significant facial weakness and numbness--regular,thins with chin tuck recommended to prevent aspiration/penetration. Post-op MRI reviewed, showing partial resection of brainstem tumor. Therapy ongoing and patient limited by left visual deficits, balance deficits with ataxic gait and issues with intermittent dizziness/nausea with activity.  CIR recommended due to deficits in mobility and  ability to carry out ADL tasks.  Patient transferred to CIR on 09/09/2016 .    Patient currently requires mod with basic self-care skills secondary to muscle weakness, impaired timing and sequencing, ataxia and decreased coordination, decreased visual acuity, decreased visual perceptual skills and decreased visual motor skills and decreased standing balance, decreased postural control and decreased balance strategies.  Prior to hospitalization, patient could complete ADLs and IADLs with independent .  Patient will benefit from skilled intervention to increase independence with basic self-care skills and increase level of independence with iADL prior to discharge home with care partner.  Anticipate patient will require intermittent supervision and follow up outpatient.  OT - End of Session Activity Tolerance: Tolerates 30+ min activity with multiple rests Endurance Deficit: Yes Endurance Deficit Description: pt reports fatigue post shower.  seated rest breaks during session OT Assessment Rehab Potential (ACUTE ONLY): Good OT Patient demonstrates impairments in the following area(s): Balance;Endurance;Motor;Perception;Sensory;Skin Integrity;Vision OT Basic ADL's Functional Problem(s): Eating;Grooming;Bathing;Dressing;Toileting OT Advanced ADL's Functional Problem(s): Simple Meal Preparation;Light Housekeeping OT Transfers Functional Problem(s): Toilet;Tub/Shower OT Additional Impairment(s): Fuctional Use of Upper Extremity OT Plan OT Intensity: Minimum of 1-2 x/day, 45 to 90 minutes OT Frequency: 5 out of 7 days OT Duration/Estimated Length of Stay: 7-10 days OT Treatment/Interventions: Balance/vestibular training;Community reintegration;Discharge planning;Disease mangement/prevention;DME/adaptive equipment instruction;Functional mobility training;Neuromuscular re-education;Pain management;Patient/family education;Psychosocial support;Self Care/advanced ADL retraining;Skin care/wound  managment;Therapeutic Activities;Therapeutic Exercise;UE/LE Coordination activities;Visual/perceptual remediation/compensation OT Self Feeding Anticipated Outcome(s): Mod I OT Basic Self-Care Anticipated Outcome(s): Mod I OT Toileting Anticipated Outcome(s): Mod i OT Bathroom Transfers Anticipated Outcome(s): Mod I - Supervision OT Recommendation Recommendations for Other Services: Therapeutic Recreation consult Therapeutic Recreation Interventions: Kitchen group;Outing/community reintergration Patient destination: Home Follow Up Recommendations: Home health OT;Outpatient OT Equipment Recommended: To be determined   Skilled Therapeutic Intervention OT eval completed with discussion of rehab process, OT purpose, POC, ELOS, and goals.  ADL assessment completed with bathing at sit > stand level in room shower.  Ambulated EOB to room shower with mod assist due to decreased coordination and motor control.  Min assist for sitting and standing balance during bathing due to instability even in sitting.  Min assist for sitting and standing balance while dressing with min cues to visually scan to Lt to obtain items.    OT Evaluation Precautions/Restrictions  Precautions Precautions: Fall Precaution Comments: Does not hear out of L ear; L eye with blurred vision. (but improving) Pain Pain Assessment Pain Assessment: No/denies pain Home Living/Prior Functioning Home Living Family/patient expects to be discharged to:: Private residence Living Arrangements: Spouse/significant other Available Help at Discharge: Family, Available 24 hours/day, Friend(s) (has a lot of family/friends close that can assist) Type of Home: Mobile home Home Access: Ramped entrance, Stairs to enter [employer] of Steps: ramped entrance to door with approx 6" step into house.  One step at [place]: One level Bathroom Shower/Tub: Gaffer, Charity fundraiser: Standard  Lives With: Spouse IADL  History Homemaking Responsibilities: Yes Meal Prep Responsibility: Therapist, occupational Responsibility: Primary Cleaning Responsibility: Primary Bill Paying/Finance Responsibility: Primary Shopping Responsibility: Primary Current License: Yes Mode of Transportation: Musician  Occupation: Retired Prior Nurse, learning disability of Independence: Independent with basic ADLs, Independent with homemaking with ambulation, Independent with gait Vocation: Retired Comments: Used the walking stick and furniture cruised for support prior to admission ADL  See Function Navigator Vision Baseline Vision/History: No visual deficits Patient Visual Report: Blurring of vision;Diplopia (blurring to the Lt) Vision Assessment?: Yes Eye Alignment: Within Functional Limits Ocular Range of Motion: Restricted on the left (Lt eye restrticed to the Lt) Alignment/Gaze Preference: Within Defined Limits Tracking/Visual Pursuits: Other (comment);Decreased smoothness of horizontal tracking (horizontal nystagmus to the Lt with horizontal tracking) Saccades: Additional eye shifts occurred during testing Convergence: Within functional limits Additional Comments: Unable to fully close Lt eye.  Reports blurry vision when looking to the Lt.  Inconsistent double vision Cognition Overall Cognitive Status: Within Functional Limits for tasks assessed Arousal/Alertness: Awake/alert Orientation Level: Person;Place;Situation Person: Oriented Place: Oriented Situation: Oriented Year: 2018 Month: July Day of Week: Correct Immediate Memory Recall: Sock;Blue;Bed Memory Recall: Sock;Blue;Bed Memory Recall Sock: Without Cue Memory Recall Blue: Without Cue Memory Recall Bed: Without Cue Attention: Selective Selective Attention: Appears intact Safety/Judgment: Appears intact Sensation Sensation Light Touch: Impaired by gross assessment (absent in Lt side of face) Coordination Gross Motor Movements are Fluid and Coordinated: No Fine Motor  Movements are Fluid and Coordinated: No Finger Nose Finger Test: LUE ataxia and dysmetria.  Overshooting Motor    Mobility  Bed Mobility Bed Mobility: Rolling Left;Supine to Sit Rolling Left: 5: Supervision Supine to Sit: 4: Min guard Transfers Transfers: Sit to Stand Sit to Stand: 4: Min assist  Extremity/Trunk Assessment RUE Assessment RUE Assessment: Within Functional Limits LUE Assessment LUE Assessment: Within Functional Limits (ROM WFL, strength grossly 4+/5)   See Function Navigator for Current Functional Status.   Refer to Care Plan for Long Term Goals  Recommendations for other services: Therapeutic Recreation  Kitchen group and Outing/community reintegration   Discharge Criteria: Patient will be discharged from OT if patient refuses treatment 3 consecutive times without medical reason, if treatment goals not met, if there is a change in medical status, if patient makes no progress towards goals or if patient is discharged from hospital.  The above assessment, treatment plan, treatment alternatives and goals were discussed and mutually agreed upon: by patient  [name] 09/10/2016, 10:29 AM

## 2016-09-10 NOTE — Care Management Note (Signed)
Inpatient [facility] Individual Statement of Services  Patient Name:  Kimberly Vaughn  Date:  09/10/2016  Welcome to the [facility].  Our goal is to provide you with an individualized program based on your diagnosis and situation, designed to meet your specific needs.  With this comprehensive rehabilitation program, you will be expected to participate in at least 3 hours of rehabilitation therapies Monday-Friday, with modified therapy programming on the weekends.  Your rehabilitation program will include the following services:  Physical Therapy (PT), Occupational Therapy (OT), Speech Therapy (ST), 24 hour per day rehabilitation nursing, Therapeutic Recreaction (TR), Neuropsychology, Case Management (Social Worker), Rehabilitation Medicine, Nutrition Services and Pharmacy Services  Weekly team conferences will be held on Wednesdays to discuss your progress.  Your Social Worker will talk with you frequently to get your input and to update you on team discussions.  Team conferences with you and your family in attendance may also be held.  Expected length of stay: 7-10 days   Overall anticipated outcome: modified independent  Depending on your progress and recovery, your program may change. Your Social Worker will coordinate services and will keep you informed of any changes. Your Social Worker's name and contact numbers are listed  below.  The following services may also be recommended but are not provided by the [facility] will be made to provide these services after discharge if needed.  Arrangements include referral to agencies that provide these services.  Your insurance has been verified to be:  [facility] Medicare Your primary doctor is:  [place]  Pertinent information will be shared with your doctor and your insurance  company.  Social Worker:  [place], [place] or [phone]   Information discussed with and copy given to patient by: Lennart Pall, 09/10/2016, 2:13 PM

## 2016-09-10 NOTE — Addendum Note (Signed)
Addendum  created 09/10/16 2102 by Lyndle Herrlich, MD   Anesthesia Intra Blocks edited, Sign clinical note

## 2016-09-10 NOTE — Progress Notes (Signed)
Pt and daughter, Kimberly Vaughn, informed of rehab process, including safety and rehab booklet/materials.

## 2016-09-10 NOTE — Progress Notes (Signed)
Initial Nutrition Assessment  DOCUMENTATION CODES:   Obesity unspecified  INTERVENTION:  Provide Ensure Enlive po BID, each supplement provides 350 kcal and 20 grams of protein.  Discontinue Prostat.  Encourage adequate PO intake.   NUTRITION DIAGNOSIS:   Increased nutrient needs related to  (therapy and post op healing) as evidenced by estimated needs.  GOAL:   Patient will meet greater than or equal to 90% of their needs  MONITOR:   PO intake, Supplement acceptance, Labs, Weight trends, Skin, I & O's, Diet advancement  REASON FOR ASSESSMENT:   Malnutrition Screening Tool    ASSESSMENT:   71 y.o. 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 Follow up MRI showed marked enhancement of brainstem nodule with left facial numbness involving all three distributions of trigeminal nerve and concerns of malignancy.  Admitted on 09/04/16 for retrosigmoid craniectomy for tumor resection.  Pt reports appetite has improved recently. Meal completion has been 75%. Pt reports usually consuming at least 3 meals a day at home PTA. Pt reports no weight loss but instead weight gain recently. Pt currently has Ensure and Prostat ordered. RD to discontinue Prostat as intake will be adequate with Ensure between meals. Pt with no observed significant fat or muscle mass loss.   Labs and medications reviewed.   Diet Order:  DIET DYS 3 Room service appropriate? Yes with Assist; Fluid consistency: Thin  Skin:   (Incision on L head)  Last BM:  7/10  Height:   Ht Readings from Last 1 Encounters:  09/09/16 5\' 1"  (1.549 m)    Weight:   Wt Readings from Last 1 Encounters:  09/09/16 173 lb 1 oz (78.5 kg)    Ideal Body Weight:  47.7 kg  BMI:  Body mass index is 32.7 kg/m.  Estimated Nutritional Needs:   Kcal:  1700-1900  Protein:  90-100 grams  Fluid:  >/= 1.7 L/day  EDUCATION  NEEDS:   No education needs identified at this time  Corrin Parker, MS, RD, LDN Pager # [phone] After hours/ weekend pager # [phone]

## 2016-09-11 ENCOUNTER — Inpatient Hospital Stay (HOSPITAL_COMMUNITY): Payer: Medicare Other | Admitting: Occupational Therapy

## 2016-09-11 ENCOUNTER — Inpatient Hospital Stay (HOSPITAL_COMMUNITY): Payer: Medicare Other | Admitting: Speech Pathology

## 2016-09-11 ENCOUNTER — Inpatient Hospital Stay (HOSPITAL_COMMUNITY): Payer: Medicare Other | Admitting: Physical Therapy

## 2016-09-11 DIAGNOSIS — R739 Hyperglycemia, unspecified: Secondary | ICD-10-CM

## 2016-09-11 DIAGNOSIS — R0989 Other specified symptoms and signs involving the circulatory and respiratory systems: Secondary | ICD-10-CM

## 2016-09-11 DIAGNOSIS — D72829 Elevated white blood cell count, unspecified: Secondary | ICD-10-CM

## 2016-09-11 DIAGNOSIS — T380X5A Adverse effect of glucocorticoids and synthetic analogues, initial encounter: Secondary | ICD-10-CM

## 2016-09-11 DIAGNOSIS — I491 Atrial premature depolarization: Secondary | ICD-10-CM

## 2016-09-11 LAB — URINE CULTURE

## 2016-09-11 NOTE — Progress Notes (Signed)
Occupational Therapy Session Note  Patient Details  Name: Kimberly Vaughn MRN: [id number] Date of Birth: Nov 05, 1945  Today's Date: 09/11/2016 OT Individual Time: 4718-5501 OT Individual Time Calculation (min): 57 min    Short Term Goals: Week 1:  OT Short Term Goal 1 (Week 1): STG = LTGs due to estimated LOS  Skilled Therapeutic Interventions/Progress Updates:    Treatment session with focus on functional transfers, standing balance, and LUE fine motor coordination.  Pt received in room shower with previous OT.  Completed bathing at sit > stand in room shower with one LOB when drying buttocks in standing requiring min assist to correct.  Dressing completed with setup assist and assist to don TEDS.  Engaged in 9 hole peg test Rt: 34 seconds and Lt: 60 seconds.  Pt with decreased motor control and precision of movement.  Provided pt with yellow theraputty and 5 marbles, pt removed marbles x2 with cues to utilize LUE.  Family members arriving towards end of session, pt with increased distraction and difficulty with control with increased distraction.  Left upright in w/c visiting with family.  Therapy Documentation Precautions:  Precautions Precautions: Fall Precaution Comments: Does not hear out of L ear; L eye with blurred vision. (but improving) Restrictions Weight Bearing Restrictions: No General: General OT Amount of Missed Time: 2 Minutes (2) Vital Signs: Therapy Vitals Temp: 97.9 F (36.6 C) Temp Source: Oral Pulse Rate: (!) 55 Resp: 18 BP: (!) 116/43 Patient Position (if appropriate): Sitting Oxygen Therapy SpO2: 98 % O2 Device: Not Delivered Pain:  Pt with no c/o pain  See Function Navigator for Current Functional Status.   Therapy/Group: Individual Therapy  [name] 09/11/2016, 3:11 PM

## 2016-09-11 NOTE — Progress Notes (Signed)
Speech Language Pathology Daily Session Note  Patient Details  Name: Kimberly Vaughn MRN: [id number] Date of Birth: August 15, 1945  Today's Date: 09/11/2016 SLP Individual Time: 1330-1430 SLP Individual Time Calculation (min): 60 min  Short Term Goals: Week 1: SLP Short Term Goal 1 (Week 1): Pt will consume therapeutic trials of regular textures with mod I use of swallowing precautions to monitor and correct anterior labial loss of boluses.   SLP Short Term Goal 2 (Week 1): Pt will utilize slow rate and overarticulation to achieve intelligibility at the conversational level with mod I.    Skilled Therapeutic Interventions: Skilled treatment session focused on dysphagia and speech intelligibility goals. SLP facilitated session by providing skilled observation of pt consuming thin liquids with regular snack (peaches, graham crackers). Pt with immediate burping with each sip of thin liquids. Nectar trialed with burping present. Pt describes sensation of liquids getting stuck at cervical esophagus level until air is released with burp. As trials continued, pt's burp became wetter with throat clears present. Recommend MBS on 09/14/16 to further evaluate swallow function. Pt able to recall and use chin tuck with Mod I. SLP further facilitated session by providing Mod I for use of speech intelligibility strategies at the simple conversation level within barrier game. Pt returned to room, left with daughter and all questions answered to pt and daughter satisfaction.      Function:  Eating Eating   Modified Consistency Diet: No (Trials with SLP only) Eating Assist Level: Swallowing techniques: self managed           Cognition Comprehension Comprehension assist level: Follows complex conversation/direction with no assist  Expression   Expression assist level: Expresses complex 90% of the time/cues < 10% of the time  Social Interaction Social Interaction assist level: Interacts appropriately with others  - No medications needed.  Problem Solving Problem solving assist level: Solves complex problems: With extra time  Memory Memory assist level: More than reasonable amount of time    Pain    Therapy/Group: Individual Therapy  Tyera Hansley 09/11/2016, 5:22 PM

## 2016-09-11 NOTE — Progress Notes (Signed)
[place] PHYSICAL MEDICINE & REHABILITATION     PROGRESS NOTE  Subjective/Complaints:  Pt seen laying in bed this AM.  She slept well overnight.  She had a good day of therapies, except she felt her heart racing at times.   ROS: Denies CP, SOB, N/V/D.  Objective: Vital Signs: Blood pressure (!) 150/53, pulse 68, temperature 98.9 F (37.2 C), temperature source Oral, resp. rate 18, height 5\' 1"  (1.549 m), weight 78.5 kg (173 lb 1 oz), SpO2 100 %. No results found.  Recent Labs  09/10/16 0518  WBC 11.6*  HGB 11.2*  HCT 32.6*  PLT 240    Recent Labs  09/10/16 0518  NA 136  K 3.3*  CL 102  GLUCOSE 134*  BUN 19  CREATININE 0.69  CALCIUM 8.5*   CBG (last 3)  No results for input(s): GLUCAP in the last 72 hours.  Wt Readings from Last 3 Encounters:  09/09/16 78.5 kg (173 lb 1 oz)  09/04/16 84.2 kg (185 lb 10 oz)  09/01/16 76.7 kg (169 lb)    Physical Exam:  BP (!) 150/53 (BP Location: Right Arm)   Pulse 68   Temp 98.9 F (37.2 C) (Oral)   Resp 18   Ht 5\' 1"  (1.549 m)   Wt 78.5 kg (173 lb 1 oz)   SpO2 100%   BMI 32.70 kg/m  Constitutional: She appears well-developed. Obese  HENT: Normocephalic. Left crani incision healing  Eyes: No discharge.  Left lid lag with inability to fully close eye lid.  Left eye medially deviated, improving  Neck: Normal range of motion. Neck supple.  Cardiovascular: An irregular rhythm present. No JVD. Respiratory: Effort normal and breath sounds normal.  GI: Soft. Bowel sounds are normal.   Musculoskeletal: She exhibits no edema or tenderness.  Neurological: She is alert and oriented.  Significant facial weakness on left with sensory deficits.  Mild dysarthria  Able to follow one and two step commands without difficulty.  LUE with ataxia.  Motor: RUE/RLE: 5/5 proximal to distal LUE/LLE: 4+-5/5 proximal to distal  Skin: Skin is warm and dry.  Psychiatric: She has a normal mood and affect. Her behavior is normal. Judgment and  thought content normal.    Assessment/Plan: 1. 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 Physiatrist is providing close team supervision and 24 hour management of active medical problems listed below. Physiatrist and rehab team continue to assess barriers to discharge/monitor patient progress toward functional and medical goals.  Function:  Bathing Bathing position   Position: Shower  Bathing parts Body parts bathed by patient: Right arm, Left arm, Chest, Abdomen, Front perineal area, Buttocks, Right upper leg, Left upper leg, Right lower leg, Left lower leg, Back    Bathing assist Assist Level: Touching or steadying assistance(Pt > 75%)      Upper Body Dressing/Undressing Upper body dressing   What is the patient wearing?: Pull over shirt/dress     Pull over shirt/dress - Perfomed by patient: Thread/unthread right sleeve, Thread/unthread left sleeve, Put head through opening, Pull shirt over trunk          Upper body assist Assist Level: Touching or steadying assistance(Pt > 75%)      Lower Body Dressing/Undressing Lower body dressing   What is the patient wearing?: Underwear, Pants, Socks, Shoes Underwear - Performed by patient: Thread/unthread right underwear leg, Thread/unthread left underwear leg Underwear -  Performed by helper: Pull underwear up/down Pants- Performed by patient: Thread/unthread right pants leg, Thread/unthread left pants leg Pants- Performed by helper: Pull pants up/down     Socks - Performed by patient: Don/doff right sock, Don/doff left sock   Shoes - Performed by patient: Don/doff right shoe, Don/doff left shoe, Fasten right, Fasten left            Lower body assist Assist for lower body dressing: Touching or steadying assistance (Pt > 75%)      Toileting Toileting   Toileting  steps completed by patient: Adjust clothing prior to toileting, Performs perineal hygiene, Adjust clothing after toileting   Toileting Assistive Devices: Grab bar or rail  Toileting assist Assist level: Touching or steadying assistance (Pt.75%)   Transfers Chair/bed transfer     Chair/bed transfer assist level: Moderate assist (Pt 50 - 74%/lift or lower)       Locomotion Ambulation     Max distance: 10 Assist level: Moderate assist (Pt 50 - 74%)   Wheelchair          Cognition Comprehension Comprehension assist level: Follows complex conversation/direction with no assist  Expression Expression assist level: Expresses basic needs/ideas: With no assist  Social Interaction Social Interaction assist level: Interacts appropriately with others - No medications needed.  Problem Solving Problem solving assist level: Solves complex problems: With extra time  Memory Memory assist level: More than reasonable amount of time    Medical Problem List and Plan: 1.   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  Cont CIR 2.  DVT Prophylaxis/Anticoagulation: Mechanical: Sequential compression devices, below knee Bilateral lower extremities and mobility.  3. Pain Management: hydrocodone prn effective.  4. Mood: LCSW to follow for evaluation and support.  5. Neuropsych: This patient is capable of making decisions on her own behalf. 6. Skin/Wound Care: Monitor incision for healing. Maintain adequate nutritional and hydration status.  7. Fluids/Electrolytes/Nutrition: Monitor I/Os  D3 thin, advance as tolerated 8. HTN: Monitor BP bid. Continue HCTZ and metoprolol  Labile, but overall controlled 9. Vestibular symptoms: Vestibular eval and treatment.  10. PAF: Monitor HR bid. Continue metoprolol.   EKG showing PACs  Cont meds 11. CAD s/p PTCA/stent: On low dose ASA, metoprolol and lipitor   12. GERD: Continue Protonix.  13. Hypokalemia: Improved after KCL runs. Multifactorial--D/Ced IVF. Added supplement as on diuretic. Encourage intake.   K+ 3.3 on 7/12  Labs ordered for tomorrow  Cont to monitor 14. Impaired left eye closure: Eye drops qid and patch eye at bedtime.  14. Diarrhea: Discontinued colace 15. Hypoalbuminemia  Supplement initiated 7/12 16. ABLA  Hb 11.2 on 7/12  Cont to monitor 17. Leukocytosis  Likely steroid induced  WBCs 11.6 on 7/12  Labs ordered for tomorrow  Cont to monitor 18. Hyperglycemia  Steroid induced  Cont to monitor  LOS (Days) 2 A FACE TO FACE EVALUATION WAS PERFORMED  Ankit Lorie Phenix 09/11/2016 8:46 AM

## 2016-09-11 NOTE — Progress Notes (Signed)
Occupational Therapy Session Note  Patient Details  Name: Kimberly Vaughn MRN: [id number] Date of Birth: 09-03-1945  Today's Date: 09/11/2016 OT Individual Time: 0922-1035 OT Individual Time Calculation (min): 73 min    Skilled Therapeutic Interventions/Progress Updates: Kimberly Vaughn completed functional mobility and the start of her shower ADL with this clinician this am.   Focus was on energy conservation and endurance for safely increasing self care.  During the initial portion of the session, she required rest breaks during seated and functional moiblity activities about every 6 minutes or so.     As well, she completeed toilet transfer via walker from room to toilet with close S and toileting with close S  Patient did tend to have somewhat ataxic movements in left leg when walking and left neck when leaning left or fatigued.  She was handed off to the next OT practitioner to complete her room shower and dressing at the end of this session.   This clinician offered a few minutes more therapy right before lunch, but the patient stated she was tired and she had three visitors from out of town with whom she preferred to visit.  She was left in the care of her supportive daughter and visitors.    Therapy Documentation Precautions:  Precautions Precautions: Fall Precaution Comments: Does not hear out of L ear; L eye with blurred vision. (but improving) Restrictions Weight Bearing Restrictions: No  General OT Amount of Missed Time: 2 Minutes (2)  Pain: Pain Assessment Pain Assessment: No/denies pain Pain Score: 0-No pain   See Function Navigator for Current Functional Status.   Therapy/Group: Individual Therapy  Alfredia Ferguson [facility] 09/11/2016, 12:01 PM

## 2016-09-12 ENCOUNTER — Inpatient Hospital Stay (HOSPITAL_COMMUNITY): Payer: Medicare Other | Admitting: Speech Pathology

## 2016-09-12 ENCOUNTER — Inpatient Hospital Stay (HOSPITAL_COMMUNITY): Payer: Medicare Other

## 2016-09-12 ENCOUNTER — Inpatient Hospital Stay (HOSPITAL_COMMUNITY): Payer: Medicare Other | Admitting: Physical Therapy

## 2016-09-12 LAB — CBC WITH DIFFERENTIAL/PLATELET
BASOS ABS: 0 10*3/uL (ref 0.0–0.1)
BASOS PCT: 0 %
EOS ABS: 0 10*3/uL (ref 0.0–0.7)
EOS PCT: 0 %
HCT: 34.6 % — ABNORMAL LOW (ref 36.0–46.0)
Hemoglobin: 11.8 g/dL — ABNORMAL LOW (ref 12.0–15.0)
Lymphocytes Relative: 16 %
Lymphs Abs: 2.6 10*3/uL (ref 0.7–4.0)
MCH: 32.9 pg (ref 26.0–34.0)
MCHC: 34.1 g/dL (ref 30.0–36.0)
MCV: 96.4 fL (ref 78.0–100.0)
Monocytes Absolute: 1 10*3/uL (ref 0.1–1.0)
Monocytes Relative: 6 %
Neutro Abs: 12.3 10*3/uL — ABNORMAL HIGH (ref 1.7–7.7)
Neutrophils Relative %: 78 %
PLATELETS: 299 10*3/uL (ref 150–400)
RBC: 3.59 MIL/uL — AB (ref 3.87–5.11)
RDW: 13.3 % (ref 11.5–15.5)
WBC: 15.8 10*3/uL — AB (ref 4.0–10.5)

## 2016-09-12 LAB — BASIC METABOLIC PANEL
Anion gap: 9 (ref 5–15)
BUN: 21 mg/dL — AB (ref 6–20)
CO2: 24 mmol/L (ref 22–32)
CREATININE: 0.58 mg/dL (ref 0.44–1.00)
Calcium: 8.8 mg/dL — ABNORMAL LOW (ref 8.9–10.3)
Chloride: 102 mmol/L (ref 101–111)
GFR calc Af Amer: >60 mL/min (ref >60)
Glucose, Bld: 126 mg/dL — ABNORMAL HIGH (ref 65–99)
Potassium: 4.5 mmol/L (ref 3.5–5.1)
SODIUM: 135 mmol/L (ref 135–145)

## 2016-09-12 MED ORDER — TICAGRELOR 90 MG PO TABS
90.0000 mg | ORAL_TABLET | Freq: Two times a day (BID) | ORAL | Status: DC
  Administered 2016-09-12: 90 mg via ORAL
  Filled 2016-09-12: qty 1

## 2016-09-12 MED ORDER — TICAGRELOR 90 MG PO TABS: 90.0000 mg | ORAL_TABLET | Freq: Two times a day (BID) | ORAL | Status: DC

## 2016-09-12 NOTE — Progress Notes (Signed)
Kimberly Vaughn is a 71 y.o. female 01-23-1946 [id number]  Subjective: No new complaints. No new problems. Slept well. Feeling OK.  Objective: Vital signs in last 24 hours: Temp:  [97.9 F (36.6 C)-98.1 F (36.7 C)] 98.1 F (36.7 C) (07/14 0451) Pulse Rate:  [55-66] 66 (07/14 0451) Resp:  [18] 18 (07/14 0451) BP: (116)/(43) 116/43 (07/13 1436) SpO2:  [98 %-100 %] 100 % (07/14 0451) Weight change:  Last BM Date: 09/05/16  Intake/Output from previous day: 07/13 0701 - 07/14 0700 In: 800 [P.O.:800] Out: -   Physical Exam General: No apparent distress    Lungs: Normal effort. Lungs clear to auscultation, no crackles or wheezes. Cardiovascular: Regular rate and rhythm, no edema Musculoskeletal:  Neurovascularly intact Neurological: No new neurological deficits Wounds: N/A    Clean, dry, intact. No signs of infection.  Lab Results: BMET    Component Value Date/Time   NA 135 09/12/2016 0623   NA 140 07/22/2016 1247   K 4.5 09/12/2016 0623   CL 102 09/12/2016 0623   CO2 24 09/12/2016 0623   GLUCOSE 126 (H) 09/12/2016 0623   BUN 21 (H) 09/12/2016 0623   BUN 23 07/22/2016 1247   CREATININE 0.58 09/12/2016 0623   CALCIUM 8.8 (L) 09/12/2016 0623   GFRNONAA >60 09/12/2016 0623   GFRAA >60 09/12/2016 0623   CBC    Component Value Date/Time   WBC 15.8 (H) 09/12/2016 0623   RBC 3.59 (L) 09/12/2016 0623   HGB 11.8 (L) 09/12/2016 0623   HGB 13.1 07/22/2016 1247   HCT 34.6 (L) 09/12/2016 0623   HCT 38.5 07/22/2016 1247   PLT 299 09/12/2016 0623   PLT 224 07/22/2016 1247   MCV 96.4 09/12/2016 0623   MCV 98 (H) 07/22/2016 1247   MCH 32.9 09/12/2016 0623   MCHC 34.1 09/12/2016 0623   RDW 13.3 09/12/2016 0623   RDW 14.1 07/22/2016 1247   LYMPHSABS 2.6 09/12/2016 0623   LYMPHSABS 1.7 07/22/2016 1247   MONOABS 1.0 09/12/2016 0623   EOSABS 0.0 09/12/2016 0623   EOSABS 0.0 07/22/2016 1247   BASOSABS 0.0 09/12/2016 0623   BASOSABS 0.0 07/22/2016 1247   CBG's (last 3):  No  results for input(s): GLUCAP in the last 72 hours. LFT's Lab Results  Component Value Date   ALT 24 09/10/2016   AST 19 09/10/2016   ALKPHOS 57 09/10/2016   BILITOT 0.8 09/10/2016    Studies/Results: No results found.  Medications:  I have reviewed the patient's current medications. Scheduled Medications: . artificial tears   Left Eye QHS  . aspirin EC  81 mg Oral Daily  . atorvastatin  80 mg Oral q1800  . cholecalciferol  2,000 Units Oral Daily  . dexamethasone  4 mg Oral Q8H  . feeding supplement (ENSURE ENLIVE)  237 mL Oral BID BM  . hydrochlorothiazide  12.5 mg Oral q morning - 10a  . metoprolol tartrate  25 mg Oral BID  . naphazoline-glycerin  1-2 drop Left Eye TID WC & HS  . pantoprazole  80 mg Oral QAC breakfast  . potassium chloride  20 mEq Oral BID  . ticagrelor  90 mg Oral BID  . vitamin B-12  5,000 mcg Oral Daily   PRN Medications: acetaminophen, alum & mag hydroxide-simeth, bisacodyl, diphenhydrAMINE, guaiFENesin-dextromethorphan, HYDROcodone-acetaminophen, meclizine, nitroGLYCERIN, polyethylene glycol, prochlorperazine **OR** prochlorperazine **OR** prochlorperazine, sodium phosphate, traZODone  Assessment/Plan: Principal Problem:   Brain mass Active Problems:   Coronary artery disease   Dysphagia   Hypoalbuminemia due to  protein-calorie malnutrition ([place])   Leucocytosis   Premature atrial complex   Steroid-induced hyperglycemia   Labile blood pressure   1. Debility due to malignant brain mass with tumor resection 7/6 by NSurg causing dysarthria and L facial paralysis - continue CIR 2. HTN - continue meds 3. PAF - on beta-blocker 4. CAD hx with PTCA early 2018 - continue ASA, beta-blocker and statin - ok to restart Brilinta 7/13 per DC summary from Nsurg -order resumed 5. Steroid induced hyperglycemia - monitor - start SSI prn 6. B cell lymphoma per path (CNS tumor resected) - will need NSurg follow up to recommended next course of eval and  tx   Length of stay, days: 3  [name], MD 09/12/2016, 12:02 PM

## 2016-09-12 NOTE — Progress Notes (Signed)
Occupational Therapy Session Note  Patient Details  Name: Kimberly Vaughn MRN: [id number] Date of Birth: March 15, 1945  Today's Date: 09/12/2016 OT Individual Time: 1700-1745 OT Individual Time Calculation (min): 45 min    Short Term Goals: Week 1:  OT Short Term Goal 1 (Week 1): STG = LTGs due to estimated LOS  Skilled Therapeutic Interventions/Progress Updates:    1;1. No pain reported. Focus of session on forced use of LUE, [place] LUE, toileting and dressing. Pt requests to eat dinner. Pt self feeds with forced use of LUE as dominant hand to improve [facility] and coordination of hand while manipulating utensils. Pt requires VC for feeding strategies from SLP protocol with delayed cough after meal finished. Pt changes into pajamas and buttons up shirt with increased time 2/2 decreased FMC of LUE. Pt dons pull up pants with CGA for sit to stand and VC for safety awareness. Pt ambulates to bathroom with RW with CGA and VC for RW management to complete toileting with supervision. Pt completes oral care and washes hands at sink with supervision in standing. Exited session with pt seated in bed with call light in reach and all needs met.   Therapy Documentation Precautions:  Precautions Precautions: Fall Precaution Comments: Does not hear out of L ear; L eye with blurred vision. (but improving) Restrictions Weight Bearing Restrictions: No  See Function Navigator for Current Functional Status.   Therapy/Group: Individual Therapy  Tonny Branch 09/12/2016, 5:55 PM

## 2016-09-12 NOTE — Progress Notes (Signed)
Occupational Therapy Session Note  Patient Details  Name: Kimberly Vaughn MRN: [id number] Date of Birth: [birth year]  Today's Date: 09/12/2016 OT Individual Time: 0700-0757 OT Individual Time Calculation (min): 57 min    Short Term Goals: Week 1:  OT Short Term Goal 1 (Week 1): STG = LTGs due to estimated LOS  Skilled Therapeutic Interventions/Progress Updates:    1:1. Pt with no c/o pain. Pt ambulates with RW with CGA and Vc for RW management. Pt completes toileting with supervision and VC for safety awareness. Pt transfers on TTB with supervision to bathe 10/10 body parts with supervision and Vc for LUE use. Pt dresses at sit to stand level at sink with supervision and 1 LOB to L when advancing pants past hips with min A to correct. Pt dons footwear by assuming seated figure 4 and OT dons ted hose with total A. Pt completes oral and hair care in sitting in w/c 2/2 fatigue with set up. Exited session with pt set up for breakfast as SLP coming in for next session with call light in reach and all needs met.   Therapy Documentation Precautions:  Precautions Precautions: Fall Precaution Comments: Does not hear out of L ear; L eye with blurred vision. (but improving) Restrictions Weight Bearing Restrictions: No  See Function Navigator for Current Functional Status.   Therapy/Group: Individual Therapy  Tonny Branch 09/12/2016, 9:49 AM

## 2016-09-12 NOTE — Progress Notes (Signed)
Physical Therapy Session Note  Patient Details  Name: Kimberly Vaughn MRN: [id number] Date of Birth: 02-Sep-1945  Today's Date: 09/12/2016 PT Individual Time: 1430-1530 PT Individual Time Calculation (min): 60 min   Short Term Goals: Week 1:  PT Short Term Goal 1 (Week 1): STG=LTG  Skilled Therapeutic Interventions/Progress Updates: Pt received seated in w/c, denies pain and agreeable to treatment. W/c propulsion to gym BUE with S. Stairs ascent/descent 2 trials 8 stairs each 3" height with B handrails and step-to pattern; first trial leading with stronger RLE ascent/LLE descent, second trial with weaker LLE lead ascent/RLE descent for focus on L NMR. Both required min guard. Lateral step ups 2x15 with LLE on 3" step with min guard for hip abduction strength, coordinated knee control and strengthening. Standing balance on airex foam pad without UE support for focus on ankle strategy and righting reactions; variable S>maxA with both anterior and posterior LOBs. Standing heel/toe raises 2x15 each with light UE support. Nustep x5 min totalA on level 4 with average 50 steps/min; several short rest breaks due to fatigue and pt encouraged to slow step rate to allow for longer bouts before fatigued. Returned to room [name] with friend pushing patient; remained seated in w/c at end of session, all needs in reach.      Therapy Documentation Precautions:  Precautions Precautions: Fall Precaution Comments: Does not hear out of L ear; L eye with blurred vision. (but improving) Restrictions Weight Bearing Restrictions: No   See Function Navigator for Current Functional Status.   Therapy/Group: Individual Therapy  Luberta Mutter 09/12/2016, 3:25 PM

## 2016-09-12 NOTE — Progress Notes (Signed)
Speech Language Pathology Daily Session Note  Patient Details  Name: Kimberly Vaughn MRN: [id number] Date of Birth: 06-24-1945  Today's Date: 09/12/2016 SLP Individual Time: 0800-0830 SLP Individual Time Calculation (min): 30 min  Short Term Goals: Week 1: SLP Short Term Goal 1 (Week 1): Pt will consume therapeutic trials of regular textures with mod I use of swallowing precautions to monitor and correct anterior labial loss of boluses.   SLP Short Term Goal 2 (Week 1): Pt will utilize slow rate and overarticulation to achieve intelligibility at the conversational level with mod I.    Skilled Therapeutic Interventions: Skilled treatment session focused on dysphagia and speech intelligibility goals. SLP facilitated session by providing skilled observation of pt consuming dysphagia 3 breakfast supplemented with graham crackers. Pt with Mod I use of chin tuck and continues to have burp with each sip of thin liquids with and without chin tuck. Pt with sensation of reguritation. Nectar thick doesn't change sensation. Pt with ~ 100% speech intelligibility at the simple conversation level. Pt with Mod I use of speech intelligibility strategies. Pt left upright in wheelchair and all needs within reach. Continue per current plan of care.      Function:  Eating Eating   Modified Consistency Diet: Yes Eating Assist Level: Swallowing techniques: self managed           Cognition Comprehension Comprehension assist level: Follows complex conversation/direction with no assist  Expression   Expression assist level: Expresses complex 90% of the time/cues < 10% of the time  Social Interaction Social Interaction assist level: Interacts appropriately with others - No medications needed.  Problem Solving Problem solving assist level: Solves complex problems: With extra time  Memory Memory assist level: More than reasonable amount of time    Pain    Therapy/Group: Individual Therapy  Kimberly Vaughn 09/12/2016, 8:14 AM

## 2016-09-12 NOTE — Progress Notes (Signed)
Medication alert sent to pharmacy for theoretical drug interaction between ticagrelor and dexamethasone.   Patient on dexamethasone s/p Retrosigmoid craniectomy for biopsy/resection of left-sided CPA mass  Patient on ticagrelor s/p DES to RCA at [facility] 03/2016. Now 6 months post PCI - spoke to patient cardiologist -  As need to change ticagrelor to clopidogrel or ok to stop antiplatelet at this time.  OK to stop ticagrelor.  Bonnita Nasuti Pharm.D. CPP, BCPS Clinical Pharmacist [phone] 09/12/2016 3:20 PM

## 2016-09-13 DIAGNOSIS — I2510 Atherosclerotic heart disease of native coronary artery without angina pectoris: Secondary | ICD-10-CM

## 2016-09-13 NOTE — Progress Notes (Signed)
Kimberly Vaughn is a 71 y.o. female 11-17-1945 [id number]  Subjective: Appreciative of care and customer service on CIR - Looking forward to visit from friends and family next 48h  Objective: Vital signs in last 24 hours: Temp:  [97.7 F (36.5 C)-98.6 F (37 C)] 97.7 F (36.5 C) (07/15 0424) Pulse Rate:  [60-73] 60 (07/15 0424) Resp:  [18] 18 (07/15 0424) BP: (123-135)/(46-72) 135/58 (07/15 0424) SpO2:  [98 %] 98 % (07/15 0424) Weight:  [81.3 kg (179 lb 3 oz)] 81.3 kg (179 lb 3 oz) (07/14 1925) Weight change:  Last BM Date: 09/05/16  Intake/Output from previous day: 07/14 0701 - 07/15 0700 In: 620 [P.O.:620] Out: -   Physical Exam General: No apparent distress   Facial droop on L Lungs: Normal effort. Lungs clear to auscultation, no crackles or wheezes. Cardiovascular: irregular rate and rhythm, no edema Neurological: No new neurological deficits Wounds: Clean, dry, intact. No signs of infection.  Lab Results: BMET    Component Value Date/Time   NA 135 09/12/2016 0623   NA 140 07/22/2016 1247   K 4.5 09/12/2016 0623   CL 102 09/12/2016 0623   CO2 24 09/12/2016 0623   GLUCOSE 126 (H) 09/12/2016 0623   BUN 21 (H) 09/12/2016 0623   BUN 23 07/22/2016 1247   CREATININE 0.58 09/12/2016 0623   CALCIUM 8.8 (L) 09/12/2016 0623   GFRNONAA >60 09/12/2016 0623   GFRAA >60 09/12/2016 0623   CBC    Component Value Date/Time   WBC 15.8 (H) 09/12/2016 0623   RBC 3.59 (L) 09/12/2016 0623   HGB 11.8 (L) 09/12/2016 0623   HGB 13.1 07/22/2016 1247   HCT 34.6 (L) 09/12/2016 0623   HCT 38.5 07/22/2016 1247   PLT 299 09/12/2016 0623   PLT 224 07/22/2016 1247   MCV 96.4 09/12/2016 0623   MCV 98 (H) 07/22/2016 1247   MCH 32.9 09/12/2016 0623   MCHC 34.1 09/12/2016 0623   RDW 13.3 09/12/2016 0623   RDW 14.1 07/22/2016 1247   LYMPHSABS 2.6 09/12/2016 0623   LYMPHSABS 1.7 07/22/2016 1247   MONOABS 1.0 09/12/2016 0623   EOSABS 0.0 09/12/2016 0623   EOSABS 0.0 07/22/2016 1247   BASOSABS 0.0 09/12/2016 0623   BASOSABS 0.0 07/22/2016 1247   CBG's (last 3):  No results for input(s): GLUCAP in the last 72 hours. LFT's Lab Results  Component Value Date   ALT 24 09/10/2016   AST 19 09/10/2016   ALKPHOS 57 09/10/2016   BILITOT 0.8 09/10/2016    Studies/Results: No results found.  Medications:  I have reviewed the patient's current medications. Scheduled Medications: . artificial tears   Left Eye QHS  . aspirin EC  81 mg Oral Daily  . atorvastatin  80 mg Oral q1800  . cholecalciferol  2,000 Units Oral Daily  . dexamethasone  4 mg Oral Q8H  . feeding supplement (ENSURE ENLIVE)  237 mL Oral BID BM  . hydrochlorothiazide  12.5 mg Oral q morning - 10a  . metoprolol tartrate  25 mg Oral BID  . naphazoline-glycerin  1-2 drop Left Eye TID WC & HS  . pantoprazole  80 mg Oral QAC breakfast  . potassium chloride  20 mEq Oral BID  . vitamin B-12  5,000 mcg Oral Daily   PRN Medications: acetaminophen, alum & mag hydroxide-simeth, bisacodyl, diphenhydrAMINE, guaiFENesin-dextromethorphan, HYDROcodone-acetaminophen, meclizine, nitroGLYCERIN, polyethylene glycol, prochlorperazine **OR** prochlorperazine **OR** prochlorperazine, sodium phosphate, traZODone  Assessment/Plan: Principal Problem:   Brain mass Active Problems:   Coronary  artery disease   Dysphagia   Hypoalbuminemia due to protein-calorie malnutrition ([facility])   Leucocytosis   Premature atrial complex   Steroid-induced hyperglycemia   Labile blood pressure   1. Debility due to malignant brain mass with tumor resection 7/6 by NSurg causing dysarthria and L facial paralysis - continue CIR 2. HTN - continue meds 3. PAF - on beta-blocker 4. CAD hx with PTCA 03/2016 - continue ASA, beta-blocker and statin - as 39mo from PTCA at [facility], pharmacy reviewed with cards on 7/14 and confirmed ok to NOT restart Brilinta at thsi time per DC summary from Nsurg -thus Brilinta not to be resumed at DC from CIR 5. Steroid  induced hyperglycemia - monitor - consider SSI prn 6. B cell lymphoma per path (CNS tumor resected) - will need NSurg follow up to recommended next course of eval and tx   Length of stay, days: 4  [name] A. Asa Lente, MD 09/13/2016, 9:33 AM

## 2016-09-14 ENCOUNTER — Inpatient Hospital Stay (HOSPITAL_COMMUNITY): Payer: Medicare Other | Admitting: Physical Therapy

## 2016-09-14 ENCOUNTER — Inpatient Hospital Stay (HOSPITAL_COMMUNITY): Payer: Medicare Other | Admitting: Speech Pathology

## 2016-09-14 ENCOUNTER — Inpatient Hospital Stay (HOSPITAL_COMMUNITY): Payer: Medicare Other

## 2016-09-14 ENCOUNTER — Inpatient Hospital Stay (HOSPITAL_COMMUNITY): Payer: Medicare Other | Admitting: Occupational Therapy

## 2016-09-14 MED ORDER — DEXAMETHASONE 4 MG PO TABS
4.0000 mg | ORAL_TABLET | Freq: Three times a day (TID) | ORAL | Status: DC
  Administered 2016-09-14 – 2016-09-22 (×23): 4 mg via ORAL
  Filled 2016-09-14 (×23): qty 1

## 2016-09-14 NOTE — Progress Notes (Addendum)
[place] PHYSICAL MEDICINE & REHABILITATION     PROGRESS NOTE  Subjective/Complaints:  Up in w/c. Feeling well. Working hard with therapies. Has noticed that she feels light-headed, short of breath at times with exercise. Sometimes she feels that heart rate is up.  ROS: pt denies nausea, vomiting, diarrhea, cough, shortness of breath or chest pain   Objective: Vital Signs: Blood pressure (!) 139/56, pulse (!) 59, temperature (!) 97.5 F (36.4 C), temperature source Oral, resp. rate 18, height 5\' 1"  (1.549 m), weight 78.7 kg (173 lb 9.6 oz), SpO2 99 %. No results found.  Recent Labs  09/12/16 0623  WBC 15.8*  HGB 11.8*  HCT 34.6*  PLT 299    Recent Labs  09/12/16 0623  NA 135  K 4.5  CL 102  GLUCOSE 126*  BUN 21*  CREATININE 0.58  CALCIUM 8.8*   CBG (last 3)  No results for input(s): GLUCAP in the last 72 hours.  Wt Readings from Last 3 Encounters:  09/13/16 78.7 kg (173 lb 9.6 oz)  09/04/16 84.2 kg (185 lb 10 oz)  09/01/16 76.7 kg (169 lb)    Physical Exam:  BP (!) 139/56 (BP Location: Left Arm)   Pulse (!) 59   Temp (!) 97.5 F (36.4 C) (Oral)   Resp 18   Ht 5\' 1"  (1.549 m)   Wt 78.7 kg (173 lb 9.6 oz)   SpO2 99%   BMI 32.80 kg/m  Constitutional: She appears well-developed. Obese  HENT: Normocephalic. Left crani incision healing  Eyes: No discharge.  Left lid lag with inability to fully close eye lid.  Left eye medially deviated, improving  Neck: Normal range of motion. Neck supple.  Cardiovascular: Regular Rhythm today. No murmur. Occasional PAC Respiratory: CTA Bilaterally without wheezes or rales. Normal effort   GI: Soft. Bowel sounds are normal.   Musculoskeletal: She exhibits no edema or tenderness.  Neurological: She is alert and oriented.  Significant facial weakness on left with sensory deficits.  Mild dysarthria  Able to follow one and two step commands without difficulty.  LUE with ataxia.  Motor: RUE/RLE: 5/5 proximal to  distal LUE/LLE: 4+-5/5 proximal to distal  Skin: Skin is warm and dry.  Psychiatric: She has a normal mood and affect. Her behavior is normal. Judgment and thought content normal.    Assessment/Plan: 1. 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 Physiatrist is providing close team supervision and 24 hour management of active medical problems listed below. Physiatrist and rehab team continue to assess barriers to discharge/monitor patient progress toward functional and medical goals.  Function:  Bathing Bathing position   Position: Shower  Bathing parts Body parts bathed by patient: Right arm, Left arm, Chest, Abdomen, Front perineal area, Buttocks, Right upper leg, Left upper leg, Right lower leg, Left lower leg, Back    Bathing assist Assist Level: Touching or steadying assistance(Pt > 75%)      Upper Body Dressing/Undressing Upper body dressing   What is the patient wearing?: Pull over shirt/dress     Pull over shirt/dress - Perfomed by patient: Thread/unthread right sleeve, Thread/unthread left sleeve, Put head through opening, Pull shirt over trunk          Upper body assist Assist Level: Set up   Set up : To obtain clothing/put away  Lower Body Dressing/Undressing Lower body dressing   What is the patient  wearing?: Pants, Shoes, [employer], Underwear Underwear - Performed by patient: Thread/unthread right underwear leg, Thread/unthread left underwear leg Underwear - Performed by helper: Pull underwear up/down Pants- Performed by patient: Thread/unthread right pants leg, Thread/unthread left pants leg, Pull pants up/down Pants- Performed by helper: Pull pants up/down     Socks - Performed by patient: Don/doff right sock, Don/doff left sock   Shoes - Performed by patient: Don/doff right shoe, Don/doff left shoe,  Fasten right, Fasten left         TED Hose - Performed by helper: Don/doff right TED hose, Don/doff left TED hose  Lower body assist Assist for lower body dressing: Set up, Touching or steadying assistance (Pt > 75%)   Set up : Don/doff TED stockings  Toileting Toileting   Toileting steps completed by patient: Adjust clothing prior to toileting, Performs perineal hygiene, Adjust clothing after toileting   Toileting Assistive Devices: Grab bar or rail  Toileting assist Assist level: Supervision or verbal cues   Transfers Chair/bed transfer   Chair/bed transfer method: Squat pivot Chair/bed transfer assist level: Moderate assist (Pt 50 - 74%/lift or lower) Chair/bed transfer assistive device: Armrests, Medical sales representative     Max distance: 10 Assist level: Moderate assist (Pt 50 - 74%)   Wheelchair   Type: Manual Max wheelchair distance: 150 Assist Level: Supervision or verbal cues  Cognition Comprehension Comprehension assist level: Follows complex conversation/direction with no assist  Expression Expression assist level: Expresses complex 90% of the time/cues < 10% of the time  Social Interaction Social Interaction assist level: Interacts appropriately with others - No medications needed.  Problem Solving Problem solving assist level: Solves complex problems: With extra time  Memory Memory assist level: More than reasonable amount of time    Medical Problem List and Plan: 1.   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  Cont CIR 2.  DVT Prophylaxis/Anticoagulation: Mechanical: Sequential compression devices, below knee Bilateral lower extremities and mobility.  3. Pain Management: hydrocodone prn effective at present.  4. Mood: LCSW to follow for evaluation and support.  5. Neuropsych: This patient is capable of making decisions on her own behalf. 6.  Skin/Wound Care: Monitor incision for healing. Maintain adequate nutritional and hydration status.  7. Fluids/Electrolytes/Nutrition: Monitor I/Os  D3 thin, advance as tolerated---MBS today  -labs on 7/14 WNL 8. HTN: Monitor BP bid. Continue HCTZ and metoprolol  Labile, but overall controlled 9. Vestibular symptoms: Vestibular eval and treatment.  10. PAF: Monitor HR bid. Continue metoprolol.   EKG showing PACs  Rate controlled at rest. Follow rate during exercise for abnl increases.   -check orthostatics x 2 days 11. CAD s/p PTCA/stent: On low dose ASA, metoprolol and lipitor  12. GERD: Continue Protonix.  13. Hypokalemia: Improved after KCL runs. Multifactorial--D/Ced IVF. Added supplement as on diuretic. Encourage intake.   K+ 4.5 on 7/14    14. Impaired left eye closure: Eye drops qid and patch eye at bedtime.  14. Diarrhea: Discontinued colace 15. Hypoalbuminemia  Supplement initiated 7/12 16. ABLA  Hb 11.2 on 7/12  Cont to monitor 17. Leukocytosis  Likely steroid induced  WBCs 11.6 on 7/12    18. Hyperglycemia  Steroid induced  Cont to monitor  LOS (Days) 5 A FACE TO FACE EVALUATION WAS PERFORMED  Myldred Raju T 09/14/2016 10:00 AM

## 2016-09-14 NOTE — Progress Notes (Signed)
Physical Therapy Note  Patient Details  Name: Kimberly Vaughn MRN: [id number] Date of Birth: Feb 06, 1946 Today's Date: 09/14/2016    Time: 1410-1510 60 minutes  1:1 No c/o pain.  Pt able to propel w/c with UEs to/from gym with supervision.  Gait with RW with min A for balance 100'.  Gait with obstacle negotiation and stepping over obstacles with min A.  Side and backward stepping with and without RW for balance with min A with RW, mod A without RW.  Pt with LOB posteriorly and to the Lt especially when fatigued.  Standing balance on foam without UE support with min A for static stance, mod/max A for dynamic balance on foam.  Standing on foam with UE support dynamic tasks with min A, improving with repetition.     Regis Wiland 09/14/2016, 3:11 PM

## 2016-09-14 NOTE — Progress Notes (Signed)
Modified Barium Swallow Progress Note  Patient Details  Name: Kimberly Vaughn MRN: [id number] Date of Birth: October 16, 1945  Today's Date: 09/14/2016  Modified Barium Swallow completed.  Full report located under Chart Review in the Imaging Section.  Brief recommendations include the following:  Clinical Impression  Pt presents with functional oropharyngeal swallow despite left facial flaccidity. However she presents with primary esophageal dysphagia. With each bolus of nectar thick liquids or thin liquids, pt with eruction that resulted in backflow from lower thoraic to pyriform sinuses. Pt able to protect her airway and clear residue in pyriform sinuses with immediate double swallow. Esophageal sweep with solids revealed severe stasis throughout esophagus. Unknow origin of deficits but differential diagnosis include known GERD and possibility of trauma to cranial nerve X during surgery that might be impacting esophageal motility. Given that pt is 10 days post-op and planned discharge date of ~ 09/18/16, i am requesting GI consult to aid in differential diagnosis and potentional intervention. Recommend pt continue current diet of dysphagia 3 with thin liquids, with strict aspiration precuations to include following solids with liquids, double swallow immediately after eruction, and medicine whole in puree (not in liquids).    Swallow Evaluation Recommendations   Recommended Consults: Consider GI evaluation;Consider esophageal assessment   SLP Diet Recommendations: Dysphagia 3 (Mech soft) solids;Thin liquid   Liquid Administration via: Cup;Straw   Medication Administration: Whole meds with puree   Supervision: Patient able to self feed   Compensations: Minimize environmental distractions;Slow rate;Small sips/bites;Follow solids with liquid   Postural Changes: Remain semi-upright after after feeds/meals (Comment);Seated upright at 90 degrees   Oral Care Recommendations: Oral care BID         Shealyn Sean 09/14/2016,12:38 PM

## 2016-09-14 NOTE — Progress Notes (Signed)
Speech Language Pathology Daily Session Note  Patient Details  Name: Kimberly Vaughn MRN: [id number] Date of Birth: [birth year]  Today's Date: 09/14/2016 SLP Individual Time: 1200-1215 SLP Individual Time Calculation (min): 15 min  Short Term Goals: Week 1: SLP Short Term Goal 1 (Week 1): Pt will consume therapeutic trials of regular textures with mod I use of swallowing precautions to monitor and correct anterior labial loss of boluses.   SLP Short Term Goal 2 (Week 1): Pt will utilize slow rate and overarticulation to achieve intelligibility at the conversational level with mod I.    Skilled Therapeutic Interventions: Skilled treatment session focused on self-care education. Results of the MBS shared with pt, compensatory strategies reviewed, pt able to recall with Mod I, new sign with compensatory swallow strategies posted [facility] and POC described including Dr Naaman Plummer to order barium swallow study to assess esophageal function. Pt left in wheelchair and all needs within reach. Continue per current plan of care.      Function:  Eating Eating   Modified Consistency Diet: Yes Eating Assist Level: Swallowing techniques: self managed           Cognition Comprehension Comprehension assist level: Follows complex conversation/direction with no assist  Expression   Expression assist level: Expresses complex 90% of the time/cues < 10% of the time  Social Interaction Social Interaction assist level: Interacts appropriately with others - No medications needed.  Problem Solving Problem solving assist level: Solves complex problems: With extra time  Memory      Pain    Therapy/Group: Individual Therapy  Erinne Gillentine 09/14/2016, 2:54 PM

## 2016-09-14 NOTE — Progress Notes (Signed)
Occupational Therapy Session Note  Patient Details  Name: Kimberly Vaughn MRN: [id number] Date of Birth: 1945-04-24  Today's Date: 09/14/2016 OT Individual Time: 1030-1130 and 1300-1345 OT Individual Time Calculation (min): 60 min and 45 min   Short Term Goals: Week 1:  OT Short Term Goal 1 (Week 1): STG = LTGs due to estimated LOS  Skilled Therapeutic Interventions/Progress Updates:    1) Treatment session with focus on functional mobility, standing balance, and use of LUE.  Pt ambulated to bathroom with RW with supervision.  Completed toileting with supervision.  Pt with minor LOB when turning to enter walk-in shower, able to correct without assistance.  Supervision for all bathing at sit > stand level, however required steady assist when drying buttocks post shower.  Pt with increased ataxia when exiting bathroom due to fatigue.  Dressing completed at sit > stand level, pt able to don TEDS and shoes this session.  Provided pt with theraputty exercises with pt able to demonstrate each with min cues.  Encouraged to continue to work on putty to increase LUE coordination and strength.  2) Treatment session with focus on LUE NMR.  Pt received upright in w/c.  Ambulated to toilet with RW and min guard.  Completed toileting with supervision and min assist exiting bathroom due to fatigue and increased ataxia.  Ambulated approx 100 feet with RW and min assist - min guard.  Engaged in [place] in sitting with focus on gross and fine motor control with various sized blocks and resistive clothespins.  Pt with overshooting in Lt visual field > Rt.  Min cues to visually attend to LUE during fine motor coordination tasks to increase control.  Returned to room via w/c and left upright with all needs in reach.  Therapy Documentation Precautions:  Precautions Precautions: Fall Precaution Comments: Does not hear out of L ear; L eye with blurred vision. (but improving) Restrictions Weight Bearing Restrictions:  No General:   Vital Signs: Therapy Vitals Pulse Rate: 65 BP: (!) 128/48 Pain:  Pt with no c/o pain  See Function Navigator for Current Functional Status.   Therapy/Group: Individual Therapy  [name] 09/14/2016, 12:22 PM

## 2016-09-15 ENCOUNTER — Inpatient Hospital Stay (HOSPITAL_COMMUNITY): Payer: Medicare Other

## 2016-09-15 ENCOUNTER — Inpatient Hospital Stay (HOSPITAL_COMMUNITY): Payer: Medicare Other | Admitting: Physical Therapy

## 2016-09-15 ENCOUNTER — Inpatient Hospital Stay (HOSPITAL_COMMUNITY): Payer: Medicare Other | Admitting: Occupational Therapy

## 2016-09-15 DIAGNOSIS — R1312 Dysphagia, oropharyngeal phase: Secondary | ICD-10-CM

## 2016-09-15 MED ORDER — ENSURE ENLIVE PO LIQD
237.0000 mL | Freq: Every day | ORAL | Status: DC
  Administered 2016-09-17: 237 mL via ORAL

## 2016-09-15 NOTE — Evaluation (Signed)
Recreational Therapy Assessment and Plan  Patient Details  Name: Kimberly Vaughn MRN: [id number] Date of Birth: 1945/10/06 Today's Date: 09/15/2016  Rehab Potential: Good ELOS: 14 days   Assessment Problem List:      Patient Active Problem List   Diagnosis Date Noted  . Dysphagia   . PVC (premature ventricular contraction)   . Hypoalbuminemia due to protein-calorie malnutrition ([place])   . Leucocytosis   . Brain mass 09/09/2016  . Ataxia   . Benign essential HTN   . PAF (paroxysmal atrial fibrillation) ([place])   . Gastroesophageal reflux disease   . Diarrhea   . History of MI (myocardial infarction)   . Acute blood loss anemia   . Hypokalemia   . Post-operative pain   . Mass of brain 09/04/2016  . Brain tumor ([place]) 09/04/2016  . Coronary artery disease 09/01/2016  . Paroxysmal atrial fibrillation ([place]) 09/01/2016  . Essential hypertension 09/01/2016  . BPPV (benign paroxysmal positional vertigo) 12/25/2015    Past Medical History:      Past Medical History:  Diagnosis Date  . A-fib ([place])   . Anginal pain ([place])   . Asthma    as a child  . Coronary artery disease   . DDD (degenerative disc disease), lumbar   . Degenerative joint disease   . Dizziness and giddiness   . Dysrhythmia    A fib  . Episodic atrial fibrillation ([place])   . Esophageal reflux   . GERD (gastroesophageal reflux disease)   . Hiatal hernia   . History of kidney stones   . Hypertension, essential   . Kidney stones   . Liver cyst LEFT  . Myocardial infarction ([place])   . Nodule of parotid gland   . Pneumonia   . PONV (postoperative nausea and vomiting)   . Pulmonary nodule   . PVC (premature ventricular contraction)    Nov 2017 & Feb 2018 noted on cardiac monitor worn at home   Past Surgical History:       Past Surgical History:  Procedure Laterality Date  . APPLICATION OF CRANIAL NAVIGATION N/A 09/04/2016   Procedure: APPLICATION OF CRANIAL  NAVIGATION;  Surgeon: Consuella Lose, MD;  Location: [facility];  Service: Neurosurgery;  Laterality: N/A;  . BRAIN SURGERY  09/04/2016  . BREAST BIOPSY Bilateral   . CARPAL TUNNEL RELEASE Right 09/2013  . CATARACTS Bilateral   . CERVICAL SPINE SURGERY  04/2012  . FINGER SURGERY Right    TRIGGER FINGER RELEASE  . PARTIAL THYMECTOMY  1973   pt states doesn't remember this being done  . RETROSIGMOID CRANIECTOMY FOR TUMOR RESECTION Left 09/04/2016   Procedure: RETROSIGMOID CRANIECTOMY FOR TUMOR RESECTION WITH BRAINLAB NAVIGATION;  Surgeon: Consuella Lose, MD;  Location: [facility];  Service: Neurosurgery;  Laterality: Left;  CRANIOTOMY TUMOR EXCISION  . TONSILLECTOMY  1964  . VAGINAL HYSTERECTOMY  1974    Assessment & Plan Clinical Impression: Patient is a 71 y.o. year old female with progressive left hearing loss and facial droop since 01/2016 with diagnosis of cochlear nerve complex affecitng seventh and eight nerve. History taken from chart review and patient. Patient with CAD and MI requiring stent 03/2016 therefore surgery postponed with close observation. Follow up MRI showed marked enhancement of brainstem nodule with left facial numbness involving all three distributions of trigeminal nerve and concerns of malignancy and she was cleared for surgery by cards. She was admitted on 09/04/16 for retrosigmoid craniectomy for tumor resection .  Patient transferred to CIR on 09/09/2016 .  Pt presents with decreased activity tolerance, decreased functional mobility, decreased balance, decreased coordination, decreased vision Limiting pt's independence with leisure/community pursuits.   Leisure History/Participation Premorbid leisure interest/current participation: Medical laboratory scientific officer - Doctor, hospital - Building control surveyor - Travel (Comment) Other Leisure Interests: Risk manager Leisure Participation Style: With Family/Friends Awareness of Community Resources:  Good-identify 3 post discharge leisure resources Psychosocial / Spiritual Spiritual Interests: [place]: Cooperative Academic librarian Appropriate for Education?: Yes Recreational Therapy Orientation Orientation -Reviewed with patient: Available activity resources Strengths/Weaknesses Patient Strengths/Abilities: Willingness to participate;Active premorbidly Patient weaknesses: Physical limitations TR Patient demonstrates impairments in the following area(s): Edema;Endurance;Motor;Safety  Plan Rec Therapy Plan Is patient appropriate for Therapeutic Recreation?: Yes Rehab Potential: Good Treatment times per week: Min 1 TR group/session > 20 minutes during LOS Estimated Length of Stay: 14 days TR Treatment/Interventions: Adaptive equipment instruction;1:1 session;Balance/vestibular training;Functional mobility training;Community reintegration;Group participation (Comment);Patient/family education;Therapeutic activities;Recreation/leisure participation;Therapeutic exercise;UE/LE Coordination activities;Visual/perceptual remediation/compensation  Recommendations for other services: None   Discharge Criteria: Patient will be discharged from TR if patient refuses treatment 3 consecutive times without medical reason.  If treatment goals not met, if there is a change in medical status, if patient makes no progress towards goals or if patient is discharged from hospital.  The above assessment, treatment plan, treatment alternatives and goals were discussed and mutually agreed upon: by patient  [place] 09/15/2016, 12:53 PM

## 2016-09-15 NOTE — Progress Notes (Signed)
Nutrition Follow-up  DOCUMENTATION CODES:   Obesity unspecified  INTERVENTION:  Provide Ensure Enlive po once daily, each supplement provides 350 kcal and 20 grams of protein.  Encourage adequate PO intake.  NUTRITION DIAGNOSIS:   Increased nutrient needs related to  (therapy and post op healing) as evidenced by estimated needs; ongoing  GOAL:   Patient will meet greater than or equal to 90% of their needs; met  MONITOR:   PO intake, Supplement acceptance, Labs, Weight trends, Skin, I & O's, Diet advancement  REASON FOR ASSESSMENT:   Malnutrition Screening Tool    ASSESSMENT:   71 y.o. 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 Follow up MRI showed marked enhancement of brainstem nodule with left facial numbness involving all three distributions of trigeminal nerve and concerns of malignancy.  Admitted on 09/04/16 for retrosigmoid craniectomy for tumor resection.  Meal completion has improved and is now 100%. Pt currently has Ensure BID ordered and has been consuming most of them. Pt reports she is too full at times to consume an Ensure. RD to modify orders to Ensure once daily as intake has improved.   Diet Order:  DIET DYS 3 Room service appropriate? Yes with Assist; Fluid consistency: Thin  Skin:   (Incision on L head)  Last BM:  7/16  Height:   Ht Readings from Last 1 Encounters:  09/09/16 '5\' 1"'$  (1.549 m)    Weight:   Wt Readings from Last 1 Encounters:  09/13/16 173 lb 9.6 oz (78.7 kg)    Ideal Body Weight:  47.7 kg  BMI:  Body mass index is 32.8 kg/m.  Estimated Nutritional Needs:   Kcal:  1700-1900  Protein:  90-100 grams  Fluid:  >/= 1.7 L/day  EDUCATION NEEDS:   No education needs identified at this time  Corrin Parker, MS, RD, LDN Pager # [phone] After hours/ weekend pager # [phone]

## 2016-09-15 NOTE — Progress Notes (Signed)
Occupational Therapy Session Note  Patient Details  Name: Hayleen Clinkscales MRN: [id number] Date of Birth: 1945-11-15  Today's Date: 09/15/2016 OT Individual Time: [phone] and 4765-4650 OT Individual Time Calculation (min): 58 min and 57 min   Short Term Goals: Week 1:  OT Short Term Goal 1 (Week 1): STG = LTGs due to estimated LOS  Skilled Therapeutic Interventions/Progress Updates:    1) Treatment session with focus on dynamic standing balance, trunk control, and LUE motor control.  Pt ambulated to toilet with RW and supervision, completing hygiene at sit > stand level with supervision.  Grooming tasks completed in standing at sink with distant supervision, pt alternating UE support on sink to ensure postural control.  Pt requesting to complete shower during PM session, therefore dressed with setup assist and close supervision when standing to pull pants over hips.  Engaged in dynamic balance with reaching into closet to gather clothing and placing dirty clothes into lower drawer.  In therapy gym engaged in reaching with focus on visual scanning and weight shift to Lt when reaching up and outside BOS to place horseshoes on basketball goal, pt required min guard - min assist with all reaching tasks.  Completed resistive peg board activity in standing with focus on replicating a pattern in Lt visual field while utilizing LUE.  Pt with decreased coordination when reaching to Lt with LUE.  2) Treatment session with focus on functional mobility with RW, dynamic standing balance, and self-care retraining.  Pt ambulated around room with supervision with use of RW to obtain items prior to bathing in room shower.  Completed toilet and walk-in shower transfers with supervision with RW.  Bathing completed with overall supervision, demonstrating improved sitting and standing balance.  Completed dressing at sit > stand level from w/c.  Issued pt a walker bag to increase safety and hand placement on RW while  transporting items.  Pt obtained dirty clothing from floor in bathroom with min guard while bending down and transported them to dirty laundry drawer via RW with walker bag. [name] present and observing throughout session.  Therapy Documentation Precautions:  Precautions Precautions: Fall Precaution Comments: Does not hear out of L ear; L eye with blurred vision. (but improving) Restrictions Weight Bearing Restrictions: No General:   Vital Signs: Therapy Vitals Temp: 98.1 F (36.7 C) Temp Source: Oral Pulse Rate: 64 Resp: 16 BP: (!) 139/57 (RN notifed) Patient Position (if appropriate): Lying Oxygen Therapy SpO2: 97 % O2 Device: Not Delivered Pain:   ADL:   Vision   Perception    Praxis   Exercises:   Other Treatments:    See Function Navigator for Current Functional Status.   Therapy/Group: Individual Therapy  [name] 09/15/2016, 7:58 AM

## 2016-09-15 NOTE — Progress Notes (Signed)
[facility] PHYSICAL MEDICINE & REHABILITATION     PROGRESS NOTE  Subjective/Complaints:  Pt seen sitting up in her chair this AM.  She slept well overnight and feels she is making daily progress. Denies issues with swallowing as well.   ROS: Denies nausea, vomiting, diarrhea,  shortness of breath or chest pain   Objective: Vital Signs: Blood pressure (!) 139/57, pulse 64, temperature 98.1 F (36.7 C), temperature source Oral, resp. rate 16, height 5\' 1"  (1.549 m), weight 78.7 kg (173 lb 9.6 oz), SpO2 97 %. 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 Dysphagia, with frequent/constant belching when swallowing. EXAM: ESOPHOGRAM/BARIUM SWALLOW TECHNIQUE: Single contrast examination was performed using  thin barium. FLUOROSCOPY TIME:  Fluoroscopy Time:  1 minutes 14 seconds Radiation Exposure Index (if provided by the fluoroscopic device): Number of Acquired Spot Images: 0 COMPARISON:  Chest CT 07/23/2016. FINDINGS: A single contrast study was undertaken and the patient tolerated this well. No obstruction to the forward flow of contrast throughout the esophagus and into the stomach. Normal esophageal course and contour. Normal esophageal mucosal pattern. Overall normal esophageal course and contour. Occasional tertiary contractions occurred, but with a fairly typical appearance for this age group. Normal gastroesophageal junction. A 12.5 mm barium tablet was administered and passed to the stomach without significant delay. The patient did exhibit frequent belching with each swallow. The etiology for this is unclear. IMPRESSION: Essentially normal for age esophagram. Negative for esophageal stricture or obstruction, and a 12.5 mm barium tablet was able to pass to the stomach. Electronically Signed   By: Genevie Ann M.D.   On: 09/15/2016 10:40    Dg Swallowing Func-speech Pathology  Result Date: 09/14/2016 Objective Swallowing Evaluation: Type of Study: MBS-Modified Barium Swallow Study Patient Details Name: Kimberly Vaughn MRN: [id number] Date of Birth: 16-Oct-1945 Today's Date: 09/14/2016 Time: SLP Start Time (ACUTE ONLY): 0941-SLP Stop Time (ACUTE ONLY): 0957 SLP Time Calculation (min) (ACUTE ONLY): 16 min Past Medical History: Past Medical History: Diagnosis Date . A-fib ([place])  . Anginal pain ([place])  . Asthma   as a child . Coronary artery disease  . DDD (degenerative disc disease), lumbar  . Degenerative joint disease  . Dizziness and giddiness  . Dysrhythmia   A fib . Episodic atrial fibrillation ([place])  . Esophageal reflux  . GERD (gastroesophageal reflux disease)  . Hiatal hernia  . History of kidney stones  . Hypertension, essential  . Kidney stones  . Liver cyst LEFT . Myocardial infarction ([place])  . Nodule of parotid gland  . Pneumonia  . PONV (postoperative nausea and vomiting)  . Pulmonary nodule  . PVC (premature ventricular contraction)   Nov 2017 & Feb 2018 noted on cardiac monitor worn at home Past Surgical History: Past Surgical History: Procedure Laterality Date . APPLICATION OF CRANIAL NAVIGATION N/A 05/02/4399  Procedure: APPLICATION OF CRANIAL NAVIGATION;  Surgeon: Consuella Lose, MD;  Location: [facility];  Service: Neurosurgery;  Laterality: N/A; . BRAIN SURGERY  09/04/2016 . BREAST BIOPSY Bilateral  . CARPAL TUNNEL RELEASE Right 09/2013 . CATARACTS Bilateral  . CERVICAL SPINE SURGERY  04/2012 . FINGER SURGERY Right   TRIGGER FINGER RELEASE . PARTIAL THYMECTOMY  1973  pt states doesn't remember this being done . RETROSIGMOID CRANIECTOMY FOR TUMOR RESECTION Left 09/04/2016  Procedure: RETROSIGMOID CRANIECTOMY FOR TUMOR RESECTION WITH BRAINLAB NAVIGATION;  Surgeon: Consuella Lose, MD;  Location: [facility];  Service: Neurosurgery;  Laterality: Left;  CRANIOTOMY TUMOR EXCISION . TONSILLECTOMY  1964 . VAGINAL HYSTERECTOMY  1974 HPI: Pt is a 71  y.o. female with rapidly enlarging left-sided CP angle lesion including enhancement of the seventh eighth nerve complex, and progressive hearing loss and facial droop. 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 PMH includes PNA, HTN, HH, GERD, DDD, CAD, asthma, a fib Subjective: The patient was seen in radiology for determine current swallowing physiology.  Assessment / Plan / Recommendation CHL IP CLINICAL IMPRESSIONS 09/14/2016 Clinical Impression Pt presents with functional oropharyngeal swallow despite left facial flaccidity. However she presents with primary esophageal dysphagia. With each bolus of nectar thick liquids or thin liquids, pt with eruction that resulted in backflow from lower thoraic to pyriform sinuses. Pt able to protect her airway and clear residue in pyriform sinuses with immediate double swallow. Esophageal sweep with solids revealed severe stasis throughout esophagus. Unknow origin of deficits but differential diagnosis include known GERD and possibility of trauma to cranial nerve X during surgery that might be impacting esophageal motility. Given that pt is 10 days post-op and planned discharge date of ~ 09/18/16, i am requesting GI consult to aid in differential diagnosis and potentional intervention. Recommend pt continue current diet of dysphagia 3 with thin liquids, with strict aspiration precuations to include following solids with liquids, double swallow immediately after eruction, and medicine whole in puree (not in liquids).  SLP Visit Diagnosis Dysphagia, pharyngoesophageal phase (R13.14) Attention and concentration deficit following -- Frontal lobe and executive function deficit following -- Impact on safety and function Mild aspiration risk   CHL IP TREATMENT RECOMMENDATION 09/06/2016 Treatment Recommendations Therapy as outlined in treatment plan below   Prognosis  09/06/2016 Prognosis for Safe Diet Advancement Good Barriers to Reach Goals -- Barriers/Prognosis Comment -- CHL IP DIET RECOMMENDATION 09/14/2016 SLP Diet Recommendations Dysphagia 3 (Mech soft) solids;Thin liquid Liquid Administration via Cup;Straw Medication Administration Whole meds with puree Compensations Minimize environmental distractions;Slow rate;Small sips/bites;Follow solids with liquid Postural Changes Remain semi-upright after after feeds/meals (Comment);Seated upright at 90 degrees   CHL IP OTHER RECOMMENDATIONS 09/14/2016 Recommended Consults Consider GI evaluation;Consider esophageal assessment Oral Care Recommendations Oral care BID Other Recommendations --   CHL IP FOLLOW UP RECOMMENDATIONS 09/14/2016 Follow up Recommendations Outpatient SLP   CHL IP FREQUENCY AND DURATION 09/06/2016 Speech Therapy Frequency (ACUTE ONLY) min 2x/week Treatment Duration 2 weeks      CHL IP ORAL PHASE 09/14/2016 Oral Phase WFL Oral - Pudding Teaspoon -- Oral - Pudding Cup -- Oral - Honey Teaspoon -- Oral - Honey Cup -- Oral - Nectar Teaspoon -- Oral - Nectar Cup -- Oral - Nectar Straw -- Oral - Thin Teaspoon -- Oral - Thin Cup -- Oral - Thin Straw -- Oral - Puree -- Oral - Mech Soft -- Oral - Regular -- Oral - Multi-Consistency WFL Oral - Pill -- Oral Phase - Comment --  CHL IP PHARYNGEAL PHASE 09/14/2016 Pharyngeal Phase Impaired Pharyngeal- Pudding Teaspoon -- Pharyngeal -- Pharyngeal- Pudding Cup -- Pharyngeal -- Pharyngeal- Honey Teaspoon NT Pharyngeal -- Pharyngeal- Honey Cup -- Pharyngeal -- Pharyngeal- Nectar Teaspoon Delayed swallow initiation-vallecula Pharyngeal -- Pharyngeal- Nectar Cup Delayed swallow initiation-vallecula Pharyngeal -- Pharyngeal- Nectar Straw -- Pharyngeal -- Pharyngeal- Thin Teaspoon Delayed swallow initiation-pyriform sinuses;Delayed swallow initiation-vallecula Pharyngeal -- Pharyngeal- Thin Cup Delayed swallow initiation-vallecula;Delayed swallow initiation-pyriform sinuses Pharyngeal --  Pharyngeal- Thin Straw Delayed swallow initiation-vallecula;Delayed  swallow initiation-pyriform sinuses Pharyngeal -- Pharyngeal- Puree WFL Pharyngeal -- Pharyngeal- Mechanical Soft -- Pharyngeal -- Pharyngeal- Regular -- Pharyngeal -- Pharyngeal- Multi-consistency NT Pharyngeal -- Pharyngeal- Pill -- Pharyngeal -- Pharyngeal Comment --  CHL IP CERVICAL ESOPHAGEAL PHASE 09/14/2016 Cervical Esophageal Phase Impaired Pudding Teaspoon -- Pudding Cup -- Honey Teaspoon -- Honey Cup -- Nectar Teaspoon Esophageal backflow into the pharynx Nectar Cup Esophageal backflow into the pharynx Nectar Straw -- Thin Teaspoon Esophageal backflow into the pharynx Thin Cup Esophageal backflow into the pharynx Thin Straw Esophageal backflow into the pharynx Puree WFL Mechanical Soft -- Regular -- Multi-consistency -- Pill -- Cervical Esophageal Comment Pt with eruction of all liquid consistencies with backflow into pyriform sinuses CHL IP GO 09/06/2016 Functional Assessment Tool Used (None) Functional Limitations Swallowing Swallow Current Status (Z6109) (None) Swallow Goal Status (U0454) (None) Swallow Discharge Status (U9811) (None) Motor Speech Current Status (B1478) (None) Motor Speech Goal Status (G9562) (None) Motor Speech Goal Status (Z3086) (None) Spoken Language Comprehension Current Status (V7846) (None) Spoken Language Comprehension Goal Status (N6295) (None) Spoken Language Comprehension Discharge Status (M8413) (None) Spoken Language Expression Current Status (K4401) (None) Spoken Language Expression Goal Status (U2725) (None) Spoken Language Expression Discharge Status (D6644) (None) Attention Current Status (I3474) (None) Attention Goal Status (Q5956) (None) Attention Discharge Status (L8756) (None) Memory Current Status (E3329) (None) Memory Goal Status (J1884) (None) Memory Discharge Status (Z6606) (None) Voice Current Status (T0160) (None) Voice Goal Status (F0932) (None) Voice Discharge Status (T5573) (None) Other  Speech-Language Pathology Functional Limitation Current Status (U2025) (None) Other Speech-Language Pathology Functional Limitation Goal Status (K2706) (None) Other Speech-Language Pathology Functional Limitation Discharge Status (C3762) (None) Happi Overton 09/14/2016, 12:38 PM              No results for input(s): WBC, HGB, HCT, PLT in the last 72 hours. No results for input(s): NA, K, CL, GLUCOSE, BUN, CREATININE, CALCIUM in the last 72 hours.  Invalid input(s): CO CBG (last 3)  No results for input(s): GLUCAP in the last 72 hours.  Wt Readings from Last 3 Encounters:  09/13/16 78.7 kg (173 lb 9.6 oz)  09/04/16 84.2 kg (185 lb 10 oz)  09/01/16 76.7 kg (169 lb)    Physical Exam:  BP (!) 139/57 (BP Location: Left Arm) Comment: RN notifed  Pulse 64   Temp 98.1 F (36.7 C) (Oral)   Resp 16   Ht 5\' 1"  (1.549 m)   Wt 78.7 kg (173 lb 9.6 oz)   SpO2 97%   BMI 32.80 kg/m  Constitutional: She appears well-developed. Obese  HENT: Normocephalic. Left crani incision healing  Eyes: No discharge.  Left eye medially deviated, improving  Neck: Normal range of motion. Neck supple.  Cardiovascular: RRR. No JVD. Respiratory: CTA Bilaterally. Normal effort   GI: Soft. Bowel sounds are normal.   Musculoskeletal: She exhibits no edema or tenderness.  Neurological: She is alert and oriented.  Significant facial weakness on left with sensory deficits, improving.  Mild dysarthria  Able to follow one and two step commands without difficulty.  LUE with ataxia.  Motor: RUE/RLE: 5/5 proximal to distal LUE/LLE: 4+-5/5 proximal to distal (stable) Skin: Skin is warm and dry.  Psychiatric: She has a normal mood and affect. Her behavior is normal. Judgment and thought content normal.    Assessment/Plan: 1. Functional deficits secondary to facial/vestibularcochlear nerve mass extending into brachium pontis which require 3+ hours per day of interdisciplinary therapy in a comprehensive inpatient rehab  setting. Physiatrist is providing close team supervision and  24 hour management of active medical problems listed below. Physiatrist and rehab team continue to assess barriers to discharge/monitor patient progress toward functional and medical goals.  Function:  Bathing Bathing position   Position: Shower  Bathing parts Body parts bathed by patient: Right arm, Left arm, Chest, Abdomen, Front perineal area, Buttocks, Right upper leg, Left upper leg, Right lower leg, Left lower leg, Back    Bathing assist Assist Level: Touching or steadying assistance(Pt > 75%)      Upper Body Dressing/Undressing Upper body dressing   What is the patient wearing?: Pull over shirt/dress     Pull over shirt/dress - Perfomed by patient: Thread/unthread right sleeve, Thread/unthread left sleeve, Put head through opening, Pull shirt over trunk          Upper body assist Assist Level: Supervision or verbal cues   Set up : To obtain clothing/put away  Lower Body Dressing/Undressing Lower body dressing   What is the patient wearing?: Pants, Shoes, [employer]  - Performed by patient: Thread/unthread right underwear leg, Thread/unthread left underwear leg Underwear - Performed by helper: Pull underwear up/down Pants- Performed by patient: Thread/unthread right pants leg, Thread/unthread left pants leg, Pull pants up/down Pants- Performed by helper: Pull pants up/down     Socks - Performed by patient: Don/doff right sock, Don/doff left sock   Shoes - Performed by patient: Don/doff right shoe, Don/doff left shoe, Fasten right, Fasten left       TED Hose - Performed by patient: Don/doff right TED hose, Don/doff left TED hose TED Hose - Performed by helper: Don/doff right TED hose, Don/doff left TED hose  Lower body assist Assist for lower body dressing: Supervision or verbal cues   Set up : Don/doff TED stockings  Toileting Toileting   Toileting steps completed by patient: Adjust clothing  prior to toileting, Performs perineal hygiene, Adjust clothing after toileting Toileting steps completed by helper: Adjust clothing prior to toileting, Adjust clothing after toileting (per [place] Ingram, NT assist with clothing) Toileting Assistive Devices: Grab bar or rail  Toileting assist Assist level: Supervision or verbal cues   Transfers Chair/bed transfer   Chair/bed transfer method: Ambulatory Chair/bed transfer assist level: Supervision or verbal cues Chair/bed transfer assistive device: Walker, Air cabin crew     Max distance: 10 Assist level: Moderate assist (Pt 50 - 74%)   Wheelchair   Type: Manual Max wheelchair distance: 150 Assist Level: Supervision or verbal cues  Cognition Comprehension Comprehension assist level: Follows complex conversation/direction with no assist  Expression Expression assist level: Expresses complex 90% of the time/cues < 10% of the time  Social Interaction Social Interaction assist level: Interacts appropriately with others - No medications needed.  Problem Solving Problem solving assist level: Solves complex problems: With extra time  Memory Memory assist level: More than reasonable amount of time    Medical Problem List and Plan: 1.   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  Cont CIR 2.  DVT Prophylaxis/Anticoagulation: Mechanical: Sequential compression devices, below knee Bilateral lower extremities and mobility.  3. Pain Management: hydrocodone prn effective at present.  4. Mood: LCSW to follow for evaluation and support.  5. Neuropsych: This patient is capable of making decisions on her own behalf. 6. Skin/Wound Care: Monitor incision for healing. Maintain adequate nutritional and hydration status.  7. Fluids/Electrolytes/Nutrition: Monitor I/Os  D3 thin, advance as tolerated, after repeat MBS as  well 8.  HTN: Monitor BP bid. Continue HCTZ and metoprolol  Controlled 7/17 9. Vestibular symptoms: Vestibular eval and treatment.  10. PAF: Monitor HR bid. Continue metoprolol.   EKG showing PACs  Orthostatics pending 11. CAD s/p PTCA/stent: On low dose ASA, metoprolol and lipitor  12. GERD: Continue Protonix.  13. Hypokalemia: Improved after KCL runs. Multifactorial--D/Ced IVF. Added supplement as on diuretic. Encourage intake.   K+ 4.5 on 7/14 14. Impaired left eye closure: Eye drops qid and patch eye at bedtime.  14. Diarrhea: Discontinued colace 15. Hypoalbuminemia  Supplement initiated 7/12 16. ABLA  Hb 11.2 on 7/12  Cont to monitor 17. Leukocytosis  Likely steroid induced  WBCs 15.8 on 7/14  Afebrile 18. Hyperglycemia  Steroid induced  Relatively controlled 7/17  Cont to monitor  LOS (Days) 6 A FACE TO FACE EVALUATION WAS PERFORMED  Kimberly Vaughn 09/15/2016 11:40 AM

## 2016-09-15 NOTE — Progress Notes (Signed)
Physical Therapy Note  Patient Details  Name: Kimberly Vaughn MRN: [id number] Date of Birth: 12-29-1945 Today's Date: 09/15/2016    Time: [phone] 72 minutes  1:1 No c/o pain.  W/c mobility with UEs with supervision 150'.  Gait with RW 75' x 2 with min A for turns, cues for slowing down with turning.  Gait with obstacle negotiation with min A for stepping over and around obstacles.  Picking up objects from the floor with supervision, no increase in dizziness symptoms with bending over.  Standing ball toss and ball kick without AD with min/mod A for standing balance due to LOB to Lt.  Co-tx with TR for conversations about d/c planning and return to leisure activities.  [name] present for session.   DONAWERTH,KAREN 09/15/2016, 10:59 AM

## 2016-09-16 ENCOUNTER — Inpatient Hospital Stay (HOSPITAL_COMMUNITY): Payer: Medicare Other

## 2016-09-16 ENCOUNTER — Other Ambulatory Visit: Payer: Self-pay | Admitting: Radiation Oncology

## 2016-09-16 ENCOUNTER — Inpatient Hospital Stay (HOSPITAL_COMMUNITY): Payer: Medicare Other | Admitting: Occupational Therapy

## 2016-09-16 ENCOUNTER — Inpatient Hospital Stay (HOSPITAL_COMMUNITY): Payer: Medicare Other | Admitting: Physical Therapy

## 2016-09-16 ENCOUNTER — Encounter (HOSPITAL_COMMUNITY): Payer: Medicare Other | Admitting: Psychology

## 2016-09-16 ENCOUNTER — Telehealth: Payer: Self-pay | Admitting: Urology

## 2016-09-16 ENCOUNTER — Ambulatory Visit
Admit: 2016-09-16 | Discharge: 2016-09-16 | Disposition: A | Discharge status: Home / Self Care | Payer: Medicare Other | Attending: Radiation Oncology | Admitting: Radiation Oncology

## 2016-09-16 ENCOUNTER — Inpatient Hospital Stay (HOSPITAL_COMMUNITY): Payer: Medicare Other | Admitting: Speech Pathology

## 2016-09-16 DIAGNOSIS — I951 Orthostatic hypotension: Secondary | ICD-10-CM

## 2016-09-16 DIAGNOSIS — C717 Malignant neoplasm of brain stem: Secondary | ICD-10-CM

## 2016-09-16 DIAGNOSIS — C8330 Diffuse large B-cell lymphoma, unspecified site: Secondary | ICD-10-CM

## 2016-09-16 DIAGNOSIS — C8339 Diffuse large B-cell lymphoma, extranodal and solid organ sites: Secondary | ICD-10-CM

## 2016-09-16 DIAGNOSIS — C8331 Diffuse large B-cell lymphoma, lymph nodes of head, face, and neck: Secondary | ICD-10-CM

## 2016-09-16 HISTORY — DX: Diffuse large B-cell lymphoma, unspecified site: C83.30

## 2016-09-16 LAB — CBC
HCT: 33.9 % — ABNORMAL LOW (ref 36.0–46.0)
HEMOGLOBIN: 11.9 g/dL — AB (ref 12.0–15.0)
MCH: 33.6 pg (ref 26.0–34.0)
MCHC: 35.1 g/dL (ref 30.0–36.0)
MCV: 95.8 fL (ref 78.0–100.0)
PLATELETS: 290 10*3/uL (ref 150–400)
RBC: 3.54 MIL/uL — AB (ref 3.87–5.11)
RDW: 13.7 % (ref 11.5–15.5)
WBC: 14.1 10*3/uL — ABNORMAL HIGH (ref 4.0–10.5)

## 2016-09-16 LAB — BASIC METABOLIC PANEL
Anion gap: 7 (ref 5–15)
BUN: 22 mg/dL — AB (ref 6–20)
CHLORIDE: 101 mmol/L (ref 101–111)
CO2: 27 mmol/L (ref 22–32)
CREATININE: 0.68 mg/dL (ref 0.44–1.00)
Calcium: 8.9 mg/dL (ref 8.9–10.3)
Glucose, Bld: 140 mg/dL — ABNORMAL HIGH (ref 65–99)
POTASSIUM: 4.4 mmol/L (ref 3.5–5.1)
SODIUM: 135 mmol/L (ref 135–145)

## 2016-09-16 NOTE — Progress Notes (Signed)
Social Work Patient ID: Kimberly Vaughn, female   DOB: June 29, 1945, 71 y.o.   MRN: [id number]   Have reviewed team conference with pt and daughter, Jeani Hawking.  Both aware and agreeable with targeted d/c date of 7/24 and mod independent goals.  Pleased with progress to date and hopeful to get a report from oncology dept soon as well.  Will continue to follow.  Jasir Rother, LCSW

## 2016-09-16 NOTE — Patient Care Conference (Signed)
Inpatient RehabilitationTeam Conference and Plan of Care Update Date: 09/16/2016   Time: 2:40 PM    Patient Name: Kimberly Vaughn [facility]      Medical Record Number: [id number]  Date of Birth: 06/27/1945 Sex: Female         Room/Bed: 4W22C/4W22C-01 Payor Info: Payor: [employer] MEDICARE / Plan: [facility] MEDICARE / Product Type: *No Product type* /    Admitting Diagnosis: Brain Tumor Resection  Admit Date/Time:  09/09/2016  7:23 PM Admission Comments: No comment available   Primary Diagnosis:  Brain mass Principal Problem: Brain mass  Patient Active Problem List   Diagnosis Date Noted  . CNS lymphoma ([place])   . Diffuse large B cell lymphoma ([place]) 09/16/2016  . Orthostatic hypotension   . Premature atrial complex   . Steroid-induced hyperglycemia   . Labile blood pressure   . Dysphagia   . PVC (premature ventricular contraction)   . Hypoalbuminemia due to protein-calorie malnutrition ([place])   . Leucocytosis   . Brain mass 09/09/2016  . Ataxia   . Benign essential HTN   . PAF (paroxysmal atrial fibrillation) ([place])   . Gastroesophageal reflux disease   . Diarrhea   . History of MI (myocardial infarction)   . Acute blood loss anemia   . Hypokalemia   . Post-operative pain   . Mass of brain 09/04/2016  . Brain tumor ([place]) 09/04/2016  . Coronary artery disease 09/01/2016  . Paroxysmal atrial fibrillation ([place]) 09/01/2016  . Essential hypertension 09/01/2016  . BPPV (benign paroxysmal positional vertigo) 12/25/2015    Expected Discharge Date: Expected Discharge Date: 09/22/16  Team Members Present: Physician leading conference: Dr. Delice Lesch Social Worker Present: Lennart Pall, LCSW Nurse Present: Other (comment) Felizardo Hoffmann Troxler, RN) PT Present: Kem Parkinson, PT OT Present: [name], OT SLP Present: Windell Moulding, SLP PPS Coordinator present : Daiva Nakayama, RN, CRRN     Current Status/Progress Goal Weekly Team Focus  Medical   Left visual deficits, balance deficits  with ataxic gait and issues secondary to facial/vestibularcochlear nerve mass extending into brachium pontis.  Improve mobility, safety, BP, leukocytosis, hypokalemia  See above   Bowel/Bladder   continent         Swallow/Nutrition/ Hydration   dys 3, thin liquids   mod I   use of swallowing precautions; f/u barium swallow study for assessment of GI function, trials of advanced textures    ADL's   min guard bathing, min assist transfers with RW  Mod I overall  balance, LUE NMR, home making tasks   Mobility   min A for gait with RW and transfers  mod I overall  balance, gait, community mobility   Communication   supervision for intelligibility   mod I   education and carryover of compensatory strategies    Safety/Cognition/ Behavioral Observations            Pain   n/a         Skin   bruising to arms, face/neck lft sidep; insicion lft head  no new skin breakdown and bruising resolving  assess skin q shift    Rehab Goals Patient on target to meet rehab goals: Yes *See Care Plan and progress notes for long and short-term goals.     Barriers to Discharge  Current Status/Progress Possible Resolutions Date Resolved   Physician    Other (comments)  Hyperglycemia, leukocytosis, hypokalemia, HTN  See above  Therapies, follow lab,s supplementing K+, otpimize BP meds      Nursing  Decreased caregiver support  Report from brothers, has not heard from spouse in a week, he started a new job with her brother. Dtr will take care of things/appoints, etc            PT                    OT Decreased caregiver support;Pending chemo/radiation  Pt reports she lives with husband but they "run separate households" - needing to be Mod I.  Plan to begin radiation soon after d/c             SLP Pending chemo/radiation;Decreased caregiver support              SW                Discharge Planning/Teaching Needs:  Plan home with spouse, however, she does not anticipate much assistance from him.  Daughter can provide intermittent support.  NA if able to reach mod ind goals   Team Discussion:  BP under control;  White count up likely due to steroids.  Some sensation returning in her face.  Hopeful that onc MDs to round today to provide pt further info on diagnosis and treatment plan.  Supervision to min/ guard currently with mod ind goals.    Revisions to Treatment Plan:  none    Continued Need for Acute Rehabilitation Level of Care: The patient requires daily medical management by a physician with specialized training in physical medicine and rehabilitation for the following conditions: Daily direction of a multidisciplinary physical rehabilitation program to ensure safe treatment while eliciting the highest outcome that is of practical value to the patient.: Yes Daily medical management of patient stability for increased activity during participation in an intensive rehabilitation regime.: Yes Daily analysis of laboratory values and/or radiology reports with any subsequent need for medication adjustment of medical intervention for : Neurological problems;Post surgical problems;Other;Blood pressure problems  Meredeth Furber, [place] 09/17/2016, 3:13 PM

## 2016-09-16 NOTE — Progress Notes (Signed)
[place] PHYSICAL MEDICINE & REHABILITATION     PROGRESS NOTE  Subjective/Complaints:  Pt seen sitting up in bed this AM.  She slept well overnight.  She notes improvement in sensation.    ROS: Denies nausea, vomiting, diarrhea, shortness of breath or chest pain   Objective: Vital Signs: Blood pressure 126/68, pulse 70, temperature 98.2 F (36.8 C), temperature source Oral, resp. rate 18, height 5\' 1"  (1.549 m), weight 78.7 kg (173 lb 9.6 oz), SpO2 99 %. 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 Dysphagia, with frequent/constant belching when swallowing. EXAM: ESOPHOGRAM/BARIUM SWALLOW TECHNIQUE: Single contrast examination was performed using  thin barium. FLUOROSCOPY TIME:  Fluoroscopy Time:  1 minutes 14 seconds Radiation Exposure Index (if provided by the fluoroscopic device): Number of Acquired Spot Images: 0 COMPARISON:  Chest CT 07/23/2016. FINDINGS: A single contrast study was undertaken and the patient tolerated this well. No obstruction to the forward flow of contrast throughout the esophagus and into the stomach. Normal esophageal course and contour. Normal esophageal mucosal pattern. Overall normal esophageal course and contour. Occasional tertiary contractions occurred, but with a fairly typical appearance for this age group. Normal gastroesophageal junction. A 12.5 mm barium tablet was administered and passed to the stomach without significant delay. The patient did exhibit frequent belching with each swallow. The etiology for this is unclear. IMPRESSION: Essentially normal for age esophagram. Negative for esophageal stricture or obstruction, and a 12.5 mm barium tablet was able to pass to the stomach. Electronically Signed   By: Genevie Ann M.D.   On: 09/15/2016 10:40    Recent Labs  09/16/16 0455  WBC 14.1*  HGB  11.9*  HCT 33.9*  PLT 290    Recent Labs  09/16/16 0455  NA 135  K 4.4  CL 101  GLUCOSE 140*  BUN 22*  CREATININE 0.68  CALCIUM 8.9   CBG (last 3)  No results for input(s): GLUCAP in the last 72 hours.  Wt Readings from Last 3 Encounters:  09/13/16 78.7 kg (173 lb 9.6 oz)  09/04/16 84.2 kg (185 lb 10 oz)  09/01/16 76.7 kg (169 lb)    Physical Exam:  BP 126/68 (BP Location: Left Arm)   Pulse 70   Temp 98.2 F (36.8 C) (Oral)   Resp 18   Ht 5\' 1"  (1.549 m)   Wt 78.7 kg (173 lb 9.6 oz)   SpO2 99%   BMI 32.80 kg/m  Constitutional: She appears well-developed. Obese  HENT: Normocephalic. Left crani incision healing  Eyes: No discharge.  Left eye medially deviated, improving  Neck: Normal range of motion. Neck supple.  Cardiovascular: RRR. No JVD. Respiratory: CTA Bilaterally. Normal effort   GI: Soft. Bowel sounds are normal.   Musculoskeletal: She exhibits no edema or tenderness.  Neurological: She is alert and oriented.  Significant facial weakness on left with sensory deficits, improving.  Mild dysarthria  Able to follow one and two step commands without difficulty.  LUE with ataxia.  Motor: RUE/RLE: 5/5 proximal to distal LUE/LLE: 5/5 proximal to distal (stable) Skin: Skin is warm and dry.  Psychiatric: She has a normal mood and affect. Her behavior is normal. Judgment and thought content normal.    Assessment/Plan: 1. 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 Physiatrist is providing close team supervision and 24 hour management of active medical problems listed below. Physiatrist and rehab team continue to assess barriers to discharge/monitor patient progress toward functional and medical goals.  Function:  Bathing Bathing position   Position: Shower  Bathing parts Body parts bathed by  patient: Right arm, Left arm, Chest, Abdomen, Front perineal area, Buttocks, Right upper leg, Left upper leg, Right lower leg, Left lower leg, Back    Bathing assist Assist Level: Supervision or verbal cues      Upper Body Dressing/Undressing Upper body dressing   What is the patient wearing?: Pull over shirt/dress     Pull over shirt/dress - Perfomed by patient: Thread/unthread right sleeve, Thread/unthread left sleeve, Put head through opening, Pull shirt over trunk          Upper body assist Assist Level: More than reasonable time   Set up : To obtain clothing/put away  Lower Body Dressing/Undressing Lower body dressing   What is the patient wearing?: Underwear, Pants, Shoes, [employer]  - Performed by patient: Thread/unthread right underwear leg, Thread/unthread left underwear leg, Pull underwear up/down Underwear - Performed by helper: Pull underwear up/down Pants- Performed by patient: Thread/unthread right pants leg, Thread/unthread left pants leg, Pull pants up/down Pants- Performed by helper: Pull pants up/down     Socks - Performed by patient: Don/doff right sock, Don/doff left sock   Shoes - Performed by patient: Don/doff right shoe, Don/doff left shoe, Fasten right, Fasten left       TED Hose - Performed by patient: Don/doff right TED hose, Don/doff left TED hose TED Hose - Performed by helper: Don/doff right TED hose, Don/doff left TED hose  Lower body assist Assist for lower body dressing: Supervision or verbal cues   Set up : Don/doff TED stockings  Toileting Toileting   Toileting steps completed by patient: Adjust clothing prior to toileting, Performs perineal hygiene, Adjust clothing after toileting Toileting steps completed by helper: Adjust clothing prior to toileting, Adjust clothing after toileting (per [place], NT assist with clothing) Toileting Assistive Devices: Grab bar or rail  Toileting assist Assist level: Supervision or verbal cues    Transfers Chair/bed transfer   Chair/bed transfer method: Ambulatory Chair/bed transfer assist level: Supervision or verbal cues Chair/bed transfer assistive device: Walker, Air cabin crew     Max distance: 10 Assist level: Moderate assist (Pt 50 - 74%)   Wheelchair   Type: Manual Max wheelchair distance: 150 Assist Level: Supervision or verbal cues  Cognition Comprehension Comprehension assist level: Follows complex conversation/direction with no assist  Expression Expression assist level: Expresses complex 90% of the time/cues < 10% of the time  Social Interaction Social Interaction assist level: Interacts appropriately with others - No medications needed.  Problem Solving Problem solving assist level: Solves complex problems: With extra time  Memory Memory assist level: More than reasonable amount of time    Medical Problem List and Plan: 1.   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  Cont CIR 2.  DVT Prophylaxis/Anticoagulation: Mechanical: Sequential compression devices, below knee Bilateral lower extremities and mobility.  3. Pain Management: hydrocodone prn effective at present.  4. Mood: LCSW to follow for evaluation and support.  5. Neuropsych: This patient is capable of making decisions on her own behalf. 6. Skin/Wound Care: Monitor incision for healing. Maintain adequate nutritional and hydration status.  7. Fluids/Electrolytes/Nutrition:  Monitor I/Os  D3 thin, advance as tolerated, after repeat MBS as well 8. HTN: Monitor BP bid. Continue HCTZ and metoprolol  Controlled 7/18 9. Vestibular symptoms: Vestibular eval and treatment.  10. PAF: Monitor HR bid. Continue metoprolol.   EKG showing PACs  Orthostatic hypotension 11. CAD s/p PTCA/stent: On low dose ASA, metoprolol and lipitor  12. GERD: Continue Protonix.  13. Hypokalemia:  Improved after KCL runs. Multifactorial--D/Ced IVF. Added supplement as on diuretic. Encourage intake.   K+ 4.4 on 7/18 14. Impaired left eye closure: Eye drops qid and patch eye at bedtime.  14. Diarrhea: Discontinued colace 15. Hypoalbuminemia  Supplement initiated 7/12 16. ABLA  Hb 11.9 on 7/18  Cont to monitor 17. Leukocytosis  Likely steroid induced  WBCs 14.1 on 7/18  Afebrile 18. Hyperglycemia  Steroid induced  Relatively controlled 7/18  Cont to monitor  LOS (Days) 7 A FACE TO FACE EVALUATION WAS PERFORMED  Gurshaan Matsuoka Lorie Phenix 09/16/2016 12:50 PM

## 2016-09-16 NOTE — Progress Notes (Signed)
   09/16/16 0848  Vestibular Assessment  General Observation Eye alignment off, slightly adducted on the right.  Pt wears reading glassess, significant hearing loss left ear, no ringing in the ears, no recent head trauma.  Blurry vision reported in left eye and inability to close her eye lid.    Symptom Behavior  Type of Dizziness Imbalance  Frequency of Dizziness with movement  Duration of Dizziness near constant feeling of "bobble headed".  Aggravating Factors Activity in general  Relieving Factors Lying supine;Head stationary  Occulomotor Exam  Occulomotor Alignment Abnormal  Spontaneous Absent  Gaze-induced Right beating nystagmus with R gaze  Head shaking Horizontal R beating nystagmus  Smooth Pursuits Comment (right beating nystagmus)  Saccades Comment (overshooting both vertically and horizontally)  Vestibulo-Occular Reflex  VOR 1 Head Only (x 1 viewing) x1 difficult and symptomatic producing LOB in sitting without hand to hold bed rail for balance.  Pt reports this is symptomatic and difficult to do without slowing down speed.  Horizontal more symptomatic and difficult than vertical.   VOR Cancellation Unable to maintain gaze  Auditory  Comments cannot hear at all out of left ear.   Positional Testing  Dix-Hallpike Dix-Hallpike Right;Dix-Hallpike Left  Sidelying Test Sidelying Right;Sidelying Left  Horizontal Canal Testing Horizontal Canal Right;Horizontal Canal Left  Dix-Hallpike Right  Dix-Hallpike Right Duration na  Dix-Hallpike Right Symptoms No nystagmus  Dix-Hallpike Left  Dix-Hallpike Left Duration na  Dix-Hallpike Left Symptoms No nystagmus  Sidelying Right  Sidelying Right Duration na  Sidelying Right Symptoms Right nystagmus (right nystagmus, but false positive as she is looking right )  Sidelying Left  Sidelying Left Duration na  Sidelying Left Symptoms No nystagmus  Horizontal Canal Right  Horizontal Canal Right Duration na  Horizontal Canal Right  Symptoms Normal  Horizontal Canal Left  Horizontal Canal Left Duration na  Horizontal Canal Left Symptoms Normal  Cognition  Cognition Comment not specifically tested  Positional Sensitivities  Positional Sensitivities Comments we practiced bending over in sitting to get something off of the floor.  Pt felt better doing it faster, slower was more symptomatic.  Had to have a hand on the bed rail for balance.   Orthostatics  Orthostatics Comment See orthostatic BPs (+)  09/16/2016 Pt with (+) central vestibular dysfunction likely due to the nature of her surgery affecting the 7th and more impactful 8th (vestibulococlear) cranial nerve.  Her nystagmus is consistent with a malfunctioning left vestibular system, and none of the peripheral testing was positive.  In this situation compensatory strategies (target finding, segmental turning) and x1 progressive exercises should be initiated to help habituate/train her vestibular system to quiet or diminish her "bobble head" symptoms.  HEP x1 seated horizontal and vertical exercises given and reviewed with pt.  Orthostatic BPs taken (all except supine as pt was already up when PT entered the room).    09/16/16 0748  Vital Signs  Patient Position (if appropriate) Orthostatic Vitals  Orthostatic Lying   BP- Lying (pt already up)  Orthostatic Sitting  BP- Sitting 131/65  Pulse- Sitting 82  Orthostatic Standing at 0 minutes  BP- Standing at 0 minutes 107/77  Pulse- Standing at 0 minutes 95  Orthostatic Standing at 3 minutes  BP- Standing at 3 minutes (!) 88/73  Pulse- Standing at 3 minutes 95  Pain Assessment  Pain Assessment No/denies pain  Pain Score 0  Oxygen Therapy  SpO2 99 %  O2 Device Room [employer] B. [place], [place], DPT [phone]

## 2016-09-16 NOTE — Progress Notes (Addendum)
Radiation Oncology is aware of the patient's case which was discussed in detail at recent multidisciplinary brain conference.  Recommendations were for medical oncology consult to determine the role for systemic chemotherapy in the treatment and management of her disease. If patient is not deemed a good candidate for systemic chemotherapy, radiotherapy could be considered.  In that event, we would anticipate offering whole brain radiotherapy over a course of 10-25 daily treatments.  We will await final recommendations from medical oncology regarding patient candidacy for systemic therapy and/or the need for radiotherapy.  Medical oncology will contact us in the event that chemotherapy is not indicated and we are happy to provide a formal consult at that time and participate in her care as deemed most appropriate. I will call to share this information with the patient and her daughter, Jeani Hawking.   Nicholos Johns, PA-C

## 2016-09-16 NOTE — Consult Note (Signed)
Reason for Referral: CNS lymphoma.   HPI: 71 year old woman currently resides in [place] where she lived the majority of her life. She has a history of coronary artery disease, atrial fibrillation among few other comorbid conditions. She started developing hearing deficits in November 2017. She subsequently started developing facial droop and her initial workup was unrevealing but subsequently had an MRI in May 2018 which showed a masslike enhancement at the origin of the seventh of the eighth cranial nerves causing enhancements of her seventh and eighth nerve. She subsequently was evaluated by neurosurgery and the primary surgical therapy was delayed because of her cardiac stent placement in January 2018. On 09/04/2016 she underwent craniotomy and resection of her brainstem tumor. The final pathology revealed a diffuse large cell lymphoma as the final pathology. Imaging studies of the chest abdomen and pelvis did not show any malignancy. She is currently receiving acute rehabilitation and has noticed improvement in her facial droop. She is ambulating without difficulties and feels that her strength has improved. She is able to swallow better today after a period of dysphagia. Her hearing although has not resumed.  She does not report any headaches, blurry vision, syncope or seizures. She does not report any fevers or chills or sweats. Appetite has been reasonable. She does not report any chest pain, palpitation, orthopnea or leg edema. She does not report any cough, wheezing or hemoptysis. She does not report any nausea, vomiting or abdominal pain. She is not report any frequency urgency or hesitancy. She does not report any skeletal complaints. Remaining review of systems unremarkable.   Past Medical History:  Diagnosis Date  . A-fib ([facility])   . Anginal pain ([place])   . Asthma    as a child  . Coronary artery disease   . DDD (degenerative disc disease), lumbar   . Degenerative joint disease   .  Dizziness and giddiness   . Dysrhythmia    A fib  . Episodic atrial fibrillation ([place])   . Esophageal reflux   . GERD (gastroesophageal reflux disease)   . Hiatal hernia   . History of kidney stones   . Hypertension, essential   . Kidney stones   . Liver cyst LEFT  . Myocardial infarction ([place])   . Nodule of parotid gland   . Pneumonia   . PONV (postoperative nausea and vomiting)   . Pulmonary nodule   . PVC (premature ventricular contraction)    Nov 2017 & Feb 2018 noted on cardiac monitor worn at home  :  Past Surgical History:  Procedure Laterality Date  . APPLICATION OF CRANIAL NAVIGATION N/A 09/04/2016   Procedure: APPLICATION OF CRANIAL NAVIGATION;  Surgeon: Consuella Lose, MD;  Location: [facility];  Service: Neurosurgery;  Laterality: N/A;  . BRAIN SURGERY  09/04/2016  . BREAST BIOPSY Bilateral   . CARPAL TUNNEL RELEASE Right 09/2013  . CATARACTS Bilateral   . CERVICAL SPINE SURGERY  04/2012  . FINGER SURGERY Right    TRIGGER FINGER RELEASE  . PARTIAL THYMECTOMY  1973   pt states doesn't remember this being done  . RETROSIGMOID CRANIECTOMY FOR TUMOR RESECTION Left 09/04/2016   Procedure: RETROSIGMOID CRANIECTOMY FOR TUMOR RESECTION WITH BRAINLAB NAVIGATION;  Surgeon: Consuella Lose, MD;  Location: [facility];  Service: Neurosurgery;  Laterality: Left;  CRANIOTOMY TUMOR EXCISION  . TONSILLECTOMY  1964  . VAGINAL HYSTERECTOMY  1974  :   Current Facility-Administered Medications:  .  acetaminophen (TYLENOL) tablet 325-650 mg, 325-650 mg, Oral, Q4H PRN, Love, Ivan Anchors,  PA-C .  alum & mag hydroxide-simeth (MAALOX/MYLANTA) 200-200-20 MG/5ML suspension 30 mL, 30 mL, Oral, Q4H PRN, Love, Pamela S, PA-C .  artificial tears (LACRILUBE) ophthalmic ointment, , Left Eye, QHS, Love, Pamela S, PA-C .  aspirin EC tablet 81 mg, 81 mg, Oral, Daily, Love, Pamela S, PA-C, 81 mg at 09/16/16 1006 .  atorvastatin (LIPITOR) tablet 80 mg, 80 mg, Oral, q1800, Love, Pamela S, PA-C, 80 mg at  09/15/16 1729 .  bisacodyl (DULCOLAX) suppository 10 mg, 10 mg, Rectal, Daily PRN, Love, Pamela S, PA-C .  cholecalciferol (VITAMIN D) tablet 2,000 Units, 2,000 Units, Oral, Daily, Love, Pamela S, PA-C, 2,000 Units at 09/16/16 1005 .  dexamethasone (DECADRON) tablet 4 mg, 4 mg, Oral, Q8H, Love, Pamela S, PA-C, 4 mg at 09/16/16 1528 .  diphenhydrAMINE (BENADRYL) 12.5 MG/5ML elixir 12.5-25 mg, 12.5-25 mg, Oral, Q6H PRN, Love, Pamela S, PA-C .  feeding supplement (ENSURE ENLIVE) (ENSURE ENLIVE) liquid 237 mL, 237 mL, Oral, Q1500, Patel, Domenick Bookbinder, MD .  guaiFENesin-dextromethorphan (ROBITUSSIN DM) 100-10 MG/5ML syrup 5-10 mL, 5-10 mL, Oral, Q6H PRN, Love, Pamela S, PA-C .  hydrochlorothiazide (HYDRODIURIL) tablet 12.5 mg, 12.5 mg, Oral, q morning - 10a, Love, Pamela S, PA-C, 12.5 mg at 09/16/16 1012 .  HYDROcodone-acetaminophen (NORCO/VICODIN) 5-325 MG per tablet 1 tablet, 1 tablet, Oral, Q4H PRN, Love, Pamela S, PA-C .  meclizine (ANTIVERT) tablet 25 mg, 25 mg, Oral, TID PRN, Love, Pamela S, PA-C .  metoprolol tartrate (LOPRESSOR) tablet 25 mg, 25 mg, Oral, BID, Love, Pamela S, PA-C, 25 mg at 09/16/16 1005 .  naphazoline-glycerin (CLEAR EYES) ophth solution 1-2 drop, 1-2 drop, Left Eye, TID WC & HS, Love, Pamela S, PA-C, 2 drop at 09/16/16 1202 .  nitroGLYCERIN (NITROSTAT) SL tablet 0.4 mg, 0.4 mg, Sublingual, Q5 min PRN, Love, Pamela S, PA-C .  pantoprazole (PROTONIX) EC tablet 80 mg, 80 mg, Oral, QAC breakfast, Love, Pamela S, PA-C, 80 mg at 09/16/16 1006 .  polyethylene glycol (MIRALAX / GLYCOLAX) packet 17 g, 17 g, Oral, Daily PRN, Love, Pamela S, PA-C .  [COMPLETED] potassium chloride SA (K-DUR,KLOR-CON) CR tablet 20 mEq, 20 mEq, Oral, TID, 20 mEq at 09/11/16 1437 **FOLLOWED BY** potassium chloride SA (K-DUR,KLOR-CON) CR tablet 20 mEq, 20 mEq, Oral, BID, Love, Pamela S, PA-C, 20 mEq at 09/16/16 1006 .  prochlorperazine (COMPAZINE) tablet 5-10 mg, 5-10 mg, Oral, Q6H PRN **OR** prochlorperazine  (COMPAZINE) injection 5-10 mg, 5-10 mg, Intramuscular, Q6H PRN **OR** prochlorperazine (COMPAZINE) suppository 12.5 mg, 12.5 mg, Rectal, Q6H PRN, Love, Pamela S, PA-C .  sodium phosphate (FLEET) 7-19 GM/118ML enema 1 enema, 1 enema, Rectal, Once PRN, Love, Pamela S, PA-C .  traZODone (DESYREL) tablet 25-50 mg, 25-50 mg, Oral, QHS PRN, Love, Pamela S, PA-C .  vitamin B-12 (CYANOCOBALAMIN) tablet 5,000 mcg, 5,000 mcg, Oral, Daily, Love, Pamela S, PA-C, 5,000 mcg at 09/16/16 1005:  Allergies  Allergen Reactions  . No Known Allergies Other (See Comments)  :  Family History  Problem Relation Age of Onset  . Heart disease Mother 57  . Psoriasis Mother        PUSTULAR  . Heart attack Father 31  . Heart disease Father   . Lymphoma Sister   . Melanoma Brother   . Hypertension Daughter   . Lymphoma Other   . Osteoarthritis Other   . Melanoma Other   . Hypertension Other   :  Social History   Social History  . Marital status: Married    Spouse  name: N/A  . Number of children: 1  . Years of education: 27   Occupational History  . Retired    Social History Main Topics  . Smoking status: Former Smoker    Types: Cigarettes    Quit date: 10/23/2005  . Smokeless tobacco: Never Used  . Alcohol use No  . Drug use: No  . Sexual activity: Not Currently     Comment: MARRIED   Other Topics Concern  . Not on file   Social History Narrative   Lives with husband   Caffeine use: 2 cups coffee per day  :  Pertinent items are noted in HPI.  Exam: Blood pressure (!) 123/51, pulse 64, temperature 97.7 F (36.5 C), temperature source Oral, resp. rate 18, height 5\' 1"  (1.549 m), weight 173 lb 9.6 oz (78.7 kg), SpO2 97 %.  ECOG 1  General appearance: alert and cooperative appeared without distress. Throat: No oral thrush noted. Neck: no adenopathy Back: negative Resp: clear to auscultation bilaterally Cardio: regular rate and rhythm, S1, S2 normal, no murmur, click, rub or gallop GI:  soft, non-tender; bowel sounds normal; no masses,  no organomegaly Extremities: extremities normal, atraumatic, no cyanosis or edema Pulses: 2+ and symmetric Skin: Skin color, texture, turgor normal. No rashes or lesions Lymph nodes: Cervical, supraclavicular, and axillary nodes normal. Neurologic: Left-sided facial droop noted. Good motor strength in her upper and lower extremities.    Recent Labs  09/16/16 0455  WBC 14.1*  HGB 11.9*  HCT 33.9*  PLT 290    Recent Labs  09/16/16 0455  NA 135  K 4.4  CL 101  CO2 27  GLUCOSE 140*  BUN 22*  CREATININE 0.68  CALCIUM 8.9     Mr Jeri Cos Wo Contrast  Result Date: 09/05/2016 CLINICAL DATA:  Initial evaluation status post left retro sigmoid craniectomy for tumor resection. EXAM: MRI HEAD WITHOUT AND WITH CONTRAST TECHNIQUE: Multiplanar, multiecho pulse sequences of the brain and surrounding structures were obtained without and with intravenous contrast. CONTRAST:  36mL MULTIHANCE GADOBENATE DIMEGLUMINE 529 MG/ML IV SOLN COMPARISON:  Prior MRI from 08/20/2016. FINDINGS: Brain: Postoperative changes from interval left retro sigmoid craniotomy for resection of mass centered at the left cerebellopontine angle seen. Scattered foci of susceptibility artifact overlying the bilateral cerebral convexities most consistent with postoperative pneumocephalus. Trace layering fluid within the occipital horns of the lateral ventricles likely a small amount of postoperative subarachnoid blood. No significant extra-axial collections. There has been subtotal resection of previously seen mass centered at the left CP angle. Resection cavity present at the left brachium pontis. Associated T2/FLAIR signal about the resection cavity stable to mildly increased from previous. Peripheral nodular enhancement about the resection cavity consistent with residual tumor (series 12, image 13). Enhancement extends into the left IAC along the cisternal and intracanalicular  segments of the seventh and eighth cranial nerves. Previously described enhancement along the inner ear structures including the geniculate ganglion and descending left seventh cranial nerve not well seen on this non IAC protocol MRI. No significant infarct or other complication identified about the resection cavity. Otherwise stable appearance of the brain. No acute infarct or other mass lesion. No hydrocephalus. No midline shift. Major dural sinuses grossly patent. Vascular: Major intracranial vascular flow voids are maintained. Skull and upper cervical spine: Craniocervical junction within normal limits. Degenerative spondylolysis noted within the upper cervical spine with mild stenosis at C3-4. Bone marrow signal intensity within normal limits. Postoperative changes present at the left retro sigmoid calvarium.  Sinuses/Orbits: Globes and orbital soft tissues within normal limits. Patient status post lens extraction bilaterally. Mild scattered mucosal thickening within the ethmoidal air cells. Small left mastoid effusion. IMPRESSION: 1. Postoperative changes from interval left retro sigmoid craniectomy with subtotal resection of mass centered at the left cerebellopontine angle/brachium pontis. Residual tumor about the resection cavity as above. No complication identified. 2. Otherwise stable appearance of the brain. Electronically Signed   By: Jeannine Boga M.D.   On: 09/05/2016 07:14   Dg Esophagus     Assessment and Plan:    71 year old woman with the following issues:  1. 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 This was confirmed by biopsy following craniotomy done on 09/04/2016. Systemic staging did not show any evidence of disease in the chest, abdomen and pelvis. She has recovered reasonably well from her operation and currently receiving acute rehabilitation. It is anticipated that she'll be discharged on 09/22/2016.  The natural course of this  disease was discussed today with the patient and her daughter. Given the pathology confirming primary CNS lymphoma, the main modality to treat this disease would be systemic therapy. I feel she has a reasonable performance status and adequate organ function that systemic chemotherapy specifically high-dose methotrexate would be a reasonable option in her situation. I believe radiation therapy would be an alternative if systemic chemotherapy cannot be given or she is intolerant to it.  The logistics of administration of chemotherapy with very to a single agent methotrexate or combination of agents was discussed today with the patient and her daughter. She lives in [place] and I gave her the option to follow-up locally versus [facility] or referral to [facility]. After discussion today, I have recommended referral to [facility] for evaluation and treatment of her disease. She feels comfortable with this recommendation and we will arrange Follow-up for her upon her discharge. She understands that she might require hospitalization to receive systemic therapy especially if high-dose methotrexate is the treatment of choice at that time.  All her questions were answered today to her satisfaction.  2. Follow-up: We will arrange referral to [facility] after her discharge on 09/22/2016.

## 2016-09-16 NOTE — Consult Note (Signed)
Neuropsychological Consultation   Patient:   Kimberly Vaughn   DOB:   1945-11-04  MR Number:  [id number]  Location:  [facility] [address] [id number] [place] [address] Dept: [place]: [phone]           Date of Service:   09/16/2016  Start Time:   10 AM End Time:   11 AM  Provider/Observer:  Kimberly Vaughn, Psy.D.       Clinical Neuropsychologist       Billing Code/Service: [phone] 4 Units  Chief Complaint:    Kimberly Vaughn is a 71 year old female who has experienced progressive hearing loss and changes in facial muscles beginning in November 2017. 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 The patient also had a heart attack requiring stent in January 2018 which postponed surgery for what is now been identified as a brainstem nodule/tumor.  The patient has been coping with the very recent dx of cancer and the implications and course this will take.  Reason for Service:   Below is the HPI for the current admission.  The patient was referred for neuropsychological consultation due to coping and adjustment issues around resent Dx.  HPI:  Kimberly Vaughn [place] a 71 y.o.femalewith progressive left hearing loss and facial droop since 01/2016 with diagnosis of cochlear nerve complex affecitng seventh and eight nerve. History taken from chart review and patient. Patient with CAD and MI requiring stent 03/2016 therefore surgery postponed with close observation. Follow up MRI showed marked enhancement of brainstem nodule with left facial numbness involving all three distributions of trigeminal nerve and concerns of malignancy and she was cleared for surgery by cards. She was admitted on 09/04/16 for retrosigmoid craniectomy for tumor resection by Dr. Kathyrn Sheriff. Swallow evaluation done due to significant facial weakness and numbness--regular,thins with chin tuck  recommended to prevent aspiration/penetration. Post-op MRI reviewed, showing partial resection of brainstem tumor. Therapy ongoing and patient limited by left visual deficits, balance deficits with ataxic gait and issues with intermittent dizziness/nausea with activity.  CIR recommended due to deficits in mobility and ability to carry out ADL tasks  Current Status:  The patient appears to be coping better now that the PA has been able to speak with her about type of tumor.  While the medical information was not good, the patient reports that just knowing has really helped to deal with what she expected to be true issues she would have to deal with.  Behavioral Observation: Kimberly Vaughn  presents as a 71 y.o.-year-old Right Caucasian Female who appeared her stated age. her dress was Appropriate and she was Well Groomed and her manners were Appropriate to the situation.  her participation was indicative of Appropriate and Attentive behaviors.  There were physical disabilities noted related to facial muscles.  she displayed an appropriate level of cooperation and motivation.     Interactions:    Active Appropriate and Attentive  Attention:   within normal limits and attention span and concentration were age appropriate  Memory:   within normal limits; recent and remote memory intact  Visuo-spatial:  within normal limits  Speech (Volume):  normal  Speech:   slurred; Due to facial muscles and facial droop  Thought Process:  Coherent and Relevant  Though Content:  WNL; not suicidal and not homicidal  Orientation:   person, place, time/date and situation  Judgment:   Good  Planning:  Good  Affect:    Appropriate  Mood:    NA  Insight:   Good  Intelligence:   normal  Marital Status/Living: The patient is married and living with husband.  Daughter is active in her life.  Husband may be showing early signs of dementia and at least has significant hearing loss.  Current  Employment: Patient not working  Medical History:   Past Medical History:  Diagnosis Date  . A-fib ([place])   . Anginal pain ([place])   . Asthma    as a child  . Coronary artery disease   . DDD (degenerative disc disease), lumbar   . Degenerative joint disease   . Dizziness and giddiness   . Dysrhythmia    A fib  . Episodic atrial fibrillation ([place])   . Esophageal reflux   . GERD (gastroesophageal reflux disease)   . Hiatal hernia   . History of kidney stones   . Hypertension, essential   . Kidney stones   . Liver cyst LEFT  . Myocardial infarction ([place])   . Nodule of parotid gland   . Pneumonia   . PONV (postoperative nausea and vomiting)   . Pulmonary nodule   . PVC (premature ventricular contraction)    Nov 2017 & Feb 2018 noted on cardiac monitor worn at home            Abuse/Trauma History: No reported history of abuse or trauma  Psychiatric History:  No prior history of depression or anxiety.  Family Med/Psych History:  Family History  Problem Relation Age of Onset  . Heart disease Mother 63  . Psoriasis Mother        PUSTULAR  . Heart attack Father 30  . Heart disease Father   . Lymphoma Sister   . Melanoma Brother   . Hypertension Daughter   . Lymphoma Other   . Osteoarthritis Other   . Melanoma Other   . Hypertension Other     Risk of Suicide/Violence: virtually non-existent   Impression/DX:  Kimberly Vaughn is a 71 year old female who has experienced progressive hearing loss and changes in facial muscles beginning in November 2017. 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 The patient also had a heart attack requiring stent in January 2018 which postponed surgery for what is now been identified as a brainstem nodule/tumor.  The patient has been coping with the very recent dx of cancer and the implications and course this will take.  The patient describes a very robust and supportive social network even if her husband  is going to be increasingly unable to provide a great deal of care.          Electronically Signed   _______________________ Kimberly Vaughn, Psy.D.

## 2016-09-16 NOTE — Progress Notes (Signed)
Occupational Therapy Session Note  Patient Details  Name: Kimberly Vaughn MRN: [id number] Date of Birth: Dec 03, 1945  Today's Date: 09/16/2016 OT Individual Time: [phone] and 1345-1430 OT Individual Time Calculation (min): 60 min and 45 min   Short Term Goals: Week 1:  OT Short Term Goal 1 (Week 1): STG = LTGs due to estimated LOS  Skilled Therapeutic Interventions/Progress Updates:    1) Treatment session with focus on functional mobility and dynamic standing balance.  Pt received upright in w/c having just finished vestibular eval, no reports of dizziness.  Pt ambulated around room with RW with supervision to obtain clothing, incorporating reaching and bending without LOB this session.  Dressing completed at sit > stand level with setup and supervision when standing to pull pants over hips.  In therapy gym engaged in dynamic standing balance and fine motor control activity with LUE.  LUE Fine motor control tasks in sitting and standing with focus on in-hand manipulation, translation, and placing items in Lt visual field.  Utilized cup with slot for improved precision and motor control with reaching.  2) Treatment session with focus on ADL retraining with shower at sit > stand level.  Pt ambulated to room shower with RW with supervision.  Completed bathing at sit > stand level with use of grab bar for steady assist when standing and when washing lower legs in Figure 4 position.  Pt with 1 LOB posteriorly when drying buttocks.  Completed dressing with supervision.  Educated pt on improved positioning and placement of RW when reaching down to place items in laundry drawer.  Grooming tasks completed in standing at sink with supervision.  Therapy Documentation Precautions:  Precautions Precautions: Fall Precaution Comments: Does not hear out of L ear; L eye with blurred vision. (but improving) Restrictions Weight Bearing Restrictions: No General:   Vital Signs: Therapy Vitals Patient  Position (if appropriate): Orthostatic Vitals Oxygen Therapy SpO2: 99 % O2 Device: Not Delivered Pain: Pain Assessment Pain Assessment: No/denies pain Pain Score: 0-No pain  See Function Navigator for Current Functional Status.   Therapy/Group: Individual Therapy  [name] 09/16/2016, 9:32 AM

## 2016-09-16 NOTE — Progress Notes (Signed)
Speech Language Pathology Daily Session Note  Patient Details  Name: Kimberly Vaughn MRN: [id number] Date of Birth: 1945-05-13  Today's Date: 09/16/2016 SLP Individual Time: 1305-1330 SLP Individual Time Calculation (min): 25 min  Short Term Goals: Week 1: SLP Short Term Goal 1 (Week 1): Pt will consume therapeutic trials of regular textures with mod I use of swallowing precautions to monitor and correct anterior labial loss of boluses.   SLP Short Term Goal 2 (Week 1): Pt will utilize slow rate and overarticulation to achieve intelligibility at the conversational level with mod I.     Skilled Therapeutic Interventions:  Pt was seen for skilled ST targeting dysphagia goals.  SLP completed skilled observations during presentations of pt's currently prescribed diet.  Pt with intermittent eructation following liquids, much improved from initial evaluation.  Pt utilized swallowing precautions with mod I and demonstrated no overt s/s of aspiration with solids or liquids.  SLP reviewed and reinforced esophageal precautions to minimize reflux symptoms.  Pt was left sitting upright in wheelchair with daughter at bedside.  Continue per current plan of care.       Function:  Eating Eating   Modified Consistency Diet: Yes Eating Assist Level: Swallowing techniques: self managed           Cognition Comprehension Comprehension assist level: Follows complex conversation/direction with no assist  Expression   Expression assist level: Expresses complex 90% of the time/cues < 10% of the time  Social Interaction Social Interaction assist level: Interacts appropriately with others - No medications needed.  Problem Solving Problem solving assist level: Solves complex problems: With extra time  Memory Memory assist level: More than reasonable amount of time    Pain Pain Assessment Pain Assessment: No/denies pain  Therapy/Group: Individual Therapy  Thatcher Doberstein, Selinda Orion 09/16/2016, 2:28 PM

## 2016-09-17 ENCOUNTER — Encounter: Payer: Self-pay | Admitting: *Deleted

## 2016-09-17 ENCOUNTER — Inpatient Hospital Stay (HOSPITAL_COMMUNITY): Payer: Medicare Other | Admitting: Speech Pathology

## 2016-09-17 ENCOUNTER — Inpatient Hospital Stay (HOSPITAL_COMMUNITY): Payer: Medicare Other | Admitting: Occupational Therapy

## 2016-09-17 ENCOUNTER — Ambulatory Visit (HOSPITAL_COMMUNITY): Payer: Medicare Other | Admitting: Physical Therapy

## 2016-09-17 DIAGNOSIS — C8589 Other specified types of non-Hodgkin lymphoma, extranodal and solid organ sites: Secondary | ICD-10-CM

## 2016-09-17 NOTE — Progress Notes (Signed)
Speech Language Pathology Daily Session Note  Patient Details  Name: Lanetta Figuero MRN: [id number] Date of Birth: 11/23/1945  Today's Date: 09/17/2016 SLP Individual Time: 1115-1200 SLP Individual Time Calculation (min): 45 min  Short Term Goals: Week 1: SLP Short Term Goal 1 (Week 1): Pt will consume therapeutic trials of regular textures with mod I use of swallowing precautions to monitor and correct anterior labial loss of boluses.   SLP Short Term Goal 2 (Week 1): Pt will utilize slow rate and overarticulation to achieve intelligibility at the conversational level with mod I.    Skilled Therapeutic Interventions:  Pt was seen for skilled ST targeting communication goals.  Pt utilized increased vocal intensity and overarticulation to achieve intelligibility at the conversational level in a highly distracting environment during both structured and unstructured speech tasks with x1 supervision question cue for repetition to clarify.  Pt was returned to room and left in chair with call bell within reach.  Continue per current plan of care.    Function:  Eating Eating                 Cognition Comprehension Comprehension assist level: Follows complex conversation/direction with no assist  Expression   Expression assist level: Expresses complex 90% of the time/cues < 10% of the time  Social Interaction Social Interaction assist level: Interacts appropriately with others - No medications needed.  Problem Solving Problem solving assist level: Solves complex problems: Recognizes & self-corrects  Memory Memory assist level: More than reasonable amount of time    Pain Pain Assessment Pain Assessment: No/denies pain  Therapy/Group: Individual Therapy  Veryl Winemiller, Selinda Orion 09/17/2016, 4:00 PM

## 2016-09-17 NOTE — Progress Notes (Signed)
Occupational Therapy Weekly Progress Note  Patient Details  Name: Kimberly Vaughn MRN: [id number] Date of Birth: December 26, 1945  Beginning of progress report period: September 10, 2016 End of progress report period: September 17, 2016  Today's Date: 09/17/2016 OT Individual Time: 6384-5364 and 1417-1500 OT Individual Time Calculation (min): 60 min and 43 min   Patient is progressing towards long term goals.  Short term goals not set due to estimated length of stay.  Pt currently min guard - supervision with self-care tasks and ambulation with RW.  Pt requires min guard to min assist when backing up and when reaching outside BOS as required during ADL and IADL tasks.  Pt is demonstrating increased fine and gross motor movements with LUE and reports improvements in vision and sensation in Lt side of face.    Patient continues to demonstrate the following deficits: muscle weakness, unbalanced muscle activation, ataxia and decreased coordination, decreased visual motor skills, and decreased standing balance, decreased postural control and decreased balance strategies and therefore will continue to benefit from skilled OT intervention to enhance overall performance with BADL, iADL and Reduce care partner burden.  See Patient's Care Plan for progression toward long term goals.  Patient progressing toward long term goals..  Continue plan of care.  Skilled Therapeutic Interventions/Progress Updates:    1) Treatment session with focus on dynamic standing balance and balance reactions during functional tasks.  Pt gathered all clothing while ambulating with RW, improved positioning of RW while reaching outside BOS into closet.  LB dressing completed at supervision level when standing, Mod I for grooming with UE support on sink.  Engaged in dynamic standing balance while standing on foam surface, incorporate reaching across midline and out to Lt to challenge motor control with LUE.  Pt continues to demonstrate overshooting  when reaching to Lt, however improves with repetition.  Combined fine motor control with reaching activity incorporating in-hand manipulation and translation.  Supervision with all standing tasks this session.  2) ADL retraining with pt ambulating around room to gather clothing with RW and supervision.  Completed shower transfer with RW with supervision and use of grab bar.  Supervision for bathing with pt utilizing shower chair and grab bars to increase independence and stability.  Pt reports her son plans to install a grab bar in shower and next to toilet.  Dressing completed at sit > stand level with supervision.  Pt with overall improved stability during standing this session and improved timing during self-care tasks.  Therapy Documentation Precautions:  Precautions Precautions: Fall Precaution Comments: Does not hear out of L ear; L eye with blurred vision. (but improving) Restrictions Weight Bearing Restrictions: No General:   Vital Signs: Therapy Vitals Temp: 98 F (36.7 C) Temp Source: Oral Pulse Rate: 63 Resp: 17 BP: (!) 151/53 Patient Position (if appropriate): Lying Oxygen Therapy SpO2: 97 % O2 Device: Not Delivered Pain:  Pt with no c/o pain  See Function Navigator for Current Functional Status.   Therapy/Group: Individual Therapy  [name] 09/17/2016, 7:38 AM

## 2016-09-17 NOTE — Progress Notes (Signed)
Physical Therapy Note  Patient Details  Name: Kimberly Vaughn MRN: [id number] Date of Birth: Jan 04, 1946 Today's Date: 09/17/2016    Time: [phone] 45 minutes  1:1 no c/o pain.  W/c mobility in controlled environment with supervision with bilat UEs.  Gait with RW x 100' with min A for turning, more reliance on UEs when fatigued.  Standing balance activities with rec therapy for swinging a bat both directions and ball kick.  Pt able to use strategies to prevent dizziness with head turns during activities.  Pt min/mod A for balance without AD.   Kimberly Vaughn 09/17/2016, 11:44 AM

## 2016-09-17 NOTE — Telephone Encounter (Signed)
Spoke with Kimberly Vaughn, new patient coordinator @ [facility].  appt with dr lesser is September 24, 2016 @ 10:15, they will mail patient a packet with directions. vanda in radiology to make scans visible on epic for dr lesser's viewing. Request sent to [facility] for slides to be fed-exed. Notified patient and caregiver.

## 2016-09-17 NOTE — Progress Notes (Signed)
Physical Therapy Weekly Progress Note  Patient Details  Name: Kimberly Vaughn MRN: [id number] Date of Birth: 1945/06/06  Beginning of progress report period: September 10, 2016 End of progress report period: September 17, 2016   Patient is progressing towards long term goals of mod I for gait and mobility.  Pt currently min A with gait with RW, supervision with transfers.  Patient continues to demonstrate the following deficits muscle weakness and decreased standing balance, decreased postural control and decreased balance strategies and therefore will continue to benefit from skilled PT intervention to increase functional independence with mobility.  Patient progressing toward long term goals..  Continue plan of care.  PT Short Term Goals Week 2:  PT Short Term Goal 1 (Week 2): STG=LTG  Skilled Therapeutic Interventions/Progress Updates:  Ambulation/gait training;Discharge planning;Balance/vestibular training;Community reintegration;Functional electrical stimulation;Neuromuscular re-education;Patient/family education;Stair training;Therapeutic Exercise;UE/LE Coordination activities;Wheelchair propulsion/positioning;UE/LE Strength taining/ROM;Therapeutic Activities;Splinting/orthotics;Pain management;Functional mobility training;DME/adaptive equipment instruction     See Function Navigator for Current Functional Status.  Kimberly Vaughn 09/17/2016, 8:20 AM

## 2016-09-17 NOTE — Progress Notes (Addendum)
[place] PHYSICAL MEDICINE & REHABILITATION     PROGRESS NOTE  Subjective/Complaints:  Pt seen sitting up in bed this AM eating breakfast.  She is positive.  ROS: Denies nausea, vomiting, diarrhea, shortness of breath or chest pain   Objective: Vital Signs: Blood pressure (!) 151/53, pulse 63, temperature 98 F (36.7 C), temperature source Oral, resp. rate 17, height 5\' 1"  (1.549 m), weight 74.4 kg (164 lb), SpO2 97 %. 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 Dysphagia, with frequent/constant belching when swallowing. EXAM: ESOPHOGRAM/BARIUM SWALLOW TECHNIQUE: Single contrast examination was performed using  thin barium. FLUOROSCOPY TIME:  Fluoroscopy Time:  1 minutes 14 seconds Radiation Exposure Index (if provided by the fluoroscopic device): Number of Acquired Spot Images: 0 COMPARISON:  Chest CT 07/23/2016. FINDINGS: A single contrast study was undertaken and the patient tolerated this well. No obstruction to the forward flow of contrast throughout the esophagus and into the stomach. Normal esophageal course and contour. Normal esophageal mucosal pattern. Overall normal esophageal course and contour. Occasional tertiary contractions occurred, but with a fairly typical appearance for this age group. Normal gastroesophageal junction. A 12.5 mm barium tablet was administered and passed to the stomach without significant delay. The patient did exhibit frequent belching with each swallow. The etiology for this is unclear. IMPRESSION: Essentially normal for age esophagram. Negative for esophageal stricture or obstruction, and a 12.5 mm barium tablet was able to pass to the stomach. Electronically Signed   By: Genevie Ann M.D.   On: 09/15/2016 10:40    Recent Labs  09/16/16 0455  WBC 14.1*  HGB 11.9*  HCT 33.9*  PLT 290     Recent Labs  09/16/16 0455  NA 135  K 4.4  CL 101  GLUCOSE 140*  BUN 22*  CREATININE 0.68  CALCIUM 8.9   CBG (last 3)  No results for input(s): GLUCAP in the last 72 hours.  Wt Readings from Last 3 Encounters:  09/16/16 74.4 kg (164 lb)  09/04/16 84.2 kg (185 lb 10 oz)  09/01/16 76.7 kg (169 lb)    Physical Exam:  BP (!) 151/53 (BP Location: Left Arm)   Pulse 63   Temp 98 F (36.7 C) (Oral)   Resp 17   Ht 5\' 1"  (1.549 m)   Wt 74.4 kg (164 lb)   SpO2 97%   BMI 30.99 kg/m  Constitutional: She appears well-developed. Obese  HENT: Normocephalic. Left crani incision healing  Eyes: No discharge. EOMI. Neck: Normal range of motion. Neck supple.  Cardiovascular: RRR. No JVD. Respiratory: CTA Bilaterally. Normal effort   GI: Soft. Bowel sounds are normal.   Musculoskeletal: She exhibits no edema or tenderness.  Neurological: She is alert and oriented.  Significant facial weakness on left with sensory deficits, improving.  Mild dysarthria  Able to follow one and two step commands without difficulty.  LUE with ataxia.  Motor: RUE/RLE: 5/5 proximal to distal LUE/LLE: 5/5 proximal to distal (unchanged) Skin: Skin is warm and dry.  Psychiatric: She has a normal mood and affect. Her behavior is normal. Judgment and thought content normal.    Assessment/Plan: 1. 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 Physiatrist is providing close team supervision and 24 hour management of active medical problems listed  below. Physiatrist and rehab team continue to assess barriers to discharge/monitor patient progress toward functional and medical goals.  Function:  Bathing Bathing position   Position: Shower  Bathing parts Body parts bathed by patient: Right arm, Left arm, Chest, Abdomen, Front perineal area, Buttocks,  Right upper leg, Left upper leg, Right lower leg, Left lower leg, Back    Bathing assist Assist Level: Supervision or verbal cues      Upper Body Dressing/Undressing Upper body dressing   What is the patient wearing?: Pull over shirt/dress     Pull over shirt/dress - Perfomed by patient: Thread/unthread right sleeve, Thread/unthread left sleeve, Put head through opening, Pull shirt over trunk          Upper body assist Assist Level: More than reasonable time   Set up : To obtain clothing/put away  Lower Body Dressing/Undressing Lower body dressing   What is the patient wearing?: Underwear, Pants, Shoes, [employer]  - Performed by patient: Thread/unthread right underwear leg, Thread/unthread left underwear leg, Pull underwear up/down Underwear - Performed by helper: Pull underwear up/down Pants- Performed by patient: Thread/unthread right pants leg, Thread/unthread left pants leg, Pull pants up/down Pants- Performed by helper: Pull pants up/down     Socks - Performed by patient: Don/doff right sock, Don/doff left sock   Shoes - Performed by patient: Don/doff right shoe, Don/doff left shoe, Fasten right, Fasten left       TED Hose - Performed by patient: Don/doff right TED hose, Don/doff left TED hose TED Hose - Performed by helper: Don/doff right TED hose, Don/doff left TED hose  Lower body assist Assist for lower body dressing: Supervision or verbal cues   Set up : Don/doff TED stockings  Toileting Toileting   Toileting steps completed by patient: Adjust clothing prior to toileting, Performs perineal hygiene, Adjust clothing after toileting Toileting steps completed by helper: Adjust clothing prior to toileting, Adjust clothing after toileting Toileting Assistive Devices: Grab bar or rail  Toileting assist Assist level: Supervision or verbal cues   Transfers Chair/bed transfer   Chair/bed transfer method: Ambulatory Chair/bed transfer assist level: Supervision  or verbal cues Chair/bed transfer assistive device: Environmental consultant, Air cabin crew     Max distance: 10 Assist level: Moderate assist (Pt 50 - 74%)   Wheelchair   Type: Manual Max wheelchair distance: 150 Assist Level: Supervision or verbal cues  Cognition Comprehension Comprehension assist level: Follows complex conversation/direction with no assist  Expression Expression assist level: Expresses complex 90% of the time/cues < 10% of the time  Social Interaction Social Interaction assist level: Interacts appropriately with others - No medications needed.  Problem Solving Problem solving assist level: Solves complex problems: With extra time, Solves basic problems with no assist  Memory Memory assist level: More than reasonable amount of time    Medical Problem List and Plan: 1.   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  Cont CIR  Appreciate Heme/Onc, Rad/Onc follow up.  Plan for referral to [facility] after discharge 2.  DVT Prophylaxis/Anticoagulation: Mechanical: Sequential compression devices, below knee Bilateral lower extremities and mobility.  3. Pain Management: hydrocodone prn effective at present.  4. Mood: LCSW to follow for evaluation and support.  5. Neuropsych: This patient is capable of making decisions on her own behalf. 6. Skin/Wound Care: Monitor incision for healing. Maintain adequate nutritional and hydration status.  7. Fluids/Electrolytes/Nutrition: Monitor I/Os  D3 thin, advance as tolerated, after repeat MBS as well 8. HTN: Monitor BP bid. Continue HCTZ and metoprolol  Elevated this AM, otherwise controlled 9. Vestibular symptoms: Vestibular eval and treatment.  10. PAF: Monitor HR bid. Continue metoprolol.   EKG showing PACs  Orthostatic hypotension 11. CAD s/p PTCA/stent: On low dose ASA, metoprolol and lipitor  12.  GERD: Continue Protonix.  13. Hypokalemia: Improved after KCL runs. Multifactorial--D/Ced IVF. Added supplement as on diuretic. Encourage intake.   K+ 4.4 on 7/18 14. Impaired left eye closure: Eye drops qid and patch eye at bedtime.  14. Diarrhea: Discontinued colace 15. Hypoalbuminemia  Supplement initiated 7/12 16. ABLA  Hb 11.9 on 7/18  Cont to monitor 17. Leukocytosis  Likely steroid induced  WBCs 14.1 on 7/18  Afebrile 18. Hyperglycemia  Steroid induced  Relatively controlled 7/18  Cont to monitor  LOS (Days) 8 A FACE TO FACE EVALUATION WAS PERFORMED  Zylah Elsbernd Lorie Phenix 09/17/2016 8:38 AM

## 2016-09-18 ENCOUNTER — Inpatient Hospital Stay (HOSPITAL_COMMUNITY): Payer: Medicare Other | Admitting: Occupational Therapy

## 2016-09-18 ENCOUNTER — Encounter: Payer: Self-pay | Admitting: *Deleted

## 2016-09-18 ENCOUNTER — Inpatient Hospital Stay (HOSPITAL_COMMUNITY): Payer: Medicare Other | Admitting: Physical Therapy

## 2016-09-18 NOTE — Progress Notes (Signed)
[place] PHYSICAL MEDICINE & REHABILITATION     PROGRESS NOTE  Subjective/Complaints:  Pt seen laying in bed this AM.  She slept great overnight.  She notes improvement in sensation in her face.    ROS: Denies nausea, vomiting, diarrhea, shortness of breath or chest pain   Objective: Vital Signs: Blood pressure (!) 144/75, pulse 74, temperature 98.2 F (36.8 C), temperature source Oral, resp. rate 18, height 5\' 1"  (1.549 m), weight 74.4 kg (164 lb), SpO2 97 %. No results found.  Recent Labs  09/16/16 0455  WBC 14.1*  HGB 11.9*  HCT 33.9*  PLT 290    Recent Labs  09/16/16 0455  NA 135  K 4.4  CL 101  GLUCOSE 140*  BUN 22*  CREATININE 0.68  CALCIUM 8.9   CBG (last 3)  No results for input(s): GLUCAP in the last 72 hours.  Wt Readings from Last 3 Encounters:  09/16/16 74.4 kg (164 lb)  09/04/16 84.2 kg (185 lb 10 oz)  09/01/16 76.7 kg (169 lb)    Physical Exam:  BP (!) 144/75 (BP Location: Left Arm)   Pulse 74   Temp 98.2 F (36.8 C) (Oral)   Resp 18   Ht 5\' 1"  (1.549 m)   Wt 74.4 kg (164 lb)   SpO2 97%   BMI 30.99 kg/m  Constitutional: She appears well-developed. Obese  HENT: Normocephalic. Left crani incision healing  Eyes: No discharge. EOMI. Neck: Normal range of motion. Neck supple.  Cardiovascular: RRR. No JVD. Respiratory: CTA Bilaterally. Normal effort   GI: Soft. Bowel sounds are normal.   Musculoskeletal: She exhibits no edema or tenderness.  Neurological: She is alert and oriented.  Significant facial weakness on left with sensory deficits, improving.  Mild dysarthria  Able to follow one and two step commands without difficulty.  LUE with ataxia.  Motor: RUE/RLE: 5/5 proximal to distal LUE/LLE: 5/5 proximal to distal (stable) Skin: Skin is warm and dry.  Psychiatric: She has a normal mood and affect. Her behavior is normal. Judgment and thought content normal.    Assessment/Plan: 1. 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 Physiatrist is providing close team supervision and 24 hour management of active medical problems listed below. Physiatrist and rehab team continue to assess barriers to discharge/monitor patient progress toward functional and medical goals.  Function:  Bathing Bathing position   Position: Shower  Bathing parts Body parts bathed by patient: Right arm, Left arm, Chest, Abdomen, Front perineal area, Buttocks, Right upper leg, Left upper leg, Right lower leg, Left lower leg, Back    Bathing assist Assist Level: Supervision or verbal cues      Upper Body Dressing/Undressing Upper body dressing   What is the patient wearing?: Pull over shirt/dress     Pull over shirt/dress - Perfomed by patient: Thread/unthread right sleeve, Thread/unthread left sleeve, Put head through opening, Pull shirt over trunk          Upper body assist Assist Level: More than reasonable time   Set up : To obtain clothing/put away  Lower Body Dressing/Undressing Lower body dressing   What is the patient wearing?: Pants, Shoes, [employer]  - Performed by patient: Thread/unthread right underwear leg, Thread/unthread left underwear leg, Pull underwear up/down Underwear - Performed by helper: Pull underwear up/down Pants- Performed by patient: Thread/unthread right pants leg, Thread/unthread left pants leg, Pull pants  up/down Pants- Performed by helper: Pull pants up/down     Socks - Performed by patient: Don/doff right sock, Don/doff left sock   Shoes - Performed by patient: Don/doff right shoe, Don/doff left shoe, Fasten right, Fasten left       TED Hose - Performed by patient: Don/doff right TED hose, Don/doff left TED hose TED Hose - Performed by helper: Don/doff right TED hose, Don/doff left TED hose  Lower body assist Assist  for lower body dressing: Supervision or verbal cues   Set up : To obtain clothing/put away  Toileting Toileting   Toileting steps completed by patient: Adjust clothing prior to toileting, Performs perineal hygiene, Adjust clothing after toileting Toileting steps completed by helper: Adjust clothing prior to toileting, Adjust clothing after toileting Toileting Assistive Devices: Grab bar or rail  Toileting assist Assist level: Supervision or verbal cues   Transfers Chair/bed transfer   Chair/bed transfer method: Stand pivot, Ambulatory Chair/bed transfer assist level: Supervision or verbal cues Chair/bed transfer assistive device: Armrests, Medical sales representative     Max distance: 125 Assist level: Touching or steadying assistance (Pt > 75%)   Wheelchair   Type: Manual Max wheelchair distance: 150 Assist Level: Supervision or verbal cues  Cognition Comprehension Comprehension assist level: Follows complex conversation/direction with no assist  Expression Expression assist level: Expresses complex 90% of the time/cues < 10% of the time  Social Interaction Social Interaction assist level: Interacts appropriately with others - No medications needed.  Problem Solving Problem solving assist level: Solves complex problems: Recognizes & self-corrects  Memory Memory assist level: More than reasonable amount of time    Medical Problem List and Plan: 1.   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  Cont CIR  Appreciate Heme/Onc, Rad/Onc follow up.  Plan for referral to [facility] after discharge 2.  DVT Prophylaxis/Anticoagulation: Mechanical: Sequential compression devices, below knee Bilateral lower extremities and mobility.  3. Pain Management: hydrocodone prn effective at present.  4. Mood: LCSW to follow for evaluation and support.  5. Neuropsych: This  patient is capable of making decisions on her own behalf. 6. Skin/Wound Care: Monitor incision for healing. Maintain adequate nutritional and hydration status.  7. Fluids/Electrolytes/Nutrition: Monitor I/Os  D3 thin, advance as tolerated, after repeat MBS as well 8. HTN: Monitor BP bid. Continue HCTZ and metoprolol  Slightly labile, but overall controlled 9. Vestibular symptoms: Vestibular eval and treatment.  10. PAF: Monitor HR bid. Continue metoprolol.   EKG showing PACs  Orthostatic hypotension 11. CAD s/p PTCA/stent: On low dose ASA, metoprolol and lipitor  12. GERD: Continue Protonix.  13. Hypokalemia: Improved after KCL runs. Multifactorial--D/Ced IVF. Added supplement as on diuretic. Encourage intake.   K+ 4.4 on 7/18  Labs ordered for Monday 14. Impaired left eye closure: Eye drops qid and patch eye at bedtime.  14. Diarrhea: Discontinued colace 15. Hypoalbuminemia  Supplement initiated 7/12 16. ABLA  Hb 11.9 on 7/18  Labs ordered for Monday  Cont to monitor 17. Leukocytosis  Likely steroid induced  WBCs 14.1 on 7/18  Labs ordered for Monday  Afebrile 18. Hyperglycemia  Steroid induced  Cont to monitor  LOS (Days) 9 A FACE TO FACE EVALUATION WAS PERFORMED  Noura Purpura Lorie Phenix 09/18/2016 2:17 PM

## 2016-09-18 NOTE — Progress Notes (Signed)
Occupational Therapy Session Note  Patient Details  Name: Kimberly Vaughn MRN: [id number] Date of Birth: December 11, 1945  Today's Date: 09/18/2016 OT Individual Time: [phone] and 1422-1505 OT Individual Time Calculation (min): 60 min and 43 min   Short Term Goals: Week 2:  OT Short Term Goal 1 (Week 2): STG = LTGs due to remaining LOS  Skilled Therapeutic Interventions/Progress Updates:    1) Treatment session with focus on motor control and eccentric control in LLE during functional reaching tasks.  Pt completed grooming tasks in standing at Mod I level with UE support on sink for stability. Gathered clothing from closet with RW and reaching outside BOS to Rt to compensate for decreased strength and control on Lt.  Completed dressing with supervision for sit > stand when donning shorts.  In therapy gym, engaged in trunk control with reaching across midline and outside BOS to challenge trunk control and LLE stability with increased knee flexion and incorporating bending.  Pt with decreased control when bending and reaching to Lt with decreased hamstring strength, losing balance 5x out of ~20, sitting down on mat each time as unable to correct during movement.  Plan to continue to address as well as educated pt on compensatory strategies such as turning towards Lt when reaching items on Lt side.  2) Completed ADL retraining with focus on dynamic standing balance during bathing, LB dressing, and when reaching into closet and drawers.  Pt overall supervision with all mobility and self-care tasks this session.  One LOB when entering bathroom, over threshold, requiring min assist to correct.  Pt utilizes DME to increase stability and independence in shower.  Good recall of education from previous sessions.  Therapy Documentation Precautions:  Precautions Precautions: Fall Precaution Comments: Does not hear out of L ear; L eye with blurred vision. (but improving) Restrictions Weight Bearing  Restrictions: No General:   Vital Signs: Therapy Vitals Temp: 98.2 F (36.8 C) Temp Source: Oral Pulse Rate: 74 Resp: 18 BP: (!) 144/75 Patient Position (if appropriate): Lying Oxygen Therapy SpO2: 97 % O2 Device: Not Delivered Pain:  Pt with no c/o pain  See Function Navigator for Current Functional Status.   Therapy/Group: Individual Therapy  [name] 09/18/2016, 8:48 AM

## 2016-09-18 NOTE — Progress Notes (Signed)
Occupational Therapy Session Note  Patient Details  Name: Kimberly Vaughn MRN: [id number] Date of Birth: Sep 27, 1945  Today's Date: 09/18/2016 OT Individual Time: 1005-1045 OT Individual Time Calculation (min): 40 min    Short Term Goals: Week 1:  OT Short Term Goal 1 (Week 1): STG = LTGs due to estimated LOS OT Short Term Goal 1 - Progress (Week 1): Progressing toward goal Week 2:  OT Short Term Goal 1 (Week 2): STG = LTGs due to remaining LOS  Skilled Therapeutic Interventions/Progress Updates:    Pt seen this session to facilitate dynamic balance with strengthening of LLE.  Pt received in w/c and agreeable to therapy.  Pt taken to gym and chair placed at parallel bars. Pt worked on sit to stands, stepping out laterally with L foot, lunging out laterally with L foot, side steps with Level 1 theraband loop around ankles, L leg stabilization with R knee lifts, and squats all to target hip stabilizers and quads to increase her support as she reaches to her L in standing.  She also worked on heel raises with controlled descent.  She did need to use BUE on bar for support, tried exercises without hand support and she did not have enough LLE control to do that yet.  Pt tolerated therapy well needing a few short rest breaks.  Pt taken back to room with all needs met.   Therapy Documentation Precautions:  Precautions Precautions: Fall Precaution Comments: Does not hear out of L ear; L eye with blurred vision. (but improving) Restrictions Weight Bearing Restrictions: No      Pain: Pain Assessment Pain Assessment: No/denies pain ADL:   See Function Navigator for Current Functional Status.   Therapy/Group: Individual Therapy  Alton Bouknight 09/18/2016, 12:05 PM

## 2016-09-18 NOTE — Progress Notes (Signed)
Physical Therapy Session Note  Patient Details  Name: Kimberly Vaughn MRN: [id number] Date of Birth: Aug 24, 1945  Today's Date: 09/18/2016 PT Individual Time: 0915-1000 PT Individual Time Calculation (min): 45 min   Short Term Goals: Week 1:  PT Short Term Goal 1 (Week 1): STG=LTG  Skilled Therapeutic Interventions/Progress Updates:    no c/o pain.  Session focus on activity tolerance and balance.    Pt propels w/c to therapy gym with supervision for cardiopulmonary endurance.  Gait within gym x125' with RW and steady assist, progress to high level gait weaving through cones with steady assist.  Pt completes standing balance on airex pad with min assist x60s, progress to standing on airex completing 2 simple pipe tree designs with min assist fade to close supervision.  Pt returned to room at end of session and positioned in w/c with call bell in reach and needs met.   Therapy Documentation Precautions:  Precautions Precautions: Fall Precaution Comments: Does not hear out of L ear; L eye with blurred vision. (but improving) Restrictions Weight Bearing Restrictions: No   See Function Navigator for Current Functional Status.   Therapy/Group: Individual Therapy  Earnest Conroy Penven-Crew 09/18/2016, 12:11 PM

## 2016-09-18 NOTE — Progress Notes (Signed)
Medical oncology follow up Dr. Maylon Peppers at [facility] set for 7/26 at 10:15 am.   Please call with questions regarding Kimberly Vaughn.

## 2016-09-19 ENCOUNTER — Inpatient Hospital Stay (HOSPITAL_COMMUNITY): Payer: Medicare Other | Admitting: Occupational Therapy

## 2016-09-19 NOTE — Progress Notes (Signed)
Occupational Therapy Session Note  Patient Details  Name: Marquita Lias MRN: [id number] Date of Birth: 1945/10/14  Today's Date: 09/19/2016 OT Individual Time: 1345-1430 OT Individual Time Calculation (min): 45 min    Short Term Goals: Week 2:  OT Short Term Goal 1 (Week 2): STG = LTGs due to remaining LOS  Skilled Therapeutic Interventions/Progress Updates:    Treatment session with focus on trunk control and quad strengthening during reaching activity.  Upon arrival, pt reports need to toilet. Ambulated to bathroom with RW and supervision, completing toileting tasks at Mod I level.  Engaged in reaching activity in standing with focus on trunk rotation to Lt to promote increased visual scanning to Lt, LUE fine and gross motor control, and strengthening of glutes and quads.  Supervision - min guard during reaching activity based on level of fatigue.  Increased challenge to standing while on wedge to promote increased strengthening and awareness.  Mini squats in standing with pt unable complete controlled descent without UE support, often losing balance to Lt and sitting on mat as unable to correct.  Engaged in toe taps with RLE to increase control and strengthening in LLE, pt requiring min assist while in single leg stance.  Discussed functional carryover of activity and recommended sit > stand for LB dressing and use of seat in shower to decrease fall risk.  Therapy Documentation Precautions:  Precautions Precautions: Fall Precaution Comments: Does not hear out of L ear; L eye with blurred vision. (but improving) Restrictions Weight Bearing Restrictions: No Pain:  Pt with no c/o pain  See Function Navigator for Current Functional Status.   Therapy/Group: Individual Therapy  [name] 09/19/2016, 3:21 PM

## 2016-09-19 NOTE — Progress Notes (Signed)
[facility] MEDICINE & REHABILITATION     PROGRESS NOTE  Subjective/Complaints:  No dizziness no facial pain  ROS: Denies nausea, vomiting, diarrhea, shortness of breath or chest pain   Objective: Vital Signs: Blood pressure (!) 161/58, pulse (!) 58, temperature 98 F (36.7 C), temperature source Oral, resp. rate 18, height 5\' 1"  (1.549 m), weight 77.5 kg (170 lb 14.4 oz), SpO2 98 %. No results found. No results for input(s): WBC, HGB, HCT, PLT in the last 72 hours. No results for input(s): NA, K, CL, GLUCOSE, BUN, CREATININE, CALCIUM in the last 72 hours.  Invalid input(s): CO CBG (last 3)  No results for input(s): GLUCAP in the last 72 hours.  Wt Readings from Last 3 Encounters:  09/18/16 77.5 kg (170 lb 14.4 oz)  09/04/16 84.2 kg (185 lb 10 oz)  09/01/16 76.7 kg (169 lb)    Physical Exam:  BP (!) 161/58 (BP Location: Left Arm)   Pulse (!) 58   Temp 98 F (36.7 C) (Oral)   Resp 18   Ht 5\' 1"  (1.549 m)   Wt 77.5 kg (170 lb 14.4 oz)   SpO2 98%   BMI 32.29 kg/m  Constitutional: She appears well-developed. Obese  HENT: Normocephalic. Left crani incision healing  Eyes: No discharge. EOMI. Neck: Normal range of motion. Neck supple.  Cardiovascular: RRR. No JVD. Respiratory: CTA Bilaterally. Normal effort   GI: Soft. Bowel sounds are normal.   Musculoskeletal: She exhibits no edema or tenderness.  Neurological: She is alert and oriented.  Significant facial weakness on left with sensory deficits,V1,2,3 improving.  Mild dysarthria  Able to follow one and two step commands without difficulty.  LUE with ataxia.  Motor: RUE/RLE: 5/5 proximal to distal LUE/LLE: 5/5 proximal to distal (stable) Skin: Skin is warm and dry.  Psychiatric: She has a normal mood and affect. Her behavior is normal. Judgment and thought content normal.    Assessment/Plan: 1. 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 Physiatrist is providing close team supervision and 24 hour management of active medical problems listed below. Physiatrist and rehab team continue to assess barriers to discharge/monitor patient progress toward functional and medical goals.  Function:   Bathing Bathing position   Position: Shower  Bathing parts Body parts bathed by patient: Right arm, Left arm, Chest, Abdomen, Front perineal area, Buttocks, Right upper leg, Left upper leg, Right lower leg, Left lower leg, Back    Bathing assist Assist Level: Supervision or verbal cues      Upper Body Dressing/Undressing Upper body dressing   What is the patient wearing?: Pull over shirt/dress     Pull over shirt/dress - Perfomed by patient: Thread/unthread right sleeve, Thread/unthread left sleeve, Put head through opening, Pull shirt over trunk          Upper body assist Assist Level: More than reasonable time   Set up : To obtain clothing/put away  Lower Body Dressing/Undressing Lower body dressing   What is the patient wearing?: Pants, Shoes, [employer]  - Performed by patient: Thread/unthread right underwear leg, Thread/unthread left underwear leg, Pull underwear up/down Underwear - Performed by helper: Pull underwear up/down Pants- Performed by patient: Thread/unthread right pants leg, Thread/unthread left pants leg, Pull pants up/down Pants- Performed by helper: Pull pants up/down     Socks - Performed by patient: Don/doff right sock, Don/doff left sock  Shoes - Performed by patient: Don/doff right shoe, Don/doff left shoe, Fasten right, Fasten left       TED Hose - Performed by patient: Don/doff right TED hose, Don/doff left TED hose TED Hose - Performed by helper: Don/doff right TED hose, Don/doff left TED hose  Lower body assist Assist for lower body dressing: Supervision or verbal cues   Set up : To obtain  clothing/put away  Toileting Toileting   Toileting steps completed by patient: Adjust clothing prior to toileting, Performs perineal hygiene, Adjust clothing after toileting Toileting steps completed by helper: Adjust clothing prior to toileting, Adjust clothing after toileting Toileting Assistive Devices: Grab bar or rail  Toileting assist Assist level: Supervision or verbal cues   Transfers Chair/bed transfer   Chair/bed transfer method: Stand pivot, Ambulatory Chair/bed transfer assist level: Supervision or verbal cues Chair/bed transfer assistive device: Armrests, Medical sales representative     Max distance: 125 Assist level: Touching or steadying assistance (Pt > 75%)   Wheelchair   Type: Manual Max wheelchair distance: 150 Assist Level: Supervision or verbal cues  Cognition Comprehension Comprehension assist level: Follows complex conversation/direction with no assist  Expression Expression assist level: Expresses complex 90% of the time/cues < 10% of the time  Social Interaction Social Interaction assist level: Interacts appropriately with others - No medications needed.  Problem Solving Problem solving assist level: Solves complex problems: Recognizes & self-corrects  Memory Memory assist level: More than reasonable amount of time    Medical Problem List and Plan: 1.   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  Cont CIR PT, OT SLP  Appreciate Heme/Onc, Rad/Onc follow up.  Plan for referral to [facility] after discharge 2.  DVT Prophylaxis/Anticoagulation: Mechanical: Sequential compression devices, below knee Bilateral lower extremities and mobility.  3. Pain Management: hydrocodone prn effective at present.  4. Mood: LCSW to follow for evaluation and support.  5. Neuropsych: This patient is capable of making decisions on her own behalf. 6.  Skin/Wound Care: Monitor incision for healing. Maintain adequate nutritional and hydration status.  7. Fluids/Electrolytes/Nutrition: Monitor I/Os  D3 thin, advance as tolerated, after repeat MBS as well 8. HTN: Monitor BP bid. Continue HCTZ and metoprolol  Slightly labile, systolic elevated this am, monitor for now Vitals:   09/18/16 1619 09/19/16 0444  BP: 115/61 (!) 161/58  Pulse: (!) 55 (!) 58  Resp: 18 18  Temp: 98.9 F (37.2 C) 98 F (36.7 C)   9. Vestibular symptoms: Vestibular eval and treatment.  10. PAF: Monitor HR bid. Continue metoprolol.   EKG showing PACs  Orthostatic hypotension 11. CAD s/p PTCA/stent: On low dose ASA, metoprolol and lipitor  12. GERD: Continue Protonix.  13. Hypokalemia: Improved after KCL runs. Multifactorial--D/Ced IVF. Added supplement as on diuretic. Encourage intake.   K+ 4.4 on 7/18  Labs ordered for Monday 14. Impaired left eye closure: Eye drops qid and patch eye at bedtime.  14. Diarrhea: Discontinued colace 15. Hypoalbuminemia  Supplement initiated 7/12 16. ABLA  Hb 11.9 on 7/18  Labs ordered for Monday  Cont to monitor 17. Leukocytosis  Likely steroid induced  WBCs 14.1 on 7/18  Labs ordered for Monday  Afebrile 18. Hi dose decadron- check CBG daily     LOS (Days) 10 A FACE TO FACE EVALUATION WAS PERFORMED  Martavis Gurney E 09/19/2016 9:12 AM

## 2016-09-20 DIAGNOSIS — R270 Ataxia, unspecified: Principal | ICD-10-CM

## 2016-09-20 NOTE — Progress Notes (Signed)
[facility] PHYSICAL MEDICINE & REHABILITATION     PROGRESS NOTE  Subjective/Complaints:   No issues overnite  ROS: Denies nausea, vomiting, diarrhea, shortness of breath or chest pain   Objective: Vital Signs: Blood pressure 137/64, pulse 62, temperature 98 F (36.7 C), temperature source Oral, resp. rate 18, height 5\' 1"  (1.549 m), weight 77.5 kg (170 lb 14.4 oz), SpO2 96 %. No results found. No results for input(s): WBC, HGB, HCT, PLT in the last 72 hours. No results for input(s): NA, K, CL, GLUCOSE, BUN, CREATININE, CALCIUM in the last 72 hours.  Invalid input(s): CO CBG (last 3)  No results for input(s): GLUCAP in the last 72 hours.  Wt Readings from Last 3 Encounters:  09/18/16 77.5 kg (170 lb 14.4 oz)  09/04/16 84.2 kg (185 lb 10 oz)  09/01/16 76.7 kg (169 lb)    Physical Exam:  BP 137/64 (BP Location: Left Arm)   Pulse 62   Temp 98 F (36.7 C) (Oral)   Resp 18   Ht 5\' 1"  (1.549 m)   Wt 77.5 kg (170 lb 14.4 oz)   SpO2 96%   BMI 32.29 kg/m  Constitutional: She appears well-developed. Obese  HENT: Normocephalic. Left crani incision healing  Eyes: No discharge. EOMI. Neck: Normal range of motion. Neck supple.  Cardiovascular: RRR. No JVD. Respiratory: CTA Bilaterally. Normal effort   GI: Soft. Bowel sounds are normal.   Musculoskeletal: She exhibits no edema or tenderness.  Neurological: She is alert and oriented.  Significant facial weakness on left with sensory deficits,V1,2,3 improving.  Mild dysarthria  Able to follow one and two step commands without difficulty.  LUE with ataxia.  Motor: RUE/RLE: 5/5 proximal to distal LUE/LLE: 5/5 proximal to distal (stable) Skin: Skin is warm and dry.  Psychiatric: She has a normal mood and affect. Her behavior is normal. Judgment and thought content normal.    Assessment/Plan: 1. 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 Physiatrist is providing close team supervision and 24 hour management of active medical problems listed below. Physiatrist and rehab team continue to assess barriers to discharge/monitor patient progress toward functional and medical goals.  Function:   Bathing Bathing position   Position: Shower  Bathing parts Body parts bathed by patient: Right arm, Left arm, Chest, Abdomen, Front perineal area, Buttocks, Right upper leg, Left upper leg, Right lower leg, Left lower leg, Back    Bathing assist Assist Level: Supervision or verbal cues      Upper Body Dressing/Undressing Upper body dressing   What is the patient wearing?: Pull over shirt/dress     Pull over shirt/dress - Perfomed by patient: Thread/unthread right sleeve, Thread/unthread left sleeve, Put head through opening, Pull shirt over trunk          Upper body assist Assist Level: More than reasonable time   Set up : To obtain clothing/put away  Lower Body Dressing/Undressing Lower body dressing   What is the patient wearing?: Pants, Shoes, [employer]  - Performed by patient: Thread/unthread right underwear leg, Thread/unthread left underwear leg, Pull underwear up/down Underwear - Performed by helper: Pull underwear up/down Pants- Performed by patient: Thread/unthread right pants leg, Thread/unthread left pants leg, Pull pants up/down Pants- Performed by helper: Pull pants up/down     Socks - Performed by patient: Don/doff right sock, Don/doff left sock   Shoes - Performed by patient:  Don/doff right shoe, Don/doff left shoe, Fasten right, Fasten left       TED Hose - Performed by patient: Don/doff right TED hose, Don/doff left TED hose TED Hose - Performed by helper: Don/doff right TED hose, Don/doff left TED hose  Lower body assist Assist for lower body dressing: Supervision or verbal cues   Set up : To obtain clothing/put away   Toileting Toileting   Toileting steps completed by patient: Adjust clothing prior to toileting, Performs perineal hygiene, Adjust clothing after toileting Toileting steps completed by helper: Adjust clothing prior to toileting, Adjust clothing after toileting Toileting Assistive Devices: Grab bar or rail  Toileting assist Assist level: More than reasonable time   Transfers Chair/bed transfer   Chair/bed transfer method: Stand pivot, Ambulatory Chair/bed transfer assist level: Supervision or verbal cues Chair/bed transfer assistive device: Armrests, Medical sales representative     Max distance: 125 Assist level: Touching or steadying assistance (Pt > 75%)   Wheelchair   Type: Manual Max wheelchair distance: 150 Assist Level: Supervision or verbal cues  Cognition Comprehension Comprehension assist level: Follows complex conversation/direction with no assist  Expression Expression assist level: Expresses complex 90% of the time/cues < 10% of the time  Social Interaction Social Interaction assist level: Interacts appropriately with others - No medications needed.  Problem Solving Problem solving assist level: Solves complex problems: Recognizes & self-corrects  Memory Memory assist level: More than reasonable amount of time    Medical Problem List and Plan: 1.   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  Cont CIR PT, OT SLP  Appreciate Heme/Onc, Rad/Onc follow up.  Plan for referral to [facility] after discharge 2.  DVT Prophylaxis/Anticoagulation: Mechanical: Sequential compression devices, below knee Bilateral lower extremities and mobility.  3. Pain Management: hydrocodone prn effective at present.  4. Mood: LCSW to follow for evaluation and support.  5. Neuropsych: This patient is capable of making decisions on her own behalf. 6. Skin/Wound Care:  Monitor incision for healing. Maintain adequate nutritional and hydration status.  7. Fluids/Electrolytes/Nutrition: Monitor I/Os  D3 thin, advance as tolerated, after repeat MBS as well 8. HTN: Monitor BP bid. Continue HCTZ and metoprolol  Controlled 7/22 Vitals:   09/19/16 2108 09/20/16 0425  BP: (!) 134/58 137/64  Pulse: 66 62  Resp:  18  Temp:  98 F (36.7 C)   9. Vestibular symptoms: Vestibular eval and treatment.  10. PAF: Monitor HR bid. Continue metoprolol.   EKG showing PACs  Orthostatic hypotension 11. CAD s/p PTCA/stent: On low dose ASA, metoprolol and lipitor  12. GERD: Continue Protonix.  13. Hypokalemia: Improved after KCL runs. Multifactorial--D/Ced IVF. Added supplement as on diuretic. Encourage intake.   K+ 4.4 on 7/18  Labs ordered for Monday 14. Impaired left eye closure: Eye drops qid and patch eye at bedtime.  14. Diarrhea: Discontinued colace 15. Hypoalbuminemia  Supplement initiated 7/12 16. ABLA  Hb 11.9 on 7/18  Labs ordered for Monday  Cont to monitor 17. Leukocytosis  Likely steroid induced  WBCs 14.1 on 7/18  Labs ordered for Monday  Afebrile 18. Hi dose decadron- check CBG daily, 140 this am 7/22    LOS (Days) 11 A FACE TO FACE EVALUATION WAS PERFORMED  Kimberly Vaughn E 09/20/2016 9:09 AM

## 2016-09-21 ENCOUNTER — Inpatient Hospital Stay (HOSPITAL_COMMUNITY): Payer: Medicare Other | Admitting: Physical Therapy

## 2016-09-21 ENCOUNTER — Inpatient Hospital Stay (HOSPITAL_COMMUNITY): Payer: Medicare Other | Admitting: Occupational Therapy

## 2016-09-21 LAB — CBC WITH DIFFERENTIAL/PLATELET
BASOS ABS: 0 10*3/uL (ref 0.0–0.1)
BASOS PCT: 0 %
EOS PCT: 0 %
Eosinophils Absolute: 0 10*3/uL (ref 0.0–0.7)
HCT: 35.1 % — ABNORMAL LOW (ref 36.0–46.0)
Hemoglobin: 12.1 g/dL (ref 12.0–15.0)
LYMPHS PCT: 18 %
Lymphs Abs: 2.5 10*3/uL (ref 0.7–4.0)
MCH: 32.9 pg (ref 26.0–34.0)
MCHC: 34.5 g/dL (ref 30.0–36.0)
MCV: 95.4 fL (ref 78.0–100.0)
MONO ABS: 0.8 10*3/uL (ref 0.1–1.0)
Monocytes Relative: 5 %
Neutro Abs: 10.9 10*3/uL — ABNORMAL HIGH (ref 1.7–7.7)
Neutrophils Relative %: 77 %
Platelets: 222 10*3/uL (ref 150–400)
RBC: 3.68 MIL/uL — ABNORMAL LOW (ref 3.87–5.11)
RDW: 13.6 % (ref 11.5–15.5)
WBC: 14.1 10*3/uL — ABNORMAL HIGH (ref 4.0–10.5)

## 2016-09-21 LAB — BASIC METABOLIC PANEL
Anion gap: 9 (ref 5–15)
BUN: 29 mg/dL — AB (ref 6–20)
CALCIUM: 9 mg/dL (ref 8.9–10.3)
CO2: 26 mmol/L (ref 22–32)
Chloride: 97 mmol/L — ABNORMAL LOW (ref 101–111)
Creatinine, Ser: 0.77 mg/dL (ref 0.44–1.00)
GFR calc Af Amer: >60 mL/min (ref >60)
GLUCOSE: 225 mg/dL — AB (ref 65–99)
Potassium: 4 mmol/L (ref 3.5–5.1)
Sodium: 132 mmol/L — ABNORMAL LOW (ref 135–145)

## 2016-09-21 NOTE — Progress Notes (Signed)
Occupational Therapy Discharge Summary  Patient Details  Name: Kimberly Vaughn MRN: [id number] Date of Birth: Jan 05, 1946  Today's Date: 09/21/2016 OT Individual [phone] and [id number] OT Individual Time Calculation (min): 57 min and 31 min    Visit 1 with focus on ADL completion and LUE FMC. Pt completed bathing and dressing ADLs with overall mod independent level of assist. Ambulates in room during session completion using RW to perform bathing at sit<>stand level in walk-in shower all with mod independence. Pt completes UB/LB dressing at sit<>stand level seated in w/c using RW for UE support during standing to pull pants over hips with mod independence.  Pt demonstrates good recall of theraputty exercises utilizing LUE to demonstrate each exercise while seated in w/c. Ended session with Pt seated in w/c, RN present, call bell and needs within reach.  Visit 2 with additional focus on functional mobility transfers and ADL completion. Pt ambulates room<>bathroom using RW, completes toilet transfer and toileting with mod independence. Pt stood at sink to complete standing grooming ADLs with mod independence. Transported Pt to rehab apartment and day room via w/c for time management where Pt demonstrates w/c<>EOB<>supine in bed, sit<>stand from recliner, and sit<>stand from chair without armrests as pt reports her kitchen chairs do not have arm support. Pt completes all transfers with mod independence using RW. Reviewed and provided education on safe mobility in the kitchen using RW when completing iADL tasks with Pt verbalizing understanding throughout. Ended session with Pt seated in w/c in room, call bell and needs within reach.   Patient has met 12 of 12 long term goals due to improved activity tolerance, improved balance, postural control, ability to compensate for deficits, functional use of  LEFT upper extremity and improved coordination.  Patient to discharge at overall Modified Independent  level.  Patient's care partner is independent to provide the necessary physical assistance at discharge.    Reasons goals not met: NA  Recommendation:  Patient will benefit from ongoing skilled OT services in home health setting to continue to advance functional skills in the area of BADL and iADL.  Equipment: No equipment provided  Reasons for discharge: treatment goals met  Patient/family agrees with progress made and goals achieved: Yes  OT Discharge Precautions/Restrictions  Precautions Precautions: Fall Precaution Comments: Does not hear out of L ear; L eye with blurred vision. (but improving) Restrictions Weight Bearing Restrictions: No General   Vital Signs  Pain Pain Assessment Pain Assessment: No/denies pain ADL ADL ADL Comments: Please see functional navigator for ADL status  Vision Patient Visual Report: Blurring of vision (L eye ) Vision Assessment?: Yes Eye Alignment: Within Functional Limits Ocular Range of Motion:  (restrictions in L eye ) Alignment/Gaze Preference: Within Defined Limits Additional Comments: Pt is not able to fully close L eye, reports blurry vision in this eye, reports double vision has subsided  Perception    Praxis   Cognition Overall Cognitive Status: Within Functional Limits for tasks assessed Arousal/Alertness: Awake/alert Orientation Level: Oriented X4 Attention: Selective Selective Attention: Appears intact Memory: Appears intact Awareness: Appears intact Problem Solving: Appears intact Safety/Judgment: Appears intact Comments: Pt demonstrates good safey awareness throughout functional mobility and ADL task completion  Sensation Sensation Light Touch:  (absent L side of face ) Proprioception: Appears Intact Coordination Gross Motor Movements are Fluid and Coordinated: No Fine Motor Movements are Fluid and Coordinated: No Coordination and Movement Description: LUE ataxia, dysmetria Motor  Motor Motor:  Hemiplegia;Abnormal postural alignment and control Motor - Discharge Observations: Lt  side weakness Mobility  Bed Mobility Supine to Sit: 6: Modified independent (Device/Increase time) Transfers Transfers: Sit to Stand;Stand to Sit Sit to Stand: 6: Modified independent (Device/Increase time) Stand to Sit: 6: Modified independent (Device/Increase time)  Trunk/Postural Assessment  Cervical Assessment Cervical Assessment: Within Functional Limits Thoracic Assessment Thoracic Assessment: Within Functional Limits Lumbar Assessment Lumbar Assessment:  (posterior pelvic tilt ) Postural Control Righting Reactions: delayed  Balance Balance Balance Assessed: Yes Standardized Balance Assessment Standardized Balance Assessment: Berg Balance Test Berg Balance Test Sit to Stand: Able to stand  independently using hands Standing Unsupported: Able to stand 2 minutes with supervision Sitting with Back Unsupported but Feet Supported on Floor or Stool: Able to sit safely and securely 2 minutes Stand to Sit: Controls descent by using hands Transfers: Able to transfer safely, definite need of hands Standing Unsupported with Eyes Closed: Able to stand 10 seconds with supervision Standing Ubsupported with Feet Together: Needs help to attain position and unable to hold for 15 seconds From Standing, Reach Forward with Outstretched Arm: Reaches forward but needs supervision From Standing Position, Pick up Object from Floor: Unable to try/needs assist to keep balance From Standing Position, Turn to Look Behind Over each Shoulder: Needs assist to keep from losing balance and falling Turn 360 Degrees: Needs assistance while turning Standing Unsupported, Alternately Place Feet on Step/Stool: Able to complete >2 steps/needs minimal assist Standing Unsupported, One Foot in Front: Loses balance while stepping or standing Standing on One Leg: Unable to try or needs assist to prevent fall Total Score: 21 Static  Sitting Balance Static Sitting - Balance Support: Feet supported Static Sitting - Level of Assistance: 6: Modified independent (Device/Increase time) Dynamic Sitting Balance Dynamic Sitting - Balance Support: During functional activity;Feet supported Dynamic Sitting - Level of Assistance: 6: Modified independent (Device/Increase time) Static Standing Balance Static Standing - Balance Support: During functional activity;Bilateral upper extremity supported;Left upper extremity supported;Right upper extremity supported;No upper extremity supported Static Standing - Level of Assistance: 6: Modified independent (Device/Increase time) Static Standing - Comment/# of Minutes: Pt using single UE, bil UE, and no UE support PRN througout ADL task completion  Dynamic Standing Balance Dynamic Standing - Balance Support: Bilateral upper extremity supported Dynamic Standing - Level of Assistance: 6: Modified independent (Device/Increase time) Extremity/Trunk Assessment RUE Assessment RUE Assessment: Within Functional Limits LUE Assessment LUE Assessment: Within Functional Limits (AROM WFL, strength remains grossly 4+/5)   See Function Navigator for Current Functional Status.  Raymondo Band 09/21/2016, 3:35 PM

## 2016-09-21 NOTE — Progress Notes (Signed)
Physical Therapy Discharge Summary  Patient Details  Name: Kimberly Vaughn MRN: [id number] Date of Birth: Sep 02, 1945  Today's Date: 09/21/2016 PT Concurrent Time: 1412-1515 PT Concurrent Time Calculation (min): 63 min  Pt participated in concurrent PT session. Pt performed w/c mobility for UE strength and endurance 2 x 150' with mod I.  Berg balance test performed with pt scoring 21/56, high fall risk. Pt aware of recommendation for RW with all mobility at home.  Gait with RW with obstacle negotiation and stepping over obstacles with supervision.  otago exercises performed to improve strength and balance.  Patient has met 4 of 7 long term goals due to improved activity tolerance, improved balance, increased strength, ability to compensate for deficits and functional use of  left lower extremity.  Patient to discharge at an ambulatory level Supervision.   Patient's care partner is independent to provide the necessary supervision assistance at discharge.  Reasons goals not met: pt requires supervision for gait  Recommendation:  Patient will benefit from ongoing skilled PT services in home health setting to continue to advance safe functional mobility, address ongoing impairments in gait, balance, strength, and minimize fall risk.  Equipment: RW, w/c  Reasons for discharge: treatment goals met and discharge from hospital  Patient/family agrees with progress made and goals achieved: Yes  PT Discharge Precautions/Restrictions Precautions Precautions: Fall Precaution Comments: Does not hear out of L ear; L eye with blurred vision. (but improving) Restrictions Weight Bearing Restrictions: No  Pain Pain Assessment Pain Assessment: No/denies pain   Cognition Overall Cognitive Status: Within Functional Limits for tasks assessed Sensation Sensation Light Touch:  (absent Lt side of face) Proprioception: Appears Intact Coordination Gross Motor Movements are Fluid and Coordinated:  No Coordination and Movement Description: Lt ataxia, dysmetria Motor  Motor Motor: Hemiplegia;Abnormal postural alignment and control Motor - Discharge Observations: Lt weakness   Trunk/Postural Assessment  Cervical Assessment Cervical Assessment: Within Functional Limits Thoracic Assessment Thoracic Assessment: Within Functional Limits Lumbar Assessment Lumbar Assessment:  (posterior pelvic tilt) Postural Control Righting Reactions: delayed  Balance Standardized Balance Assessment Standardized Balance Assessment: Berg Balance Test Berg Balance Test Sit to Stand: Able to stand  independently using hands Standing Unsupported: Able to stand 2 minutes with supervision Sitting with Back Unsupported but Feet Supported on Floor or Stool: Able to sit safely and securely 2 minutes Stand to Sit: Controls descent by using hands Transfers: Able to transfer safely, definite need of hands Standing Unsupported with Eyes Closed: Able to stand 10 seconds with supervision Standing Ubsupported with Feet Together: Needs help to attain position and unable to hold for 15 seconds From Standing, Reach Forward with Outstretched Arm: Reaches forward but needs supervision From Standing Position, Pick up Object from Floor: Unable to try/needs assist to keep balance From Standing Position, Turn to Look Behind Over each Shoulder: Needs assist to keep from losing balance and falling Turn 360 Degrees: Needs assistance while turning Standing Unsupported, Alternately Place Feet on Step/Stool: Able to complete >2 steps/needs minimal assist Standing Unsupported, One Foot in Front: Loses balance while stepping or standing Standing on One Leg: Unable to try or needs assist to prevent fall Total Score: 21 Extremity Assessment      RLE Assessment RLE Assessment: Within Functional Limits LLE Assessment LLE Assessment:  (grossly 3+/5, functional hip weakness during gait)   See Function Navigator for Current  Functional Status.  Harlo Jaso 09/21/2016, 3:19 PM

## 2016-09-21 NOTE — Progress Notes (Signed)
[facility] PHYSICAL MEDICINE & REHABILITATION     PROGRESS NOTE  Subjective/Complaints:  Pt seen laying in bed this AM.  She slept well overnight, continues to note improvement in sensation and is looking forward to discharge tomorrow.  ROS: Denies nausea, vomiting, diarrhea, shortness of breath or chest pain   Objective: Vital Signs: Blood pressure (!) 146/73, pulse 72, temperature 97.6 F (36.4 C), temperature source Oral, resp. rate 18, height 5\' 1"  (1.549 m), weight 77.5 kg (170 lb 14.4 oz), SpO2 99 %. No results found. No results for input(s): WBC, HGB, HCT, PLT in the last 72 hours. No results for input(s): NA, K, CL, GLUCOSE, BUN, CREATININE, CALCIUM in the last 72 hours.  Invalid input(s): CO CBG (last 3)  No results for input(s): GLUCAP in the last 72 hours.  Wt Readings from Last 3 Encounters:  09/18/16 77.5 kg (170 lb 14.4 oz)  09/04/16 84.2 kg (185 lb 10 oz)  09/01/16 76.7 kg (169 lb)    Physical Exam:  BP (!) 146/73 (BP Location: Left Arm)   Pulse 72   Temp 97.6 F (36.4 C) (Oral)   Resp 18   Ht 5\' 1"  (1.549 m)   Wt 77.5 kg (170 lb 14.4 oz)   SpO2 99%   BMI 32.29 kg/m  Constitutional: She appears well-developed. Obese  HENT: Normocephalic. Left crani incision healing  Eyes: No discharge. EOMI. Neck: Normal range of motion. Neck supple.  Cardiovascular:  RRR. No JVD. Respiratory: CTA Bilaterally. Normal effort   GI: Soft. Bowel sounds are normal.   Musculoskeletal: She exhibits no edema or tenderness.  Neurological: She is alert and oriented.  Significant facial weakness on left with left facial sensory deficits.  Mild dysarthria  Able to follow one and two step commands without difficulty.  LUE with ataxia.  Motor: RUE/RLE: 5/5 proximal to distal LUE/LLE: 5/5 proximal to distal  Skin: Skin is warm and dry.  Psychiatric: She has a normal mood and affect. Her behavior is normal. Judgment and thought content normal.    Assessment/Plan: 1. 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 Physiatrist is providing close team supervision and 24 hour management of active medical problems listed below. Physiatrist and rehab team continue to assess barriers to discharge/monitor patient progress toward functional and medical goals.  Function:   Bathing Bathing position   Position: Shower  Bathing parts Body parts bathed by patient: Right arm, Left arm, Chest, Abdomen, Front perineal area, Buttocks, Right upper leg, Left upper leg, Right lower leg, Left lower leg, Back    Bathing assist Assist Level: Supervision or verbal cues      Upper Body Dressing/Undressing Upper body dressing   What is the patient wearing?: Pull over shirt/dress     Pull over shirt/dress - Perfomed by patient: Thread/unthread right sleeve, Thread/unthread left sleeve, Put head through opening, Pull shirt over trunk          Upper body assist Assist Level: More than reasonable time   Set up : To obtain clothing/put away  Lower Body Dressing/Undressing Lower body dressing   What is the patient wearing?: Pants, Shoes, [employer]  - Performed by patient: Thread/unthread right underwear leg, Thread/unthread left underwear leg, Pull underwear up/down Underwear - Performed by helper: Pull underwear up/down Pants- Performed by patient: Thread/unthread right pants leg, Thread/unthread left pants leg, Pull pants up/down Pants- Performed by helper:  Pull pants up/down     Socks - Performed by patient: Don/doff right sock, Don/doff left sock   Shoes - Performed by patient: Don/doff right shoe, Don/doff left shoe, Fasten right, Fasten left       TED Hose - Performed by patient: Don/doff right TED hose, Don/doff left TED hose TED Hose - Performed by helper: Don/doff right TED hose, Don/doff left TED hose   Lower body assist Assist for lower body dressing: Supervision or verbal cues   Set up : To obtain clothing/put away  Toileting Toileting   Toileting steps completed by patient: Adjust clothing prior to toileting, Performs perineal hygiene, Adjust clothing after toileting Toileting steps completed by helper: Adjust clothing prior to toileting, Adjust clothing after toileting Toileting Assistive Devices: Grab bar or rail  Toileting assist Assist level: More than reasonable time   Transfers Chair/bed transfer   Chair/bed transfer method: Stand pivot, Ambulatory Chair/bed transfer assist level: Supervision or verbal cues Chair/bed transfer assistive device: Armrests, Medical sales representative     Max distance: 125 Assist level: Touching or steadying assistance (Pt > 75%)   Wheelchair   Type: Manual Max wheelchair distance: 150 Assist Level: Supervision or verbal cues  Cognition Comprehension Comprehension assist level: Follows complex conversation/direction with no assist  Expression Expression assist level: Expresses complex ideas: With no assist  Social Interaction Social Interaction assist level: Interacts appropriately with others - No medications needed.  Problem Solving Problem solving assist level: Solves complex problems: Recognizes & self-corrects  Memory Memory assist level: Complete Independence: No helper    Medical Problem List and Plan: 1.   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  Cont CIR, plan for d/c tomorrow  Appreciate Heme/Onc, Rad/Onc follow up.  Plan for referral to [facility] after discharge 2.  DVT Prophylaxis/Anticoagulation: Mechanical: Sequential compression devices, below knee Bilateral lower extremities and mobility.  3. Pain Management: hydrocodone prn effective at present.  4. Mood: LCSW to follow for evaluation and  support.  5. Neuropsych: This patient is capable of making decisions on her own behalf. 6. Skin/Wound Care: Monitor incision for healing. Maintain adequate nutritional and hydration status.  7. Fluids/Electrolytes/Nutrition: Monitor I/Os  D3 thin, advance as tolerated, after repeat MBS as well 8. HTN: Monitor BP bid. Continue HCTZ and metoprolol  Overall controlled 7/23 Vitals:   09/20/16 2009 09/21/16 0537  BP: (!) 135/58 (!) 146/73  Pulse: 75 72  Resp:  18  Temp:  97.6 F (36.4 C)   9. Vestibular symptoms: Vestibular eval and treatment.  10. PAF: Monitor HR bid. Continue metoprolol.   EKG showing PACs  Orthostatic hypotension 11. CAD s/p PTCA/stent: On low dose ASA, metoprolol and lipitor  12. GERD: Continue Protonix.  13. Hypokalemia: Improved after KCL runs. Multifactorial--D/Ced IVF. Added supplement as on diuretic. Encourage intake.   K+ 4.4 on 7/18  Labs pending 14. Impaired left eye closure: Eye drops qid and patch eye at bedtime.  14. Diarrhea: Discontinued colace 15. Hypoalbuminemia  Supplement initiated 7/12 16. ABLA  Hb 11.9 on 7/18  Labs pending  Cont to monitor 17. Leukocytosis  Likely steroid induced  WBCs 14.1 on 7/18  Labs pending  Afebrile  LOS (Days) 12 A FACE TO FACE EVALUATION WAS PERFORMED  Ankit Lorie Phenix 09/21/2016 9:59 AM

## 2016-09-21 NOTE — Progress Notes (Signed)
Speech Language Pathology Discharge Summary  Patient Details  Name: Kimberly Vaughn MRN: [id number] Date of Birth: Mar 27, 1945  Today's Date: 09/21/2016 SLP Individual Time: 0710-0730 SLP Individual Time Calculation (min): 20 min   Skilled Therapeutic Interventions:  Skilled treatment session focused on dysphagia and speech communication goals. SLP facilitated session by answering all questions to satisfaction. Pt able ot recall all compensatory swallow and speech intelligibility strategies with independence. She reports that sensation is returning to face and "throat area." She also reports that she isn't burping as much as last week. Pt very encouraged by progress. Pt able to list foods that would be appropriate to eat at home.     Patient has met 5 of 5 long term goals.  Patient to discharge at overall Independent level.   Clinical Impression/Discharge Summary: Pt has made great progress during skilled ST sessions. As a result, she has met 5 of 5 LTGs. Pt is able to protect her airway despite eructions during liquids. Pt able to independently implement speech intelligibility strategies to achieve ~ 100% intelligibility at the complex conversation level in a noisey environment. Pt may wish to follow up with Outpatient ST to facilitate increase oral movements.    Recommendation:  Outpatient SLP    Reasons for discharge: Treatment goals met   Patient/Family Agrees with Progress Made and Goals Achieved: Yes   Function:  Eating Eating   Modified Consistency Diet: Yes Eating Assist Level: Swallowing techniques: self managed           Cognition Comprehension Comprehension assist level: Follows complex conversation/direction with no assist  Expression   Expression assist level: Expresses complex ideas: With no assist  Social Interaction Social Interaction assist level: Interacts appropriately with others - No medications needed.  Problem Solving Problem solving assist level: Solves  complex problems: Recognizes & self-corrects  Memory Memory assist level: Complete Independence: No helper   Felton Buczynski 09/21/2016, 8:54 AM

## 2016-09-21 NOTE — Progress Notes (Signed)
Physical Therapy Note  Patient Details  Name: Kimberly Vaughn MRN: [id number] Date of Birth: 03-31-1945 Today's Date: 09/21/2016    Time: 1120-1145 25 minutes  1:1 No c/o pain.  W/c mobility with bilat UEs 200' with supervision.  Sit to stands and transfers with RW with mod I.  Gait with RW in home and controlled environments with supervision.  Ramp negotiation with close supervision and cues for slowing down for safety.  Car transfer to simulated sedan with supervision.  Stair negotiation 12 stairs x 2 with supervision, cues for sequencing, pt able to carryover during session.    [name] 09/21/2016, 11:46 AM

## 2016-09-22 ENCOUNTER — Inpatient Hospital Stay (HOSPITAL_COMMUNITY): Payer: Medicare Other | Admitting: Physical Therapy

## 2016-09-22 MED ORDER — ARTIFICIAL TEARS OPHTHALMIC OINT: TOPICAL_OINTMENT | Freq: Every day | OPHTHALMIC | 0 refills | Status: DC

## 2016-09-22 MED ORDER — HYDROCHLOROTHIAZIDE 12.5 MG PO TABS: 12.5000 mg | ORAL_TABLET | Freq: Every morning | ORAL | 0 refills | Status: DC

## 2016-09-22 MED ORDER — ATORVASTATIN CALCIUM 80 MG PO TABS: ORAL_TABLET | ORAL | 0 refills | Status: DC

## 2016-09-22 MED ORDER — POTASSIUM CHLORIDE CRYS ER 20 MEQ PO TBCR: 20.0000 meq | EXTENDED_RELEASE_TABLET | Freq: Two times a day (BID) | ORAL | 0 refills | Status: DC

## 2016-09-22 MED ORDER — METOPROLOL TARTRATE 25 MG PO TABS: 25.0000 mg | ORAL_TABLET | Freq: Two times a day (BID) | ORAL | 0 refills | Status: DC

## 2016-09-22 MED ORDER — DEXAMETHASONE 4 MG PO TABS: 4.0000 mg | ORAL_TABLET | Freq: Three times a day (TID) | ORAL | 0 refills | Status: DC

## 2016-09-22 MED ORDER — NAPHAZOLINE-GLYCERIN 0.012-0.2 % OP SOLN: 1.0000 [drp] | Freq: Three times a day (TID) | OPHTHALMIC | 0 refills | Status: DC

## 2016-09-22 NOTE — Progress Notes (Signed)
[facility]IC[facility]ABILITATION     PROGRESS NOTE  Subjective/Complaints:  Pt seen sitting up in bed this AM.  She is in her normal good spirits.  She slept well overnight and is ready to go home.  She has questions about therapies at discharge.   ROS: Denies nausea, vomiting, diarrhea, shortness of breath or chest pain   Objective: Vital Signs: Blood pressure 130/65, pulse 64, temperature 98 F (36.7 C), temperature source Oral, resp. rate 17, height 5\' 1"  (1.549 m), weight 77.5 kg (170 lb 14.4 oz), SpO2 96 %. No results found.  Recent Labs  09/21/16 1357  WBC 14.1*  HGB 12.1  HCT 35.1*  PLT 222    Recent Labs  09/21/16 1357  NA 132*  K 4.0  CL 97*  GLUCOSE 225*  BUN 29*  CREATININE 0.77  CALCIUM 9.0   CBG (last 3)  No results for input(s): GLUCAP in the last 72 hours.  Wt Readings from Last 3 Encounters:  09/18/16 77.5 kg (170 lb 14.4 oz)  09/04/16 84.2 kg (185 lb 10 oz)  09/01/16 76.7 kg (169 lb)    Physical Exam:  BP 130/65 (BP Location: Left Arm)   Pulse 64   Temp 98 F (36.7 C) (Oral)   Resp 17   Ht 5\' 1"  (1.549 m)   Wt 77.5 kg (170 lb 14.4 oz)   SpO2 96%   BMI 32.29 kg/m  Constitutional: She appears well-developed. Obese  HENT: Normocephalic. Left crani incision healing  Eyes: No discharge. EOMI. Neck: Normal range of motion. Neck supple.  Cardiovascular:  RRR. No JVD. Respiratory: CTA Bilaterally. Normal effort   GI: Soft. Bowel sounds are normal.   Musculoskeletal: She exhibits no edema or tenderness.  Neurological: She is alert and oriented.  Significant facial weakness on left with left facial sensory deficits, improving.  Mild dysarthria  Able to follow one and two step commands without difficulty.  LUE with ataxia.  Motor: RUE/RLE: 5/5 proximal to distal LUE/LLE: 5/5 proximal to distal  Skin: Skin is warm and dry.  Psychiatric: She has a normal mood and affect. Her behavior is normal. Judgment and thought content normal.     Assessment/Plan: 1. 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 Physiatrist is providing close team supervision and 24 hour management of active medical problems listed below. Physiatrist and rehab team continue to assess barriers to discharge/monitor patient progress toward functional and medical goals.  Function:   Bathing Bathing position   Position: Shower  Bathing parts Body parts bathed by patient: Right arm, Left arm, Chest, Abdomen, Front perineal area, Buttocks, Right upper leg, Left upper leg, Right lower leg, Left lower leg, Back    Bathing assist Assist Level: More than reasonable time      Upper Body Dressing/Undressing Upper body dressing   What is the patient wearing?: Pull over shirt/dress     Pull over shirt/dress - Perfomed by patient: Thread/unthread right sleeve, Thread/unthread left sleeve, Put head through opening, Pull shirt over trunk          Upper body assist Assist Level: More than reasonable time   Set up : To obtain clothing/put away  Lower Body Dressing/Undressing Lower body dressing   What is the patient wearing?: Pants, Shoes, [employer]  - Performed by patient: Thread/unthread right underwear leg, Thread/unthread left underwear leg, Pull underwear up/down Underwear -  Performed by helper: Pull underwear up/down Pants- Performed by patient: Thread/unthread right pants leg, Thread/unthread left pants leg, Pull pants up/down Pants- Performed by helper: Pull pants up/down     Socks - Performed by patient: Don/doff right sock, Don/doff left sock   Shoes - Performed by patient: Don/doff right shoe, Don/doff left shoe, Fasten right, Fasten left       TED Hose - Performed by patient: Don/doff right TED hose, Don/doff left TED hose TED Hose - Performed by helper: Don/doff right  TED hose, Don/doff left TED hose  Lower body assist Assist for lower body dressing: More than reasonable time   Set up : To obtain clothing/put away  Toileting Toileting   Toileting steps completed by patient: Adjust clothing prior to toileting, Performs perineal hygiene, Adjust clothing after toileting Toileting steps completed by helper: Adjust clothing prior to toileting, Adjust clothing after toileting [place]: Grab bar or rail  Toileting assist Assist level: More than reasonable time   Transfers Chair/bed transfer   Chair/bed transfer method: Stand pivot, Ambulatory Chair/bed transfer assist level: No Help, no cues, assistive device, takes more than a reasonable amount of time Chair/bed transfer assistive device: Armrests, Medical sales representative     Max distance: 150 Assist level: Supervision or verbal cues   Wheelchair   Type: Manual Max wheelchair distance: 150 Assist Level: Supervision or verbal cues  Cognition Comprehension Comprehension assist level: Follows complex conversation/direction with no assist  Expression Expression assist level: Expresses complex ideas: With no assist  Social Interaction Social Interaction assist level: Interacts appropriately with others - No medications needed.  Problem Solving Problem solving assist level: Solves complex problems: Recognizes & self-corrects  Memory Memory assist level: Complete Independence: No helper    Medical Problem List and Plan: 1.   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  D/c today  Will see patient in 1-2 weeks for transitional care management  Appreciate Heme/Onc, Rad/Onc follow up.  Plan for referral to [facility] after discharge 2.  DVT Prophylaxis/Anticoagulation: Mechanical: Sequential compression devices, below knee Bilateral lower extremities and  mobility.  3. Pain Management: hydrocodone prn effective at present.  4. Mood: LCSW to follow for evaluation and support.  5. Neuropsych: This patient is capable of making decisions on her own behalf. 6. Skin/Wound Care: Monitor incision for healing. Maintain adequate nutritional and hydration status.  7. Fluids/Electrolytes/Nutrition: Monitor I/Os  D3 thin, advance as tolerated, after repeat MBS as well 8. HTN: Monitor BP bid. Continue HCTZ and metoprolol  Controlled 7/24 Vitals:   09/21/16 1537 09/22/16 0521  BP: 136/60 130/65  Pulse: 63 64  Resp: 18 17  Temp: 97.6 F (36.4 C) 98 F (36.7 C)   9. Vestibular symptoms: Vestibular eval and treatment.  10. PAF: Monitor HR bid. Continue metoprolol.   EKG showing PACs  Orthostatic hypotension 11. CAD s/p PTCA/stent: On low dose ASA, metoprolol and lipitor  12. GERD: Continue Protonix.  13. Hypokalemia: Improved after KCL runs. Multifactorial--D/Ced IVF. Added supplement as on diuretic. Encourage intake.   K+ 4.0 on 7/23 14. Impaired left eye closure: Eye drops qid and patch eye at bedtime.  14. Diarrhea: Discontinued colace 15. Hypoalbuminemia  Supplement initiated 7/12 16. ABLA: Resolved  Hb 12.1 on 7/23  Cont to monitor 17. Leukocytosis  Likely steroid induced  WBCs 14.1 on 7/23  Afebrile  LOS (Days) 13 A FACE TO  FACE EVALUATION WAS PERFORMED  Creig Landin Lorie Phenix 09/22/2016 9:05 AM

## 2016-09-22 NOTE — Discharge Summary (Signed)
Physician Discharge Summary  Patient ID: Kimberly Vaughn MRN: [id number] DOB/AGE: Jul 20, 1945 71 y.o.  Admit date: 09/09/2016 Discharge date: 09/22/2016  Discharge Diagnoses:  Principal Problem:   Brain mass Active Problems:   Coronary artery disease   Dysphagia   Hypoalbuminemia due to protein-calorie malnutrition (HCC)   Leucocytosis   Premature atrial complex   Steroid-induced hyperglycemia   Labile blood pressure   Orthostatic hypotension   CNS lymphoma (HCC)   Discharged Condition: stable   Significant Diagnostic Studies: N/A   Labs:  Basic Metabolic Panel: BMP Latest Ref Rng & Units 09/21/2016 09/16/2016 09/12/2016  Glucose 65 - 99 mg/dL 225(H) 140(H) 126(H)  BUN 6 - 20 mg/dL 29(H) 22(H) 21(H)  Creatinine 0.44 - 1.00 mg/dL 0.77 0.68 0.58  BUN/Creat Ratio 12 - 28 - - -  Sodium 135 - 145 mmol/L 132(L) 135 135  Potassium 3.5 - 5.1 mmol/L 4.0 4.4 4.5  Chloride 101 - 111 mmol/L 97(L) 101 102  CO2 22 - 32 mmol/L 26 27 24   Calcium 8.9 - 10.3 mg/dL 9.0 8.9 8.8(L)    CBC: CBC Latest Ref Rng & Units 09/21/2016 09/16/2016 09/12/2016  WBC 4.0 - 10.5 K/uL 14.1(H) 14.1(H) 15.8(H)  Hemoglobin 12.0 - 15.0 g/dL 12.1 11.9(L) 11.8(L)  Hematocrit 36.0 - 46.0 % 35.1(L) 33.9(L) 34.6(L)  Platelets 150 - 400 K/uL 222 290 299    CBG: No results for input(s): GLUCAP in the last 168 hours.  Brief HPI:    Kimberly Vaughn a 71 y.o.femalewith progressive left hearing loss and facial droop since 01/2016 with diagnosis of cochlear nerve complex affecitng seventh and eight nerve. History taken from chart review and patient. Patient with CAD and MI requiring stent 03/2016 therefore surgery postponed with close observation. Follow up MRI showed marked enhancement of brainstem nodule with left facial numbness involving all three distributions of trigeminal nerve and concerns of malignancy and she was cleared for surgery by cards. She was admitted on 09/04/16 for retrosigmoid craniectomy for tumor  resection by Dr. Kathyrn Sheriff. Swallow evaluation done due to significant facial weakness and numbness--regular,thins with chin tuck recommended to prevent aspiration/penetration. Post-op MRI reviewed, showing partial resection of brainstem tumor. Therapy ongoing and patient limited by left visual deficits, balance deficits with ataxic gait and issues with intermittent dizziness/nausea with activity.  CIR recommended due to deficits in mobility and ability to carry out ADL tasks   Hospital Course: Kimberly Vaughn was admitted to rehab 09/09/2016 for inpatient therapies to consist of PT, ST and OT at least three hours five days a week. Past admission physiatrist, therapy team and rehab RN have worked together to provide customized collaborative inpatient rehab.  She was advised to continue patching left eye at nights due to lid lag with impaired eye closure and sensory deficits. She has dizziness with activity and vestibular evaluation revealed central vestibular dysfunction affecting 7th and 8th cranial nerves. She was educated on habituation exercises and her symptoms have greatly improved.  She does report improvement in sensation left face as well as swallowing and decrease in tendency to pocket. She is currently tolerating diet without difficulty. Left crani incision is intact and healing well without signs/symptoms of infection.   Esophagogram was done due to concerns expressed by stasis thorough out esophagus on MBS done and concerns by ST. This was negative for stricture or obstruction with evidence of occasional tertiary contractions and normal study per radiology.  Follow up of lytes showed that hypokalemia has resolved with addition of [place] for  supplement. Blood pressures and heart rate have been controlled. She did report occasional palpitations without chest pain and EKG done revealing PACs.  Follow up CBC revealed that ABLA has resolved and leucocytosis has been persistent but without signs of  infection. Heme/Onc was consulted for input on further testing/treatment. Dr. Alen Blew recommended high dose methotrexate for systemic treatment and she was set up to follow up with Dr. Maylon [facility] at [facility].  Her mood has been stable and she has shown good motivation and progress with therapy. She is currently at supervision level and will continue to receive follow up [place], [place] and [facility] by [facility] after discharge.  She was discharged to home on 09/22/16 in improved condition.    Rehab course: During patient's stay in rehab weekly team conferences were held to monitor patient's progress, set goals and discuss barriers to discharge. She requires mod assist with basic selfcare tasks and mobility.  She required intermittent supervision for swallow precautions as well as supervision with cues to utilize speech strategies.  She has had improvement in activity tolerance, balance, postural control, as well as ability to compensate for deficits. She has had improvement in functional use and control of LUE  and  LLE. She is able to complete ADL tasks at modified independent level. She is modified independent for transfers  and is ambulating 150' X 2 with RW and supervision. BERG balance score shows high risk for falls at 21/56 and supervision is recommended with gait. Family education was completed regarding safety and all aspects of care.     Disposition: 01-Home or Self Care  Diet: Dysphagia 3, thins (via straw)  Special Instructions: 1. Continue to patch left eye at nights. Use eye drops every 1-2 hours during the day.   Discharge Instructions    Ambulatory referral to Physical Medicine Rehab    Complete by:  As directed    1-2 weeks transitional care appt     Allergies as of 09/22/2016      Reactions   No Known Allergies Other (See Comments)      Medication List    STOP taking these medications   BRILINTA 90 MG Tabs tablet Generic drug:  ticagrelor   HYDROcodone-acetaminophen 5-325 MG  tablet Commonly known as:  NORCO/VICODIN     TAKE these medications   artificial tears Oint ophthalmic ointment Commonly known as:  LACRILUBE Place into the left eye at bedtime.   aspirin EC 81 MG tablet Take 81 mg by mouth daily.   atorvastatin 80 MG tablet Commonly known as:  LIPITOR TAKE 1 TABLET BY MOUTH IN THE AFTERNOON   dexamethasone 4 MG tablet Commonly known as:  DECADRON Take 1 tablet (4 mg total) by mouth every 8 (eight) hours.   esomeprazole 40 MG capsule Commonly known as:  NEXIUM Take 40 mg by mouth daily.   hydrochlorothiazide 12.5 MG tablet Commonly known as:  HYDRODIURIL Take 1 tablet (12.5 mg total) by mouth every morning. What changed:  See the new instructions.   meclizine 25 MG tablet Commonly known as:  ANTIVERT Take 25 mg by mouth 3 (three) times daily as needed for dizziness.   metoprolol tartrate 25 MG tablet Commonly known as:  LOPRESSOR Take 1 tablet (25 mg total) by mouth 2 (two) times daily.   naphazoline-glycerin 0.012-0.2 % Soln Commonly known as:  CLEAR EYES Place 1-2 drops into the left eye 4 (four) times daily -  with meals and at bedtime.   nitroGLYCERIN 0.4 MG SL tablet  Commonly known as:  NITROSTAT Place 0.4 mg under the tongue every 5 (five) minutes as needed for chest pain.   potassium chloride SA 20 MEQ tablet Commonly known as:  K-DUR,KLOR-CON Take 1 tablet (20 mEq total) by mouth 2 (two) times daily.   Vitamin B-12 5000 MCG Subl Place 5,000 mcg under the tongue daily.   Vitamin D3 2000 units Tabs Take 2,000 Units by mouth daily.      Follow-up Information    Ernestene Kiel, MD Follow up on 09/29/2016.   Specialty:  Internal Medicine Why:  @ 10:30 am (hospital follow up appt) Contact information: [address] [place] [address] [phone]        Jamse Arn, MD Follow up.   Specialty:  Physical Medicine and Rehabilitation Why:  Office will call you with follow up appointment Contact  information: [address] [place] [address] [phone]        Vedia Coffer, MD Follow up on 09/24/2016.   Specialty:  Internal Medicine Why:  Appointment at 10:15 am Contact information: [address] [place] [address] [phone]        Consuella Lose, MD. Call in 1 day(s).   Specialty:  Neurosurgery Why:  for follow up appointment in 2-3 weeks.  Contact information: [address] [place] [address] [place] [address] [phone]           Signed: Bary Leriche 09/22/2016, 7:55 PM

## 2016-09-22 NOTE — Progress Notes (Signed)
Social Work  Discharge Note  The overall goal for the admission was met for:   Discharge location: Yes - home with spouse and daughter to provide assist as needed  Length of Stay: Yes - 13 days  Discharge activity level: Yes - mod independent  Home/community participation: Yes  Services provided included: MD, RD, PT, OT, SLP, RN, TR, Pharmacy, [place]: [facility] Medicare  Follow-up services arranged: Home Health: PT, OT, ST via [facility], DME: 18x18 lightweight w/c, cushion, rolling walker via [place] and Patient/Family has no preference for HH/DME agencies  Comments (or additional information):  Patient/Family verbalized understanding of follow-up arrangements: Yes  Individual responsible for coordination of the follow-up plan: pt  Confirmed correct DME delivered: Jorgen Wolfinger 09/22/2016    Gerrard Crystal

## 2016-09-22 NOTE — Discharge Instructions (Signed)
°  Inpatient Rehab Discharge Instructions  [facility] Discharge date and time: 09/22/16    Activities/Precautions/ Functional Status: Activity: no lifting, driving, or strenuous exercise till cleared by MD Diet: low fat, low cholesterol diet Wound Care: keep wound clean and dry   Functional status:  ___ No restrictions     ___ Walk up steps independently _X__ 24/7 supervision/assistance   ___ Walk up steps with assistance ___ Intermittent supervision/assistance  ___ Bathe/dress independently ___ Walk with walker     _X__ Bathe/dress with assistance ___ Walk Independently    ___ Shower independently _X__ Walk with assistance    ___ Shower with assistance _X__ No alcohol     ___ Return to work/school ________  Special Instructions:  COMMUNITY REFERRALS UPON DISCHARGE:    Home Health:   PT     OT     ST                         Agency:  [facility] Phone: [phone]  Medical Equipment/Items Ordered:  Wheelchair, cushion, rolling walker                                                      Agency/Supplier:  [facility] @ [phone]    My questions have been answered and I understand these instructions. I will adhere to these goals and the provided educational materials after my discharge from the hospital.  Patient/Caregiver Signature _______________________________ Date __________  Clinician Signature _______________________________________ Date __________  Please bring this form and your medication list with you to all your follow-up doctor's appointments.

## 2016-09-24 DIAGNOSIS — C8589 Other specified types of non-Hodgkin lymphoma, extranodal and solid organ sites: Secondary | ICD-10-CM

## 2016-09-24 HISTORY — DX: Other specified types of non-hodgkin lymphoma, extranodal and solid organ sites: C85.89

## 2016-10-08 ENCOUNTER — Encounter: Payer: Medicare Other | Admitting: Physical Medicine & Rehabilitation

## 2016-10-28 LAB — BASIC METABOLIC PANEL
BUN: 17 (ref 4–21)
CREATININE: 0.6 (ref 0.5–1.1)
GLUCOSE: 86
Potassium: 3.8 (ref 3.4–5.3)
Sodium: 136 — AB (ref 137–147)

## 2016-10-28 LAB — HEPATIC FUNCTION PANEL
ALK PHOS: 73 (ref 25–125)
ALT: 57 — AB (ref 7–35)
AST: 19 (ref 13–35)
Bilirubin, Total: 0.7

## 2016-11-01 LAB — CBC AND DIFFERENTIAL
HCT: 20 — AB (ref 36–46)
Hemoglobin: 7.2 — AB (ref 12.0–16.0)
PLATELETS: 262 (ref 150–399)
WBC: 10.2

## 2016-11-06 LAB — BASIC METABOLIC PANEL
BUN: 12 (ref 4–21)
Creatinine: 0.3 — AB (ref 0.5–1.1)
GLUCOSE: 206
Potassium: 4.7 (ref 3.4–5.3)
Sodium: 140 (ref 137–147)

## 2016-11-06 LAB — CBC AND DIFFERENTIAL
HEMATOCRIT: 22 — AB (ref 36–46)
HEMOGLOBIN: 7.9 — AB (ref 12.0–16.0)
PLATELETS: 364 (ref 150–399)
WBC: 5.6

## 2016-11-09 ENCOUNTER — Non-Acute Institutional Stay (SKILLED_NURSING_FACILITY): Payer: Medicare Other | Admitting: Internal Medicine

## 2016-11-09 ENCOUNTER — Encounter: Payer: Self-pay | Admitting: Internal Medicine

## 2016-11-09 DIAGNOSIS — I10 Essential (primary) hypertension: Secondary | ICD-10-CM

## 2016-11-09 DIAGNOSIS — B9689 Other specified bacterial agents as the cause of diseases classified elsewhere: Secondary | ICD-10-CM | POA: Diagnosis not present

## 2016-11-09 DIAGNOSIS — R7881 Bacteremia: Secondary | ICD-10-CM

## 2016-11-09 DIAGNOSIS — E785 Hyperlipidemia, unspecified: Secondary | ICD-10-CM

## 2016-11-09 DIAGNOSIS — D61818 Other pancytopenia: Secondary | ICD-10-CM | POA: Diagnosis not present

## 2016-11-09 DIAGNOSIS — C8339 Primary central nervous system lymphoma: Secondary | ICD-10-CM

## 2016-11-09 DIAGNOSIS — B9561 Methicillin susceptible Staphylococcus aureus infection as the cause of diseases classified elsewhere: Secondary | ICD-10-CM

## 2016-11-09 DIAGNOSIS — R600 Localized edema: Secondary | ICD-10-CM | POA: Diagnosis not present

## 2016-11-09 DIAGNOSIS — R6520 Severe sepsis without septic shock: Secondary | ICD-10-CM | POA: Diagnosis not present

## 2016-11-09 DIAGNOSIS — N3090 Cystitis, unspecified without hematuria: Secondary | ICD-10-CM | POA: Diagnosis not present

## 2016-11-09 DIAGNOSIS — K219 Gastro-esophageal reflux disease without esophagitis: Secondary | ICD-10-CM

## 2016-11-09 DIAGNOSIS — D531 Other megaloblastic anemias, not elsewhere classified: Secondary | ICD-10-CM | POA: Diagnosis not present

## 2016-11-09 DIAGNOSIS — A419 Sepsis, unspecified organism: Secondary | ICD-10-CM

## 2016-11-09 DIAGNOSIS — C8589 Other specified types of non-Hodgkin lymphoma, extranodal and solid organ sites: Secondary | ICD-10-CM

## 2016-11-09 NOTE — Progress Notes (Signed)
: Provider:  Noah Delaine. Sheppard Coil, MD Location:  [facility] Room Number: 112 Place of Service:  SNF ([phone])  PCP: Ernestene Kiel, MD Patient Care Team: Ernestene Kiel, MD as PCP - General (Internal Medicine)  Extended Emergency Contact Information Primary Emergency Contact: Alicia of [place] Phone: [phone] Relation: Daughter     Allergies: No known allergies  Chief Complaint  Patient presents with  . New Admit To SNF    following hospitalization [facility] 10/24/16 to 11/06/16 fever and weakness    HPI: Patient is 71 y.o. 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 In ED she was febrile 102, hypertensive 71/44. Patient was started on vancomycin and Zosyn and IV fluids push presumed sepsis and admitted to the hospital from 8/25-9/7 for MSSA bacteremia. Hospital course was further complicated by a Klebsiella UTI and the fact that the patient was pancytopenic. The patient did require 1 unit of packed RBCs for hemoglobin of 7.1 patient has swelling of bilateral lower extremities and a Dopplers were obtained and these were negative for DVT. He was admitted to skilled nursing facility to continue IV Ancef until 11/27/16 and for OT/PT while at skilled nursing facility patient will be followed for hyperlipidemia treated with Lipitor hypertension treated with metoprolol and GERD treated with Nexium. Past Medical History:  Diagnosis Date  . A-fib ([place])   . Anginal pain ([place])   . Asthma    as a child  . BPPV (benign paroxysmal positional vertigo) 12/25/2015  . Brain tumor ([place]) 09/04/2016  . Coronary artery disease   . DDD (degenerative disc disease), lumbar   . Degenerative joint disease   .  Dizziness and giddiness   . Dysrhythmia    A fib  . Episodic atrial fibrillation ([place])   . Esophageal reflux   . GERD (gastroesophageal reflux disease)   . Hiatal hernia   . History of kidney stones   . Hypertension, essential   . Kidney stones   . Liver cyst LEFT  . Myocardial infarction ([place])   . Nodule of parotid gland   . Paroxysmal atrial fibrillation ([place]) 09/01/2016   Chads2Vas score equals 3, but only 1 episodes in 2016, recent event recorder showing no evidence of atrial fibrillation.  . Pneumonia   . PONV (postoperative nausea and vomiting)   . Pulmonary nodule   . PVC (premature ventricular contraction)    Nov 2017 & Feb 2018 noted on cardiac monitor worn at home    Past Surgical History:  Procedure Laterality Date  . APPLICATION OF CRANIAL NAVIGATION N/A 09/04/2016   Procedure: APPLICATION OF CRANIAL NAVIGATION;  Surgeon: Consuella Lose, MD;  Location: [facility];  Service: Neurosurgery;  Laterality: N/A;  . BRAIN SURGERY  09/04/2016  . BREAST BIOPSY Bilateral   . CARPAL TUNNEL RELEASE Right 09/2013  . CATARACTS Bilateral   . CERVICAL SPINE SURGERY  04/2012  . FINGER SURGERY Right    TRIGGER FINGER RELEASE  . PARTIAL THYMECTOMY  1973   pt states doesn't remember this being done  . RETROSIGMOID CRANIECTOMY FOR TUMOR RESECTION Left 09/04/2016   Procedure: RETROSIGMOID CRANIECTOMY FOR TUMOR RESECTION WITH BRAINLAB NAVIGATION;  Surgeon: Consuella Lose, MD;  Location: [facility];  Service: Neurosurgery;  Laterality: Left;  CRANIOTOMY TUMOR EXCISION  . TONSILLECTOMY  [place]    Allergies as of 11/09/2016      Reactions   No Known Allergies Other (See Comments)      Medication List       Accurate as of 11/09/16  3:17 PM. Always use your most recent med list.          aspirin EC 81 MG tablet Take 81 mg by mouth daily.   Carboxymethylcellulose Sod PF 0.5 % Soln Apply to eye. One drop in left eye 4 times daily. Do not use with 10 minutes of  Vigamox   ceFAZolin IVPB Commonly known as:  ANCEF Inject into the vein. Infuse 50 mls (2 g total) into the vein every 8 hours. Last dose 11/27/16   esomeprazole 40 MG capsule Commonly known as:  NEXIUM Take 40 mg by mouth daily.   lidocaine 2 % solution Commonly known as:  XYLOCAINE Use as directed 20 mLs in the mouth or throat. Take 30 ml by mouth 4 times daily   meclizine 25 MG tablet Commonly known as:  ANTIVERT Take 25 mg by mouth 3 (three) times daily as needed for dizziness.   metoprolol tartrate 25 MG tablet Commonly known as:  LOPRESSOR Take 1 tablet (25 mg total) by mouth 2 (two) times daily.   moxifloxacin 0.5 % ophthalmic solution Commonly known as:  VIGAMOX 1 drop. Place one drop in left eye 4 times daily   nitroGLYCERIN 0.4 MG SL tablet Commonly known as:  NITROSTAT Place 0.4 mg under the tongue every 5 (five) minutes as needed for chest pain.   ondansetron 8 MG tablet Commonly known as:  ZOFRAN Take 8 mg by mouth. Take one tablet every 8 hours as needed for nausea   potassium chloride SA 20 MEQ tablet Commonly known as:  K-DUR,KLOR-CON Take 1 tablet (20 mEq total) by mouth 2 (two) times daily.   Vitamin B-12 5000 MCG Subl Place 5,000 mcg under the tongue daily.   Vitamin D3 2000 units Tabs Take 2,000 Units by mouth daily.            Discharge Care Instructions        Start     Ordered   11/09/16 0000  CBC and differential    Comments:  This external order was created through the Results Console.    11/09/16 1435   11/09/16 1610  Basic metabolic panel    Comments:  This external order was created through the Results Console.    11/09/16 1435   11/09/16 0000  Hepatic function panel    Comments:  This external order was created through the Results Console.    11/09/16 1435   11/09/16 0000  CBC and differential    Comments:  This external order was created through the Results Console.    11/09/16 1437   11/09/16 9604  Basic metabolic  panel    Comments:  This external order was created through the Results Console.    11/09/16 1437      Meds ordered this encounter  Medications  . ceFAZolin (ANCEF) IVPB    Sig: Inject into the vein. Infuse 50 mls (2 g total) into the vein every 8 hours. Last dose 11/27/16  . Carboxymethylcellulose Sod PF 0.5 % SOLN    Sig: Apply to eye. One drop in left eye 4 times daily. Do not use with 10 minutes of Vigamox  . moxifloxacin (VIGAMOX) 0.5 % ophthalmic solution    Sig: 1 drop.  Place one drop in left eye 4 times daily  . lidocaine (XYLOCAINE) 2 % solution    Sig: Use as directed 20 mLs in the mouth or throat. Take 30 ml by mouth 4 times daily  . ondansetron (ZOFRAN) 8 MG tablet    Sig: Take 8 mg by mouth. Take one tablet every 8 hours as needed for nausea    Immunization History  Administered Date(s) Administered  . PPD Test 11/06/2016    Social History  Substance Use Topics  . Smoking status: Former Smoker    Types: Cigarettes    Quit date: 10/23/2005  . Smokeless tobacco: Never Used  . Alcohol use No    Family history is   Family History  Problem Relation Age of Onset  . Heart disease Mother 46  . Psoriasis Mother        PUSTULAR  . Hypertension Mother   . Rheum arthritis Mother   . Heart attack Father 80  . Heart disease Father   . Lymphoma Sister   . Hypertension Sister   . Melanoma Brother   . Hypertension Brother   . Rheum arthritis Brother   . Hypertension Daughter   . Lymphoma Other   . Osteoarthritis Other   . Melanoma Other   . Hypertension Other       Review of Systems  DATA OBTAINED: from patient, nurse GENERAL:  no fevers, fatigue, appetite changes SKIN: No itching, or rash EYES: No eye pain, redness, discharge EARS: No earache, tinnitus, change in hearing NOSE: No congestion, drainage or bleeding  MOUTH/THROAT: No mouth or tooth pain, No sore throat RESPIRATORY: No cough, wheezing, SOB CARDIAC: No chest pain, palpitations, lower  extremity edema  GI: No abdominal pain, No N/V/D or constipation, No heartburn or reflux  GU: No dysuria, frequency or urgency, or incontinence  MUSCULOSKELETAL: No unrelieved bone/joint pain NEUROLOGIC: No headache, dizziness or focal weakness PSYCHIATRIC: No c/o anxiety or sadness   Vitals:   11/09/16 1218  BP: (!) 106/57  Pulse: 91  Resp: 20  Temp: (!) 97.1 F (36.2 C)    SpO2 Readings from Last 1 Encounters:  09/22/16 96%   Body mass index is 30.01 kg/m.     Physical Exam  GENERAL APPEARANCE: Alert, conversant,  No acute distress.  SKIN: No diaphoresis rash; patient has very dry skin  HEAD: Normocephalic, atraumatic  EYES: Conjunctiva/lids clear. Pupils round, reactive. EOMs intact.  EARS: External exam WNL, canals clear. Hearing grossly normal.  NOSE: No deformity or discharge.  MOUTH/THROAT: Lips w/o lesions  RESPIRATORY: Breathing is even, unlabored. Lung sounds are clear   CARDIOVASCULAR: Heart RRR no murmurs, rubs or gallops. No peripheral edema.   GASTROINTESTINAL: Abdomen is soft, non-tender, not distended w/ normal bowel sounds. GENITOURINARY: Bladder non tender, not distended  MUSCULOSKELETAL: No abnormal joints or musculature NEUROLOGIC:  Cranial nerves 2-12 grossly intact. Moves all extremities  PSYCHIATRIC: Mood and affect appropriate to situation, no behavioral issues  Patient Active Problem List   Diagnosis Date Noted  . CNS lymphoma ([place])   . Diffuse large B cell lymphoma ([place]) 09/16/2016  . Orthostatic hypotension   . Premature atrial complex   . Steroid-induced hyperglycemia   . Labile blood pressure   . Dysphagia   . PVC (premature ventricular contraction)   . Hypoalbuminemia due to protein-calorie malnutrition ([place])   . Leucocytosis   . Brain mass 09/09/2016  . Ataxia   . Benign essential HTN   . PAF (paroxysmal atrial fibrillation) ([place])   .  Gastroesophageal reflux disease   . Diarrhea   . History of MI (myocardial infarction)   .  Acute blood loss anemia   . Hypokalemia   . Post-operative pain   . Mass of brain 09/04/2016  . Brain tumor ([place]) 09/04/2016  . Coronary artery disease 09/01/2016  . Paroxysmal atrial fibrillation ([place]) 09/01/2016  . Essential hypertension 09/01/2016  . BPPV (benign paroxysmal positional vertigo) 12/25/2015      Labs reviewed: Basic Metabolic Panel:    Component Value Date/Time   NA 140 11/06/2016   K 4.7 11/06/2016   CL 97 (L) 09/21/2016 1357   CO2 26 09/21/2016 1357   GLUCOSE 225 (H) 09/21/2016 1357   BUN 12 11/06/2016   CREATININE 0.3 (A) 11/06/2016   CREATININE 0.77 09/21/2016 1357   CALCIUM 9.0 09/21/2016 1357   PROT 4.9 (L) 09/10/2016 0518   PROT 6.4 07/22/2016 1247   ALBUMIN 3.0 (L) 09/10/2016 0518   ALBUMIN 4.5 07/22/2016 1247   AST 19 10/28/2016   ALT 57 (A) 10/28/2016   ALKPHOS 73 10/28/2016   BILITOT 0.8 09/10/2016 0518   BILITOT 0.7 07/22/2016 1247   GFRNONAA >60 09/21/2016 1357   GFRAA >60 09/21/2016 1357     Recent Labs  09/07/16 0342 09/08/16 0334  09/12/16 0623 09/16/16 0455 09/21/16 1357 10/28/16 11/06/16  NA 139 139  < > 135 135 132* 136* 140  K 2.8* 3.1*  < > 4.5 4.4 4.0 3.8 4.7  CL 101 102  < > 102 101 97*  --   --   CO2 30 28  < > 24 27 26   --   --   GLUCOSE 148* 134*  < > 126* 140* 225*  --   --   BUN 12 15  < > 21* 22* 29* 17 12  CREATININE 0.60 0.54  < > 0.58 0.68 0.77 0.6 0.3*  CALCIUM 8.6* 8.6*  < > 8.8* 8.9 9.0  --   --   MG 1.9 2.0  --   --   --   --   --   --   PHOS 2.3*  --   --   --   --   --   --   --   < > = values in this interval not displayed. Liver Function Tests:  Recent Labs  07/22/16 1247 09/10/16 0518 10/28/16  AST 14 19 19   ALT 29 24 57*  ALKPHOS 68 57 73  BILITOT 0.7 0.8  --   PROT 6.4 4.9*  --   ALBUMIN 4.5 3.0*  --    No results for input(s): LIPASE, AMYLASE in the last 8760 hours. No results for input(s): AMMONIA in the last 8760 hours. CBC:  Recent Labs  09/10/16 0518 09/12/16 0623  09/16/16 0455 09/21/16 1357 11/01/16 11/06/16  WBC 11.6* 15.8* 14.1* 14.1* 10.2 5.6  NEUTROABS 8.8* 12.3*  --  10.9*  --   --   HGB 11.2* 11.8* 11.9* 12.1 7.2* 7.9*  HCT 32.6* 34.6* 33.9* 35.1* 20* 22*  MCV 96.2 96.4 95.8 95.4  --   --   PLT 240 299 290 222 262 364   Lipid No results for input(s): CHOL, HDL, LDLCALC, TRIG in the last 8760 hours.  Cardiac Enzymes: No results for input(s): CKTOTAL, CKMB, CKMBINDEX, TROPONINI in the last 8760 hours. BNP: No results for input(s): BNP in the last 8760 hours. No results found for: MICROALBUR No results found for: HGBA1C No results found for: TSH No results found  for: VITAMINB12 No results found for: FOLATE No results found for: IRON, TIBC, FERRITIN  Imaging and Procedures obtained prior to SNF admission: No results found.   Not all labs, radiology exams or other studies done during hospitalization come through on my EPIC note; however they are reviewed by me.    Assessment and Plan  SEVERE SEPSIS/MSSA BACTEREMIA-patient had a port placement on 8/3. Patient had positive blood cultures from peripheral and port growing staph off aureus that was oxacillin susceptible patient was initially continued on vancomycin and Zosyn but D escalated IV oxacillin on 8/27 given susceptibilities the patient was transitioned from IV oxacillin to IV cefazolin on 8/28 2 go through 11/27/16. TTE on 8/28 showed an EF of 55-60% no wall motion abnormalities and no vegetations. Peripheral and central blood cultures from 827 through 8/30 remained positive for MSSA. Given persistently positive blood cultures in the setting of indwelling central catheter infectious disease was consulted and patient's catheter was removed by interventional radiology on 9/5. Infectious disease recommended 4 weeks of IV antibiotics being cefazolin ending on 11/27/16 SNF - admitted with generalized weakness for OT PT and for IV cefazolin for 4 weeks ending on 8/28-18  KLEBSIELLA  UTI-patient received a full course of ciprofloxacin while in the hospital with an end date of 8/28  PANCYTOPENIA/CNS LYMPHOMA/ACUTE ANEMIA-the patient continued to receive rituxan and infusions throughout her hospital stay, methotrexate infusions were held secondary to bacteremia. On day of discharge patient's WBC was 5.6, hemoglobin 7.9, and platelets 364. The patient did require 1 unit packed RBC on 9/3 for hemoglobin of 7.1 SNF - will follow-up CBC  BILATERAL LOWER EXTREMITY SWELLING-both legs had Dopplers done on them and both legs were negative for DVT  GERD SNF - not stated as uncontrolled; continue Nexium 40 mg by mouth daily  HYPERTENSION SNF -controlled plan to continue metoprolol 25 mg by mouth twice a day  HYPERLIPIDEMIA SNF - not stated as uncontrolled; plan to continue Lipitor 80 mg by mouth daily   Time spent greater than 45 minutes;> 50% of time with patient was spent reviewing records, labs, tests and studies, counseling and developing plan of care  Webb Silversmith D. Sheppard Coil, MD

## 2016-11-11 LAB — BASIC METABOLIC PANEL
BUN: 9 (ref 4–21)
Creatinine: 0.4 — AB (ref 0.5–1.1)
Glucose: 93
Potassium: 3.8 (ref 3.4–5.3)
Sodium: 140 (ref 137–147)

## 2016-11-11 LAB — POCT ERYTHROCYTE SEDIMENTATION RATE, NON-AUTOMATED: Sed Rate: 45

## 2016-11-11 LAB — CBC AND DIFFERENTIAL
HEMATOCRIT: 25 — AB (ref 36–46)
HEMOGLOBIN: 8.2 — AB (ref 12.0–16.0)
Platelets: 358 (ref 150–399)
WBC: 8.1

## 2016-11-11 LAB — HEPATIC FUNCTION PANEL
ALK PHOS: 82 (ref 25–125)
ALT: 6 — AB (ref 7–35)
AST: 13 (ref 13–35)
Bilirubin, Total: 0.6

## 2016-11-12 ENCOUNTER — Encounter: Payer: Self-pay | Admitting: Internal Medicine

## 2016-11-12 DIAGNOSIS — E785 Hyperlipidemia, unspecified: Secondary | ICD-10-CM | POA: Insufficient documentation

## 2016-11-12 DIAGNOSIS — B961 Klebsiella pneumoniae [K. pneumoniae] as the cause of diseases classified elsewhere: Secondary | ICD-10-CM

## 2016-11-12 DIAGNOSIS — R6520 Severe sepsis without septic shock: Secondary | ICD-10-CM

## 2016-11-12 DIAGNOSIS — D531 Other megaloblastic anemias, not elsewhere classified: Secondary | ICD-10-CM | POA: Insufficient documentation

## 2016-11-12 DIAGNOSIS — R600 Localized edema: Secondary | ICD-10-CM

## 2016-11-12 DIAGNOSIS — N3090 Cystitis, unspecified without hematuria: Secondary | ICD-10-CM

## 2016-11-12 DIAGNOSIS — D61818 Other pancytopenia: Secondary | ICD-10-CM | POA: Insufficient documentation

## 2016-11-12 DIAGNOSIS — A419 Sepsis, unspecified organism: Secondary | ICD-10-CM

## 2016-11-12 DIAGNOSIS — B9561 Methicillin susceptible Staphylococcus aureus infection as the cause of diseases classified elsewhere: Secondary | ICD-10-CM

## 2016-11-12 DIAGNOSIS — B9689 Other specified bacterial agents as the cause of diseases classified elsewhere: Secondary | ICD-10-CM | POA: Insufficient documentation

## 2016-11-12 DIAGNOSIS — R7881 Bacteremia: Secondary | ICD-10-CM

## 2016-11-12 HISTORY — DX: Severe sepsis without septic shock: R65.20

## 2016-11-12 HISTORY — DX: Sepsis, unspecified organism: A41.9

## 2016-11-12 HISTORY — DX: Methicillin susceptible Staphylococcus aureus infection as the cause of diseases classified elsewhere: B95.61

## 2016-11-12 HISTORY — DX: Cystitis, unspecified without hematuria: N30.90

## 2016-11-12 HISTORY — DX: Hyperlipidemia, unspecified: E78.5

## 2016-11-12 HISTORY — DX: Localized edema: R60.0

## 2016-11-12 HISTORY — DX: Other megaloblastic anemias, not elsewhere classified: D53.1

## 2016-11-12 HISTORY — DX: Klebsiella pneumoniae (k. pneumoniae) as the cause of diseases classified elsewhere: B96.1

## 2016-11-12 HISTORY — DX: Other pancytopenia: D61.818

## 2016-11-13 ENCOUNTER — Other Ambulatory Visit: Payer: Self-pay

## 2016-11-14 DIAGNOSIS — L565 Disseminated superficial actinic porokeratosis (DSAP): Secondary | ICD-10-CM | POA: Insufficient documentation

## 2016-11-14 HISTORY — DX: Disseminated superficial actinic porokeratosis (DSAP): L56.5

## 2016-11-16 ENCOUNTER — Non-Acute Institutional Stay (SKILLED_NURSING_FACILITY): Payer: Medicare Other | Admitting: Internal Medicine

## 2016-11-16 DIAGNOSIS — D496 Neoplasm of unspecified behavior of brain: Secondary | ICD-10-CM

## 2016-11-16 DIAGNOSIS — C8331 Diffuse large B-cell lymphoma, lymph nodes of head, face, and neck: Secondary | ICD-10-CM

## 2016-11-16 DIAGNOSIS — C8589 Other specified types of non-Hodgkin lymphoma, extranodal and solid organ sites: Secondary | ICD-10-CM | POA: Diagnosis not present

## 2016-11-16 DIAGNOSIS — H4922 Sixth [abducent] nerve palsy, left eye: Secondary | ICD-10-CM | POA: Diagnosis not present

## 2016-11-16 DIAGNOSIS — G510 Bell's palsy: Secondary | ICD-10-CM

## 2016-11-16 DIAGNOSIS — T451X5A Adverse effect of antineoplastic and immunosuppressive drugs, initial encounter: Secondary | ICD-10-CM

## 2016-11-16 DIAGNOSIS — G519 Disorder of facial nerve, unspecified: Secondary | ICD-10-CM

## 2016-11-16 DIAGNOSIS — I10 Essential (primary) hypertension: Secondary | ICD-10-CM | POA: Diagnosis not present

## 2016-11-16 DIAGNOSIS — E785 Hyperlipidemia, unspecified: Secondary | ICD-10-CM | POA: Diagnosis not present

## 2016-11-16 DIAGNOSIS — D6481 Anemia due to antineoplastic chemotherapy: Secondary | ICD-10-CM

## 2016-11-16 NOTE — Progress Notes (Signed)
: Provider:  Noah Delaine. Sheppard Coil, MD Location:  [place] Room Number: [phone] Place of Service:  SNF [phone])  PCP: Ernestene Kiel, MD Patient Care Team: Ernestene Kiel, MD as PCP - General (Internal Medicine)  Extended Emergency Contact Information Primary Emergency Contact: [place] Phone: [phone] Relation: Daughter     Allergies: No known allergies  Chief Complaint  Patient presents with  . Readmit To SNF    following hospitalization 11/12/16 to 11/15/16 Primary CNS lymphoma, scheduled admission for treatment with C2 HD-MTX.    HPI: Patient is 71 y.o. female with CNS lymphoma, recent history of MSSA, hypertension, coronary artery disease, atrial fib, who was admitted to [facility] from 9/13-16 for planned chemotherapy with methotrexate. Patient is a little more monitored and the patient was monitored daily for side effects and overall tolerated the chemotherapy well. She did require Diamox 250 mg 3 times a day for fluid overload. The patient's 48 hour methotrexate level returned as 0.17 and she was deemed stable for return back to her skilled nursing facility. While at skilled nursing facility patient will be followed for history of MSSA treated with cefazolin with end date 11/27/2016, hyperlipidemia treated with Lipitor and hypertension treated with metoprolol.  Past Medical History:  Diagnosis Date  . A-fib ([place])   . Anginal pain ([place])   . Asthma    as a child  . BPPV (benign paroxysmal positional vertigo) 12/25/2015  . Brain tumor ([place]) 09/04/2016  . Coronary artery disease   . DDD (degenerative disc disease), lumbar   . Degenerative joint disease   . Dizziness and giddiness   . Dysrhythmia    A fib  . Episodic atrial fibrillation ([place])   . Esophageal reflux   . GERD (gastroesophageal reflux disease)   . Hiatal hernia   . History of kidney stones   . Hypertension,  essential   . Kidney stones   . Liver cyst LEFT  . Myocardial infarction ([place])   . Nodule of parotid gland   . Paroxysmal atrial fibrillation ([place]) 09/01/2016   Chads2Vas score equals 3, but only 1 episodes in 2016, recent event recorder showing no evidence of atrial fibrillation.  . Pneumonia   . PONV (postoperative nausea and vomiting)   . Pulmonary nodule   . PVC (premature ventricular contraction)    Nov 2017 & Feb 2018 noted on cardiac monitor worn at home    Past Surgical History:  Procedure Laterality Date  . APPLICATION OF CRANIAL NAVIGATION N/A 09/04/2016   Procedure: APPLICATION OF CRANIAL NAVIGATION;  Surgeon: Consuella Lose, MD;  Location: [place];  Service: Neurosurgery;  Laterality: N/A;  . BRAIN SURGERY  09/04/2016  . BREAST BIOPSY Bilateral   . CARPAL TUNNEL RELEASE Right 09/2013  . CATARACTS Bilateral   . CERVICAL SPINE SURGERY  04/2012  . FINGER SURGERY Right    TRIGGER FINGER RELEASE  . PARTIAL THYMECTOMY  1973   pt states doesn't remember this being done  . RETROSIGMOID CRANIECTOMY FOR TUMOR RESECTION Left 09/04/2016   Procedure: RETROSIGMOID CRANIECTOMY FOR TUMOR RESECTION WITH BRAINLAB NAVIGATION;  Surgeon: Consuella Lose, MD;  Location: [place];  Service: Neurosurgery;  Laterality: Left;  CRANIOTOMY TUMOR EXCISION  . TONSILLECTOMY  1964  . VAGINAL HYSTERECTOMY  1974    Allergies as of 11/16/2016      Reactions   No Known Allergies Other (See Comments)      Medication List  Accurate as of 11/16/16 10:00 AM. Always use your most recent med list.          aspirin EC 81 MG tablet Take 81 mg by mouth daily.   Carboxymethylcellulose Sod PF 0.5 % Soln Apply to eye. One drop in left eye 4 times daily. Do not use with 10 minutes of Vigamox   ceFAZolin IVPB Commonly known as:  ANCEF Inject into the vein. Infuse 50 mls (2 g total) into the vein every 8 hours. Last dose 11/27/16   esomeprazole 40 MG capsule Commonly known as:  NEXIUM Take 40 mg by  mouth daily.   leucovorin 25 MG tablet Commonly known as:  WELLCOVORIN Take by mouth. give one tablet every 6 hours for a total of 6 doses   lidocaine 2 % solution Commonly known as:  XYLOCAINE Use as directed 20 mLs in the mouth or throat. Take 30 ml by mouth 4 times daily   metoprolol tartrate 25 MG tablet Commonly known as:  LOPRESSOR Take 1 tablet (25 mg total) by mouth 2 (two) times daily.   moxifloxacin 0.5 % ophthalmic solution Commonly known as:  VIGAMOX 1 drop. Place one drop in left eye 4 times daily   nitroGLYCERIN 0.4 MG SL tablet Commonly known as:  NITROSTAT Place 0.4 mg under the tongue every 5 (five) minutes as needed for chest pain.   ondansetron 8 MG tablet Commonly known as:  ZOFRAN Take 8 mg by mouth. Take one tablet every 8 hours as needed for nausea   Potassium Chloride ER 20 MEQ Tbcr Take by mouth. Take one tablet daily   Vitamin B-12 5000 MCG Subl Place 5,000 mcg under the tongue daily.   Vitamin D3 2000 units Tabs Take 2,000 Units by mouth daily.       Meds ordered this encounter  Medications  . Potassium Chloride ER 20 MEQ TBCR    Sig: Take by mouth. Take one tablet daily  . leucovorin (WELLCOVORIN) 25 MG tablet    Sig: Take by mouth. give one tablet every 6 hours for a total of 6 doses    Immunization History  Administered Date(s) Administered  . PPD Test 11/06/2016    Social History  Substance Use Topics  . Smoking status: Former Smoker    Types: Cigarettes    Quit date: 10/23/2005  . Smokeless tobacco: Never Used  . Alcohol use No    Family history is   Family History  Problem Relation Age of Onset  . Heart disease Mother 15  . Psoriasis Mother        PUSTULAR  . Hypertension Mother   . Rheum arthritis Mother   . Heart attack Father 37  . Heart disease Father   . Lymphoma Sister   . Hypertension Sister   . Melanoma Brother   . Hypertension Brother   . Rheum arthritis Brother   . Hypertension Daughter   . Lymphoma  Other   . Osteoarthritis Other   . Melanoma Other   . Hypertension Other       Review of Systems  DATA OBTAINED: from patient GENERAL:  no fevers, fatigue, appetite changes SKIN: No itching, or rash EYES: No eye pain, redness, discharge EARS: No earache, tinnitus, change in hearing NOSE: No congestion, drainage or bleeding  MOUTH/THROAT: No mouth or tooth pain, No sore throat RESPIRATORY: No cough, wheezing, SOB CARDIAC: No chest pain, palpitations, lower extremity edema  GI: No abdominal pain, No N/V/D or constipation, No heartburn or reflux  GU: No dysuria, frequency or urgency, or incontinence  MUSCULOSKELETAL: No unrelieved bone/joint pain NEUROLOGIC: No headache, dizziness or focal weakness PSYCHIATRIC: No c/o anxiety or sadness   Vitals:   11/16/16 0945  BP: 113/61  Pulse: 83  Resp: 20  Temp: (!) 97 F (36.1 C)  SpO2: 96%    SpO2 Readings from Last 1 Encounters:  11/16/16 96%   Body mass index is 30.73 kg/m.     Physical Exam  GENERAL APPEARANCE: Alert, conversant,  No acute distress.  SKIN: No diaphoresis rash HEAD: Normocephalic, atraumatic  EYES: Conjunctiva/lids clear. Pupils round, reactive. EOMs intact.  EARS: External exam WNL, canals clear. Hearing grossly normal.  NOSE: No deformity or discharge.  MOUTH/THROAT: Lips w/o lesions  RESPIRATORY: Breathing is even, unlabored. Lung sounds are clear   CARDIOVASCULAR: Heart RRR no murmurs, rubs or gallops. No peripheral edema.   GASTROINTESTINAL: Abdomen is soft, non-tender, not distended w/ normal bowel sounds. GENITOURINARY: Bladder non tender, not distended  MUSCULOSKELETAL: No abnormal joints or musculature NEUROLOGIC:  Cranial nerves left-sided facial weakness, cranial nerve V and VII palsy; Moves all extremities  PSYCHIATRIC: Mood and affect appropriate to situation, no behavioral issues  Patient Active Problem List   Diagnosis Date Noted  . Severe sepsis ([place]) 11/12/2016  . Bacteremia due to  methicillin susceptible Staphylococcus aureus (MSSA) 11/12/2016  . Klebsiella cystitis 11/12/2016  . Pancytopenia ([place]) 11/12/2016  . Acute megaloblastic anemia due to severe illness 11/12/2016  . Bilateral lower extremity edema 11/12/2016  . Hyperlipidemia 11/12/2016  . CNS lymphoma ([place])   . Diffuse large B cell lymphoma ([place]) 09/16/2016  . Orthostatic hypotension   . Premature atrial complex   . Steroid-induced hyperglycemia   . Labile blood pressure   . Dysphagia   . PVC (premature ventricular contraction)   . Hypoalbuminemia due to protein-calorie malnutrition ([place])   . Leucocytosis   . Brain mass 09/09/2016  . Ataxia   . Benign essential HTN   . PAF (paroxysmal atrial fibrillation) ([place])   . Gastroesophageal reflux disease   . Diarrhea   . History of MI (myocardial infarction)   . Acute blood loss anemia   . Hypokalemia   . Post-operative pain   . Mass of brain 09/04/2016  . Brain tumor ([place]) 09/04/2016  . Coronary artery disease 09/01/2016  . Paroxysmal atrial fibrillation ([place]) 09/01/2016  . Essential hypertension 09/01/2016  . BPPV (benign paroxysmal positional vertigo) 12/25/2015      Labs reviewed: Basic Metabolic Panel:    Component Value Date/Time   NA 140 11/11/2016   K 3.8 11/11/2016   CL 97 (L) 09/21/2016 1357   CO2 26 09/21/2016 1357   GLUCOSE 225 (H) 09/21/2016 1357   BUN 9 11/11/2016   CREATININE 0.4 (A) 11/11/2016   CREATININE 0.77 09/21/2016 1357   CALCIUM 9.0 09/21/2016 1357   PROT 4.9 (L) 09/10/2016 0518   PROT 6.4 07/22/2016 1247   ALBUMIN 3.0 (L) 09/10/2016 0518   ALBUMIN 4.5 07/22/2016 1247   AST 13 11/11/2016   ALT 6 (A) 11/11/2016   ALKPHOS 82 11/11/2016   BILITOT 0.8 09/10/2016 0518   BILITOT 0.7 07/22/2016 1247   GFRNONAA >60 09/21/2016 1357   GFRAA >60 09/21/2016 1357     Recent Labs  09/07/16 0342 09/08/16 0334  09/12/16 0623 09/16/16 0455 09/21/16 1357 10/28/16 11/06/16 11/11/16  NA 139 139  < > 135 135 132* 136*  140 140  K 2.8* 3.1*  < > 4.5 4.4 4.0 3.8 4.7 3.8  CL 101 102  < > 102 101 97*  --   --   --   CO2 30 28  < > 24 27 26   --   --   --   GLUCOSE 148* 134*  < > 126* 140* 225*  --   --   --   BUN 12 15  < > 21* 22* 29* 17 12 9   CREATININE 0.60 0.54  < > 0.58 0.68 0.77 0.6 0.3* 0.4*  CALCIUM 8.6* 8.6*  < > 8.8* 8.9 9.0  --   --   --   MG 1.9 2.0  --   --   --   --   --   --   --   PHOS 2.3*  --   --   --   --   --   --   --   --   < > = values in this interval not displayed. Liver Function Tests:  Recent Labs  07/22/16 1247 09/10/16 0518 10/28/16 11/11/16  AST 14 19 19 13   ALT 29 24 57* 6*  ALKPHOS 68 57 73 82  BILITOT 0.7 0.8  --   --   PROT 6.4 4.9*  --   --   ALBUMIN 4.5 3.0*  --   --    No results for input(s): LIPASE, AMYLASE in the last 8760 hours. No results for input(s): AMMONIA in the last 8760 hours. CBC:  Recent Labs  09/10/16 0518 09/12/16 0623 09/16/16 0455 09/21/16 1357 11/01/16 11/06/16 11/11/16  WBC 11.6* 15.8* 14.1* 14.1* 10.2 5.6 8.1  NEUTROABS 8.8* 12.3*  --  10.9*  --   --   --   HGB 11.2* 11.8* 11.9* 12.1 7.2* 7.9* 8.2*  HCT 32.6* 34.6* 33.9* 35.1* 20* 22* 25*  MCV 96.2 96.4 95.8 95.4  --   --   --   PLT 240 299 290 222 262 364 358   Lipid No results for input(s): CHOL, HDL, LDLCALC, TRIG in the last 8760 hours.  Cardiac Enzymes: No results for input(s): CKTOTAL, CKMB, CKMBINDEX, TROPONINI in the last 8760 hours. BNP: No results for input(s): BNP in the last 8760 hours. No results found for: MICROALBUR No results found for: HGBA1C No results found for: TSH No results found for: VITAMINB12 No results found for: FOLATE No results found for: IRON, TIBC, FERRITIN  Imaging and Procedures obtained prior to SNF admission: No results found.   Not all labs, radiology exams or other studies done during hospitalization come through on my EPIC note; however they are reviewed by me.    Assessment and Plan  PRIMARY CNS LYMPHOMA-patient was treated  with a 5 day course of methotrexate which was considered successful and which the patient tolerated well; patient did require Diamox 250 mg 3 times a day for fluid overload which is recommended to be used in the future SNF -admitted for continue cefazolin for MSSA bacteremia and for generalized weakness for OT/PT  LEFT CRANIAL NERVE 5 and CRANIAL NERVE 7 PALSY-patient is prominent closure she has not had significant improvement and she is a risk for corneal desiccation and possible perforation SNF --- continue Vigamox 4 times a day an artificial tears 4 times a day  CHEMOTHERAPY INDUCED ANEMIA-transfusion goal is less than 7 SNF - there are no labs available but will follow-up CBC to look for hemoglobin of less than 7  HYPERLIPIDEMIA SNF - stable; continue Lipitor 80 mg by mouth daily  HYPERTENSION SNF - stable; continue  Lopressor 25 mg by mouth twice a day   Time spent greater than 45 minutes;> 50% of time with patient was spent reviewing records, labs, tests and studies, counseling and developing plan of care  Noah Delaine. Sheppard Coil, MD

## 2016-11-19 DIAGNOSIS — I82622 Acute embolism and thrombosis of deep veins of left upper extremity: Secondary | ICD-10-CM

## 2016-11-19 HISTORY — DX: Acute embolism and thrombosis of deep veins of left upper extremity: I82.622

## 2016-11-24 ENCOUNTER — Encounter: Payer: Self-pay | Admitting: Internal Medicine

## 2016-11-24 ENCOUNTER — Non-Acute Institutional Stay (SKILLED_NURSING_FACILITY): Payer: Medicare Other | Admitting: Internal Medicine

## 2016-11-24 DIAGNOSIS — C8589 Other specified types of non-Hodgkin lymphoma, extranodal and solid organ sites: Secondary | ICD-10-CM | POA: Diagnosis not present

## 2016-11-24 DIAGNOSIS — R7881 Bacteremia: Secondary | ICD-10-CM | POA: Diagnosis not present

## 2016-11-24 DIAGNOSIS — K219 Gastro-esophageal reflux disease without esophagitis: Secondary | ICD-10-CM

## 2016-11-24 DIAGNOSIS — E559 Vitamin D deficiency, unspecified: Secondary | ICD-10-CM

## 2016-11-24 DIAGNOSIS — I82622 Acute embolism and thrombosis of deep veins of left upper extremity: Secondary | ICD-10-CM | POA: Diagnosis not present

## 2016-11-24 DIAGNOSIS — C8331 Diffuse large B-cell lymphoma, lymph nodes of head, face, and neck: Secondary | ICD-10-CM | POA: Diagnosis not present

## 2016-11-24 DIAGNOSIS — I25118 Atherosclerotic heart disease of native coronary artery with other forms of angina pectoris: Secondary | ICD-10-CM

## 2016-11-24 DIAGNOSIS — B9561 Methicillin susceptible Staphylococcus aureus infection as the cause of diseases classified elsewhere: Secondary | ICD-10-CM

## 2016-11-24 NOTE — Progress Notes (Signed)
: Provider:  Noah Delaine. Sheppard Coil, MD Location:  [facility] Room Number: 401 Place of Service:  SNF [phone]  PCP: Ernestene Kiel, MD Patient Care Team: Ernestene Kiel, MD as PCP - General (Internal Medicine)  Extended Emergency Contact Information Primary Emergency Contact: [place] Phone: [phone] Relation: Daughter     Allergies: No known allergies  Chief Complaint  Patient presents with  . Readmit To SNF    following hospitalization 11/19/16 to 11/21/16 left arm swelling , acute left extremity DVT associated with her PICC line    HPI: Patient is 71 y.o. 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 Patient was found to have an acute left upper extremity DVT associated with her PICC LINE and was admitted to [facility] from 9/20-21 for workup and replacement. Patient was started on Lovenox then transition to his erectile toe. Her left arm PICC LINE was removed and she had a new PICC LINE placed in her right upper extremity to continue her course of antibiotics until 11/27/2016. Patient is admitted back to skilled nursing facility for the continuation of cefazolin and for generalized weakness for OT/PT. While at skilled nursing facility patient will be followed for coronary artery disease treated with metoprolol and aspirin and statin, vitamin D deficiency treated with replacement and GERD treated with Nexium.  Past Medical History:  Diagnosis Date    . A-fib ([place])   . Acute deep vein thrombosis (DVT) of left upper extremity ([place]) 11/19/2016   unspecified vein, associated with PICC line  . Anginal pain ([place])   . Asthma    as a child  . BPPV (benign paroxysmal positional vertigo) 12/25/2015  . Brain tumor ([place]) 09/04/2016  . Coronary artery disease   . DDD (degenerative disc disease), lumbar   . Degenerative joint disease   . Dizziness and giddiness   . Dysrhythmia    A fib  . Episodic atrial fibrillation ([place])   . Esophageal reflux   . GERD (gastroesophageal reflux disease)   . Hiatal hernia   . History of kidney stones   . Hypertension, essential   . Kidney stones   . Liver cyst LEFT  . Myocardial infarction ([place])   . Nodule of parotid gland   . Paroxysmal atrial fibrillation ([place]) 09/01/2016   Chads2Vas score equals 3, but only 1 episodes in 2016, recent event recorder showing no evidence of atrial fibrillation.  . Pneumonia   . PONV (postoperative nausea and vomiting)   . Pulmonary nodule   . PVC (premature ventricular contraction)    Nov 2017 & Feb 2018 noted on cardiac monitor worn at home    Past Surgical History:  Procedure Laterality Date  . APPLICATION OF CRANIAL NAVIGATION N/A 09/04/2016   Procedure: APPLICATION OF CRANIAL NAVIGATION;  Surgeon: Consuella Lose, MD;  Location: [facility];  Service: Neurosurgery;  Laterality: N/A;  . BRAIN SURGERY  09/04/2016  . BREAST BIOPSY Bilateral   . CARPAL TUNNEL RELEASE Right 09/2013  . CATARACTS Bilateral   . CERVICAL SPINE SURGERY  04/2012  . FINGER SURGERY Right    TRIGGER FINGER RELEASE  . PARTIAL THYMECTOMY  1973   pt states doesn't remember this being done  . RETROSIGMOID CRANIECTOMY FOR TUMOR RESECTION Left 09/04/2016   Procedure: RETROSIGMOID CRANIECTOMY FOR TUMOR RESECTION WITH BRAINLAB NAVIGATION;  Surgeon: Consuella Lose, MD;  Location: [facility];  Service: Neurosurgery;  Laterality: Left;  CRANIOTOMY TUMOR EXCISION  . TONSILLECTOMY  1964  . VAGINAL HYSTERECTOMY   1974    Allergies as of 11/24/2016      Reactions   No Known Allergies Other (See Comments)      Medication List       Accurate as of 11/24/16  5:05 PM. Always use your most recent med list.          aspirin EC 81 MG tablet Take 81 mg by mouth daily.   atorvastatin 80 MG tablet Commonly known as:  LIPITOR Take 80 mg by mouth daily at 6 PM. Take one tablet daily   Carboxymethylcellulose Sod PF 0.5 % Soln Apply to eye. One drop in left eye 4 times daily. Do not use with 10 minutes of Vigamox   ceFAZolin IVPB Commonly known as:  ANCEF Inject into the vein. Infuse 50 mls (2 g total) into the vein every 8 hours. Last dose 11/27/16   esomeprazole 40 MG capsule Commonly known as:  NEXIUM Take 40 mg by mouth daily.   lidocaine 2 % solution Commonly known as:  XYLOCAINE Use as directed 20 mLs in the mouth or throat. Take 30 ml by mouth 4 times daily   metoprolol tartrate 25 MG tablet Commonly known as:  LOPRESSOR Take 1 tablet (25 mg total) by mouth 2 (two) times daily.   moxifloxacin 0.5 % ophthalmic solution Commonly known as:  VIGAMOX 1 drop. Place one drop in left eye 4 times daily   nitroGLYCERIN 0.4 MG SL tablet Commonly known as:  NITROSTAT Place 0.4 mg under the tongue every 5 (five) minutes as needed for chest pain.   ondansetron 8 MG tablet Commonly known as:  ZOFRAN Take 8 mg by mouth. Take one tablet every 8 hours as needed for nausea   Potassium Chloride ER 20 MEQ Tbcr Take by mouth. Take one tablet daily   Rivaroxaban 15 MG Tabs tablet Commonly known as:  XARELTO Take 15 mg by mouth. Take one tablet twice daily for 20 days   Vitamin B-12 5000 MCG Subl Take 2,500 mcg by mouth. Take one tablet daily   Vitamin D3 2000 units Tabs Take 2,000 Units by mouth daily.       Meds ordered this encounter  Medications  . Rivaroxaban (XARELTO) 15 MG TABS tablet    Sig: Take 15 mg by mouth. Take one tablet twice daily for 20 days  . atorvastatin (LIPITOR)  80 MG tablet    Sig: Take 80 mg by mouth daily at 6 PM. Take one tablet daily    Immunization History  Administered Date(s) Administered  . Influenza-Unspecified 10/31/2016  . PPD Test 11/06/2016  . Pneumococcal Conjugate-13 01/18/2009    Social History  Substance Use Topics  . Smoking status: Former Smoker    Types: Cigarettes    Quit date: 10/23/2005  . Smokeless tobacco: Never Used  . Alcohol use No    Family history is   Family History  Problem Relation Age of Onset  . Heart disease Mother 35  . Psoriasis Mother        PUSTULAR  . Hypertension Mother   .  Rheum arthritis Mother   . Heart attack Father 48  . Heart disease Father   . Lymphoma Sister   . Hypertension Sister   . Melanoma Brother   . Hypertension Brother   . Rheum arthritis Brother   . Hypertension Daughter   . Lymphoma Other   . Osteoarthritis Other   . Melanoma Other   . Hypertension Other       Review of Systems  DATA OBTAINED: from patient GENERAL:  no fevers, fatigue, appetite changes SKIN: No itching, or rash EYES: No eye pain, redness, discharge EARS: No earache, tinnitus, change in hearing NOSE: No congestion, drainage or bleeding  MOUTH/THROAT: No mouth or tooth pain, No sore throat RESPIRATORY: No cough, wheezing, SOB CARDIAC: No chest pain, palpitations, lower extremity edema  GI: No abdominal pain, No N/V/D or constipation, No heartburn or reflux  GU: No dysuria, frequency or urgency, or incontinence  MUSCULOSKELETAL: No unrelieved bone/joint pain NEUROLOGIC: No headache, dizziness or focal weakness PSYCHIATRIC: No c/o anxiety or sadness   Vitals:   11/24/16 1441  BP: 110/66  Pulse: 78  Resp: 20  Temp: 98 F (36.7 C)    SpO2 Readings from Last 1 Encounters:  11/16/16 96%   Body mass index is 30.54 kg/m.     Physical Exam  GENERAL APPEARANCE: Alert, conversant,  No acute distress.  SKIN: No diaphoresis rash HEAD: Normocephalic, atraumatic  EYES:  Conjunctiva/lids clear. Pupils round, reactive. EOMs intact.  EARS: External exam WNL, canals clear. Hearing grossly normal.  NOSE: No deformity or discharge.  MOUTH/THROAT: Lips w/o lesions  RESPIRATORY: Breathing is even, unlabored. Lung sounds are clear   CARDIOVASCULAR: Heart RRR no murmurs, rubs or gallops. No peripheral edema.   GASTROINTESTINAL: Abdomen is soft, non-tender, not distended w/ normal bowel sounds. GENITOURINARY: Bladder non tender, not distended  MUSCULOSKELETAL: No abnormal joints or musculature NEUROLOGIC:  Cranial nerves With cranial nerve V and cranial nerve VII palsy; Moves all extremities  PSYCHIATRIC: Mood and affect appropriate to situation, no behavioral issues  Patient Active Problem List   Diagnosis Date Noted  . Acute deep vein thrombosis (DVT) of left upper extremity ([place]) 11/19/2016  . Severe sepsis ([place]) 11/12/2016  . Bacteremia due to methicillin susceptible Staphylococcus aureus (MSSA) 11/12/2016  . Klebsiella cystitis 11/12/2016  . Pancytopenia ([place]) 11/12/2016  . Acute megaloblastic anemia due to severe illness 11/12/2016  . Bilateral lower extremity edema 11/12/2016  . Hyperlipidemia 11/12/2016  . CNS lymphoma ([place])   . Diffuse large B cell lymphoma ([place]) 09/16/2016  . Orthostatic hypotension   . Premature atrial complex   . Steroid-induced hyperglycemia   . Labile blood pressure   . Dysphagia   . PVC (premature ventricular contraction)   . Hypoalbuminemia due to protein-calorie malnutrition ([place])   . Leucocytosis   . Brain mass 09/09/2016  . Ataxia   . Benign essential HTN   . PAF (paroxysmal atrial fibrillation) ([place])   . Gastroesophageal reflux disease   . Diarrhea   . History of MI (myocardial infarction)   . Acute blood loss anemia   . Hypokalemia   . Post-operative pain   . Mass of brain 09/04/2016  . Brain tumor ([place]) 09/04/2016  . Coronary artery disease 09/01/2016  . Paroxysmal atrial fibrillation ([place]) 09/01/2016  .  Essential hypertension 09/01/2016  . BPPV (benign paroxysmal positional vertigo) 12/25/2015      Labs reviewed: Basic Metabolic Panel:    Component Value Date/Time   NA 140 11/11/2016   K 3.8  11/11/2016   CL 97 (L) 09/21/2016 1357   CO2 26 09/21/2016 1357   GLUCOSE 225 (H) 09/21/2016 1357   BUN 9 11/11/2016   CREATININE 0.4 (A) 11/11/2016   CREATININE 0.77 09/21/2016 1357   CALCIUM 9.0 09/21/2016 1357   PROT 4.9 (L) 09/10/2016 0518   PROT 6.4 07/22/2016 1247   ALBUMIN 3.0 (L) 09/10/2016 0518   ALBUMIN 4.5 07/22/2016 1247   AST 13 11/11/2016   ALT 6 (A) 11/11/2016   ALKPHOS 82 11/11/2016   BILITOT 0.8 09/10/2016 0518   BILITOT 0.7 07/22/2016 1247   GFRNONAA >60 09/21/2016 1357   GFRAA >60 09/21/2016 1357     Recent Labs  09/07/16 0342 09/08/16 0334  09/12/16 0623 09/16/16 0455 09/21/16 1357 10/28/16 11/06/16 11/11/16  NA 139 139  < > 135 135 132* 136* 140 140  K 2.8* 3.1*  < > 4.5 4.4 4.0 3.8 4.7 3.8  CL 101 102  < > 102 101 97*  --   --   --   CO2 30 28  < > 24 27 26   --   --   --   GLUCOSE 148* 134*  < > 126* 140* 225*  --   --   --   BUN 12 15  < > 21* 22* 29* 17 12 9   CREATININE 0.60 0.54  < > 0.58 0.68 0.77 0.6 0.3* 0.4*  CALCIUM 8.6* 8.6*  < > 8.8* 8.9 9.0  --   --   --   MG 1.9 2.0  --   --   --   --   --   --   --   PHOS 2.3*  --   --   --   --   --   --   --   --   < > = values in this interval not displayed. Liver Function Tests:  Recent Labs  07/22/16 1247 09/10/16 0518 10/28/16 11/11/16  AST 14 19 19 13   ALT 29 24 57* 6*  ALKPHOS 68 57 73 82  BILITOT 0.7 0.8  --   --   PROT 6.4 4.9*  --   --   ALBUMIN 4.5 3.0*  --   --    No results for input(s): LIPASE, AMYLASE in the last 8760 hours. No results for input(s): AMMONIA in the last 8760 hours. CBC:  Recent Labs  09/10/16 0518 09/12/16 0623 09/16/16 0455 09/21/16 1357 11/01/16 11/06/16 11/11/16  WBC 11.6* 15.8* 14.1* 14.1* 10.2 5.6 8.1  NEUTROABS 8.8* 12.3*  --  10.9*  --   --   --     HGB 11.2* 11.8* 11.9* 12.1 7.2* 7.9* 8.2*  HCT 32.6* 34.6* 33.9* 35.1* 20* 22* 25*  MCV 96.2 96.4 95.8 95.4  --   --   --   PLT 240 299 290 222 262 364 358   Lipid No results for input(s): CHOL, HDL, LDLCALC, TRIG in the last 8760 hours.  Cardiac Enzymes: No results for input(s): CKTOTAL, CKMB, CKMBINDEX, TROPONINI in the last 8760 hours. BNP: No results for input(s): BNP in the last 8760 hours. No results found for: MICROALBUR No results found for: HGBA1C No results found for: TSH No results found for: VITAMINB12 No results found for: FOLATE No results found for: IRON, TIBC, FERRITIN  Imaging and Procedures obtained prior to SNF admission: No results found.   Not all labs, radiology exams or other studies done during hospitalization come through on my EPIC note; however they are  reviewed by me.    Assessment and Plan  B CELL LYMPHOMA WITH CNS lymphoma/DVT LEFT UPPER extremity-patient currently on chemotherapy; patient had a recent port infection which was removed and replaced with a left sided PICC line; now patient has a left upper extremity DVT secondary to PICC line  and has had that line removed and now has a right PICC line; patient discharged on xarelo is anticoagulant SNF - Cirella to 50 mg twice a day until 10/13 which time patient will start Cirella to 20 mg daily  MSSA BACTEREMIA SNF - patient to receive cefazolin through her now a right PICC LINE until 11/27/2016  CAD SNF - stable; plan to continue ASA 81 mg by mouth daily, metoprolol 25 mg twice a day some little nitroglycerin when necessary and statin  VITAMIN D DEFICIENCY SNF - stable; continue replacement 2000 units daily  GERD SNF - stable; continue Nexium 40 mg by mouth daily  DVT LEFT EXTREMITY UPPER SNF - continue Xarelto 15 mg twice a day until 10/13 which time patient will transition to Xarelto 20 mg by mouth daily    Time spent greater than 40 minutes;> 50% of time with patient was spent  reviewing records, labs, tests and studies, counseling and developing plan of care  Webb Silversmith D. Sheppard Coil, MD

## 2016-11-26 ENCOUNTER — Encounter: Payer: Self-pay | Admitting: Internal Medicine

## 2016-11-26 DIAGNOSIS — H4922 Sixth [abducent] nerve palsy, left eye: Secondary | ICD-10-CM | POA: Insufficient documentation

## 2016-11-26 DIAGNOSIS — D6481 Anemia due to antineoplastic chemotherapy: Secondary | ICD-10-CM

## 2016-11-26 DIAGNOSIS — E559 Vitamin D deficiency, unspecified: Secondary | ICD-10-CM

## 2016-11-26 DIAGNOSIS — G510 Bell's palsy: Secondary | ICD-10-CM | POA: Insufficient documentation

## 2016-11-26 DIAGNOSIS — G519 Disorder of facial nerve, unspecified: Secondary | ICD-10-CM

## 2016-11-26 DIAGNOSIS — T451X5A Adverse effect of antineoplastic and immunosuppressive drugs, initial encounter: Secondary | ICD-10-CM

## 2016-11-26 HISTORY — DX: Vitamin D deficiency, unspecified: E55.9

## 2016-11-26 HISTORY — DX: Adverse effect of antineoplastic and immunosuppressive drugs, initial encounter: D64.81

## 2016-11-26 HISTORY — DX: Bell's palsy: G51.0

## 2016-11-26 HISTORY — DX: Adverse effect of antineoplastic and immunosuppressive drugs, initial encounter: T45.1X5A

## 2016-11-26 HISTORY — DX: Sixth (abducent) nerve palsy, left eye: H49.22

## 2016-11-30 ENCOUNTER — Encounter: Payer: Self-pay | Admitting: Internal Medicine

## 2016-11-30 ENCOUNTER — Non-Acute Institutional Stay (SKILLED_NURSING_FACILITY): Payer: Medicare Other | Admitting: Internal Medicine

## 2016-11-30 DIAGNOSIS — C8589 Other specified types of non-Hodgkin lymphoma, extranodal and solid organ sites: Secondary | ICD-10-CM

## 2016-11-30 DIAGNOSIS — H4922 Sixth [abducent] nerve palsy, left eye: Secondary | ICD-10-CM | POA: Diagnosis not present

## 2016-11-30 DIAGNOSIS — I10 Essential (primary) hypertension: Secondary | ICD-10-CM

## 2016-11-30 DIAGNOSIS — D6481 Anemia due to antineoplastic chemotherapy: Secondary | ICD-10-CM | POA: Diagnosis not present

## 2016-11-30 DIAGNOSIS — R7881 Bacteremia: Secondary | ICD-10-CM

## 2016-11-30 DIAGNOSIS — G519 Disorder of facial nerve, unspecified: Secondary | ICD-10-CM | POA: Diagnosis not present

## 2016-11-30 DIAGNOSIS — T451X5A Adverse effect of antineoplastic and immunosuppressive drugs, initial encounter: Secondary | ICD-10-CM | POA: Diagnosis not present

## 2016-11-30 DIAGNOSIS — S0502XS Injury of conjunctiva and corneal abrasion without foreign body, left eye, sequela: Secondary | ICD-10-CM | POA: Diagnosis not present

## 2016-11-30 DIAGNOSIS — H02204 Unspecified lagophthalmos left upper eyelid: Secondary | ICD-10-CM | POA: Diagnosis not present

## 2016-11-30 DIAGNOSIS — I82622 Acute embolism and thrombosis of deep veins of left upper extremity: Secondary | ICD-10-CM | POA: Diagnosis not present

## 2016-11-30 DIAGNOSIS — G510 Bell's palsy: Secondary | ICD-10-CM

## 2016-11-30 NOTE — Progress Notes (Signed)
: Provider:  Noah Vaughn. Kimberly Coil, MD Location:  [place] Room Number: 893 Place of Service:  SNF ([phone])  PCP: Ernestene Kiel, MD Patient Care Team: Ernestene Kiel, MD as PCP - General (Internal Medicine)  Extended Emergency Contact Information Primary Emergency Contact: [place] Phone: [phone] Relation: Daughter     Allergies: No known allergies  Chief Complaint  Patient presents with  . Readmit To SNF    following hospitalization 11/26/16 to 11/29/16 admitted for treatment with cycle 3 HD-MTX    HPI: Patient is 71 y.o. female with CNS lymphoma recent MSSA sepsis, hypertension, coronary artery disease, and atrial fib, who was admitted to [facility] from 9/27-30 for scheduled treatment with cycle 3 of HD-MTX. Patient tolerated treatment overall well but did require Diamox 3 times a day starting on the day of admission due to  fluid overload-this problem  had occurred last cycle. Patient had transient electrolyte abnormalities including hypomagnesemia and hypocalcemia which were treated per electrolyte protocol. Patient didn't require any transfusions during her admission. Patient is admitted back to skilled nursing facility with generalized weakness for OT PT and continue supportive care. While at skilled nursing facility patient will be followed for hyperlipidemia treated with Lipitor, hypertension treated with hydrochlorothiazide and metoprolol and Xarelto for left upper extremity DVT.  Past Medical History:  Diagnosis Date  . A-fib ([place])   . Acute deep vein thrombosis (DVT) of left upper extremity ([place]) 11/19/2016   unspecified vein, associated with PICC line  . Anginal pain ([place])   . Asthma    as a child  . BPPV (benign paroxysmal positional vertigo) 12/25/2015  . Brain tumor ([place]) 09/04/2016  . Coronary artery disease   . DDD (degenerative disc disease), lumbar   . Degenerative joint  disease   . Dizziness and giddiness   . Dysrhythmia    A fib  . Episodic atrial fibrillation ([place])   . Esophageal reflux   . GERD (gastroesophageal reflux disease)   . Hiatal hernia   . History of kidney stones   . Hypertension, essential   . Kidney stones   . Liver cyst LEFT  . Myocardial infarction ([place])   . Nodule of parotid gland   . Paroxysmal atrial fibrillation ([place]) 09/01/2016   Chads2Vas score equals 3, but only 1 episodes in 2016, recent event recorder showing no evidence of atrial fibrillation.  . Pneumonia   . PONV (postoperative nausea and vomiting)   . Pulmonary nodule   . PVC (premature ventricular contraction)    Nov 2017 & Feb 2018 noted on cardiac monitor worn at home    Past Surgical History:  Procedure Laterality Date  . APPLICATION OF CRANIAL NAVIGATION N/A 09/04/2016   Procedure: APPLICATION OF CRANIAL NAVIGATION;  Surgeon: Consuella Lose, MD;  Location: [place];  Service: Neurosurgery;  Laterality: N/A;  . BRAIN SURGERY  09/04/2016  . BREAST BIOPSY Bilateral   . CARPAL TUNNEL RELEASE Right 09/2013  . CATARACTS Bilateral   . CERVICAL SPINE SURGERY  04/2012  . FINGER SURGERY Right    TRIGGER FINGER RELEASE  . PARTIAL THYMECTOMY  1973   pt states doesn't remember this being done  . RETROSIGMOID CRANIECTOMY FOR TUMOR RESECTION Left 09/04/2016   Procedure: RETROSIGMOID CRANIECTOMY FOR TUMOR RESECTION WITH BRAINLAB NAVIGATION;  Surgeon: Consuella Lose, MD;  Location: [place];  Service: Neurosurgery;  Laterality: Left;  CRANIOTOMY TUMOR EXCISION  . TONSILLECTOMY  1964  . VAGINAL HYSTERECTOMY  1974    Allergies as of 11/30/2016      Reactions   No Known Allergies Other (See Comments)      Medication List       Accurate as of 11/30/16 10:18 AM. Always use your most recent med list.          aspirin EC 81 MG tablet Take 81 mg by mouth daily.   atorvastatin 80 MG tablet Commonly known as:  LIPITOR Take 80 mg by mouth daily at 6 PM. Take one tablet  daily   Carboxymethylcellulose Sod PF 0.5 % Soln Apply to eye. One drop in left eye 4 times daily. Do not use with 10 minutes of Vigamox   esomeprazole 40 MG capsule Commonly known as:  NEXIUM Take 40 mg by mouth daily.   leucovorin 25 MG tablet Commonly known as:  WELLCOVORIN Take by mouth every 6 (six) hours. Stop  12/01/16   metoprolol tartrate 25 MG tablet Commonly known as:  LOPRESSOR Take 1 tablet (25 mg total) by mouth 2 (two) times daily.   moxifloxacin 0.5 % ophthalmic solution Commonly known as:  VIGAMOX 1 drop. Place one drop in left eye 4 times daily   nitroGLYCERIN 0.4 MG SL tablet Commonly known as:  NITROSTAT Place 0.4 mg under the tongue every 5 (five) minutes as needed for chest pain.   ondansetron 8 MG tablet Commonly known as:  ZOFRAN Take 8 mg by mouth. Take one tablet every 8 hours as needed for nausea   Potassium Chloride ER 20 MEQ Tbcr Take by mouth. Take one tablet daily   Rivaroxaban 15 MG Tabs tablet Commonly known as:  XARELTO Take 15 mg by mouth. Take one tablet every evening with dinner   Vitamin B-12 5000 MCG Subl Take 2,500 mcg by mouth. Take one tablet daily   Vitamin D3 2000 units Tabs Take 2,000 Units by mouth daily.       Meds ordered this encounter  Medications  . leucovorin (WELLCOVORIN) 25 MG tablet    Sig: Take by mouth every 6 (six) hours. Stop  12/01/16    Immunization History  Administered Date(s) Administered  . Influenza-Unspecified 10/31/2016  . PPD Test 11/06/2016  . Pneumococcal Conjugate-13 01/18/2009    Social History  Substance Use Topics  . Smoking status: Former Smoker    Types: Cigarettes    Quit date: 10/23/2005  . Smokeless tobacco: Never Used  . Alcohol use No    Family history is   Family History  Problem Relation Age of Onset  . Heart disease Mother 53  . Psoriasis Mother        PUSTULAR  . Hypertension Mother   . Rheum arthritis Mother   . Heart attack Father 38  . Heart disease Father    . Lymphoma Sister   . Hypertension Sister   . Melanoma Brother   . Hypertension Brother   . Rheum arthritis Brother   . Hypertension Daughter   . Lymphoma Other   . Osteoarthritis Other   . Melanoma Other   . Hypertension Other       Review of Systems  DATA OBTAINED: from patient, nurse GENERAL:  no fevers, fatigue, appetite changes SKIN: No itching, or rash EYES: No eye pain, redness, discharge EARS: No earache, tinnitus, change in hearing NOSE: No congestion, drainage or bleeding  MOUTH/THROAT: No mouth or tooth pain, No sore throat RESPIRATORY: No cough, wheezing, SOB CARDIAC: No chest pain, palpitations, lower extremity edema  GI: No abdominal  pain, No N/V/D or constipation, No heartburn or reflux  GU: No dysuria, frequency or urgency, or incontinence  MUSCULOSKELETAL: No unrelieved bone/joint pain NEUROLOGIC: No headache, dizziness or focal weakness PSYCHIATRIC: No c/o anxiety or sadness   Vitals:   11/30/16 0947  BP: 130/71  Pulse: 96  Resp: 20  Temp: (!) 96.4 F (35.8 C)    SpO2 Readings from Last 1 Encounters:  11/16/16 96%   There is no height or weight on file to calculate BMI.     Physical Exam  GENERAL APPEARANCE: Alert, conversant,  No acute distress.  SKIN: No diaphoresis rash HEAD: Normocephalic, atraumatic  EYES: Conjunctiva/lids clear. Pupils round, reactive. EOMs intact.  EARS: External exam WNL, canals clear. Hearing grossly normal.  NOSE: No deformity or discharge.  MOUTH/THROAT: Lips w/o lesions  RESPIRATORY: Breathing is even, unlabored. Lung sounds are clear   CARDIOVASCULAR: Heart RRR no murmurs, rubs or gallops. No peripheral edema.   GASTROINTESTINAL: Abdomen is soft, non-tender, not distended w/ normal bowel sounds. GENITOURINARY: Bladder non tender, not distended  MUSCULOSKELETAL: No abnormal joints or musculature NEUROLOGIC:  Cranial nerves 2-12 grossly intact. Moves all extremities  PSYCHIATRIC: Mood and affect appropriate  to situation, no behavioral issues  Patient Active Problem List   Diagnosis Date Noted  . Cranial nerve VI palsy, left 11/26/2016  . Cranial nerve VII palsy 11/26/2016  . Anemia associated with chemotherapy 11/26/2016  . Vitamin D deficiency 11/26/2016  . Acute deep vein thrombosis (DVT) of left upper extremity ([place]) 11/19/2016  . Severe sepsis ([place]) 11/12/2016  . Bacteremia due to methicillin susceptible Staphylococcus aureus (MSSA) 11/12/2016  . Klebsiella cystitis 11/12/2016  . Pancytopenia ([place]) 11/12/2016  . Acute megaloblastic anemia due to severe illness 11/12/2016  . Bilateral lower extremity edema 11/12/2016  . Hyperlipidemia 11/12/2016  . CNS lymphoma ([place])   . Diffuse large B cell lymphoma ([place]) 09/16/2016  . Orthostatic hypotension   . Premature atrial complex   . Steroid-induced hyperglycemia   . Labile blood pressure   . Dysphagia   . PVC (premature ventricular contraction)   . Hypoalbuminemia due to protein-calorie malnutrition ([place])   . Leucocytosis   . Brain mass 09/09/2016  . Ataxia   . Benign essential HTN   . PAF (paroxysmal atrial fibrillation) ([place])   . Gastroesophageal reflux disease   . Diarrhea   . History of MI (myocardial infarction)   . Acute blood loss anemia   . Hypokalemia   . Post-operative pain   . Mass of brain 09/04/2016  . Brain tumor ([place]) 09/04/2016  . Coronary artery disease 09/01/2016  . Paroxysmal atrial fibrillation ([place]) 09/01/2016  . Essential hypertension 09/01/2016  . BPPV (benign paroxysmal positional vertigo) 12/25/2015      Labs reviewed: Basic Metabolic Panel:    Component Value Date/Time   NA 140 11/11/2016   K 3.8 11/11/2016   CL 97 (L) 09/21/2016 1357   CO2 26 09/21/2016 1357   GLUCOSE 225 (H) 09/21/2016 1357   BUN 9 11/11/2016   CREATININE 0.4 (A) 11/11/2016   CREATININE 0.77 09/21/2016 1357   CALCIUM 9.0 09/21/2016 1357   PROT 4.9 (L) 09/10/2016 0518   PROT 6.4 07/22/2016 1247   ALBUMIN 3.0 (L)  09/10/2016 0518   ALBUMIN 4.5 07/22/2016 1247   AST 13 11/11/2016   ALT 6 (A) 11/11/2016   ALKPHOS 82 11/11/2016   BILITOT 0.8 09/10/2016 0518   BILITOT 0.7 07/22/2016 1247   GFRNONAA >60 09/21/2016 1357   GFRAA >60 09/21/2016 1357  Recent Labs  09/07/16 0342 09/08/16 0334  09/12/16 0623 09/16/16 0455 09/21/16 1357 10/28/16 11/06/16 11/11/16  NA 139 139  < > 135 135 132* 136* 140 140  K 2.8* 3.1*  < > 4.5 4.4 4.0 3.8 4.7 3.8  CL 101 102  < > 102 101 97*  --   --   --   CO2 30 28  < > 24 27 26   --   --   --   GLUCOSE 148* 134*  < > 126* 140* 225*  --   --   --   BUN 12 15  < > 21* 22* 29* 17 12 9   CREATININE 0.60 0.54  < > 0.58 0.68 0.77 0.6 0.3* 0.4*  CALCIUM 8.6* 8.6*  < > 8.8* 8.9 9.0  --   --   --   MG 1.9 2.0  --   --   --   --   --   --   --   PHOS 2.3*  --   --   --   --   --   --   --   --   < > = values in this interval not displayed. Liver Function Tests:  Recent Labs  07/22/16 1247 09/10/16 0518 10/28/16 11/11/16  AST 14 19 19 13   ALT 29 24 57* 6*  ALKPHOS 68 57 73 82  BILITOT 0.7 0.8  --   --   PROT 6.4 4.9*  --   --   ALBUMIN 4.5 3.0*  --   --    No results for input(s): LIPASE, AMYLASE in the last 8760 hours. No results for input(s): AMMONIA in the last 8760 hours. CBC:  Recent Labs  09/10/16 0518 09/12/16 0623 09/16/16 0455 09/21/16 1357 11/01/16 11/06/16 11/11/16  WBC 11.6* 15.8* 14.1* 14.1* 10.2 5.6 8.1  NEUTROABS 8.8* 12.3*  --  10.9*  --   --   --   HGB 11.2* 11.8* 11.9* 12.1 7.2* 7.9* 8.2*  HCT 32.6* 34.6* 33.9* 35.1* 20* 22* 25*  MCV 96.2 96.4 95.8 95.4  --   --   --   PLT 240 299 290 222 262 364 358   Lipid No results for input(s): CHOL, HDL, LDLCALC, TRIG in the last 8760 hours.  Cardiac Enzymes: No results for input(s): CKTOTAL, CKMB, CKMBINDEX, TROPONINI in the last 8760 hours. BNP: No results for input(s): BNP in the last 8760 hours. No results found for: MICROALBUR No results found for: HGBA1C No results found for:  TSH No results found for: VITAMINB12 No results found for: FOLATE No results found for: IRON, TIBC, FERRITIN  Imaging and Procedures obtained prior to SNF admission: No results found.   Not all labs, radiology exams or other studies done during hospitalization come through on my EPIC note; however they are reviewed by me.    Assessment and Plan  PRIMARY CNS LYMPHOMA -patient received around 3 of HD-MTX which is right hallux and, sodium bicarbonate, and methotrexate all IV. We will continue her on 25 mg by mouth every 6 was written for patient to start 24 hours after methotrexate was finished. Patient had some problems with fluid over load on the last cycle so she was treated with Diamox 250 mg 3 times a day starting on the day of admission result. SNF - admitted for OT/PT, supportive care; plan to use leucovorin 25 mg by mouth every 6 starting 24 hours after the end of her methotrexate  CHEMOTHERAPY INDUCED ANEMIA-patient's transfusion  goals were to keep her hemoglobin greater than 7; she did not require any transfusions during her admission SNF - plan follow-up CBC  LEFT CN V and VII PALSY/LAGOPHTHALMOS/CORNEAL/ABRASION-patient is status post temporaryTARSORRHAPATHY on 10/17/2015 with corneal improvement. Patient is prominent hila closure due to risk for corneal desiccation and possible perforation. Surgery of her left upper lid [place] implant was planned but then canceled due to recent hospitalizations. snf - continue artificial tears and antibiotics eyedrops  ACUTE LUE DVT associated with PICC line-in hospital patient continued her home Xarelto 50 mg twice a day SNF - continue Xarelto 50 mg twice a day until 12/12/16 then start Xarelto 20 mg by mouth daily  HYPERTENSION-held chlorothiazide was held due to interaction with methotrexate; patient was treated with Diamox 250 mg 3 times a day to prevent fluid overload SNF- patient can return to her usual regimen of metoprolol 25 mg by  mouth twice a day  HISTORY MSSA BACTEREMIA-IV cefazolin discontinued on 9/28 8/18 was completed a four-week course   Time spent greater than 40 minutes;> 50% of time with patient was spent reviewing records, labs, tests and studies, counseling and developing plan of care  Kimberly Silversmith D. Kimberly Coil, MD

## 2016-12-03 ENCOUNTER — Encounter: Payer: Self-pay | Admitting: Internal Medicine

## 2016-12-03 DIAGNOSIS — S0502XS Injury of conjunctiva and corneal abrasion without foreign body, left eye, sequela: Secondary | ICD-10-CM | POA: Insufficient documentation

## 2016-12-03 DIAGNOSIS — H02204 Unspecified lagophthalmos left upper eyelid: Secondary | ICD-10-CM | POA: Insufficient documentation

## 2016-12-03 HISTORY — DX: Injury of conjunctiva and corneal abrasion without foreign body, left eye, sequela: S05.02XS

## 2016-12-03 HISTORY — DX: Unspecified lagophthalmos left upper eyelid: H02.204

## 2016-12-08 ENCOUNTER — Encounter: Payer: Self-pay | Admitting: Internal Medicine

## 2016-12-08 ENCOUNTER — Non-Acute Institutional Stay (SKILLED_NURSING_FACILITY): Payer: Medicare Other | Admitting: Internal Medicine

## 2016-12-08 DIAGNOSIS — R600 Localized edema: Secondary | ICD-10-CM

## 2016-12-08 DIAGNOSIS — D61818 Other pancytopenia: Secondary | ICD-10-CM

## 2016-12-08 DIAGNOSIS — A419 Sepsis, unspecified organism: Secondary | ICD-10-CM

## 2016-12-08 DIAGNOSIS — I10 Essential (primary) hypertension: Secondary | ICD-10-CM

## 2016-12-08 DIAGNOSIS — D531 Other megaloblastic anemias, not elsewhere classified: Secondary | ICD-10-CM

## 2016-12-08 DIAGNOSIS — N3090 Cystitis, unspecified without hematuria: Secondary | ICD-10-CM

## 2016-12-08 DIAGNOSIS — E785 Hyperlipidemia, unspecified: Secondary | ICD-10-CM | POA: Diagnosis not present

## 2016-12-08 DIAGNOSIS — K219 Gastro-esophageal reflux disease without esophagitis: Secondary | ICD-10-CM

## 2016-12-08 DIAGNOSIS — R6520 Severe sepsis without septic shock: Secondary | ICD-10-CM | POA: Diagnosis not present

## 2016-12-08 DIAGNOSIS — C8589 Other specified types of non-Hodgkin lymphoma, extranodal and solid organ sites: Secondary | ICD-10-CM | POA: Diagnosis not present

## 2016-12-08 DIAGNOSIS — R7881 Bacteremia: Secondary | ICD-10-CM

## 2016-12-08 DIAGNOSIS — B9689 Other specified bacterial agents as the cause of diseases classified elsewhere: Secondary | ICD-10-CM

## 2016-12-08 DIAGNOSIS — B961 Klebsiella pneumoniae [K. pneumoniae] as the cause of diseases classified elsewhere: Secondary | ICD-10-CM

## 2016-12-08 DIAGNOSIS — C8331 Diffuse large B-cell lymphoma, lymph nodes of head, face, and neck: Secondary | ICD-10-CM

## 2016-12-08 DIAGNOSIS — B9561 Methicillin susceptible Staphylococcus aureus infection as the cause of diseases classified elsewhere: Secondary | ICD-10-CM

## 2016-12-08 NOTE — Progress Notes (Signed)
Location:  [place] Room Number: [place]:  SNF [phone])  Provider: Noah Delaine. Sheppard Coil, MD  PCP: Ernestene Kiel, MD Patient Care Team: Ernestene Kiel, MD as PCP - General (Internal Medicine)  Extended Emergency Contact Information Primary Emergency Contact: [place] Phone: [phone] Relation: Daughter  Allergies  Allergen Reactions  . No Known Allergies Other (See Comments)    Chief Complaint  Patient presents with  . Discharge Note    discharge from SNF to home    HPI:  71 y.o. female  with history of CNS lymphoma status post resection 09/18/16, currently on chemotherapy, hypertension, coronary artery disease status post PCI on aspirin, paroxysmal atrial fib, who presented to [facility] emergency department shaking chills, nausea and decreased by mouth intake. Patient was admitted to [facility] from 8/25-9/7 for MSSA bacteremia, PA to by Klebsiella UTI. Patient was also found to be pancytopenic and did require 1 unit of packed RBCs for hemoglobin of 7.1 she was admitted to skilled nursing facility to continue Ancef until 11/27/16 and for OT/PT and patient is now ready to be discharged to home.    Past Medical History:  Diagnosis Date  . A-fib ([place])   . Acute deep vein thrombosis (DVT) of left upper extremity ([place]) 11/19/2016   unspecified vein, associated with PICC line  . Anginal pain ([place])   . Asthma    as a child  . BPPV (benign paroxysmal positional vertigo) 12/25/2015  . Brain tumor ([place]) 09/04/2016  . Coronary artery disease   . DDD (degenerative disc disease), lumbar   . Degenerative joint disease   . Dizziness and giddiness   . Dysrhythmia    A fib  . Episodic atrial fibrillation ([place])   . Esophageal reflux   . GERD (gastroesophageal reflux disease)   . Hiatal hernia   . History of kidney stones   . Hypertension, essential   . Kidney stones   . Liver  cyst LEFT  . Myocardial infarction ([place])   . Nodule of parotid gland   . Paroxysmal atrial fibrillation ([place]) 09/01/2016   Chads2Vas score equals 3, but only 1 episodes in 2016, recent event recorder showing no evidence of atrial fibrillation.  . Pneumonia   . PONV (postoperative nausea and vomiting)   . Pulmonary nodule   . PVC (premature ventricular contraction)    Nov 2017 & Feb 2018 noted on cardiac monitor worn at home    Past Surgical History:  Procedure Laterality Date  . APPLICATION OF CRANIAL NAVIGATION N/A 09/04/2016   Procedure: APPLICATION OF CRANIAL NAVIGATION;  Surgeon: Consuella Lose, MD;  Location: [facility];  Service: Neurosurgery;  Laterality: N/A;  . BRAIN SURGERY  09/04/2016  . BREAST BIOPSY Bilateral   . CARPAL TUNNEL RELEASE Right 09/2013  . CATARACTS Bilateral   . CERVICAL SPINE SURGERY  04/2012  . FINGER SURGERY Right    TRIGGER FINGER RELEASE  . PARTIAL THYMECTOMY  1973   pt states doesn't remember this being done  . RETROSIGMOID CRANIECTOMY FOR TUMOR RESECTION Left 09/04/2016   Procedure: RETROSIGMOID CRANIECTOMY FOR TUMOR RESECTION WITH BRAINLAB NAVIGATION;  Surgeon: Consuella Lose, MD;  Location: [place];  Service: Neurosurgery;  Laterality: Left;  CRANIOTOMY TUMOR EXCISION  . TONSILLECTOMY  1964  . VAGINAL HYSTERECTOMY  1974     reports that she quit smoking about 11 years ago. Her smoking use included Cigarettes. She has never used smokeless tobacco. She  reports that she does not drink alcohol or use drugs. Social History   Social History  . Marital status: Married    Spouse name: N/A  . Number of children: 1  . Years of education: 43   Occupational History  . Retired school    Social History Main Topics  . Smoking status: Former Smoker    Types: Cigarettes    Quit date: 10/23/2005  . Smokeless tobacco: Never Used  . Alcohol use No  . Drug use: No  . Sexual activity: Not Currently     Comment: MARRIED   Other Topics Concern  . Not on file    Social History Narrative   Lives with husband   Caffeine use: 2 cups coffee per day   Admitted to [facility]   Former smoker - stopped 2007   Alcohol none   DNR    Pertinent  Health Maintenance Due  Topic Date Due  . MAMMOGRAM  01/18/1994  . COLONOSCOPY  01/18/1994  . DEXA SCAN  01/18/2009  . PNA vac Low Risk Adult (2 of 2 - PPSV23) 01/18/2010  . INFLUENZA VACCINE  Completed    Medications: Allergies as of 12/08/2016      Reactions   No Known Allergies Other (See Comments)      Medication List       Accurate as of 12/08/16 12:45 PM. Always use your most recent med list.          aspirin EC 81 MG tablet Take 81 mg by mouth daily.   atorvastatin 80 MG tablet Commonly known as:  LIPITOR Take 80 mg by mouth daily at 6 PM. Take one tablet daily   Carboxymethylcellulose Sod PF 0.5 % Soln Apply to eye. One drop in left eye 4 times daily. Do not use with 10 minutes of Vigamox   esomeprazole 40 MG capsule Commonly known as:  NEXIUM Take 40 mg by mouth daily.   metoprolol tartrate 25 MG tablet Commonly known as:  LOPRESSOR Take 1 tablet (25 mg total) by mouth 2 (two) times daily.   moxifloxacin 0.5 % ophthalmic solution Commonly known as:  VIGAMOX 1 drop. Place one drop in left eye 4 times daily   nitroGLYCERIN 0.4 MG SL tablet Commonly known as:  NITROSTAT Place 0.4 mg under the tongue every 5 (five) minutes as needed for chest pain.   ondansetron 8 MG tablet Commonly known as:  ZOFRAN Take 8 mg by mouth. Take one tablet every 8 hours as needed for nausea   Potassium Chloride ER 20 MEQ Tbcr Take by mouth. Take one tablet twice  daily   Rivaroxaban 15 MG Tabs tablet Commonly known as:  XARELTO Take 15 mg by mouth. Take one tablet every evening with dinner   Vitamin B-12 5000 MCG Subl Take 2,500 mcg by mouth. Take one tablet daily   Vitamin D3 2000 units Tabs Take 2,000 Units by mouth daily.        Vitals:   12/08/16 0941  BP:  115/67  Pulse: 89  Resp: 18  Temp: 99.1 F (37.3 C)  Weight: 171 lb 3.2 oz (77.7 kg)  Height: 5\' 1"  (1.549 m)   Body mass index is 32.35 kg/m.  Physical Exam  GENERAL APPEARANCE: Alert, conversant. No acute distress.  HEENT: Unremarkable. RESPIRATORY: Breathing is even, unlabored. Lung sounds are clear   CARDIOVASCULAR: Heart RRR no murmurs, rubs or gallops. No peripheral edema.  GASTROINTESTINAL: Abdomen is soft, non-tender, not distended w/  normal bowel sounds.  NEUROLOGIC: Cranial nerves 2-12 grossly intact. Moves all extremities   Labs reviewed: Basic Metabolic Panel:  Recent Labs  09/07/16 0342 09/08/16 0334  09/12/16 0623 09/16/16 0455 09/21/16 1357 10/28/16 11/06/16 11/11/16  NA 139 139  < > 135 135 132* 136* 140 140  K 2.8* 3.1*  < > 4.5 4.4 4.0 3.8 4.7 3.8  CL 101 102  < > 102 101 97*  --   --   --   CO2 30 28  < > 24 27 26   --   --   --   GLUCOSE 148* 134*  < > 126* 140* 225*  --   --   --   BUN 12 15  < > 21* 22* 29* 17 12 9   CREATININE 0.60 0.54  < > 0.58 0.68 0.77 0.6 0.3* 0.4*  CALCIUM 8.6* 8.6*  < > 8.8* 8.9 9.0  --   --   --   MG 1.9 2.0  --   --   --   --   --   --   --   PHOS 2.3*  --   --   --   --   --   --   --   --   < > = values in this interval not displayed. No results found for: [facility] Liver Function Tests:  Recent Labs  07/22/16 1247 09/10/16 0518 10/28/16 11/11/16  AST 14 19 19 13   ALT 29 24 57* 6*  ALKPHOS 68 57 73 82  BILITOT 0.7 0.8  --   --   PROT 6.4 4.9*  --   --   ALBUMIN 4.5 3.0*  --   --    No results for input(s): LIPASE, AMYLASE in the last 8760 hours. No results for input(s): AMMONIA in the last 8760 hours. CBC:  Recent Labs  09/10/16 0518 09/12/16 0623 09/16/16 0455 09/21/16 1357 11/01/16 11/06/16 11/11/16  WBC 11.6* 15.8* 14.1* 14.1* 10.2 5.6 8.1  NEUTROABS 8.8* 12.3*  --  10.9*  --   --   --   HGB 11.2* 11.8* 11.9* 12.1 7.2* 7.9* 8.2*  HCT 32.6* 34.6* 33.9* 35.1* 20* 22* 25*  MCV 96.2 96.4 95.8 95.4   --   --   --   PLT 240 299 290 222 262 364 358   Lipid No results for input(s): CHOL, HDL, LDLCALC, TRIG in the last 8760 hours. Cardiac Enzymes: No results for input(s): CKTOTAL, CKMB, CKMBINDEX, TROPONINI in the last 8760 hours. BNP: No results for input(s): BNP in the last 8760 hours. CBG: No results for input(s): GLUCAP in the last 8760 hours.  Procedures and Imaging Studies During Stay: No results found.  Assessment/Plan:   Bacteremia due to methicillin susceptible Staphylococcus aureus (MSSA)  Severe sepsis (HCC)  Klebsiella cystitis  Pancytopenia (HCC)  Acute megaloblastic anemia due to severe illness  Diffuse large B-cell lymphoma of lymph nodes of head (HCC)  CNS lymphoma (HCC)  Bilateral lower extremity edema  Hyperlipidemia, unspecified hyperlipidemia type  Gastroesophageal reflux disease without esophagitis  Essential hypertension   Patient is being discharged with the following home health services:  PT/OT/nursing  Patient is being discharged with the following durable medical equipment:  None  Patient has been advised to f/u with their PCP in 1-2 weeks to bring them up to date on their rehab stay.  Social services at facility was responsible for arranging this appointment.  Pt was provided with a 30 day supply of  prescriptions for medications and refills must be obtained from their PCP.  For controlled substances, a more limited supply may be provided adequate until PCP appointment only.  Medications have been reconciled.  Time spent greater than 30 minutes;> 50% of time with patient was spent reviewing records, labs, tests and studies, counseling and developing plan of care  Noah Delaine. Sheppard Coil, MD

## 2016-12-30 ENCOUNTER — Ambulatory Visit (INDEPENDENT_AMBULATORY_CARE_PROVIDER_SITE_OTHER): Payer: Medicare Other | Admitting: Cardiology

## 2016-12-30 ENCOUNTER — Encounter: Payer: Self-pay | Admitting: Cardiology

## 2016-12-30 VITALS — BP 124/60 | HR 64 | Resp 14 | Ht 61.0 in | Wt 161.1 lb

## 2016-12-30 DIAGNOSIS — I10 Essential (primary) hypertension: Secondary | ICD-10-CM

## 2016-12-30 DIAGNOSIS — I480 Paroxysmal atrial fibrillation: Secondary | ICD-10-CM

## 2016-12-30 DIAGNOSIS — C8589 Other specified types of non-Hodgkin lymphoma, extranodal and solid organ sites: Secondary | ICD-10-CM

## 2016-12-30 DIAGNOSIS — I25118 Atherosclerotic heart disease of native coronary artery with other forms of angina pectoris: Secondary | ICD-10-CM | POA: Diagnosis not present

## 2016-12-30 MED ORDER — NITROGLYCERIN 0.4 MG SL SUBL: 0.4000 mg | SUBLINGUAL_TABLET | SUBLINGUAL | 2 refills | Status: DC | PRN

## 2016-12-30 MED ORDER — METOPROLOL TARTRATE 50 MG PO TABS: 50.0000 mg | ORAL_TABLET | Freq: Two times a day (BID) | ORAL | 11 refills | Status: DC

## 2016-12-30 NOTE — Progress Notes (Signed)
Cardiology Office Note:    Date:  12/30/2016   ID:  Kimberly Vaughn [place], DOB 1946/01/10, MRN [id number]  PCP:  Ernestene Kiel, MD  Cardiologist:  Jenne Campus, MD    Referring MD: Ernestene Kiel, MD   Chief Complaint  Patient presents with  . Follow-up  I am having chest pain  History of Present Illness:    Kimberly Vaughn is a 71 y.o. female with coronary artery disease status post PTCA and stenting done last year.  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  She is doing quite well from that point of view, however a month or 2 she started experiencing some chest pain that can happen at different time.  She also described to have some fast heartbeats.  Her primary care physician for appropriate increase dose of beta-blocker which helped tremendously.  Since that time there is no more palpitations no more fast heart beating only one episode of chest pain that went away by itself.  She did not have a chance to try nitroglycerin.  Past Medical History:  Diagnosis Date  . A-fib ([place])   . Acute deep vein thrombosis (DVT) of left upper extremity ([place]) 11/19/2016   unspecified vein, associated with PICC line  . Anginal pain ([place])   . Asthma    as a child  . BPPV (benign paroxysmal positional vertigo) 12/25/2015  . Brain tumor ([place]) 09/04/2016  . Coronary artery disease   . DDD (degenerative disc disease), lumbar   . Degenerative joint disease   . Dizziness and giddiness   . Dysrhythmia    A fib  . Episodic atrial fibrillation ([place])   . Esophageal reflux   . GERD (gastroesophageal reflux disease)   . Hiatal hernia   . History of kidney stones   . Hypertension, essential   . Kidney stones   . Liver cyst LEFT  . Myocardial infarction ([place])   . Nodule of parotid gland   . Non-Hodgkin lymphoma ([place])    Brain  . Paroxysmal atrial fibrillation ([place]) 09/01/2016   Chads2Vas score equals 3, but only 1 episodes in 2016,  recent event recorder showing no evidence of atrial fibrillation.  . Pneumonia   . PONV (postoperative nausea and vomiting)   . Pulmonary nodule   . PVC (premature ventricular contraction)    Nov 2017 & Feb 2018 noted on cardiac monitor worn at home    Past Surgical History:  Procedure Laterality Date  . APPLICATION OF CRANIAL NAVIGATION N/A 09/04/2016   Procedure: APPLICATION OF CRANIAL NAVIGATION;  Surgeon: Consuella Lose, MD;  Location: [facility];  Service: Neurosurgery;  Laterality: N/A;  . BRAIN SURGERY  09/04/2016  . BREAST BIOPSY Bilateral   . CARPAL TUNNEL RELEASE Right 09/2013  . CATARACTS Bilateral   . CERVICAL SPINE SURGERY  04/2012  . FINGER SURGERY Right    TRIGGER FINGER RELEASE  . PARTIAL THYMECTOMY  1973   pt states doesn't remember this being done  . RETROSIGMOID CRANIECTOMY FOR TUMOR RESECTION Left 09/04/2016   Procedure: RETROSIGMOID CRANIECTOMY FOR TUMOR RESECTION WITH BRAINLAB NAVIGATION;  Surgeon: Consuella Lose, MD;  Location: [facility];  Service: Neurosurgery;  Laterality: Left;  CRANIOTOMY TUMOR EXCISION  . TONSILLECTOMY  1964  . VAGINAL HYSTERECTOMY  1974    Current Medications: Current Meds  Medication Sig  . aspirin EC 81 MG tablet Take 81 mg by mouth daily.  Marland Kitchen atorvastatin (LIPITOR) 80 MG tablet Take 80 mg  by mouth daily at 6 PM. Take one tablet daily  . Carboxymethylcellulose Sod PF 0.5 % SOLN Apply to eye. One drop in left eye 4 times daily. Do not use with 10 minutes of Vigamox  . Cholecalciferol (VITAMIN D3) 2000 units TABS Take 2,000 Units by mouth daily.  . Cyanocobalamin (VITAMIN B-12) 5000 MCG SUBL Take 2,500 mcg by mouth. Take one tablet daily  . esomeprazole (NEXIUM) 40 MG capsule Take 40 mg by mouth daily.  . metoprolol tartrate (LOPRESSOR) 25 MG tablet Take 1 tablet (25 mg total) by mouth 2 (two) times daily. (Patient taking differently: Take 37.5 mg by mouth 2 (two) times daily. )  . moxifloxacin (VIGAMOX) 0.5 % ophthalmic solution 1 drop.  Place one drop in left eye 4 times daily  . nitroGLYCERIN (NITROSTAT) 0.4 MG SL tablet Place 0.4 mg under the tongue every 5 (five) minutes as needed for chest pain.  Marland Kitchen ondansetron (ZOFRAN) 8 MG tablet Take 8 mg by mouth. Take one tablet every 8 hours as needed for nausea  . Potassium Chloride ER 20 MEQ TBCR Take by mouth. Take one tablet twice  daily  . rivaroxaban (XARELTO) 20 MG TABS tablet Take 20 mg by mouth daily with supper. 2*12/12/16     Allergies:   No known allergies   Social History   Social History  . Marital status: Married    Spouse name: N/A  . Number of children: 1  . Years of education: 71   Occupational History  . Retired school    Social History Main Topics  . Smoking status: Former Smoker    Types: Cigarettes    Quit date: 10/23/2005  . Smokeless tobacco: Never Used  . Alcohol use No  . Drug use: No  . Sexual activity: Not Currently     Comment: MARRIED   Other Topics Concern  . None   Social History Narrative   Lives with husband   Caffeine use: 2 cups coffee per day   Admitted to [facility]   Former smoker - stopped 2007   Alcohol none   DNR     Family History: The patient's family history includes Heart attack (age of onset: 49) in her father; Heart disease in her father; Heart disease (age of onset: 63) in her mother; Hypertension in her brother, daughter, mother, other, and sister; Lymphoma in her other and sister; Melanoma in her brother and other; Osteoarthritis in her other; Psoriasis in her mother; Rheum arthritis in her brother and mother. ROS:   Please see the history of present illness.    All 14 point review of systems negative except as described per history of present illness  EKGs/Labs/Other Studies Reviewed:      Recent Labs: 09/08/2016: Magnesium 2.0 11/11/2016: ALT 6; BUN 9; Creatinine 0.4; Hemoglobin 8.2; Platelets 358; Potassium 3.8; Sodium 140  Recent Lipid Panel No results found for: CHOL, TRIG, HDL,  CHOLHDL, VLDL, LDLCALC, LDLDIRECT  Physical Exam:    VS:  BP 124/60   Pulse 64   Resp 14   Ht 5\' 1"  (1.549 m)   Wt 161 lb 1.9 oz (73.1 kg)   LMP  (LMP Unknown)   BMI 30.44 kg/m     Wt Readings from Last 3 Encounters:  12/30/16 161 lb 1.9 oz (73.1 kg)  12/08/16 171 lb 3.2 oz (77.7 kg)  11/24/16 167 lb (75.8 kg)     GEN:  Well nourished, well developed in no acute distress HEENT: Normal  NECK: No JVD; No carotid bruits LYMPHATICS: No lymphadenopathy CARDIAC: RRR, no murmurs, no rubs, no gallops RESPIRATORY:  Clear to auscultation without rales, wheezing or rhonchi  ABDOMEN: Soft, non-tender, non-distended MUSCULOSKELETAL:  No edema; No deformity  SKIN: Warm and dry LOWER EXTREMITIES: no swelling NEUROLOGIC:  Alert and oriented x 3 PSYCHIATRIC:  Normal affect   ASSESSMENT:    1. PAF (paroxysmal atrial fibrillation) ([place])   2. Benign essential HTN   3. Coronary artery disease of native artery of native heart with stable angina pectoris ([place])   4. CNS lymphoma ([place])    PLAN:    In order of problems listed above:  1. Paroxysmal atrial fibrillation: Denies having any.  She did have some palpitation which is difficult to judge if this is atrial fibrillation or some other arrhythmia.  Now after increasing dose of beta-blocker she is stable without palpitations I will do EKG today to check the rhythm as well as check if there is enough room to increase dose of beta-blocker again.  She is being anticoagulated however anticoagulation is for her DVT in the arm.  She tells me that she had multiple echoes done at [facility] we will try to retrieve latest echocardiogram report. 2. Angina pectoris: Increased dose of beta-blocker seems to be helping I gave her new prescription for nitroglycerin because of her comorbidities I have a conservative approach. 3. Benign essential hypertension: Blood pressure well controlled.  Lady with coronary artery disease drug-eluting stent 2 months ago  however dual antiplatelet agent therapy had to be interrupted because of CNS lymphoma that required chemotherapy.  Now she is back on aspirin as well as anticoagulation because of DVT but still having some symptoms that are concerning because of comorbidity conservative approach is recommended at least for now   Medication Adjustments/Labs and Tests Ordered: Current medicines are reviewed at length with the patient today.  Concerns regarding medicines are outlined above.  No orders of the defined types were placed in this encounter.  Medication changes: No orders of the defined types were placed in this encounter.   Signed, Park Liter, MD, [facility] 12/30/2016 3:18 PM    [place]

## 2016-12-30 NOTE — Patient Instructions (Addendum)
Medication Instructions:  Your physician has recommended you make the following change in your medication:   1.) INCREASE Metoprolol to 50 mg twice daily. A new prescription has been sent to the pharmacy.    1. Avoid all over-the-counter antihistamines except Claritin/Loratadine and Zyrtec/Cetrizine. 2. Avoid all combination including cold sinus allergies flu decongestant and sleep medications 3. You can use Robitussin DM Mucinex and Mucinex DM for cough. 4. can use Tylenol aspirin ibuprofen and naproxen but no combinations such as sleep or sinus.  The proper use and anticipated side effects of nitroglycerine has been carefully explained.  If a single episode of chest pain is not relieved by one tablet, the patient will try another within 5 minutes; and if this doesn't relieve the pain, the patient is instructed to call 911 for transportation to an emergency department.  Labwork: None   Testing/Procedures: None   Follow-Up: Your physician recommends that you schedule a follow-up appointment in: 1 month   Any Other Special Instructions Will Be Listed Below (If Applicable).  Please note that any paperwork needing to be filled out by the provider will need to be addressed at the front desk prior to seeing the provider. Please note that any paperwork FMLA, Disability or other documents regarding health condition is subject to a $25.00 charge that must be received prior to completion of paperwork in the form of a money order or check.    If you need a refill on your cardiac medications before your next appointment, please call your pharmacy.

## 2017-01-05 DIAGNOSIS — H6123 Impacted cerumen, bilateral: Secondary | ICD-10-CM

## 2017-01-05 HISTORY — DX: Impacted cerumen, bilateral: H61.23

## 2017-01-07 DIAGNOSIS — Z955 Presence of coronary angioplasty implant and graft: Secondary | ICD-10-CM | POA: Insufficient documentation

## 2017-01-07 DIAGNOSIS — I459 Conduction disorder, unspecified: Secondary | ICD-10-CM

## 2017-01-07 HISTORY — DX: Presence of coronary angioplasty implant and graft: Z95.5

## 2017-01-07 HISTORY — DX: Conduction disorder, unspecified: I45.9

## 2017-01-29 ENCOUNTER — Ambulatory Visit: Payer: Medicare Other | Admitting: Cardiology

## 2017-02-01 ENCOUNTER — Ambulatory Visit: Payer: Medicare Other | Admitting: Cardiology

## 2017-02-01 ENCOUNTER — Encounter: Payer: Self-pay | Admitting: Cardiology

## 2017-02-01 VITALS — BP 120/60 | HR 75 | Ht 61.0 in | Wt 157.4 lb

## 2017-02-01 DIAGNOSIS — I25118 Atherosclerotic heart disease of native coronary artery with other forms of angina pectoris: Secondary | ICD-10-CM

## 2017-02-01 DIAGNOSIS — C8589 Other specified types of non-Hodgkin lymphoma, extranodal and solid organ sites: Secondary | ICD-10-CM

## 2017-02-01 DIAGNOSIS — I493 Ventricular premature depolarization: Secondary | ICD-10-CM | POA: Diagnosis not present

## 2017-02-01 DIAGNOSIS — I10 Essential (primary) hypertension: Secondary | ICD-10-CM

## 2017-02-01 DIAGNOSIS — I480 Paroxysmal atrial fibrillation: Secondary | ICD-10-CM | POA: Diagnosis not present

## 2017-02-01 NOTE — Patient Instructions (Addendum)
Medication Instructions:  Your physician recommends that you continue on your current medications as directed. Please refer to the Current Medication list given to you today.   Labwork: None  Testing/Procedures: None  Follow-Up: Your physician recommends that you schedule a follow-up appointment in: 3 months  Any Other Special Instructions Will Be Listed Below (If Applicable).     If you need a refill on your cardiac medications before your next appointment, please call your pharmacy.   

## 2017-02-01 NOTE — Progress Notes (Signed)
Cardiology Office Note:    Date:  02/01/2017   ID:  Kimberly Vaughn, DOB 1945/08/07, MRN [id number]  PCP:  Ernestene Kiel, MD  Cardiologist:  Jenne Campus, MD    Referring MD: Ernestene Kiel, MD   Chief Complaint  Patient presents with  . 1 month follow up  Doing well cardiac wise  History of Present Illness:    Kimberly Vaughn is a 71 y.o. female with quite complex past medical history.  She did have some chest pain as well as palpitations however her beta-blocker has been increased and since that time she is doing very well.  Denies having any palpitation no tightness squeezing pressure burning chest.  She was find out to have brain lymphoma that being aggressively managed by oncology team.  She seems to be doing well from that point of view.  And apparently tumor shrunk significantly.  Past Medical History:  Diagnosis Date  . A-fib ([place])   . Acute deep vein thrombosis (DVT) of left upper extremity ([place]) 11/19/2016   unspecified vein, associated with PICC line  . Anginal pain ([place])   . Asthma    as a child  . BPPV (benign paroxysmal positional vertigo) 12/25/2015  . Brain tumor ([place]) 09/04/2016  . Coronary artery disease   . DDD (degenerative disc disease), lumbar   . Degenerative joint disease   . Dizziness and giddiness   . Dysrhythmia    A fib  . Episodic atrial fibrillation ([place])   . Esophageal reflux   . GERD (gastroesophageal reflux disease)   . Hiatal hernia   . History of kidney stones   . Hypertension, essential   . Kidney stones   . Liver cyst LEFT  . Myocardial infarction ([place])   . Nodule of parotid gland   . Non-Hodgkin lymphoma ([place])    Brain  . Paroxysmal atrial fibrillation ([place]) 09/01/2016   Chads2Vas score equals 3, but only 1 episodes in 2016, recent event recorder showing no evidence of atrial fibrillation.  . Pneumonia   . PONV (postoperative nausea and vomiting)   . Pulmonary nodule   . PVC (premature ventricular contraction)    Nov  2017 & Feb 2018 noted on cardiac monitor worn at home    Past Surgical History:  Procedure Laterality Date  . APPLICATION OF CRANIAL NAVIGATION N/A 09/04/2016   Procedure: APPLICATION OF CRANIAL NAVIGATION;  Surgeon: Consuella Lose, MD;  Location: [facility];  Service: Neurosurgery;  Laterality: N/A;  . BRAIN SURGERY  09/04/2016  . BREAST BIOPSY Bilateral   . CARPAL TUNNEL RELEASE Right 09/2013  . CATARACTS Bilateral   . CERVICAL SPINE SURGERY  04/2012  . FINGER SURGERY Right    TRIGGER FINGER RELEASE  . PARTIAL THYMECTOMY  1973   pt states doesn't remember this being done  . RETROSIGMOID CRANIECTOMY FOR TUMOR RESECTION Left 09/04/2016   Procedure: RETROSIGMOID CRANIECTOMY FOR TUMOR RESECTION WITH BRAINLAB NAVIGATION;  Surgeon: Consuella Lose, MD;  Location: [facility];  Service: Neurosurgery;  Laterality: Left;  CRANIOTOMY TUMOR EXCISION  . TONSILLECTOMY  1964  . VAGINAL HYSTERECTOMY  1974    Current Medications: Current Meds  Medication Sig  . aspirin EC 81 MG tablet Take 81 mg by mouth daily.  Marland Kitchen atorvastatin (LIPITOR) 80 MG tablet Take 80 mg by mouth daily at 6 PM. Take one tablet daily  . Carboxymethylcellulose Sod PF 0.5 % SOLN Apply to eye. One drop in left eye 4 times daily. Do not use with 10 minutes of Vigamox  .  Cholecalciferol (VITAMIN D3) 2000 units TABS Take 2,000 Units by mouth daily.  . Cyanocobalamin (VITAMIN B-12) 5000 MCG SUBL Take 2,500 mcg by mouth. Take one tablet daily  . esomeprazole (NEXIUM) 40 MG capsule Take 40 mg by mouth daily.  . metoprolol tartrate (LOPRESSOR) 50 MG tablet Take 1 tablet (50 mg total) by mouth 2 (two) times daily.  . nitroGLYCERIN (NITROSTAT) 0.4 MG SL tablet Place 1 tablet (0.4 mg total) under the tongue every 5 (five) minutes as needed for chest pain.  Marland Kitchen ondansetron (ZOFRAN) 8 MG tablet Take 8 mg by mouth. Take one tablet every 8 hours as needed for nausea  . Potassium Chloride ER 20 MEQ TBCR Take by mouth. Take one tablet twice  daily  .  rivaroxaban (XARELTO) 20 MG TABS tablet Take 20 mg by mouth daily with supper. 2*12/12/16     Allergies:   No known allergies   Social History   Socioeconomic History  . Marital status: Married    Spouse name: None  . Number of children: 1  . Years of education: 39  . Highest education level: None  Social Needs  . Financial resource strain: None  . Food insecurity - worry: None  . Food insecurity - inability: None  . Transportation needs - medical: None  . Transportation needs - non-medical: None  Occupational History  . Occupation: Retired school  Tobacco Use  . Smoking status: Former Smoker    Types: Cigarettes    Last attempt to quit: 10/23/2005    Years since quitting: 11.2  . Smokeless tobacco: Never Used  Substance and Sexual Activity  . Alcohol use: No  . Drug use: No  . Sexual activity: Not Currently    Comment: MARRIED  Other Topics Concern  . None  Social History Narrative   Lives with husband   Caffeine use: 2 cups coffee per day   Admitted to [place]   Former smoker - stopped 2007   Alcohol none   DNR     Family History: The patient's family history includes Heart attack (age of onset: 33) in her father; Heart disease in her father; Heart disease (age of onset: 27) in her mother; Hypertension in her brother, daughter, mother, other, and sister; Lymphoma in her other and sister; Melanoma in her brother and other; Osteoarthritis in her other; Psoriasis in her mother; Rheum arthritis in her brother and mother. ROS:   Please see the history of present illness.    All 14 point review of systems negative except as described per history of present illness  EKGs/Labs/Other Studies Reviewed:      Recent Labs: 09/08/2016: Magnesium 2.0 11/11/2016: ALT 6; BUN 9; Creatinine 0.4; Hemoglobin 8.2; Platelets 358; Potassium 3.8; Sodium 140  Recent Lipid Panel No results found for: CHOL, TRIG, HDL, CHOLHDL, VLDL, LDLCALC, LDLDIRECT  Physical Exam:      VS:  BP 120/60   Pulse 75   Ht 5\' 1"  (1.549 m)   Wt 157 lb 6.4 oz (71.4 kg)   LMP  (LMP Unknown)   SpO2 98%   BMI 29.74 kg/m     Wt Readings from Last 3 Encounters:  02/01/17 157 lb 6.4 oz (71.4 kg)  12/30/16 161 lb 1.9 oz (73.1 kg)  12/08/16 171 lb 3.2 oz (77.7 kg)     GEN:  Well nourished, well developed in no acute distress HEENT: Normal NECK: No JVD; No carotid bruits LYMPHATICS: No lymphadenopathy CARDIAC: RRR, no murmurs, no rubs,  no gallops RESPIRATORY:  Clear to auscultation without rales, wheezing or rhonchi  ABDOMEN: Soft, non-tender, non-distended MUSCULOSKELETAL:  No edema; No deformity  SKIN: Warm and dry LOWER EXTREMITIES: no swelling NEUROLOGIC:  Alert and oriented x 3 PSYCHIATRIC:  Normal affect   ASSESSMENT:    1. Benign essential HTN   2. Coronary artery disease of native artery of native heart with stable angina pectoris ([place])   3. PAF (paroxysmal atrial fibrillation) ([place])   4. PVC (premature ventricular contraction)   5. CNS lymphoma ([place])    PLAN:    In order of problems listed above:  1. Benign essential hypertension: Blood pressure appears to be well controlled we will continue present management. 2. Going artery disease: Doing well asymptomatic we will continue present management. 3. Paroxysmal atrial fibrillation: Denies having any palpitations.  Heart rate is regular.  She is being anticoagulated because of arm DVT at the same time would protect her from having stroke.  Her chads 2 Vascor equals 3 4. CNS lymphoma: Followed by oncology team   Medication Adjustments/Labs and Tests Ordered: Current medicines are reviewed at length with the patient today.  Concerns regarding medicines are outlined above.  No orders of the defined types were placed in this encounter.  Medication changes: No orders of the defined types were placed in this encounter.   Signed, Park Liter, MD, [facility] 02/01/2017 10:09 AM    [place]

## 2017-03-16 DIAGNOSIS — R270 Ataxia, unspecified: Secondary | ICD-10-CM

## 2017-03-16 DIAGNOSIS — I951 Orthostatic hypotension: Secondary | ICD-10-CM

## 2017-03-16 DIAGNOSIS — R197 Diarrhea, unspecified: Secondary | ICD-10-CM

## 2017-03-16 DIAGNOSIS — H02234 Paralytic lagophthalmos left upper eyelid: Secondary | ICD-10-CM

## 2017-03-16 DIAGNOSIS — D62 Acute posthemorrhagic anemia: Secondary | ICD-10-CM

## 2017-03-16 HISTORY — DX: Diarrhea, unspecified: R19.7

## 2017-03-16 HISTORY — DX: Ataxia, unspecified: R27.0

## 2017-03-16 HISTORY — DX: Orthostatic hypotension: I95.1

## 2017-03-16 HISTORY — DX: Paralytic lagophthalmos left upper eyelid: H02.234

## 2017-03-16 HISTORY — DX: Acute posthemorrhagic anemia: D62

## 2017-04-22 DIAGNOSIS — Z5111 Encounter for antineoplastic chemotherapy: Secondary | ICD-10-CM

## 2017-04-22 HISTORY — DX: Encounter for antineoplastic chemotherapy: Z51.11

## 2017-05-10 ENCOUNTER — Ambulatory Visit: Payer: Medicare Other | Admitting: Cardiology

## 2017-05-19 ENCOUNTER — Ambulatory Visit: Payer: Medicare Other | Admitting: Cardiology

## 2017-05-19 ENCOUNTER — Encounter: Payer: Self-pay | Admitting: Cardiology

## 2017-05-19 VITALS — BP 120/70 | HR 68 | Ht 61.0 in | Wt 148.1 lb

## 2017-05-19 DIAGNOSIS — I10 Essential (primary) hypertension: Secondary | ICD-10-CM | POA: Diagnosis not present

## 2017-05-19 DIAGNOSIS — I25118 Atherosclerotic heart disease of native coronary artery with other forms of angina pectoris: Secondary | ICD-10-CM

## 2017-05-19 DIAGNOSIS — I480 Paroxysmal atrial fibrillation: Secondary | ICD-10-CM | POA: Diagnosis not present

## 2017-05-19 NOTE — Progress Notes (Signed)
Cardiology Office Note:    Date:  05/19/2017   ID:  Kimberly Vaughn [place], DOB 1945-11-02, MRN [id number]  PCP:  Kimberly Kiel, MD  Cardiologist:  Kimberly Campus, MD    Referring MD: Kimberly Kiel, MD   Chief Complaint  Patient presents with  . Follow-up  Doing well  History of Present Illness:    Kimberly Vaughn is a 72 y.o. female with paroxysmal atrial fibrillation, remote history of coronary artery disease, brain lymphoma on aggressive chemotherapy doing well from that point of view.  Described to have rare palpitations lasting only for a few minutes.  From what I understand aggressiveness of her chemotherapy will be gradually lowered.  I will not investigate her palpitations right now until her chemotherapy will be finished.  If after chemotherapy is finished she still gets palpitations she will required event recorder.  Denies have any cardiac complaints she described to have a little bit more energy and being able to do more.  No chest pain tightness squeezing pressure burning chest  Past Medical History:  Diagnosis Date  . A-fib ([place])   . Acute deep vein thrombosis (DVT) of left upper extremity ([place]) 11/19/2016   unspecified vein, associated with PICC line  . Anginal pain ([place])   . Asthma    as a child  . BPPV (benign paroxysmal positional vertigo) 12/25/2015  . Brain tumor ([place]) 09/04/2016  . Coronary artery disease   . DDD (degenerative disc disease), lumbar   . Degenerative joint disease   . Dizziness and giddiness   . Dysrhythmia    A fib  . Episodic atrial fibrillation ([place])   . Esophageal reflux   . GERD (gastroesophageal reflux disease)   . Hiatal hernia   . History of kidney stones   . Hypertension, essential   . Kidney stones   . Liver cyst LEFT  . Myocardial infarction ([place])   . Nodule of parotid gland   . Non-Hodgkin lymphoma ([place])    Brain  . Paroxysmal atrial fibrillation ([place]) 09/01/2016   Chads2Vas score equals 3, but only 1 episodes in 2016,  recent event recorder showing no evidence of atrial fibrillation.  . Pneumonia   . PONV (postoperative nausea and vomiting)   . Pulmonary nodule   . PVC (premature ventricular contraction)    Nov 2017 & Feb 2018 noted on cardiac monitor worn at home    Past Surgical History:  Procedure Laterality Date  . APPLICATION OF CRANIAL NAVIGATION N/A 09/04/2016   Procedure: APPLICATION OF CRANIAL NAVIGATION;  Surgeon: Kimberly Lose, MD;  Location: [facility];  Service: Neurosurgery;  Laterality: N/A;  . BRAIN SURGERY  09/04/2016  . BREAST BIOPSY Bilateral   . CARPAL TUNNEL RELEASE Right 09/2013  . CATARACTS Bilateral   . CERVICAL SPINE SURGERY  04/2012  . FINGER SURGERY Right    TRIGGER FINGER RELEASE  . PARTIAL THYMECTOMY  1973   pt states doesn't remember this being done  . RETROSIGMOID CRANIECTOMY FOR TUMOR RESECTION Left 09/04/2016   Procedure: RETROSIGMOID CRANIECTOMY FOR TUMOR RESECTION WITH BRAINLAB NAVIGATION;  Surgeon: Kimberly Lose, MD;  Location: [facility];  Service: Neurosurgery;  Laterality: Left;  CRANIOTOMY TUMOR EXCISION  . TONSILLECTOMY  1964  . VAGINAL HYSTERECTOMY  1974    Current Medications: Current Meds  Medication Sig  . aspirin EC 81 MG tablet Take 81 mg by mouth daily.  Marland Kitchen atorvastatin (LIPITOR) 80 MG tablet Take 80 mg by mouth daily at 6 PM. Take one tablet daily  .  Carboxymethylcellulose Sod PF 0.5 % SOLN Apply to eye. One drop in left eye 4 times daily. Do not use with 10 minutes of Vigamox  . Cholecalciferol (VITAMIN D3) 2000 units TABS Take 2,000 Units by mouth daily.  . Cyanocobalamin (VITAMIN B-12) 5000 MCG SUBL Take 2,500 mcg by mouth. Take one tablet daily  . esomeprazole (NEXIUM) 40 MG capsule Take 40 mg by mouth daily.  . magnesium chloride (SLOW-MAG) 64 MG TBEC SR tablet Take 2 tablets by mouth 2 (two) times daily.  . metoprolol tartrate (LOPRESSOR) 50 MG tablet Take 1 tablet (50 mg total) by mouth 2 (two) times daily.  . nitroGLYCERIN (NITROSTAT) 0.4 MG  SL tablet Place 1 tablet (0.4 mg total) under the tongue every 5 (five) minutes as needed for chest pain.  Marland Kitchen ondansetron (ZOFRAN) 8 MG tablet Take 8 mg by mouth. Take one tablet every 8 hours as needed for nausea  . Potassium Chloride ER 20 MEQ TBCR Take by mouth. Take one tablet twice  daily  . rivaroxaban (XARELTO) 20 MG TABS tablet Take 20 mg by mouth daily with supper. 2*12/12/16     Allergies:   No known allergies   Social History   Socioeconomic History  . Marital status: Married    Spouse name: None  . Number of children: 1  . Years of education: 64  . Highest education level: None  Social Needs  . Financial resource strain: None  . Food insecurity - worry: None  . Food insecurity - inability: None  . Transportation needs - medical: None  . Transportation needs - non-medical: None  Occupational History  . Occupation: Retired school  Tobacco Use  . Smoking status: Former Smoker    Types: Cigarettes    Last attempt to quit: 10/23/2005    Years since quitting: 11.5  . Smokeless tobacco: Never Used  Substance and Sexual Activity  . Alcohol use: No  . Drug use: No  . Sexual activity: Not Currently    Comment: MARRIED  Other Topics Concern  . None  Social History Narrative   Lives with husband   Caffeine use: 2 cups coffee per day   Admitted to [facility]   Former smoker - stopped 2007   Alcohol none   DNR     Family History: The patient's family history includes Heart attack (age of onset: 74) in her father; Heart disease in her father; Heart disease (age of onset: 73) in her mother; Hypertension in her brother, daughter, mother, other, and sister; Lymphoma in her other and sister; Melanoma in her brother and other; Osteoarthritis in her other; Psoriasis in her mother; Rheum arthritis in her brother and mother. ROS:   Please see the history of present illness.    All 14 point review of systems negative except as described per history of present  illness  EKGs/Labs/Other Studies Reviewed:      Recent Labs: 09/08/2016: Magnesium 2.0 11/11/2016: ALT 6; BUN 9; Creatinine 0.4; Hemoglobin 8.2; Platelets 358; Potassium 3.8; Sodium 140  Recent Lipid Panel No results found for: CHOL, TRIG, HDL, CHOLHDL, VLDL, LDLCALC, LDLDIRECT  Physical Exam:    VS:  BP 120/70   Pulse 68   Ht 5\' 1"  (1.549 m)   Wt 148 lb 1.9 oz (67.2 kg)   LMP  (LMP Unknown)   SpO2 98%   BMI 27.99 kg/m     Wt Readings from Last 3 Encounters:  05/19/17 148 lb 1.9 oz (67.2 kg)  02/01/17  157 lb 6.4 oz (71.4 kg)  12/30/16 161 lb 1.9 oz (73.1 kg)     GEN:  Well nourished, well developed in no acute distress HEENT: Normal NECK: No JVD; No carotid bruits LYMPHATICS: No lymphadenopathy CARDIAC: RRR, no murmurs, no rubs, no gallops RESPIRATORY:  Clear to auscultation without rales, wheezing or rhonchi  ABDOMEN: Soft, non-tender, non-distended MUSCULOSKELETAL:  No edema; No deformity  SKIN: Warm and dry LOWER EXTREMITIES: no swelling NEUROLOGIC:  Alert and oriented x 3 PSYCHIATRIC:  Normal affect   ASSESSMENT:    1. Benign essential HTN   2. PAF (paroxysmal atrial fibrillation) ([place])   3. Coronary artery disease of native artery of native heart with stable angina pectoris ([place])    PLAN:    In order of problems listed above:  1. Essential hypertension: Blood pressure well controlled we will continue present management. 2. Axis mitral fibrillation: She is anticoagulated.  Her chads 2 Vascor equals 3. 3. Coronary artery disease: Stable we will continue present management.   Medication Adjustments/Labs and Tests Ordered: Current medicines are reviewed at length with the patient today.  Concerns regarding medicines are outlined above.  No orders of the defined types were placed in this encounter.  Medication changes: No orders of the defined types were placed in this encounter.   Signed, Park Liter, MD, [facility] 05/19/2017 10:35 AM    [place]

## 2017-05-19 NOTE — Patient Instructions (Signed)
Medication Instructions:  Your physician recommends that you continue on your current medications as directed. Please refer to the Current Medication list given to you today.  Labwork: None  Testing/Procedures: You had an EKG today.  Follow-Up: Your physician wants you to follow-up in: 4 months. You will receive a reminder letter in the mail two months in advance. If you don't receive a letter, please call our office to schedule the follow-up appointment.  Any Other Special Instructions Will Be Listed Below (If Applicable).     If you need a refill on your cardiac medications before your next appointment, please call your pharmacy.   

## 2017-06-06 DIAGNOSIS — N3000 Acute cystitis without hematuria: Secondary | ICD-10-CM

## 2017-06-06 HISTORY — DX: Acute cystitis without hematuria: N30.00

## 2017-07-27 ENCOUNTER — Telehealth: Payer: Self-pay | Admitting: Cardiology

## 2017-07-27 MED ORDER — METOPROLOL TARTRATE 25 MG PO TABS: 25.0000 mg | ORAL_TABLET | Freq: Two times a day (BID) | ORAL | 6 refills | Status: DC

## 2017-07-27 NOTE — Telephone Encounter (Signed)
Spoke with patient's daughter, Jeani Hawking, per DPR who states patient's blood pressure has been running low when she goes for cancer treatments and check ups. She states her blood pressure ranges anywhere from: 20-601 systolic and 56-15 diastolic. This morning her blood pressure was as low as 99/37 during a chemotherapy treatment. Jeani Hawking states they do not monitor her blood pressure at home. Patient's hematologist/oncologist advised to ask Dr. Agustin Cree if adjustments needed to be made to her metoprolol 50 mg twice daily. Will have Dr. Agustin Cree advise.

## 2017-07-27 NOTE — Telephone Encounter (Signed)
Please reduce metoprolol to 25 twice daily

## 2017-07-27 NOTE — Telephone Encounter (Signed)
Patient's daughter, Kimberly Vaughn, advised to decrease metoprolol from 50 mg to 25 mg twice daily. New prescription sent to [facility] in [place] per Lynn's request. Kimberly Vaughn verbalized understanding. No further questions.

## 2017-07-27 NOTE — Telephone Encounter (Signed)
Her blood pressure has been staying really low and her PCP doesn't think she needs to stay on the BP medicine

## 2017-07-27 NOTE — Addendum Note (Signed)
Addended by: Austin Miles on: 07/27/2017 03:05 PM   Modules accepted: Orders

## 2017-11-11 ENCOUNTER — Encounter: Payer: Self-pay | Admitting: Cardiology

## 2017-11-11 ENCOUNTER — Ambulatory Visit (INDEPENDENT_AMBULATORY_CARE_PROVIDER_SITE_OTHER): Payer: Medicare Other | Admitting: Cardiology

## 2017-11-11 VITALS — BP 110/58 | HR 73 | Ht 61.0 in | Wt 140.0 lb

## 2017-11-11 DIAGNOSIS — I480 Paroxysmal atrial fibrillation: Secondary | ICD-10-CM | POA: Diagnosis not present

## 2017-11-11 DIAGNOSIS — C8339 Primary central nervous system lymphoma: Secondary | ICD-10-CM

## 2017-11-11 DIAGNOSIS — I10 Essential (primary) hypertension: Secondary | ICD-10-CM | POA: Diagnosis not present

## 2017-11-11 DIAGNOSIS — I493 Ventricular premature depolarization: Secondary | ICD-10-CM

## 2017-11-11 DIAGNOSIS — I25118 Atherosclerotic heart disease of native coronary artery with other forms of angina pectoris: Secondary | ICD-10-CM

## 2017-11-11 DIAGNOSIS — C8589 Other specified types of non-Hodgkin lymphoma, extranodal and solid organ sites: Secondary | ICD-10-CM

## 2017-11-11 NOTE — Patient Instructions (Signed)
Medication Instructions:  Your physician recommends that you continue on your current medications as directed. Please refer to the Current Medication list given to you today.  Labwork: None  Testing/Procedures: None  Follow-Up: Your physician wants you to follow-up in: 5 months. You will receive a reminder letter in the mail two months in advance. If you don't receive a letter, please call our office to schedule the follow-up appointment.  Any Other Special Instructions Will Be Listed Below (If Applicable).     If you need a refill on your cardiac medications before your next appointment, please call your pharmacy.   

## 2017-11-11 NOTE — Progress Notes (Signed)
Cardiology Office Note:    Date:  11/11/2017   ID:  Kimberly Vaughn, DOB 1945/08/06, MRN [id number]  PCP:  Ernestene Kiel, MD  Cardiologist:  Jenne Campus, MD    Referring MD: Ernestene Kiel, MD   Chief Complaint  Patient presents with  . Follow-up  Doing well cardiac wise  History of Present Illness:    Kimberly Vaughn is a 72 y.o. female with paroxysmal atrial fibrillation also coronary artery disease PVCs.  Cardiac wise doing well denies having any frequent palpitations rare infrequent palpitations still present but no sustained arrhythmia.  She had some bad news about her lymphoma plan to chemotherapy that she was taken before stop working more grew again however she was switched to a different chemotherapy last time MRI was repeated and there is significant shrinking of her tumor she is scheduled to have another MRI today.  She is very optimistic.  Overall doing well tolerates chemotherapy very well  Past Medical History:  Diagnosis Date  . A-fib ([place])   . Acute deep vein thrombosis (DVT) of left upper extremity ([place]) 11/19/2016   unspecified vein, associated with PICC line  . Anginal pain ([place])   . Asthma    as a child  . BPPV (benign paroxysmal positional vertigo) 12/25/2015  . Brain tumor ([place]) 09/04/2016  . Coronary artery disease   . DDD (degenerative disc disease), lumbar   . Degenerative joint disease   . Dizziness and giddiness   . Dysrhythmia    A fib  . Episodic atrial fibrillation ([place])   . Esophageal reflux   . GERD (gastroesophageal reflux disease)   . Hiatal hernia   . History of kidney stones   . Hypertension, essential   . Kidney stones   . Liver cyst LEFT  . Myocardial infarction ([place])   . Nodule of parotid gland   . Non-Hodgkin lymphoma ([place])    Brain  . Paroxysmal atrial fibrillation ([place]) 09/01/2016   Chads2Vas score equals 3, but only 1 episodes in 2016, recent event recorder showing no evidence of atrial fibrillation.  . Pneumonia   .  PONV (postoperative nausea and vomiting)   . Pulmonary nodule   . PVC (premature ventricular contraction)    Nov 2017 & Feb 2018 noted on cardiac monitor worn at home    Past Surgical History:  Procedure Laterality Date  . APPLICATION OF CRANIAL NAVIGATION N/A 09/04/2016   Procedure: APPLICATION OF CRANIAL NAVIGATION;  Surgeon: Consuella Lose, MD;  Location: [facility];  Service: Neurosurgery;  Laterality: N/A;  . BRAIN SURGERY  09/04/2016  . BREAST BIOPSY Bilateral   . CARPAL TUNNEL RELEASE Right 09/2013  . CATARACTS Bilateral   . CERVICAL SPINE SURGERY  04/2012  . FINGER SURGERY Right    TRIGGER FINGER RELEASE  . PARTIAL THYMECTOMY  1973   pt states doesn't remember this being done  . RETROSIGMOID CRANIECTOMY FOR TUMOR RESECTION Left 09/04/2016   Procedure: RETROSIGMOID CRANIECTOMY FOR TUMOR RESECTION WITH BRAINLAB NAVIGATION;  Surgeon: Consuella Lose, MD;  Location: [facility];  Service: Neurosurgery;  Laterality: Left;  CRANIOTOMY TUMOR EXCISION  . TONSILLECTOMY  1964  . VAGINAL HYSTERECTOMY  1974    Current Medications: Current Meds  Medication Sig  . atorvastatin (LIPITOR) 80 MG tablet Take 80 mg by mouth daily at 6 PM. Take one tablet daily  . Carboxymethylcellulose Sod PF 0.5 % SOLN Apply to eye. One drop in left eye 4 times daily. Do not use with 10 minutes of Vigamox  .  Cholecalciferol (VITAMIN D3) 2000 units TABS Take 2,000 Units by mouth daily.  . Cyanocobalamin (VITAMIN B-12) 5000 MCG SUBL Take 5,000 mcg by mouth. Take one tablet daily  . esomeprazole (NEXIUM) 40 MG capsule Take 40 mg by mouth daily.  . magnesium chloride (SLOW-MAG) 64 MG TBEC SR tablet Take 1 tablet by mouth 2 (two) times daily.   . metoprolol tartrate (LOPRESSOR) 25 MG tablet Take 1 tablet (25 mg total) by mouth 2 (two) times daily.  . nitroGLYCERIN (NITROSTAT) 0.4 MG SL tablet Place 1 tablet (0.4 mg total) under the tongue every 5 (five) minutes as needed for chest pain.  Marland Kitchen ondansetron (ZOFRAN) 8 MG  tablet Take 8 mg by mouth. Take one tablet every 8 hours as needed for nausea  . Potassium Chloride ER 20 MEQ TBCR Take 1 tablet by mouth daily.   . rivaroxaban (XARELTO) 20 MG TABS tablet Take 10 mg by mouth daily with supper. 2*12/12/16   . tamsulosin (FLOMAX) 0.4 MG CAPS capsule Take 0.4 mg by mouth daily.     Allergies:   No known allergies   Social History   Socioeconomic History  . Marital status: Married    Spouse name: Not on file  . Number of children: 1  . Years of education: 14  . Highest education level: Not on file  Occupational History  . Occupation: Retired Armed forces technical officer  . Financial resource strain: Not on file  . Food insecurity:    Worry: Not on file    Inability: Not on file  . Transportation needs:    Medical: Not on file    Non-medical: Not on file  Tobacco Use  . Smoking status: Former Smoker    Types: Cigarettes    Last attempt to quit: 10/23/2005    Years since quitting: 12.0  . Smokeless tobacco: Never Used  Substance and Sexual Activity  . Alcohol use: No  . Drug use: No  . Sexual activity: Not Currently    Comment: MARRIED  Lifestyle  . Physical activity:    Days per week: Not on file    Minutes per session: Not on file  . Stress: Not on file  Relationships  . Social connections:    Talks on phone: Not on file    Gets together: Not on file    Attends religious service: Not on file    Active member of club or organization: Not on file    Attends meetings of clubs or organizations: Not on file    Relationship status: Not on file  Other Topics Concern  . Not on file  Social History Narrative   Lives with husband   Caffeine use: 2 cups coffee per day   Admitted to [facility]   Former smoker - stopped 2007   Alcohol none   DNR     Family History: The patient's family history includes Heart attack (age of onset: 69) in her father; Heart disease in her father; Heart disease (age of onset: 60) in her mother;  Hypertension in her brother, daughter, mother, other, and sister; Lymphoma in her other and sister; Melanoma in her brother and other; Osteoarthritis in her other; Psoriasis in her mother; Rheum arthritis in her brother and mother. ROS:   Please see the history of present illness.    All 14 point review of systems negative except as described per history of present illness  EKGs/Labs/Other Studies Reviewed:      Recent Labs: No  results found for requested labs within last 8760 hours.  Recent Lipid Panel No results found for: CHOL, TRIG, HDL, CHOLHDL, VLDL, LDLCALC, LDLDIRECT  Physical Exam:    VS:  BP (!) 110/58   Pulse 73   Ht 5\' 1"  (1.549 m)   Wt 140 lb (63.5 kg)   LMP  (LMP Unknown)   SpO2 97%   BMI 26.45 kg/m     Wt Readings from Last 3 Encounters:  11/11/17 140 lb (63.5 kg)  05/19/17 148 lb 1.9 oz (67.2 kg)  02/01/17 157 lb 6.4 oz (71.4 kg)     GEN:  Well nourished, well developed in no acute distress HEENT: Normal NECK: No JVD; No carotid bruits LYMPHATICS: No lymphadenopathy CARDIAC: RRR, no murmurs, no rubs, no gallops RESPIRATORY:  Clear to auscultation without rales, wheezing or rhonchi  ABDOMEN: Soft, non-tender, non-distended MUSCULOSKELETAL:  No edema; No deformity  SKIN: Warm and dry LOWER EXTREMITIES: no swelling NEUROLOGIC:  Alert and oriented x 3 PSYCHIATRIC:  Normal affect   ASSESSMENT:    1. Essential hypertension   2. Coronary artery disease of native artery of native heart with stable angina pectoris ([place])   3. PAF (paroxysmal atrial fibrillation) ([place])   4. PVC (premature ventricular contraction)   5. CNS lymphoma ([place])    PLAN:    In order of problems listed above:  1. Essential hypertension blood pressure controlled continue present management. 2. Coronary artery disease asymptomatic denies having any chest pain tightness squeezing pressure burning chest. 3. Paroxysmal atrial fibrillation.  Anticoagulated with small dose of Xarelto  since she is taking new chemotherapy that coronary after with anticoagulation.  No evidence of CVA.  She denies having any sustained arrhythmias 4. Lymphoma follow-up excellently by oncology team from [facility].   Medication Adjustments/Labs and Tests Ordered: Current medicines are reviewed at length with the patient today.  Concerns regarding medicines are outlined above.  No orders of the defined types were placed in this encounter.  Medication changes: No orders of the defined types were placed in this encounter.   Signed, Park Liter, MD, [facility] 11/11/2017 10:40 AM    [place]

## 2018-02-15 ENCOUNTER — Encounter (HOSPITAL_COMMUNITY): Payer: Self-pay

## 2018-02-15 ENCOUNTER — Emergency Department (HOSPITAL_COMMUNITY): Payer: Medicare Other

## 2018-02-15 ENCOUNTER — Inpatient Hospital Stay (HOSPITAL_COMMUNITY)
Admission: EM | Admit: 2018-02-15 | Discharge: 2018-02-24 | DRG: 871 | Disposition: A | Discharge status: Skilled Nursing Facility | Payer: Medicare Other | Attending: Internal Medicine | Admitting: Internal Medicine

## 2018-02-15 DIAGNOSIS — R471 Dysarthria and anarthria: Secondary | ICD-10-CM | POA: Diagnosis present

## 2018-02-15 DIAGNOSIS — Z66 Do not resuscitate: Secondary | ICD-10-CM | POA: Diagnosis present

## 2018-02-15 DIAGNOSIS — Z7189 Other specified counseling: Secondary | ICD-10-CM | POA: Diagnosis not present

## 2018-02-15 DIAGNOSIS — Z87898 Personal history of other specified conditions: Secondary | ICD-10-CM | POA: Diagnosis not present

## 2018-02-15 DIAGNOSIS — N390 Urinary tract infection, site not specified: Secondary | ICD-10-CM | POA: Diagnosis not present

## 2018-02-15 DIAGNOSIS — R5381 Other malaise: Secondary | ICD-10-CM | POA: Diagnosis present

## 2018-02-15 DIAGNOSIS — G510 Bell's palsy: Secondary | ICD-10-CM | POA: Diagnosis present

## 2018-02-15 DIAGNOSIS — Z87891 Personal history of nicotine dependence: Secondary | ICD-10-CM

## 2018-02-15 DIAGNOSIS — N12 Tubulo-interstitial nephritis, not specified as acute or chronic: Secondary | ICD-10-CM

## 2018-02-15 DIAGNOSIS — G9340 Encephalopathy, unspecified: Secondary | ICD-10-CM | POA: Diagnosis not present

## 2018-02-15 DIAGNOSIS — D709 Neutropenia, unspecified: Secondary | ICD-10-CM | POA: Diagnosis not present

## 2018-02-15 DIAGNOSIS — A419 Sepsis, unspecified organism: Secondary | ICD-10-CM | POA: Diagnosis present

## 2018-02-15 DIAGNOSIS — Z9221 Personal history of antineoplastic chemotherapy: Secondary | ICD-10-CM

## 2018-02-15 DIAGNOSIS — I119 Hypertensive heart disease without heart failure: Secondary | ICD-10-CM | POA: Diagnosis present

## 2018-02-15 DIAGNOSIS — R6521 Severe sepsis with septic shock: Secondary | ICD-10-CM | POA: Diagnosis present

## 2018-02-15 DIAGNOSIS — R4781 Slurred speech: Secondary | ICD-10-CM | POA: Diagnosis present

## 2018-02-15 DIAGNOSIS — N179 Acute kidney failure, unspecified: Secondary | ICD-10-CM | POA: Diagnosis present

## 2018-02-15 DIAGNOSIS — K219 Gastro-esophageal reflux disease without esophagitis: Secondary | ICD-10-CM | POA: Diagnosis present

## 2018-02-15 DIAGNOSIS — I252 Old myocardial infarction: Secondary | ICD-10-CM | POA: Diagnosis not present

## 2018-02-15 DIAGNOSIS — B964 Proteus (mirabilis) (morganii) as the cause of diseases classified elsewhere: Secondary | ICD-10-CM | POA: Diagnosis present

## 2018-02-15 DIAGNOSIS — I2510 Atherosclerotic heart disease of native coronary artery without angina pectoris: Secondary | ICD-10-CM | POA: Diagnosis present

## 2018-02-15 DIAGNOSIS — Z7982 Long term (current) use of aspirin: Secondary | ICD-10-CM

## 2018-02-15 DIAGNOSIS — R5081 Fever presenting with conditions classified elsewhere: Secondary | ICD-10-CM | POA: Diagnosis present

## 2018-02-15 DIAGNOSIS — Z9842 Cataract extraction status, left eye: Secondary | ICD-10-CM

## 2018-02-15 DIAGNOSIS — Z9841 Cataract extraction status, right eye: Secondary | ICD-10-CM

## 2018-02-15 DIAGNOSIS — E876 Hypokalemia: Secondary | ICD-10-CM | POA: Diagnosis present

## 2018-02-15 DIAGNOSIS — Z7901 Long term (current) use of anticoagulants: Secondary | ICD-10-CM

## 2018-02-15 DIAGNOSIS — I480 Paroxysmal atrial fibrillation: Secondary | ICD-10-CM | POA: Diagnosis present

## 2018-02-15 DIAGNOSIS — G9349 Other encephalopathy: Secondary | ICD-10-CM | POA: Diagnosis present

## 2018-02-15 DIAGNOSIS — R911 Solitary pulmonary nodule: Secondary | ICD-10-CM | POA: Diagnosis present

## 2018-02-15 DIAGNOSIS — C8599 Non-Hodgkin lymphoma, unspecified, extranodal and solid organ sites: Secondary | ICD-10-CM | POA: Diagnosis present

## 2018-02-15 DIAGNOSIS — E2740 Unspecified adrenocortical insufficiency: Secondary | ICD-10-CM | POA: Diagnosis present

## 2018-02-15 DIAGNOSIS — Z90710 Acquired absence of both cervix and uterus: Secondary | ICD-10-CM

## 2018-02-15 DIAGNOSIS — K449 Diaphragmatic hernia without obstruction or gangrene: Secondary | ICD-10-CM | POA: Diagnosis present

## 2018-02-15 DIAGNOSIS — Z86718 Personal history of other venous thrombosis and embolism: Secondary | ICD-10-CM

## 2018-02-15 DIAGNOSIS — Z8249 Family history of ischemic heart disease and other diseases of the circulatory system: Secondary | ICD-10-CM

## 2018-02-15 DIAGNOSIS — D638 Anemia in other chronic diseases classified elsewhere: Secondary | ICD-10-CM | POA: Diagnosis present

## 2018-02-15 DIAGNOSIS — I10 Essential (primary) hypertension: Secondary | ICD-10-CM | POA: Diagnosis not present

## 2018-02-15 DIAGNOSIS — Z79899 Other long term (current) drug therapy: Secondary | ICD-10-CM

## 2018-02-15 HISTORY — DX: Sepsis, unspecified organism: A41.9

## 2018-02-15 LAB — CBC WITH DIFFERENTIAL/PLATELET
BLASTS: 0 %
Band Neutrophils: 1 %
Basophils Absolute: 0 10*3/uL (ref 0.0–0.1)
Basophils Relative: 0 %
Eosinophils Absolute: 0 10*3/uL (ref 0.0–0.5)
Eosinophils Relative: 0 %
HCT: 36.8 % (ref 36.0–46.0)
Hemoglobin: 10.8 g/dL — ABNORMAL LOW (ref 12.0–15.0)
Lymphocytes Relative: 56 %
Lymphs Abs: 1.1 10*3/uL (ref 0.7–4.0)
MCH: 25.8 pg — ABNORMAL LOW (ref 26.0–34.0)
MCHC: 29.3 g/dL — ABNORMAL LOW (ref 30.0–36.0)
MCV: 88 fL (ref 80.0–100.0)
Metamyelocytes Relative: 6 %
Monocytes Absolute: 0.1 10*3/uL (ref 0.1–1.0)
Monocytes Relative: 4 %
Myelocytes: 0 %
Neutro Abs: 0.8 10*3/uL — ABNORMAL LOW (ref 1.7–7.7)
Neutrophils Relative %: 33 %
Other: 0 %
Platelets: 209 10*3/uL (ref 150–400)
Promyelocytes Relative: 0 %
RBC: 4.18 MIL/uL (ref 3.87–5.11)
RDW: 19.9 % — ABNORMAL HIGH (ref 11.5–15.5)
WBC: 2 10*3/uL — ABNORMAL LOW (ref 4.0–10.5)
nRBC: 0 % (ref 0.0–0.2)
nRBC: 0 /100 WBC

## 2018-02-15 LAB — COMPREHENSIVE METABOLIC PANEL
ALT: 22 U/L (ref 0–44)
AST: 19 U/L (ref 15–41)
Albumin: 3 g/dL — ABNORMAL LOW (ref 3.5–5.0)
Alkaline Phosphatase: 52 U/L (ref 38–126)
Anion gap: 14 (ref 5–15)
BUN: 20 mg/dL (ref 8–23)
CO2: 23 mmol/L (ref 22–32)
Calcium: 8.5 mg/dL — ABNORMAL LOW (ref 8.9–10.3)
Chloride: 103 mmol/L (ref 98–111)
Creatinine, Ser: 1.24 mg/dL — ABNORMAL HIGH (ref 0.44–1.00)
GFR calc non Af Amer: 43 mL/min — ABNORMAL LOW (ref >60)
GFR, EST AFRICAN AMERICAN: 50 mL/min — AB (ref >60)
Glucose, Bld: 99 mg/dL (ref 70–99)
Potassium: 3.1 mmol/L — ABNORMAL LOW (ref 3.5–5.1)
Sodium: 140 mmol/L (ref 135–145)
TOTAL PROTEIN: 5 g/dL — AB (ref 6.5–8.1)
Total Bilirubin: 0.9 mg/dL (ref 0.3–1.2)

## 2018-02-15 LAB — BASIC METABOLIC PANEL
Anion gap: 10 (ref 5–15)
BUN: 21 mg/dL (ref 8–23)
CHLORIDE: 106 mmol/L (ref 98–111)
CO2: 21 mmol/L — AB (ref 22–32)
Calcium: 7.8 mg/dL — ABNORMAL LOW (ref 8.9–10.3)
Creatinine, Ser: 1.01 mg/dL — ABNORMAL HIGH (ref 0.44–1.00)
GFR calc Af Amer: >60 mL/min (ref >60)
GFR calc non Af Amer: 55 mL/min — ABNORMAL LOW (ref >60)
Glucose, Bld: 99 mg/dL (ref 70–99)
Potassium: 3.1 mmol/L — ABNORMAL LOW (ref 3.5–5.1)
Sodium: 137 mmol/L (ref 135–145)

## 2018-02-15 LAB — URINALYSIS, ROUTINE W REFLEX MICROSCOPIC
Bilirubin Urine: NEGATIVE
GLUCOSE, UA: NEGATIVE mg/dL
HGB URINE DIPSTICK: NEGATIVE
Ketones, ur: NEGATIVE mg/dL
NITRITE: POSITIVE — AB
Protein, ur: 30 mg/dL — AB
Specific Gravity, Urine: 1.011 (ref 1.005–1.030)
WBC, UA: >50 WBC/hpf — ABNORMAL HIGH (ref 0–5)
pH: 8 (ref 5.0–8.0)

## 2018-02-15 LAB — I-STAT CG4 LACTIC ACID, ED
Lactic Acid, Venous: 5.83 mmol/L (ref 0.5–1.9)
Lactic Acid, Venous: 3.64 mmol/L (ref 0.5–1.9)

## 2018-02-15 LAB — LACTIC ACID, PLASMA
Lactic Acid, Venous: 2.5 mmol/L (ref 0.5–1.9)
Lactic Acid, Venous: 2.2 mmol/L (ref 0.5–1.9)

## 2018-02-15 LAB — GLUCOSE, CAPILLARY
Glucose-Capillary: 77 mg/dL (ref 70–99)
Glucose-Capillary: 84 mg/dL (ref 70–99)

## 2018-02-15 MED ORDER — LACTATED RINGERS IV BOLUS
500.0000 mL | Freq: Once | INTRAVENOUS | Status: AC
  Administered 2018-02-15: 500 mL via INTRAVENOUS

## 2018-02-15 MED ORDER — LACTATED RINGERS IV SOLN
INTRAVENOUS | Status: AC
  Administered 2018-02-15: 20:00:00 via INTRAVENOUS

## 2018-02-15 MED ORDER — SODIUM CHLORIDE 0.9 % IV SOLN
2.0000 g | Freq: Once | INTRAVENOUS | Status: AC
  Administered 2018-02-15: 2 g via INTRAVENOUS
  Filled 2018-02-15: qty 2

## 2018-02-15 MED ORDER — SODIUM CHLORIDE 0.9 % IV SOLN
250.0000 mL | INTRAVENOUS | Status: DC
  Administered 2018-02-16 (×2): 250 mL via INTRAVENOUS

## 2018-02-15 MED ORDER — ACETAMINOPHEN 650 MG RE SUPP
650.0000 mg | Freq: Once | RECTAL | Status: AC
  Administered 2018-02-15: 650 mg via RECTAL
  Filled 2018-02-15: qty 1

## 2018-02-15 MED ORDER — VANCOMYCIN HCL IN DEXTROSE 1-5 GM/200ML-% IV SOLN
1000.0000 mg | INTRAVENOUS | Status: DC
  Administered 2018-02-17: 1000 mg via INTRAVENOUS
  Filled 2018-02-15: qty 200

## 2018-02-15 MED ORDER — ACETAMINOPHEN 650 MG RE SUPP
650.0000 mg | RECTAL | Status: DC | PRN
  Administered 2018-02-15: 650 mg via RECTAL
  Filled 2018-02-15: qty 1

## 2018-02-15 MED ORDER — METRONIDAZOLE IN NACL 5-0.79 MG/ML-% IV SOLN
500.0000 mg | Freq: Three times a day (TID) | INTRAVENOUS | Status: DC
  Administered 2018-02-15 – 2018-02-17 (×6): 500 mg via INTRAVENOUS
  Filled 2018-02-15 (×7): qty 100

## 2018-02-15 MED ORDER — ACETAMINOPHEN 325 MG PO TABS: 650.0000 mg | ORAL_TABLET | ORAL | Status: DC | PRN

## 2018-02-15 MED ORDER — LACTATED RINGERS IV SOLN: INTRAVENOUS | Status: AC

## 2018-02-15 MED ORDER — LACTATED RINGERS IV BOLUS
1000.0000 mL | Freq: Once | INTRAVENOUS | Status: AC
  Administered 2018-02-15: 1000 mL via INTRAVENOUS

## 2018-02-15 MED ORDER — ENOXAPARIN SODIUM 40 MG/0.4ML ~~LOC~~ SOLN
40.0000 mg | SUBCUTANEOUS | Status: DC
  Administered 2018-02-15: 40 mg via SUBCUTANEOUS
  Filled 2018-02-15: qty 0.4

## 2018-02-15 MED ORDER — SODIUM CHLORIDE 0.9 % IV SOLN
2.0000 g | INTRAVENOUS | Status: DC
  Administered 2018-02-16: 2 g via INTRAVENOUS
  Filled 2018-02-15 (×2): qty 2

## 2018-02-15 MED ORDER — SODIUM CHLORIDE 0.9 % IV SOLN
INTRAVENOUS | Status: DC | PRN
  Administered 2018-02-15 – 2018-02-19 (×3): via INTRA_ARTERIAL

## 2018-02-15 MED ORDER — INSULIN ASPART 100 UNIT/ML ~~LOC~~ SOLN
1.0000 [IU] | SUBCUTANEOUS | Status: DC
  Administered 2018-02-17 – 2018-02-18 (×4): 1 [IU] via SUBCUTANEOUS
  Administered 2018-02-19 – 2018-02-20 (×2): 2 [IU] via SUBCUTANEOUS
  Administered 2018-02-20 – 2018-02-21 (×2): 1 [IU] via SUBCUTANEOUS
  Administered 2018-02-22: 2 [IU] via SUBCUTANEOUS
  Administered 2018-02-22 – 2018-02-23 (×2): 1 [IU] via SUBCUTANEOUS
  Administered 2018-02-24: 2 [IU] via SUBCUTANEOUS

## 2018-02-15 MED ORDER — VANCOMYCIN HCL IN DEXTROSE 1-5 GM/200ML-% IV SOLN: 1000.0000 mg | Freq: Once | INTRAVENOUS | Status: DC

## 2018-02-15 MED ORDER — NOREPINEPHRINE 4 MG/250ML-% IV SOLN
0.0000 ug/min | INTRAVENOUS | Status: DC
  Administered 2018-02-15: 2 ug/min via INTRAVENOUS
  Filled 2018-02-15: qty 250

## 2018-02-15 MED ORDER — LACTATED RINGERS IV SOLN
INTRAVENOUS | Status: AC
  Administered 2018-02-15 – 2018-02-16 (×2): via INTRAVENOUS

## 2018-02-15 MED ORDER — VANCOMYCIN HCL 10 G IV SOLR
1250.0000 mg | Freq: Once | INTRAVENOUS | Status: AC
  Administered 2018-02-15: 1250 mg via INTRAVENOUS
  Filled 2018-02-15: qty 1250

## 2018-02-15 MED ORDER — NOREPINEPHRINE 4 MG/250ML-% IV SOLN
5.0000 ug/min | INTRAVENOUS | Status: DC
  Administered 2018-02-15: 12 ug/min via INTRAVENOUS
  Administered 2018-02-15: 19 ug/min via INTRAVENOUS
  Administered 2018-02-15: 20 ug/min via INTRAVENOUS
  Administered 2018-02-16: 14 ug/min via INTRAVENOUS
  Administered 2018-02-16: 16 ug/min via INTRAVENOUS
  Filled 2018-02-15: qty 250
  Filled 2018-02-15: qty 500
  Filled 2018-02-15: qty 250

## 2018-02-15 NOTE — Procedures (Signed)
Arterial Catheter Insertion Procedure Note Kimberly Vaughn [id number] 1945-03-11  Procedure: Insertion of Arterial Catheter  Indications: Blood pressure monitoring, Frequent blood sampling and flotrac  Procedure Details Consent: Risks of procedure as well as the alternatives and risks of each were explained to the (patient/caregiver).  Consent for procedure obtained. Time Out: Verified patient identification, verified procedure, site/side was marked, verified correct patient position, special equipment/implants available, medications/allergies/relevent history reviewed, required imaging and test results available.  Performed  Maximum sterile technique was used including antiseptics, cap, gloves, gown, hand hygiene, mask and sheet. Skin prep: Chlorhexidine; local anesthetic administered 20 gauge catheter was inserted into left radial artery using the Seldinger technique. ULTRASOUND GUIDANCE USED: NO Evaluation Blood flow good; BP tracing good. Complications: No apparent complications.   Kimberly Vaughn 02/15/2018

## 2018-02-15 NOTE — Progress Notes (Signed)
CRITICAL VALUE ALERT  Critical Value: Lactic Acid 2.2  Date & Time Notified: 02/15/18 @ 2148  Provider Notified: Warren Lacy Provider

## 2018-02-15 NOTE — Progress Notes (Signed)
[facility] Progress Note Patient Name: Kimberly Vaughn DOB: [birth year] MRN: [id number]   Date of Service  02/15/2018  HPI/Events of Note  Pt is NPO and documented to have a temp of 100.7.  eICU Interventions  Tylenol supp ordered.     Intervention Category Minor Interventions: Other:  Elsie Lincoln 02/15/2018, 10:14 PM

## 2018-02-15 NOTE — Progress Notes (Signed)
Pharmacy Antibiotic Note  Kimberly Vaughn is a 72 y.o. female admitted on 02/15/2018 with sepsis.  Pharmacy has been consulted for vancomycin and cefepime dosing. Pt with tmax 100.7 and low WBC of 2. SCr is slightly elevated at 1.24. First doses already given per MD.   Plan: Vancomycin 1gm IV Q36H Cefepime 2gm IV Q24h F/u real fxn, C&S, clinical status and vanc peak/trough PRN  Weight: 139 lb 15.9 oz (63.5 kg)  Temp (24hrs), Avg:100.7 F (38.2 C), Min:100.7 F (38.2 C), Max:100.7 F (38.2 C)  Recent Labs  Lab 02/15/18 1218 02/15/18 1252 02/15/18 1613  WBC 2.0*  --   --   CREATININE 1.24*  --   --   LATICACIDVEN  --  5.83* 3.64*    Estimated Creatinine Clearance: 34 mL/min (A) (by C-G formula based on SCr of 1.24 mg/dL (H)).    Allergies  Allergen Reactions  . No Known Allergies Other (See Comments)    Antimicrobials this admission: Vanc 12/17>> Cefepime 12/17>> Flagyl 12/17>>  Dose adjustments this admission: N/A  Microbiology results: Pending  Thank you for allowing pharmacy to be a part of this patient's care.  Subrena Devereux, Rande Lawman 02/15/2018 5:05 PM

## 2018-02-15 NOTE — H&P (Signed)
NAME:  Kimberly Vaughn, MRN:  [id number], DOB:  1946-01-18, LOS: 0 ADMISSION DATE:  02/15/2018, CONSULTATION DATE:  02/15/18 REFERRING MD:  Maryruth Bun - [facility] ED, CHIEF COMPLAINT:  Septic shock.   HPI/course in hospital  72 year old woman who was in her usual state of health until this morning when she has several presyncopal episodes at home with an episode of unresponsiveness and possible shaking.   She received her last treatment for lymphoma at [facility] last Thursday for CNS lymphoma diagnosed 09/04/2016.  Treated initially with R-MTX. Continued slow progression. Started on Revlimid in 01/06/18.   Past Medical History   Past Medical History:  Diagnosis Date  . A-fib ([place])   . Acute deep vein thrombosis (DVT) of left upper extremity ([place]) 11/19/2016   unspecified vein, associated with PICC line  . Anginal pain ([place])   . Asthma    as a child  . BPPV (benign paroxysmal positional vertigo) 12/25/2015  . Brain tumor ([place]) 09/04/2016  . Coronary artery disease PCI 04/2017   . DDD (degenerative disc disease), lumbar   . Degenerative joint disease   . Dizziness and giddiness   . Dysrhythmia    A fib  . Episodic atrial fibrillation ([place])   . Esophageal reflux   . GERD (gastroesophageal reflux disease)   . Hiatal hernia   . History of kidney stones   . Hypertension, essential   . Kidney stones   . Liver cyst LEFT  . Myocardial infarction ([place])   . Nodule of parotid gland   . Non-Hodgkin lymphoma ([place])    Brain  . Paroxysmal atrial fibrillation ([place]) 09/01/2016   Chads2Vas score equals 3, but only 1 episodes in 2016, recent event recorder showing no evidence of atrial fibrillation.  . Pneumonia   . PONV (postoperative nausea and vomiting)   . Pulmonary nodule   . PVC (premature ventricular contraction)    Nov 2017 & Feb 2018 noted on cardiac monitor worn at home    Past Surgical History:  Procedure Laterality Date  . APPLICATION OF CRANIAL NAVIGATION N/A 09/04/2016   Procedure:  APPLICATION OF CRANIAL NAVIGATION;  Surgeon: Consuella Lose, MD;  Location: [place];  Service: Neurosurgery;  Laterality: N/A;  . BRAIN SURGERY  09/04/2016  . BREAST BIOPSY Bilateral   . CARPAL TUNNEL RELEASE Right 09/2013  . CATARACTS Bilateral   . CERVICAL SPINE SURGERY  04/2012  . FINGER SURGERY Right    TRIGGER FINGER RELEASE  . PARTIAL THYMECTOMY  1973   pt states doesn't remember this being done  . RETROSIGMOID CRANIECTOMY FOR TUMOR RESECTION Left 09/04/2016   Procedure: RETROSIGMOID CRANIECTOMY FOR TUMOR RESECTION WITH BRAINLAB NAVIGATION;  Surgeon: Consuella Lose, MD;  Location: [place];  Service: Neurosurgery;  Laterality: Left;  CRANIOTOMY TUMOR EXCISION  . TONSILLECTOMY  1964  . VAGINAL HYSTERECTOMY  1974     Review of Systems:   Review of Systems  Constitutional: Positive for malaise/fatigue. Negative for chills and fever.  HENT: Negative for congestion and ear discharge.        Baseline facial droop  Eyes: Negative.   Respiratory: Negative for cough and shortness of breath.   Cardiovascular: Positive for palpitations.  Gastrointestinal: Positive for nausea and vomiting. Negative for diarrhea.  Genitourinary: Positive for flank pain. Negative for dysuria.  Skin: Negative.   Neurological: Positive for dizziness.  Endo/Heme/Allergies: Negative.   Psychiatric/Behavioral: Negative.     Social History   reports that she quit smoking about 12 years ago.  Her smoking use included cigarettes. She has never used smokeless tobacco. She reports that she does not drink alcohol or use drugs.   Family History   Her family history includes Heart attack (age of onset: 21) in her father; Heart disease in her father; Heart disease (age of onset: 89) in her mother; Hypertension in her brother, daughter, mother, sister, and another family member; Lymphoma in her sister and another family member; Melanoma in her brother and another family member; Osteoarthritis in an other family member;  Psoriasis in her mother; Rheum arthritis in her brother and mother.   Allergies Allergies  Allergen Reactions  . No Known Allergies Other (See Comments)     Home Medications  Prior to Admission medications   Medication Sig Start Date End Date Taking? Authorizing Provider  atorvastatin (LIPITOR) 80 MG tablet Take 80 mg by mouth daily at 6 PM.    Yes [provider]  Carboxymethylcellulose Sod PF 0.5 % SOLN Apply to eye. One drop in left eye 4 times daily.   Yes [provider]  Cholecalciferol (VITAMIN D3) 2000 units TABS Take 2,000 Units by mouth daily.   Yes [provider]  Cyanocobalamin (VITAMIN B-12) 5000 MCG SUBL Take 5,000 mcg by mouth daily.    Yes [provider]  erythromycin ophthalmic ointment Place 1 application into the left eye 4 (four) times daily.   Yes [provider]  esomeprazole (NEXIUM) 40 MG capsule Take 40 mg by mouth daily. 07/27/16  Yes [provider]  magnesium chloride (SLOW-MAG) 64 MG TBEC SR tablet Take 1 tablet by mouth 2 (two) times daily.    Yes [provider]  metoprolol tartrate (LOPRESSOR) 25 MG tablet Take 1 tablet (25 mg total) by mouth 2 (two) times daily. 07/27/17  Yes Park Liter, MD  nitroGLYCERIN (NITROSTAT) 0.4 MG SL tablet Place 1 tablet (0.4 mg total) under the tongue every 5 (five) minutes as needed for chest pain. 12/30/16  Yes Park Liter, MD  ondansetron (ZOFRAN) 8 MG tablet Take 8 mg by mouth. Take one tablet every 8 hours as needed for nausea   Yes [provider]  Potassium Chloride ER 20 MEQ TBCR Take 1 tablet by mouth 2 (two) times daily.    Yes [provider]  rivaroxaban (XARELTO) 10 MG TABS tablet Take 10 mg by mouth daily with supper.    Yes [provider]  sennosides-docusate sodium (SENOKOT-S) 8.6-50 MG tablet Take 1 tablet by mouth 2 (two) times daily.   Yes [provider]  tamsulosin (FLOMAX) 0.4 MG CAPS capsule  Take 0.4 mg by mouth daily.   Yes [provider]  aspirin EC 81 MG tablet Take 81 mg by mouth daily.    [provider]     Interim history/subjective:  Presently she is awake and denies specific discomfort on questioning.   Initially presented hypotensive. Started on sepsis bundle by ED MD.   Objective   Blood pressure (!) 73/42, pulse 92, temperature (!) 100.7 F (38.2 C), temperature source Rectal, resp. rate (!) 27, weight 63.5 kg, SpO2 99 %.        Intake/Output Summary (Last 24 hours) at 02/15/2018 1650 Last data filed at 02/15/2018 1549 Gross per 24 hour  Intake 1000 ml  Output -  Net 1000 ml   Filed Weights   02/15/18 1237  Weight: 63.5 kg    Examination: Physical Exam  Constitutional: She is oriented to person, place, and time. She  appears well-developed and well-nourished. No distress.  HENT:  Left facial droop  Eyes: Pupils are equal, round, and reactive to light. Conjunctivae are normal. Right eye exhibits no discharge. No scleral icterus.  Neck: Neck supple.  Cardiovascular: Normal rate, regular rhythm and normal heart sounds.  JVP flat  Respiratory: Effort normal and breath sounds normal. No respiratory distress.  GI: She exhibits no distension. There is no hepatosplenomegaly or hepatomegaly. There is abdominal tenderness in the right upper quadrant. There is no rebound, no guarding and no CVA tenderness.  Tenderness to ballottement of right kidney. Right flank tenderness.  Neurological: She is alert and oriented to person, place, and time. She has normal strength. GCS eye subscore is 4. GCS verbal subscore is 5. GCS motor subscore is 6.  Skin: She is not diaphoretic.  Digits cool, knees mottled   Psychiatric: She has a normal mood and affect. Her speech is normal and behavior is normal. Cognition and memory are normal.     Bedside POC echo shows normal function RV, LV. + respiratory variation of IVC  Ancillary tests (personally  reviewed)  CBC: Recent Labs  Lab 02/15/18 1218  WBC 2.0*  NEUTROABS 0.8*  HGB 10.8*  HCT 36.8  MCV 88.0  PLT 161    Basic Metabolic Panel: Recent Labs  Lab 02/15/18 1218  NA 140  K 3.1*  CL 103  CO2 23  GLUCOSE 99  BUN 20  CREATININE 1.24*  CALCIUM 8.5*   GFR: Estimated Creatinine Clearance: 34 mL/min (A) (by C-G formula based on SCr of 1.24 mg/dL (H)). Recent Labs  Lab 02/15/18 1218 02/15/18 1252 02/15/18 1613  WBC 2.0*  --   --   LATICACIDVEN  --  5.83* 3.64*    Liver Function Tests: Recent Labs  Lab 02/15/18 1218  AST 19  ALT 22  ALKPHOS 52  BILITOT 0.9  PROT 5.0*  ALBUMIN 3.0*   No results for input(s): LIPASE, AMYLASE in the last 168 hours. No results for input(s): AMMONIA in the last 168 hours.  ABG    Component Value Date/Time   PHART 7.365 09/04/2016 1140   PCO2ART 46.9 09/04/2016 1140   PO2ART 201.0 (H) 09/04/2016 1140   HCO3 27.1 09/04/2016 1140   TCO2 29 09/04/2016 1140   O2SAT 100.0 09/04/2016 1140    Urinalysis    Component Value Date/Time   COLORURINE AMBER (A) 02/15/2018 1350   APPEARANCEUR HAZY (A) 02/15/2018 1350   LABSPEC 1.011 02/15/2018 1350   PHURINE 8.0 02/15/2018 1350   GLUCOSEU NEGATIVE 02/15/2018 1350   HGBUR NEGATIVE 02/15/2018 1350   BILIRUBINUR NEGATIVE 02/15/2018 1350   KETONESUR NEGATIVE 02/15/2018 1350   PROTEINUR 30 (A) 02/15/2018 1350   NITRITE POSITIVE (A) 02/15/2018 1350   LEUKOCYTESUR SMALL (A) 02/15/2018 1350     Assessment & Plan:  Critically ill due to septic shock associated with febrile neutropenia (ANC<1000), requiring titration of vasopressors to maintain adequate perfusion and frequent reassessment of volume status.  Still likely volume responsive by clinical and echo parameters.  Possible urinary source given flank kidney pain suggestive of pyelonephritis. CNS lymphoma on chemotherapy. Hypokalemia. History of DVT and AF requiring Xarelto  PLAN  Continue current cefepime and  vancomycin Titrate norepinephrine to maintain MAP>65 Flowtrack for volume optimization. Continue fluid resuscitation with LR given hypokalemia Prophylactic dose Lovenox only initially with goal to restart Xarelto ideally once stabilized. Monitor ANC daily Consult hematology regarding appropriateness of colony stimulating factors. Hold home medication until stabilizes.  Best practice:  Diet: NPO for now Pain/Anxiety/Delirium protocol (if indicated): prn non-narcotics VAP protocol (if indicated): n/a DVT prophylaxis: Lovenox - resume Xarelto when stable. GI prophylaxis: N/A Glucose control: SSI  Mobility: Bed rest Code Status: DNR/DNI as per patient's wishes - personally confirmed. Family Communication: updated at bedside. Disposition: ICU   Critical care time: 40 min including time reviewing chart, examining patient, assessing pressor needs and volume status.     Kipp Brood, MD [facility] ICU Physician [place]  Pager: [phone] Mobile: [phone] After hours: [phone].  02/15/2018, 4:50 PM

## 2018-02-15 NOTE — ED Provider Notes (Signed)
[facility]ERGENCY DEPARTMENT Provider Note   CSN: [id number] Arrival date & time: 02/15/18  1208     History   Chief Complaint Chief Complaint  Patient presents with  . Altered Mental Status    HPI Kimberly Vaughn is a 72 y.o. female.  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  EMS was called for multiple syncopal episodes with periods of apnea they.  When EMS transported her, she had one episode of brief unresponsiveness without any associated shaking or jerking activity.  She then had a period of apnea requiring some assisted ventilations.  She has since been breathing spontaneously.  She receives oncology care at [facility] and is on chemotherapy. She has L facial droop and slurred speech at baseline.  LEVEL 5 CAVEAT DUE TO AMS  The history is provided by the EMS personnel. The history is limited by the condition of the patient.  Altered Mental Status      Past Medical History:  Diagnosis Date  . A-fib ([place])   . Acute deep vein thrombosis (DVT) of left upper extremity ([place]) 11/19/2016   unspecified vein, associated with PICC line  . Anginal pain ([place])   . Asthma    as a child  . BPPV (benign paroxysmal positional vertigo) 12/25/2015  . Brain tumor ([place]) 09/04/2016  . Coronary artery disease   . DDD (degenerative disc disease), lumbar   . Degenerative joint disease   . Dizziness and giddiness   . Dysrhythmia    A fib  . Episodic atrial fibrillation ([place])   . Esophageal reflux   . GERD (gastroesophageal reflux disease)   . Hiatal hernia   . History of kidney stones   . Hypertension, essential   . Kidney stones   . Liver cyst LEFT  . Myocardial infarction ([place])   . Nodule of parotid gland   . Non-Hodgkin lymphoma ([place])    Brain  . Paroxysmal atrial fibrillation ([place]) 09/01/2016   Chads2Vas score equals 3, but only 1 episodes in 2016, recent event  recorder showing no evidence of atrial fibrillation.  . Pneumonia   . PONV (postoperative nausea and vomiting)   . Pulmonary nodule   . PVC (premature ventricular contraction)    Nov 2017 & Feb 2018 noted on cardiac monitor worn at home    Patient Active Problem List   Diagnosis Date Noted  . Lagophthalmos of left upper eyelid 12/03/2016  . Corneal abrasion, left, sequela 12/03/2016  . Cranial nerve VI palsy, left 11/26/2016  . Cranial nerve VII palsy 11/26/2016  . Anemia associated with chemotherapy 11/26/2016  . Vitamin D deficiency 11/26/2016  . Acute deep vein thrombosis (DVT) of left upper extremity ([place]) 11/19/2016  . Severe sepsis ([place]) 11/12/2016  . Bacteremia due to methicillin susceptible Staphylococcus aureus (MSSA) 11/12/2016  . Klebsiella cystitis 11/12/2016  . Pancytopenia ([place]) 11/12/2016  . Acute megaloblastic anemia due to severe illness 11/12/2016  . Bilateral lower extremity edema 11/12/2016  . Hyperlipidemia 11/12/2016  . CNS lymphoma ([place])   . Diffuse large B cell lymphoma ([place]) 09/16/2016  . Orthostatic hypotension   . Premature atrial complex   . Steroid-induced hyperglycemia   . Labile blood pressure   . Dysphagia   . PVC (premature ventricular contraction)   . Hypoalbuminemia due to protein-calorie malnutrition ([place])   . Leucocytosis   . Brain mass 09/09/2016  . Ataxia   . Benign  essential HTN   . PAF (paroxysmal atrial fibrillation) ([place])   . Gastroesophageal reflux disease   . Diarrhea   . History of MI (myocardial infarction)   . Acute blood loss anemia   . Hypokalemia   . Post-operative pain   . Mass of brain 09/04/2016  . Brain tumor ([place]) 09/04/2016  . Coronary artery disease 09/01/2016  . Paroxysmal atrial fibrillation ([place]) 09/01/2016  . Essential hypertension 09/01/2016  . BPPV (benign paroxysmal positional vertigo) 12/25/2015    Past Surgical History:  Procedure Laterality Date  . APPLICATION OF CRANIAL NAVIGATION N/A 09/04/2016     Procedure: APPLICATION OF CRANIAL NAVIGATION;  Surgeon: Consuella Lose, MD;  Location: [facility];  Service: Neurosurgery;  Laterality: N/A;  . BRAIN SURGERY  09/04/2016  . BREAST BIOPSY Bilateral   . CARPAL TUNNEL RELEASE Right 09/2013  . CATARACTS Bilateral   . CERVICAL SPINE SURGERY  04/2012  . FINGER SURGERY Right    TRIGGER FINGER RELEASE  . PARTIAL THYMECTOMY  1973   pt states doesn't remember this being done  . RETROSIGMOID CRANIECTOMY FOR TUMOR RESECTION Left 09/04/2016   Procedure: RETROSIGMOID CRANIECTOMY FOR TUMOR RESECTION WITH BRAINLAB NAVIGATION;  Surgeon: Consuella Lose, MD;  Location: [facility];  Service: Neurosurgery;  Laterality: Left;  CRANIOTOMY TUMOR EXCISION  . TONSILLECTOMY  1964  . VAGINAL HYSTERECTOMY  1974     OB History   No obstetric history on file.      Home Medications    Prior to Admission medications   Medication Sig Start Date End Date Taking? Authorizing Provider  atorvastatin (LIPITOR) 80 MG tablet Take 80 mg by mouth daily at 6 PM.    Yes [provider]  Carboxymethylcellulose Sod PF 0.5 % SOLN Apply to eye. One drop in left eye 4 times daily.   Yes [provider]  Cholecalciferol (VITAMIN D3) 2000 units TABS Take 2,000 Units by mouth daily.   Yes [provider]  Cyanocobalamin (VITAMIN B-12) 5000 MCG SUBL Take 5,000 mcg by mouth daily.    Yes [provider]  erythromycin ophthalmic ointment Place 1 application into the left eye 4 (four) times daily.   Yes [provider]  esomeprazole (NEXIUM) 40 MG capsule Take 40 mg by mouth daily. 07/27/16  Yes [provider]  magnesium chloride (SLOW-MAG) 64 MG TBEC SR tablet Take 1 tablet by mouth 2 (two) times daily.    Yes [provider]  metoprolol tartrate (LOPRESSOR) 25 MG tablet Take 1 tablet (25 mg total) by mouth 2 (two) times daily. 07/27/17  Yes Park Liter, MD  nitroGLYCERIN (NITROSTAT) 0.4 MG SL tablet Place 1 tablet (0.4  mg total) under the tongue every 5 (five) minutes as needed for chest pain. 12/30/16  Yes Park Liter, MD  ondansetron (ZOFRAN) 8 MG tablet Take 8 mg by mouth. Take one tablet every 8 hours as needed for nausea   Yes [provider]  Potassium Chloride ER 20 MEQ TBCR Take 1 tablet by mouth 2 (two) times daily.    Yes [provider]  rivaroxaban (XARELTO) 10 MG TABS tablet Take 10 mg by mouth daily with supper.    Yes [provider]  sennosides-docusate sodium (SENOKOT-S) 8.6-50 MG tablet Take 1 tablet by mouth 2 (two) times daily.   Yes [provider]  tamsulosin (FLOMAX) 0.4 MG CAPS capsule Take 0.4 mg by mouth daily.   Yes [provider]  aspirin EC 81 MG tablet Take 81 mg  by mouth daily.    [provider]    Family History Family History  Problem Relation Age of Onset  . Heart disease Mother 80  . Psoriasis Mother        PUSTULAR  . Hypertension Mother   . Rheum arthritis Mother   . Heart attack Father 50  . Heart disease Father   . Lymphoma Sister   . Hypertension Sister   . Melanoma Brother   . Hypertension Brother   . Rheum arthritis Brother   . Hypertension Daughter   . Lymphoma Other   . Osteoarthritis Other   . Melanoma Other   . Hypertension Other     Social History Social History   Tobacco Use  . Smoking status: Former Smoker    Types: Cigarettes    Last attempt to quit: 10/23/2005    Years since quitting: 12.3  . Smokeless tobacco: Never Used  Substance Use Topics  . Alcohol use: No  . Drug use: No     Allergies   No known allergies   Review of Systems Review of Systems  Unable to perform ROS: Mental status change     Physical Exam Updated Vital Signs BP (!) 66/37   Pulse 87   Temp (!) 100.7 F (38.2 C) (Rectal)   Resp 17   Wt 63.5 kg   LMP  (LMP Unknown)   SpO2 100%   BMI 26.45 kg/m   Physical Exam Vitals signs and nursing note reviewed.  Constitutional:      General:  She is not in acute distress.    Appearance: She is well-developed. She is ill-appearing.  HENT:     Head: Normocephalic and atraumatic.  Eyes:     Extraocular Movements: Extraocular movements intact.     Pupils: Pupils are equal, round, and reactive to light.     Comments: L eye with adhesion of upper and lower lids along lateral edge  Neck:     Musculoskeletal: Neck supple.  Cardiovascular:     Rate and Rhythm: Normal rate and regular rhythm.     Heart sounds: Normal heart sounds. No murmur.  Pulmonary:     Effort: Pulmonary effort is normal.     Breath sounds: Normal breath sounds.  Abdominal:     General: Bowel sounds are normal. There is no distension.     Palpations: Abdomen is soft.     Tenderness: There is no abdominal tenderness.  Skin:    General: Skin is warm and dry.  Neurological:     Mental Status: She is alert.     Comments: L facial weakness at mouth, L ptosis, severe dysarthria, moving all 4 extremities in withdrawal to pain; some tremoring of RUE but no rhythmic jerks or gaze deviation  Psychiatric:        Judgment: Judgment normal.      ED Treatments / Results  Labs (all labs ordered are listed, but only abnormal results are displayed) Labs Reviewed  COMPREHENSIVE METABOLIC PANEL - Abnormal; Notable for the following components:      Result Value   Potassium 3.1 (*)    Creatinine, Ser 1.24 (*)    Calcium 8.5 (*)    Total Protein 5.0 (*)    Albumin 3.0 (*)    GFR calc non Af Amer 43 (*)    GFR calc Af Amer 50 (*)    All other components within normal limits  CBC WITH DIFFERENTIAL/PLATELET - Abnormal; Notable for the following components:  WBC 2.0 (*)    Hemoglobin 10.8 (*)    MCH 25.8 (*)    MCHC 29.3 (*)    RDW 19.9 (*)    Neutro Abs 0.8 (*)    All other components within normal limits  URINALYSIS, ROUTINE W REFLEX MICROSCOPIC - Abnormal; Notable for the following components:   Color, Urine AMBER (*)    APPearance HAZY (*)    Protein, ur 30  (*)    Nitrite POSITIVE (*)    Leukocytes, UA SMALL (*)    WBC, UA >50 (*)    Bacteria, UA MANY (*)    All other components within normal limits  I-STAT CG4 LACTIC ACID, ED - Abnormal; Notable for the following components:   Lactic Acid, Venous 5.83 (*)    All other components within normal limits  CULTURE, BLOOD (ROUTINE X 2)  CULTURE, BLOOD (ROUTINE X 2)  URINE CULTURE  PATHOLOGIST SMEAR REVIEW  I-STAT CG4 LACTIC ACID, ED    EKG EKG Interpretation  Date/Time:  Tuesday February 15 2018 12:35:02 EST Ventricular Rate:  104 PR Interval:    QRS Duration: 84 QT Interval:  347 QTC Calculation: 457 R Axis:   76 Text Interpretation:  Sinus tachycardia Borderline repolarization abnormality Baseline wander in lead(s) V1 tachycardia new from previous borderline ST depression V4-V6 Confirmed by Theotis Burrow [phone]) on 02/15/2018 12:39:14 PM   Radiology Ct Head Wo Contrast  Result Date: 02/15/2018 CLINICAL DATA:  Altered mental status.  Apnea.  Known CNS lymphoma. EXAM: CT HEAD WITHOUT CONTRAST TECHNIQUE: Contiguous axial images were obtained from the base of the skull through the vertex without intravenous contrast. COMPARISON:  MRI head 09/05/2016.  CT head 08/27/2016 FINDINGS: Brain: Left occipital craniotomy for resection of posterior fossa tumor on the left. Hypodensity in the left middle cerebellar peduncle extending into the inferior cerebellum compatible with postop resection site. Hyperdense tissue lining the cavity could represent residual tumor. Overall the appearance appears similar to the prior MRI of 09/05/2016 Ventricle size normal.  No acute infarct, hemorrhage. Vascular: Negative for hyperdense vessel Skull: Left occipital craniotomy.  No acute abnormality. Sinuses/Orbits: Paranasal sinuses clear. Bilateral cataract surgery. Other: None IMPRESSION: No acute abnormality Postsurgical changes of tumor resection in the left posterior fossa. MRI with contrast would be necessary to  evaluate for residual or recurrent tumor. Electronically Signed   By: Franchot Gallo M.D.   On: 02/15/2018 13:56   Dg Chest Port 1 View  Result Date: 02/15/2018 CLINICAL DATA:  Multiple syncopal episodes. EXAM: PORTABLE CHEST 1 VIEW COMPARISON:  Chest x-ray 09/04/2016 FINDINGS: The cardiac silhouette, mediastinal and hilar contours are within normal limits and stable. Coronary artery stents are noted. Right IJ Port-A-Cath appears to be in good position in the distal SVC. The end of the catheter appears somewhat irregular but this may be artifact from something on the patient's chest. A dedicated two view chest x-ray may be helpful for further evaluation. The lungs are clear of an acute process. No infiltrates, edema or effusions. No pneumothorax. The bony thorax is intact. IMPRESSION: 1. No acute cardiopulmonary findings. 2. Somewhat irregular appearance of the tip of the right IJ Port-A-Cath, possibly due to artifact. A repeat two-view chest x-ray when able may be helpful. Electronically Signed   By: Marijo Sanes M.D.   On: 02/15/2018 12:38    Procedures .Critical Care Performed by: Sharlett Iles, MD Authorized by: Sharlett Iles, MD   Critical care provider statement:    Critical care time (minutes):  8   Critical care time was exclusive of:  Separately billable procedures and treating other patients   Critical care was necessary to treat or prevent imminent or life-threatening deterioration of the following conditions:  Sepsis and shock   Critical care was time spent personally by me on the following activities:  Development of treatment plan with patient or surrogate, discussions with consultants, evaluation of patient's response to treatment, examination of patient, obtaining history from patient or surrogate, ordering and performing treatments and interventions, ordering and review of laboratory studies, ordering and review of radiographic studies, re-evaluation of patient's  condition and review of old charts   (including critical care time)  Medications Ordered in ED Medications  metroNIDAZOLE (FLAGYL) IVPB 500 mg (0 mg Intravenous Stopped 02/15/18 1357)  norepinephrine (LEVOPHED) 4mg  in D5W 211mL premix infusion (8 mcg/min Intravenous Rate/Dose Change 02/15/18 1545)  ceFEPIme (MAXIPIME) 2 g in sodium chloride 0.9 % 100 mL IVPB (0 g Intravenous Stopped 02/15/18 1323)  lactated ringers bolus 1,000 mL (0 mLs Intravenous Stopped 02/15/18 1412)  vancomycin (VANCOCIN) 1,250 mg in sodium chloride 0.9 % 250 mL IVPB (0 mg Intravenous Stopped 02/15/18 1446)  lactated ringers bolus 1,000 mL (0 mLs Intravenous Stopped 02/15/18 1451)  acetaminophen (TYLENOL) suppository 650 mg (650 mg Rectal Given 02/15/18 1320)  lactated ringers bolus 1,000 mL (0 mLs Intravenous Stopped 02/15/18 1548)     Initial Impression / Assessment and Plan / ED Course  I have reviewed the triage vital signs and the nursing notes.  Pertinent labs & imaging results that were available during my care of the patient were reviewed by me and considered in my medical decision making (see chart for details).    PT was alert and w/ regular respiratory rate on arrival. Hypotensive. Initiated a code sepsis based on AMS and hypotension, gave 2L IVF and Vanc/cefepime. Initial lactate 5.83. CXR without infiltrate. UA c/w infection. Cr 1.24 which is elevated for her, WBC 2.0 c/w chemotherapy. Head CT stable. CXR negative acute. I suspect septic shock from urinary source.  There is received a total of 3 L of IV fluids without any improvement in her hypotension.  I have initiated Levophed and titrated up.  Contacted critical care, they will come to evaluate the patient.  Discussed options of admission here for antibiotics versus transfer to [facility] and family feels comfortable admitting here given evidence of infection, stable head CT, and their proximity to this hospital.  Patient will be admitted for further  care.  Final Clinical Impressions(s) / ED Diagnoses   Final diagnoses:  Septic shock ([place])  Urinary tract infection without hematuria, site unspecified    ED Discharge Orders    None       Grayce Budden, Wenda [place], MD 02/15/18 [phone]

## 2018-02-15 NOTE — ED Triage Notes (Signed)
Pt presents via [facility] for evaluation of multiple syncopal episodes with period of apnea. Pt had witnessed event with EMS requiring assisted ventilation, no seizure activity. Pt with hx of cancer, brain tumor resulting in baseline facial droop, weakness and slurred speech.

## 2018-02-16 ENCOUNTER — Inpatient Hospital Stay (HOSPITAL_COMMUNITY): Payer: Medicare Other

## 2018-02-16 DIAGNOSIS — G9340 Encephalopathy, unspecified: Secondary | ICD-10-CM

## 2018-02-16 DIAGNOSIS — Z7189 Other specified counseling: Secondary | ICD-10-CM

## 2018-02-16 DIAGNOSIS — R6521 Severe sepsis with septic shock: Secondary | ICD-10-CM

## 2018-02-16 LAB — BASIC METABOLIC PANEL
Anion gap: 11 (ref 5–15)
BUN: 18 mg/dL (ref 8–23)
CO2: 21 mmol/L — AB (ref 22–32)
Calcium: 7.7 mg/dL — ABNORMAL LOW (ref 8.9–10.3)
Chloride: 105 mmol/L (ref 98–111)
Creatinine, Ser: 0.84 mg/dL (ref 0.44–1.00)
GFR calc Af Amer: >60 mL/min (ref >60)
GFR calc non Af Amer: >60 mL/min (ref >60)
GLUCOSE: 96 mg/dL (ref 70–99)
Potassium: 3.2 mmol/L — ABNORMAL LOW (ref 3.5–5.1)
Sodium: 137 mmol/L (ref 135–145)

## 2018-02-16 LAB — GLUCOSE, CAPILLARY
Glucose-Capillary: 72 mg/dL (ref 70–99)
Glucose-Capillary: 64 mg/dL — ABNORMAL LOW (ref 70–99)
Glucose-Capillary: 98 mg/dL (ref 70–99)
Glucose-Capillary: 70 mg/dL (ref 70–99)
Glucose-Capillary: 80 mg/dL (ref 70–99)
Glucose-Capillary: 110 mg/dL — ABNORMAL HIGH (ref 70–99)
Glucose-Capillary: 112 mg/dL — ABNORMAL HIGH (ref 70–99)

## 2018-02-16 LAB — CBC
HEMATOCRIT: 30.5 % — AB (ref 36.0–46.0)
Hemoglobin: 9.6 g/dL — ABNORMAL LOW (ref 12.0–15.0)
MCH: 26.5 pg (ref 26.0–34.0)
MCHC: 31.5 g/dL (ref 30.0–36.0)
MCV: 84.3 fL (ref 80.0–100.0)
Platelets: 194 10*3/uL (ref 150–400)
RBC: 3.62 MIL/uL — ABNORMAL LOW (ref 3.87–5.11)
RDW: 20 % — ABNORMAL HIGH (ref 11.5–15.5)
WBC: 6.9 10*3/uL (ref 4.0–10.5)
nRBC: 0 % (ref 0.0–0.2)

## 2018-02-16 LAB — MAGNESIUM: Magnesium: 1.3 mg/dL — ABNORMAL LOW (ref 1.7–2.4)

## 2018-02-16 LAB — PATHOLOGIST SMEAR REVIEW

## 2018-02-16 LAB — PHOSPHORUS: Phosphorus: 3.2 mg/dL (ref 2.5–4.6)

## 2018-02-16 LAB — CORTISOL: Cortisol, Plasma: 19.2 ug/dL

## 2018-02-16 MED ORDER — RIVAROXABAN 10 MG PO TABS
10.0000 mg | ORAL_TABLET | Freq: Every day | ORAL | Status: DC
  Administered 2018-02-16 – 2018-02-23 (×8): 10 mg via ORAL
  Filled 2018-02-16 (×10): qty 1

## 2018-02-16 MED ORDER — POTASSIUM CHLORIDE 10 MEQ/50ML IV SOLN
10.0000 meq | INTRAVENOUS | Status: AC
  Administered 2018-02-16 (×4): 10 meq via INTRAVENOUS
  Filled 2018-02-16 (×4): qty 50

## 2018-02-16 MED ORDER — DEXTROSE 50 % IV SOLN
INTRAVENOUS | Status: AC
  Filled 2018-02-16: qty 50

## 2018-02-16 MED ORDER — MAGNESIUM SULFATE 2 GM/50ML IV SOLN
2.0000 g | Freq: Once | INTRAVENOUS | Status: AC
  Administered 2018-02-16: 2 g via INTRAVENOUS
  Filled 2018-02-16: qty 50

## 2018-02-16 MED ORDER — CALCIUM GLUCONATE-NACL 1-0.675 GM/50ML-% IV SOLN
1.0000 g | Freq: Once | INTRAVENOUS | Status: AC
  Administered 2018-02-16: 1000 mg via INTRAVENOUS
  Filled 2018-02-16: qty 50

## 2018-02-16 MED ORDER — HYDROCORTISONE NA SUCCINATE PF 100 MG IJ SOLR
50.0000 mg | Freq: Four times a day (QID) | INTRAMUSCULAR | Status: DC
  Administered 2018-02-16 – 2018-02-18 (×8): 50 mg via INTRAVENOUS
  Filled 2018-02-16 (×8): qty 2

## 2018-02-16 MED ORDER — DEXTROSE 50 % IV SOLN: 12.5000 g | INTRAVENOUS | Status: AC

## 2018-02-16 MED ORDER — DEXTROSE 50 % IV SOLN
25.0000 mL | Freq: Once | INTRAVENOUS | Status: AC
  Administered 2018-02-16: 25 mL via INTRAVENOUS

## 2018-02-16 NOTE — Progress Notes (Signed)
Hypoglycemic Event  CBG: 64  Treatment: D50 25 mL (12.5 gm)  Symptoms: None  Follow-up CBG: Time:810 CBG Result: 98  Possible Reasons for Event: Inadequate meal intake  Comments/MD notified: Patient exhibits no symptoms.    [name] A

## 2018-02-16 NOTE — H&P (Signed)
NAME:  Kimberly Vaughn, MRN:  [id number], DOB:  1945-06-12, LOS: 1 ADMISSION DATE:  02/15/2018, CONSULTATION DATE:  02/15/18 REFERRING MD:  Maryruth Bun - [facility] ED, CHIEF COMPLAINT:  Septic shock.   HPI/course in hospital  72 year old woman who was in her usual state of health until this morning when she has several presyncopal episodes at home with an episode of unresponsiveness and possible shaking.   She received her last treatment for lymphoma at [facility] last Thursday for CNS lymphoma diagnosed 09/04/2016.  Treated initially with R-MTX. Continued slow progression. Started on Revlimid in 01/06/18.  Interim history/subjective:  No events overnight, no new complaints  Objective   Blood pressure (!) 143/56, pulse 89, temperature 98.3 F (36.8 C), temperature source Oral, resp. rate 20, height 5' (1.524 m), weight 61.8 kg, SpO2 98 %.        Intake/Output Summary (Last 24 hours) at 02/16/2018 0940 Last data filed at 02/16/2018 0800 Gross per 24 hour  Intake 4671.37 ml  Output 948 ml  Net 3723.37 ml   Filed Weights   02/15/18 1237 02/15/18 1730 02/16/18 0500  Weight: 63.5 kg 60.9 kg 61.8 kg   Physical Exam: General: acutely ill appearing, NAD HEENT: [place]/AT, PERRL, EOM-I and MMM Heart: RRR, Nl S1/S2 and -M/R/G Lung: CTA bilaterally Abd: Soft, NT, ND and +BS Ext: -edema and -tenderness Neuro: alert and interactive, moving all ext to command Skin: Intact  Bedside POC echo shows normal function RV, LV. + respiratory variation of IVC  Ancillary tests (personally reviewed)  CBC: Recent Labs  Lab 02/15/18 1218 02/16/18 0216  WBC 2.0* 6.9  NEUTROABS 0.8*  --   HGB 10.8* 9.6*  HCT 36.8 30.5*  MCV 88.0 84.3  PLT 209 409   Basic Metabolic Panel: Recent Labs  Lab 02/15/18 1218 02/15/18 1957 02/16/18 0216  NA 140 137 137  K 3.1* 3.1* 3.2*  CL 103 106 105  CO2 23 21* 21*  GLUCOSE 99 99 96  BUN 20 21 18   CREATININE 1.24* 1.01* 0.84  CALCIUM 8.5* 7.8* 7.7*  MG  --   --  1.3*    PHOS  --   --  3.2   GFR: Estimated Creatinine Clearance: 48.2 mL/min (by C-G formula based on SCr of 0.84 mg/dL). Recent Labs  Lab 02/15/18 1218 02/15/18 1252 02/15/18 1613 02/15/18 1707 02/15/18 1957 02/16/18 0216  WBC 2.0*  --   --   --   --  6.9  LATICACIDVEN  --  5.83* 3.64* 2.5* 2.2*  --    Liver Function Tests: Recent Labs  Lab 02/15/18 1218  AST 19  ALT 22  ALKPHOS 52  BILITOT 0.9  PROT 5.0*  ALBUMIN 3.0*   No results for input(s): LIPASE, AMYLASE in the last 168 hours. No results for input(s): AMMONIA in the last 168 hours.  ABG    Component Value Date/Time   PHART 7.365 09/04/2016 1140   PCO2ART 46.9 09/04/2016 1140   PO2ART 201.0 (H) 09/04/2016 1140   HCO3 27.1 09/04/2016 1140   TCO2 29 09/04/2016 1140   O2SAT 100.0 09/04/2016 1140    Urinalysis    Component Value Date/Time   COLORURINE AMBER (A) 02/15/2018 1350   APPEARANCEUR HAZY (A) 02/15/2018 1350   LABSPEC 1.011 02/15/2018 1350   PHURINE 8.0 02/15/2018 1350   GLUCOSEU NEGATIVE 02/15/2018 1350   HGBUR NEGATIVE 02/15/2018 1350   BILIRUBINUR NEGATIVE 02/15/2018 [place] 02/15/2018 1350   PROTEINUR 30 (A) 02/15/2018  1350   NITRITE POSITIVE (A) 02/15/2018 1350   LEUKOCYTESUR SMALL (A) 02/15/2018 1350   Assessment & Plan:  72 year old female with PMH of lymphoma who presents with neutropenic fever and adrenal insufficiency.  Discussed with PCCM-NP.  Hypotension: likely adrenal insufficiency  - Cortisol level  - Stress dose steroids  - Taper levophed as able  Sepsis: unlikely  - Continue abx for now  - PCT  - Follow cultures  Acute encephalopathy:  - Minimize sedation  Lymphoma:  - Isolation as ordered  Xarelto:  - Restart  Best practice:  Diet: NPO for now Pain/Anxiety/Delirium protocol (if indicated): prn non-narcotics VAP protocol (if indicated): n/a DVT prophylaxis: Lovenox - resume Xarelto when stable. GI prophylaxis: N/A Glucose control: SSI   Mobility: Bed rest Code Status: DNR/DNI as per patient's wishes - personally confirmed. Family Communication: updated at bedside. Disposition: ICU  The patient is critically ill with multiple organ systems failure and requires high complexity decision making for assessment and support, frequent evaluation and titration of therapies, application of advanced monitoring technologies and extensive interpretation of multiple databases.   Critical Care Time devoted to patient care services described in this note is  33  Minutes. This time reflects time of care of this signee Dr Jennet Maduro. This critical care time does not reflect procedure time, or teaching time or supervisory time of PA/NP/Med student/Med Resident etc but could involve care discussion time.  Rush Farmer, M.D. [facility] Pulmonary/Critical Care Medicine. Pager: [phone]. After hours pager: [phone].

## 2018-02-16 NOTE — Progress Notes (Signed)
[facility] Progress Note Patient Name: Micole Delehanty Chapple DOB: [birth year] MRN: [id number]   Date of Service  02/16/2018  HPI/Events of Note  Electrolytes reviewed K 3.2, Mg 1.3, Ca 7.7 Crea improving  eICU Interventions  Replete K, Mg and Ca through IV.  Pt is NPO.     Intervention Category Major Interventions: Electrolyte abnormality - evaluation and management  Elsie Lincoln 02/16/2018, 3:55 AM

## 2018-02-16 NOTE — Evaluation (Signed)
Physical Therapy Evaluation Patient Details Name: Kimberly Vaughn MRN: [id number] DOB: 30-Jan-1946 Today's Date: 02/16/2018   History of Present Illness  72 year old woman who was in her usual state of health until this morning when she has several presyncopal episodes at home with an episode of unresponsiveness and possible shaking, thought to be adrenal insufficiency. Patient with dx of lymphoma in 2018, recent treatment at [facility]. PMH includes but not limited to. A Fib, CAD, DVT, HTN, Brain Tumor s/p resection 2018, C spine surgery.     Clinical Impression  Pt admitted with above diagnosis. Pt currently with functional limitations due to the deficits listed below (see PT Problem List). PTA pt living at home with her husband, utilizing RW for gait and w/c for longer distance ambulation. Today, pt presents with strength and balance deficits, ambulating several steps to bedside chair with min A for stability. Eager to work with therapy with supportive family. Will cont to follow and progress as tolerated.  Pt will benefit from skilled PT to increase their independence and safety with mobility to allow discharge to the venue listed below.       Follow Up Recommendations CIR    Equipment Recommendations  None recommended by PT    Recommendations for Other Services Speech consult     Precautions / Restrictions Precautions Precautions: Fall Precaution Comments: Watch BP Restrictions Weight Bearing Restrictions: No      Mobility  Bed Mobility Overal bed mobility: Modified Independent                Transfers Overall transfer level: Needs assistance Equipment used: Rolling walker (2 wheeled) Transfers: Sit to/from [facility] Sit to Stand: Min assist Stand pivot transfers: Min assist       General transfer comment: Min A to power up and provide intermittent stability during 3 steps and pivot into chair. pt reports feeling dizzy, resolved quickly with sitting  in chair.   Ambulation/Gait             General Gait Details: defrerred due to dizziness   Stairs            Wheelchair Mobility    Modified Rankin (Stroke Patients Only)       Balance                                             Pertinent Vitals/Pain Pain Assessment: No/denies pain    Home Living Family/patient expects to be discharged to:: Private residence Living Arrangements: Spouse/significant other Available Help at Discharge: Available 24 hours/day Type of Home: House Home Access: Ramped entrance     Home Layout: One level Home Equipment: Environmental consultant - 2 wheels;Shower seat;Wheelchair - manual      Prior Function Level of Independence: Independent with assistive device(s)         Comments: pt living with husband, RW for household ambulation. w/c for community. mod I with ADLS     Hand Dominance        Extremity/Trunk Assessment   Upper Extremity Assessment Upper Extremity Assessment: Generalized weakness;Defer to OT evaluation    Lower Extremity Assessment Lower Extremity Assessment: Generalized weakness(LLE<RLE since tumor )       Communication   Communication: No difficulties  Cognition Arousal/Alertness: Awake/alert Behavior During Therapy: WFL for tasks assessed/performed Overall Cognitive Status: Within Functional Limits for tasks assessed  General Comments      Exercises General Exercises - Lower Extremity Ankle Circles/Pumps: 10 reps Gluteal Sets: 10 reps Short Arc Quad: 10 reps Long Arc Quad: 10 reps Heel Slides: 10 reps Hip ABduction/ADduction: 10 reps Straight Leg Raises: 10 reps   Assessment/Plan    PT Assessment Patient needs continued PT services  PT Problem List Decreased strength       PT Treatment Interventions DME instruction;Gait training;Functional mobility training;Therapeutic activities;Therapeutic exercise;Balance training     PT Goals (Current goals can be found in the Care Plan section)  Acute Rehab PT Goals Patient Stated Goal: return home when able PT Goal Formulation: With patient/family Time For Goal Achievement: 03/02/18 Potential to Achieve Goals: Fair    Frequency Min 3X/week   Barriers to discharge        Co-evaluation               AM-PAC PT "6 Clicks" Mobility  Outcome Measure Help needed turning from your back to your side while in a flat bed without using bedrails?: None Help needed moving from lying on your back to sitting on the side of a flat bed without using bedrails?: A Little Help needed moving to and from a bed to a chair (including a wheelchair)?: A Little Help needed standing up from a chair using your arms (e.g., wheelchair or bedside chair)?: A Little Help needed to walk in hospital room?: A Lot Help needed climbing 3-5 steps with a railing? : A Lot 6 Click Score: 17    End of Session Equipment Utilized During Treatment: Gait belt Activity Tolerance: Patient tolerated treatment well Patient left: in chair;with call bell/phone within reach Nurse Communication: Mobility status PT Visit Diagnosis: Unsteadiness on feet (R26.81)    Time: 6578-4696 PT Time Calculation (min) (ACUTE ONLY): 25 min   Charges:   PT Evaluation $PT Eval Moderate Complexity: 1 Mod PT Treatments $Therapeutic Activity: 8-22 mins        Reinaldo Berber, PT, DPT Acute Rehabilitation Services Pager: [phone] Office: [place] 02/16/2018, 5:51 PM

## 2018-02-17 ENCOUNTER — Other Ambulatory Visit: Payer: Self-pay

## 2018-02-17 DIAGNOSIS — N390 Urinary tract infection, site not specified: Secondary | ICD-10-CM

## 2018-02-17 LAB — CBC
HCT: 27.3 % — ABNORMAL LOW (ref 36.0–46.0)
Hemoglobin: 8.4 g/dL — ABNORMAL LOW (ref 12.0–15.0)
MCH: 26.1 pg (ref 26.0–34.0)
MCHC: 30.8 g/dL (ref 30.0–36.0)
MCV: 84.8 fL (ref 80.0–100.0)
Platelets: 170 10*3/uL (ref 150–400)
RBC: 3.22 MIL/uL — ABNORMAL LOW (ref 3.87–5.11)
RDW: 20 % — AB (ref 11.5–15.5)
WBC: 8.7 10*3/uL (ref 4.0–10.5)
nRBC: 0 % (ref 0.0–0.2)

## 2018-02-17 LAB — BASIC METABOLIC PANEL
Anion gap: 9 (ref 5–15)
BUN: 15 mg/dL (ref 8–23)
CO2: 25 mmol/L (ref 22–32)
Calcium: 7.8 mg/dL — ABNORMAL LOW (ref 8.9–10.3)
Chloride: 105 mmol/L (ref 98–111)
Creatinine, Ser: 0.72 mg/dL (ref 0.44–1.00)
GFR calc non Af Amer: >60 mL/min (ref >60)
Glucose, Bld: 113 mg/dL — ABNORMAL HIGH (ref 70–99)
Potassium: 3 mmol/L — ABNORMAL LOW (ref 3.5–5.1)
Sodium: 139 mmol/L (ref 135–145)

## 2018-02-17 LAB — URINE CULTURE: Culture: >=100000 — AB

## 2018-02-17 LAB — GLUCOSE, CAPILLARY
GLUCOSE-CAPILLARY: 107 mg/dL — AB (ref 70–99)
GLUCOSE-CAPILLARY: 146 mg/dL — AB (ref 70–99)
Glucose-Capillary: 112 mg/dL — ABNORMAL HIGH (ref 70–99)
Glucose-Capillary: 124 mg/dL — ABNORMAL HIGH (ref 70–99)
Glucose-Capillary: 143 mg/dL — ABNORMAL HIGH (ref 70–99)

## 2018-02-17 LAB — PHOSPHORUS: Phosphorus: 2.3 mg/dL — ABNORMAL LOW (ref 2.5–4.6)

## 2018-02-17 LAB — MAGNESIUM: Magnesium: 2.1 mg/dL (ref 1.7–2.4)

## 2018-02-17 LAB — MRSA PCR SCREENING: MRSA by PCR: NEGATIVE

## 2018-02-17 MED ORDER — EYE WASH OPHTH SOLN
1.0000 [drp] | OPHTHALMIC | Status: DC | PRN
  Filled 2018-02-17: qty 118

## 2018-02-17 MED ORDER — POTASSIUM CHLORIDE CRYS ER 20 MEQ PO TBCR
40.0000 meq | EXTENDED_RELEASE_TABLET | ORAL | Status: AC
  Administered 2018-02-17 (×2): 40 meq via ORAL
  Filled 2018-02-17 (×3): qty 2

## 2018-02-17 MED ORDER — SODIUM CHLORIDE 0.9 % IV SOLN
2.0000 g | INTRAVENOUS | Status: DC
  Administered 2018-02-17 – 2018-02-18 (×2): 2 g via INTRAVENOUS
  Filled 2018-02-17 (×2): qty 20

## 2018-02-17 NOTE — Progress Notes (Signed)
Rehab Admissions Coordinator Note:  Patient was screened by Cleatrice Burke for appropriateness for an Inpatient Acute Rehab Consult per PT and OT recommendations.  At this time, we are recommending Inpatient Rehab consult. I will contact Dr. Vaughan Browner for order.  Danne Baxter, RN, MSN Rehab Admissions Coordinator [phone] 02/17/2018 2:18 PM

## 2018-02-17 NOTE — Progress Notes (Addendum)
   NAME:  Kimberly Vaughn, MRN:  [id number], DOB:  1945-03-27, LOS: 2 ADMISSION DATE:  02/15/2018, CONSULTATION DATE:  02/15/18 REFERRING MD:  Maryruth Bun - [facility] ED, CHIEF COMPLAINT:  Septic shock.   HPI/course in hospital  72 year old with history of lymphoma on chemotherapy admitted with septic shock secondary to Proteus UTI.  She received her last treatment for lymphoma at [facility] last Thursday for CNS lymphoma diagnosed 09/04/2016.  Treated initially with R-MTX. Continued slow progression. Started on Revlimid in 01/06/18.  12/7-admit to ICU, pressors 12/19-weaned off pressors.  Interim history/subjective:  No new events overnight. Off pressors today.  Objective   Blood pressure 139/68, pulse 72, temperature 98.2 F (36.8 C), temperature source Oral, resp. rate 20, height 5' (1.524 m), weight 60.9 kg, SpO2 96 %.        Intake/Output Summary (Last 24 hours) at 02/17/2018 0931 Last data filed at 02/17/2018 0900 Gross per 24 hour  Intake 1728.61 ml  Output 2355 ml  Net -626.39 ml   Filed Weights   02/15/18 1730 02/16/18 0500 02/17/18 0500  Weight: 60.9 kg 61.8 kg 60.9 kg   Physical Exam: Blood pressure 139/68, pulse 72, temperature 98.2 F (36.8 C), temperature source Oral, resp. rate 20, height 5' (1.524 m), weight 60.9 kg, SpO2 96 %. Gen:      No acute distress HEENT:  EOMI, sclera anicteric Neck:     No masses; no thyromegaly Lungs:    Clear to auscultation bilaterally; normal respiratory effort CV:         Regular rate and rhythm; no murmurs Abd:      + bowel sounds; soft, non-tender; no palpable masses, no distension Ext:    No edema; adequate peripheral perfusion Skin:      Warm and dry; no rash Neuro: alert and oriented x 3. Left facial droop Psych: normal mood and affect  Assessment & Plan:  72 year old female with PMH of lymphoma who presents with septic shock, neutropenic fever and adrenal insufficiency.    Urosepsis, Proteus infection Neuro antibiotics to  ceftriaxone  Shock Adrenal insufficiency Continue stress dose steroids. Can start tapering down blood pressure continues to be stable. She is on chronic decadron as outpatient.   Lymphoma, neutropenia Neutropenic precautions  DVT Resume xarelto  Best practice:  Diet: Advance diet Pain/Anxiety/Delirium protocol (if indicated): prn non-narcotics VAP protocol (if indicated): n/a DVT prophylaxis: Xarelto GI prophylaxis: N/A Glucose control: SSI  Mobility: Bed rest Code Status: DNR/DNI as per patient's wishes - personally confirmed. Family Communication: updated at bedside. Disposition: Transfer from ICU  Marshell Garfinkel MD [facility] 02/17/2018, 9:43 AM

## 2018-02-17 NOTE — Progress Notes (Signed)
Pharmacy Antibiotic Note  Kimberly Vaughn is a 72 y.o. female admitted on 02/15/2018 with sepsis secondary to a urinary source.  Cultures came back from the urine with Proteus. Pharmacy has been consulted for ceftriaxone dosing. Pt with tmax 98.7 and low WBC of 8.7.   Plan: Ceftriaxone 2 gms IV q24hr F/u C&S and clinical status  Height: 5' (152.4 cm) Weight: 134 lb 4.2 oz (60.9 kg) IBW/kg (Calculated) : 45.5  Temp (24hrs), Avg:98.3 F (36.8 C), Min:98.1 F (36.7 C), Max:98.7 F (37.1 C)  Recent Labs  Lab 02/15/18 1218 02/15/18 1252 02/15/18 1613 02/15/18 1707 02/15/18 1957 02/16/18 0216 02/17/18 0515  WBC 2.0*  --   --   --   --  6.9 8.7  CREATININE 1.24*  --   --   --  1.01* 0.84 0.72  LATICACIDVEN  --  5.83* 3.64* 2.5* 2.2*  --   --     Estimated Creatinine Clearance: 50.4 mL/min (by C-G formula based on SCr of 0.72 mg/dL).    No Active Allergies  Antimicrobials this admission: Vanc 12/17>>12/19 Cefepime 12/17>>12/19 Flagyl 12/17>>12/19 Ceftriaxone 12/19>>  Dose adjustments this admission: N/A  Microbiology results: Urine Cx - proteus mirabilis  Thank you for allowing pharmacy to be a part of this patient's care.  Alanda Slim, PharmD, [facility] Clinical Pharmacist Please see AMION for all Pharmacists' Contact Phone Numbers 02/17/2018, 9:47 AM

## 2018-02-17 NOTE — Evaluation (Signed)
Occupational Therapy Evaluation Patient Details Name: Kimberly Vaughn MRN: [id number] DOB: [birth year] Today's Date: 02/17/2018    History of Present Illness 72 year old woman who was in her usual state of health until this morning when she has several presyncopal episodes at home with an episode of unresponsiveness and possible shaking, thought to be adrenal insufficiency. Patient with dx of lymphoma in 2018, recent treatment at [facility]. PMH includes but not limited to. A Fib, CAD, DVT, HTN, Brain Tumor s/p resection 2018, C spine surgery.    Clinical Impression   Pt was ambulatory household distances with a RW and modified independent in self care and moderate meal prep. Pt presents with generalized weakness and decreased standing balance. She reports she has "tell her L leg to bend" when ambulating. Pt requires min assist for mobility and set up to moderate assistance for ADL. Recommending CIR for intensive rehab prior to returning home. Pt is highly motivated.     Follow Up Recommendations  CIR    Equipment Recommendations  None recommended by OT    Recommendations for Other Services       Precautions / Restrictions Precautions Precautions: Fall Restrictions Weight Bearing Restrictions: No      Mobility Bed Mobility        General bed mobility comments: assist to raise trunk, increased time  Transfers Overall transfer level: Needs assistance Equipment used: Rolling walker (2 wheeled) Transfers: Sit to/from [facility] Sit to Stand: Min assist Stand pivot transfers: Min assist       General transfer comment: steadying assist    Balance Overall balance assessment: Needs assistance   Sitting balance-Leahy Scale: Fair       Standing balance-Leahy Scale: Poor                             ADL either performed or assessed with clinical judgement   ADL Overall ADL's : Needs assistance/impaired Eating/Feeding: Set  up;Sitting Eating/Feeding Details (indicate cue type and reason): assist to open containers Grooming: Set up;Sitting   Upper Body Bathing: Minimal assistance;Sitting   Lower Body Bathing: Moderate assistance;Sit to/from stand   Upper Body Dressing : Minimal assistance;Sitting   Lower Body Dressing: Moderate assistance;Sit to/from stand   Toilet Transfer: Minimal assistance;Stand-pivot;BSC;RW   Toileting- Clothing Manipulation and Hygiene: Moderate assistance;Sit to/from stand               Vision Baseline Vision/History: Wears glasses Wears Glasses: At all times Patient Visual Report: No change from baseline       Perception     Praxis      Pertinent Vitals/Pain Pain Assessment: No/denies pain     Hand Dominance Right   Extremity/Trunk Assessment Upper Extremity Assessment Upper Extremity Assessment: RUE deficits/detail;LUE deficits/detail RUE Coordination: decreased fine motor LUE Coordination: decreased fine motor   Lower Extremity Assessment Lower Extremity Assessment: Defer to PT evaluation   Cervical / Trunk Assessment Cervical / Trunk Assessment: Normal   Communication Communication Communication: No difficulties   Cognition Arousal/Alertness: Awake/alert Behavior During Therapy: WFL for tasks assessed/performed Overall Cognitive Status: Within Functional Limits for tasks assessed                                     General Comments       Exercises     Shoulder Instructions      Home Living  Family/patient expects to be discharged to:: Private residence Living Arrangements: Spouse/significant other Available Help at Discharge: Available 24 hours/day Type of Home: House Home Access: [place]: One level     Bathroom Shower/Tub: Teacher, early years/pre: [place]: Environmental consultant - 2 wheels;Shower seat;Wheelchair - manual;Hospital bed          Prior  Functioning/Environment Level of Independence: Independent with assistive device(s)        Comments: pt living with husband, RW for household ambulation. w/c for community. mod I with ADLS        OT Problem List: Decreased strength;Impaired balance (sitting and/or standing);Decreased coordination      OT Treatment/Interventions: Self-care/ADL training;DME and/or AE instruction;Patient/family education;Balance training;Therapeutic activities    OT Goals(Current goals can be found in the care plan section) Acute Rehab OT Goals Patient Stated Goal: return home when able OT Goal Formulation: With patient Time For Goal Achievement: 03/03/18 Potential to Achieve Goals: Good ADL Goals Pt Will Perform Grooming: with supervision;standing Pt Will Perform Upper Body Dressing: with set-up;sitting Pt Will Perform Lower Body Dressing: with supervision;sit to/from stand Pt Will Transfer to Toilet: with supervision;ambulating;regular height toilet Pt Will Perform Toileting - Clothing Manipulation and hygiene: with supervision;sit to/from stand  OT Frequency: Min 2X/week   Barriers to D/C:            Co-evaluation              AM-PAC OT "6 Clicks" Daily Activity     Outcome Measure Help from another person eating meals?: A Little Help from another person taking care of personal grooming?: A Little Help from another person toileting, which includes using toliet, bedpan, or urinal?: A Lot Help from another person bathing (including washing, rinsing, drying)?: A Lot Help from another person to put on and taking off regular upper body clothing?: A Little Help from another person to put on and taking off regular lower body clothing?: A Lot 6 Click Score: 15   End of Session Equipment Utilized During Treatment: Gait belt;Rolling walker Nurse Communication: Mobility status  Activity Tolerance: Patient tolerated treatment well Patient left: in chair;with call bell/phone within reach;with  nursing/sitter in room  OT Visit Diagnosis: Unsteadiness on feet (R26.81);Other abnormalities of gait and mobility (R26.89)                Time: 0092-3300 OT Time Calculation (min): 19 min Charges:  OT General Charges $OT Visit: 1 Visit OT Evaluation $OT Eval Moderate Complexity: 1 Mod  Nestor Lewandowsky, OTR/L Acute Rehabilitation Services Pager: [phone] Office: [phone]  Kimberly Vaughn 02/17/2018, 12:17 PM

## 2018-02-17 NOTE — Clinical Social Work Note (Signed)
Clinical Social Work Assessment  Patient Details  Name: Kimberly Vaughn MRN: [id number] Date of Birth: [birth year]  Date of referral:  02/17/18               Reason for consult:  Facility Placement, Discharge Planning                Permission sought to share information with:  Family Supports Permission granted to share information::  Yes, Verbal Permission Granted  Name::     Venetia Night   Agency::  family  Relationship::  daughter  Sport and exercise psychologist Information:  Jeani Hawking [phone]  Housing/Transportation Living arrangements for the past 2 months:  Single Family Home(with spouse.) Source of Information:  Patient, Adult Children Patient Interpreter Needed:  None Criminal Activity/Legal Involvement Pertinent to Current Situation/Hospitalization:  No - Comment as needed Significant Relationships:  Adult Children, Spouse Lives with:  Spouse Do you feel safe going back to the place where you live?  Yes Need for family participation in patient care:  Yes (Comment)  Care giving concerns:  CSW consulted for SNF placement as a back up to CIR.    Social Worker assessment / plan:  CSW spoke with pt and daughter Jeani Hawking at bedside. CSW informed that pt is from home with spouse. Daughter reports that pt's spouse is unable to care for pt during this time. CSW spoke with pt and daughter about PT suggesting that pt go to CIR for further rehab needs. Pt and daughter both expressed that pt has been to CIR in the past and loved it. CSW suggested that it pt is unable to get into CIR, then pt may want to consider another facility. Pt and daughter both expressed a desire for [facility], and [facility] if possible. CSW advised pt and daughter that CSW would send information to these facilities as needed.   Employment status:  Retired Nurse, adult PT Recommendations:  [place] / Referral to community resources:  [place]  Patient/Family's Response to care: Pt appeared to be understanding an agreeable to plan of care at this time.   Patient/Family's Understanding of and Emotional Response to Diagnosis, Current Treatment, and Prognosis:  No further questions or concerns have been presented to CSW at this time.   Emotional Assessment Appearance:  Appears stated age Attitude/Demeanor/Rapport:  Engaged Affect (typically observed):  Adaptable, Appropriate, Pleasant Orientation:  Oriented to  Time, Oriented to Self, Oriented to Place, Oriented to Situation Alcohol / Substance use:  Not Applicable Psych involvement (Current and /or in the community):  No (Comment)  Discharge Needs  Concerns to be addressed:  Care Coordination Readmission within the last 30 days:  No Current discharge risk:  Dependent with Mobility Barriers to Discharge:  Continued Medical Work up   [employer], [place] 02/17/2018, 1:52 PM

## 2018-02-17 NOTE — Progress Notes (Signed)
Physical Therapy Treatment Patient Details Name: Kimberly Vaughn MRN: [id number] DOB: 12/07/1945 Today's Date: 02/17/2018    History of Present Illness 72 year old woman who was in her usual state of health until this morning when she has several presyncopal episodes at home with an episode of unresponsiveness and possible shaking, thought to be adrenal insufficiency. Patient with dx of lymphoma in 2018, recent treatment at [facility]. PMH includes but not limited to. A Fib, CAD, DVT, HTN, Brain Tumor s/p resection 2018, C spine surgery.     PT Comments    Patient progressing very well with therapy today, able to ambulate 45' with close chair follow and hands on assistance. Daughter present delighted with progress, pt very motivated to keep getting stronger.   Follow Up Recommendations  CIR     Equipment Recommendations  None recommended by PT    Recommendations for Other Services Speech consult     Precautions / Restrictions Precautions Precautions: Fall Restrictions Weight Bearing Restrictions: No    Mobility  Bed Mobility               General bed mobility comments: OOB in chair at entry  Transfers Overall transfer level: Needs assistance Equipment used: Rolling walker (2 wheeled) Transfers: Sit to/from Stand Sit to Stand: Min assist         General transfer comment: min A to power up and provide stability  Ambulation/Gait Ambulation/Gait assistance: Min assist Gait Distance (Feet): 45 Feet Assistive device: Rolling walker (2 wheeled) Gait Pattern/deviations: Step-to pattern;Step-through pattern Gait velocity: decreases   General Gait Details: Pt ambulating with close chair follow and min A for stability. L knee hyperextension noted, gait mechanics worsened towards end of visit with fatigued. Good proximity to RW noted   Stairs             Wheelchair Mobility    Modified Rankin (Stroke Patients Only)       Balance Overall balance assessment:  Needs assistance   Sitting balance-Leahy Scale: Fair       Standing balance-Leahy Scale: Poor                              Cognition Arousal/Alertness: Awake/alert Behavior During Therapy: WFL for tasks assessed/performed Overall Cognitive Status: Within Functional Limits for tasks assessed                                        Exercises      General Comments        Pertinent Vitals/Pain Pain Assessment: No/denies pain    Home Living                      Prior Function            PT Goals (current goals can now be found in the care plan section) Acute Rehab PT Goals Patient Stated Goal: return home when able PT Goal Formulation: With patient/family Time For Goal Achievement: 03/02/18 Potential to Achieve Goals: Fair Progress towards PT goals: Progressing toward goals    Frequency    Min 3X/week      PT Plan      Co-evaluation              AM-PAC PT "6 Clicks" Mobility   Outcome Measure  Help needed turning from your back to  your side while in a flat bed without using bedrails?: None Help needed moving from lying on your back to sitting on the side of a flat bed without using bedrails?: A Little Help needed moving to and from a bed to a chair (including a wheelchair)?: A Little Help needed standing up from a chair using your arms (e.g., wheelchair or bedside chair)?: A Little Help needed to walk in hospital room?: A Lot Help needed climbing 3-5 steps with a railing? : A Lot 6 Click Score: 17    End of Session Equipment Utilized During Treatment: Gait belt Activity Tolerance: Patient tolerated treatment well Patient left: in chair;with call bell/phone within reach Nurse Communication: Mobility status PT Visit Diagnosis: Unsteadiness on feet (R26.81)     Time: 0300-9233 PT Time Calculation (min) (ACUTE ONLY): 24 min  Charges:  $Gait Training: 23-37 mins                    Reinaldo Berber, PT,  DPT Acute Rehabilitation Services Pager: [phone] Office: [phone]     Reinaldo Berber 02/17/2018, 4:49 PM

## 2018-02-17 NOTE — Progress Notes (Signed)
CSW acknowledges consult for clarity on SNF placement. CSW and RN spoke with pt at bedside. CSW was advised that pt has been at [facility] in the past and ddi not like it at this facility. CSW advised pot that from PT evaluations it appeared that pt may be appropriate for CIR placement, CSW did explain to pt that if CIR is not an option then CSW usually does SNF placement as a back up. CSW was advised by RN that CSW should speak with pt's daughter as daughter is usually the one who makes these decisons for pt. CSW to get call from RN once daughter as arrived.      Virgie Dad Krithi Bray, MSW, [facility] Clinical Social Worker [phone]

## 2018-02-18 DIAGNOSIS — Z87898 Personal history of other specified conditions: Secondary | ICD-10-CM

## 2018-02-18 DIAGNOSIS — I2510 Atherosclerotic heart disease of native coronary artery without angina pectoris: Secondary | ICD-10-CM

## 2018-02-18 DIAGNOSIS — N12 Tubulo-interstitial nephritis, not specified as acute or chronic: Secondary | ICD-10-CM

## 2018-02-18 DIAGNOSIS — I10 Essential (primary) hypertension: Secondary | ICD-10-CM

## 2018-02-18 DIAGNOSIS — R5081 Fever presenting with conditions classified elsewhere: Secondary | ICD-10-CM

## 2018-02-18 DIAGNOSIS — D709 Neutropenia, unspecified: Secondary | ICD-10-CM

## 2018-02-18 DIAGNOSIS — I480 Paroxysmal atrial fibrillation: Secondary | ICD-10-CM

## 2018-02-18 LAB — TROPONIN I: Troponin I: 0.75 ng/mL (ref <0.03)

## 2018-02-18 LAB — GLUCOSE, CAPILLARY
GLUCOSE-CAPILLARY: 95 mg/dL (ref 70–99)
Glucose-Capillary: 105 mg/dL — ABNORMAL HIGH (ref 70–99)
Glucose-Capillary: 110 mg/dL — ABNORMAL HIGH (ref 70–99)
Glucose-Capillary: 123 mg/dL — ABNORMAL HIGH (ref 70–99)
Glucose-Capillary: 130 mg/dL — ABNORMAL HIGH (ref 70–99)
Glucose-Capillary: 89 mg/dL (ref 70–99)

## 2018-02-18 MED ORDER — VITAMIN B-12 1000 MCG PO TABS
5000.0000 ug | ORAL_TABLET | Freq: Every day | ORAL | Status: DC
  Administered 2018-02-18 – 2018-02-24 (×7): 5000 ug via ORAL
  Filled 2018-02-18 (×7): qty 5

## 2018-02-18 MED ORDER — ATORVASTATIN CALCIUM 80 MG PO TABS
80.0000 mg | ORAL_TABLET | Freq: Every day | ORAL | Status: DC
  Administered 2018-02-18 – 2018-02-23 (×6): 80 mg via ORAL
  Filled 2018-02-18 (×6): qty 1

## 2018-02-18 MED ORDER — HYDROCORTISONE NA SUCCINATE PF 100 MG IJ SOLR
25.0000 mg | Freq: Three times a day (TID) | INTRAMUSCULAR | Status: DC
  Administered 2018-02-18 – 2018-02-19 (×3): 25 mg via INTRAVENOUS
  Filled 2018-02-18 (×3): qty 2

## 2018-02-18 MED ORDER — ALUM & MAG HYDROXIDE-SIMETH 200-200-20 MG/5ML PO SUSP
30.0000 mL | Freq: Once | ORAL | Status: AC
  Administered 2018-02-18: 30 mL via ORAL
  Filled 2018-02-18: qty 30

## 2018-02-18 MED ORDER — PANTOPRAZOLE SODIUM 40 MG PO TBEC
40.0000 mg | DELAYED_RELEASE_TABLET | Freq: Every day | ORAL | Status: DC
  Administered 2018-02-18 – 2018-02-24 (×7): 40 mg via ORAL
  Filled 2018-02-18 (×7): qty 1

## 2018-02-18 MED ORDER — SODIUM CHLORIDE 0.9% FLUSH
10.0000 mL | INTRAVENOUS | Status: DC | PRN
  Administered 2018-02-19 – 2018-02-23 (×2): 10 mL
  Filled 2018-02-18 (×2): qty 40

## 2018-02-18 MED ORDER — VITAMIN D 25 MCG (1000 UNIT) PO TABS
2000.0000 [IU] | ORAL_TABLET | Freq: Every day | ORAL | Status: DC
  Administered 2018-02-18 – 2018-02-24 (×7): 2000 [IU] via ORAL
  Filled 2018-02-18 (×11): qty 2

## 2018-02-18 MED ORDER — METOPROLOL TARTRATE 12.5 MG HALF TABLET
12.5000 mg | ORAL_TABLET | Freq: Two times a day (BID) | ORAL | Status: DC
  Administered 2018-02-18 – 2018-02-23 (×11): 12.5 mg via ORAL
  Filled 2018-02-18 (×12): qty 1

## 2018-02-18 MED ORDER — VITAMIN B-12 5000 MCG SL SUBL: 5000.0000 ug | SUBLINGUAL_TABLET | Freq: Every day | SUBLINGUAL | Status: DC

## 2018-02-18 MED ORDER — CEPHALEXIN 500 MG PO CAPS
500.0000 mg | ORAL_CAPSULE | Freq: Three times a day (TID) | ORAL | Status: AC
  Administered 2018-02-18 – 2018-02-23 (×17): 500 mg via ORAL
  Filled 2018-02-18 (×17): qty 1

## 2018-02-18 MED ORDER — NITROGLYCERIN 0.4 MG SL SUBL: 0.4000 mg | SUBLINGUAL_TABLET | SUBLINGUAL | Status: DC | PRN

## 2018-02-18 NOTE — Consult Note (Signed)
Physical Medicine and Rehabilitation Consult Reason for Consult:  Decreased functional mobility Referring Physician: [facility]  HPI: Kimberly Vaughn is a 72 y.o.right handed female with history of lymphoma 2018 recent treatment at [facility], atrial fibrillation maintained on Xarelto, CAD, DVT, hypertension, brain tumor with resection 2018. Per chart review, patient, and family patient lives with spouse. One level home with ramped entrance. Used a rolling walker for household ambulation. Wheelchair for community mobility. Daughter lives next door and works during the day. Husband does not provide much assistance. Presented 02/15/2018 with syncope and one episode of unresponsiveness. Cranial CT scan reviewed, unremarkable for acute intracranial process. Potassium 3.1, creatinine 1.24, WBC 2.0, urine culture greater 100,000 Proteus, lactic acid 5.83. Suspect possible septic shock maintain on antibiotic therapy as well as question adrenal insufficiency placed on stress dose steroids. Tolerating a regular diet. Therapy evaluations completed with recommendations of physical medicine rehabilitation consult.  Review of Systems  Constitutional: Negative for chills and fever.  HENT: Negative for hearing loss.   Eyes: Negative for blurred vision and double vision.  Respiratory: Negative for shortness of breath.   Gastrointestinal: Positive for constipation. Negative for nausea and vomiting.       GERD  Genitourinary: Negative for dysuria, flank pain and hematuria.  Musculoskeletal: Positive for joint pain and myalgias.  Skin: Negative for rash.  Neurological: Positive for dizziness and weakness.  All other systems reviewed and are negative.  Past Medical History:  Diagnosis Date  . A-fib ([place])   . Acute deep vein thrombosis (DVT) of left upper extremity ([place]) 11/19/2016   unspecified vein, associated with PICC line  . Anginal pain ([place])   . Asthma    as a child  . BPPV (benign paroxysmal positional  vertigo) 12/25/2015  . Brain tumor ([place]) 09/04/2016  . Coronary artery disease   . DDD (degenerative disc disease), lumbar   . Degenerative joint disease   . Dizziness and giddiness   . Dysrhythmia    A fib  . Episodic atrial fibrillation ([place])   . Esophageal reflux   . GERD (gastroesophageal reflux disease)   . Hiatal hernia   . History of kidney stones   . Hypertension, essential   . Kidney stones   . Liver cyst LEFT  . Myocardial infarction ([place])   . Nodule of parotid gland   . Non-Hodgkin lymphoma ([place])    Brain  . Paroxysmal atrial fibrillation ([place]) 09/01/2016   Chads2Vas score equals 3, but only 1 episodes in 2016, recent event recorder showing no evidence of atrial fibrillation.  . Pneumonia   . PONV (postoperative nausea and vomiting)   . Pulmonary nodule   . PVC (premature ventricular contraction)    Nov 2017 & Feb 2018 noted on cardiac monitor worn at home   Past Surgical History:  Procedure Laterality Date  . APPLICATION OF CRANIAL NAVIGATION N/A 09/04/2016   Procedure: APPLICATION OF CRANIAL NAVIGATION;  Surgeon: Consuella Lose, MD;  Location: [place];  Service: Neurosurgery;  Laterality: N/A;  . BRAIN SURGERY  09/04/2016  . BREAST BIOPSY Bilateral   . CARPAL TUNNEL RELEASE Right 09/2013  . CATARACTS Bilateral   . CERVICAL SPINE SURGERY  04/2012  . FINGER SURGERY Right    TRIGGER FINGER RELEASE  . PARTIAL THYMECTOMY  1973   pt states doesn't remember this being done  . RETROSIGMOID CRANIECTOMY FOR TUMOR RESECTION Left 09/04/2016   Procedure: RETROSIGMOID CRANIECTOMY FOR TUMOR RESECTION WITH BRAINLAB NAVIGATION;  Surgeon: Consuella Lose, MD;  Location: [facility];  Service: Neurosurgery;  Laterality: Left;  CRANIOTOMY TUMOR EXCISION  . TONSILLECTOMY  1964  . VAGINAL HYSTERECTOMY  1974   Family History  Problem Relation Age of Onset  . Heart disease Mother 33  . Psoriasis Mother        PUSTULAR  . Hypertension Mother   . Rheum arthritis Mother   . Heart attack  Father 91  . Heart disease Father   . Lymphoma Sister   . Hypertension Sister   . Melanoma Brother   . Hypertension Brother   . Rheum arthritis Brother   . Hypertension Daughter   . Lymphoma Other   . Osteoarthritis Other   . Melanoma Other   . Hypertension Other    Social History:  reports that she quit smoking about 12 years ago. Her smoking use included cigarettes. She has never used smokeless tobacco. She reports that she does not drink alcohol or use drugs. Allergies: No Active Allergies Medications Prior to Admission  Medication Sig Dispense Refill  . atorvastatin (LIPITOR) 80 MG tablet Take 80 mg by mouth daily at 6 PM.     . Carboxymethylcellulose Sod PF 0.5 % SOLN Apply to eye. One drop in left eye 4 times daily.    . Cholecalciferol (VITAMIN D3) 2000 units TABS Take 2,000 Units by mouth daily.    . Cyanocobalamin (VITAMIN B-12) 5000 MCG SUBL Take 5,000 mcg by mouth daily.     Marland Kitchen dexamethasone (DECADRON) 2 MG tablet Take 2 mg by mouth 2 (two) times daily with a meal.    . dexamethasone (DECADRON) 4 MG tablet Take 4 mg by mouth 2 (two) times daily with a meal.    . erythromycin ophthalmic ointment Place 1 application into the left eye 4 (four) times daily.    Marland Kitchen esomeprazole (NEXIUM) 40 MG capsule Take 40 mg by mouth daily.  12  . magnesium chloride (SLOW-MAG) 64 MG TBEC SR tablet Take 1 tablet by mouth 2 (two) times daily.     . metoprolol tartrate (LOPRESSOR) 25 MG tablet Take 1 tablet (25 mg total) by mouth 2 (two) times daily. 60 tablet 6  . nitroGLYCERIN (NITROSTAT) 0.4 MG SL tablet Place 1 tablet (0.4 mg total) under the tongue every 5 (five) minutes as needed for chest pain. 25 tablet 2  . ondansetron (ZOFRAN) 8 MG tablet Take 8 mg by mouth. Take one tablet every 8 hours as needed for nausea    . Potassium Chloride ER 20 MEQ TBCR Take 1 tablet by mouth 2 (two) times daily.     . rivaroxaban (XARELTO) 10 MG TABS tablet Take 10 mg by mouth daily with supper.     .  sennosides-docusate sodium (SENOKOT-S) 8.6-50 MG tablet Take 1 tablet by mouth 2 (two) times daily.    . tamsulosin (FLOMAX) 0.4 MG CAPS capsule Take 0.4 mg by mouth daily.    Marland Kitchen aspirin EC 81 MG tablet Take 81 mg by mouth daily.      Home: Home Living Family/patient expects to be discharged to:: Private residence Living Arrangements: Spouse/significant other Available Help at Discharge: Available 24 hours/day Type of Home: House Home Access: [place]: One level Bathroom Shower/Tub: Chiropodist: Standard Bathroom Accessibility: Yes Home Equipment: Environmental consultant - 2 wheels, Shower seat, Wheelchair - manual, Hospital bed  Functional History: Prior Function Level of Independence: Independent with assistive device(s) Comments: pt living with husband, RW for household ambulation. w/c for community. mod I with ADLS  Functional Status:  Mobility: Bed Mobility Overal bed mobility: Needs Assistance Bed Mobility: Rolling, Sidelying to Sit Rolling: Supervision Sidelying to sit: Min assist General bed mobility comments: OOB in chair at entry Transfers Overall transfer level: Needs assistance Equipment used: Rolling walker (2 wheeled) Transfers: Sit to/from Stand Sit to Stand: Min assist Stand pivot transfers: Min assist General transfer comment: min A to power up and provide stability Ambulation/Gait Ambulation/Gait assistance: Min assist Gait Distance (Feet): 45 Feet Assistive device: Rolling walker (2 wheeled) Gait Pattern/deviations: Step-to pattern, Step-through pattern General Gait Details: Pt ambulating with close chair follow and min A for stability. L knee hyperextension noted, gait mechanics worsened towards end of visit with fatigued. Good proximity to RW noted Gait velocity: decreases    ADL: ADL Overall ADL's : Needs assistance/impaired Eating/Feeding: Set up, Sitting Eating/Feeding Details (indicate cue type and reason): assist to open  containers Grooming: Set up, Sitting Upper Body Bathing: Minimal assistance, Sitting Lower Body Bathing: Moderate assistance, Sit to/from stand Upper Body Dressing : Minimal assistance, Sitting Lower Body Dressing: Moderate assistance, Sit to/from stand Toilet Transfer: Minimal assistance, Stand-pivot, BSC, RW Toileting- Clothing Manipulation and Hygiene: Moderate assistance, Sit to/from stand  Cognition: Cognition Overall Cognitive Status: Within Functional Limits for tasks assessed Orientation Level: Oriented X4 Cognition Arousal/Alertness: Awake/alert Behavior During Therapy: WFL for tasks assessed/performed Overall Cognitive Status: Within Functional Limits for tasks assessed  Blood pressure (!) 165/78, pulse 63, temperature 98.1 F (36.7 C), temperature source Oral, resp. rate 19, height 5' (1.524 m), weight 61 kg, SpO2 99 %. Physical Exam  Vitals reviewed. Constitutional: She is oriented to person, place, and time. She appears well-developed and well-nourished.  HENT:  Head: Normocephalic and atraumatic.  Eyes: EOM are normal. Right eye exhibits no discharge. Left eye exhibits no discharge.  Pupils reactive to light  Neck: Normal range of motion. Neck supple. No thyromegaly present.  Cardiovascular: Normal rate and regular rhythm.  Respiratory: Effort normal and breath sounds normal. No respiratory distress.  GI: Soft. Bowel sounds are normal. She exhibits no distension.  Musculoskeletal:     Comments: No edema or tenderness in extremities  Neurological: She is alert and oriented to person, place, and time.  Left facial weakness Motor: Bilateral upper extremities: 4+/5 proximal distal Left lower extremity: 4/5 proximal distal Right lower extremity: 4-4+/5 proximal to distal Sensation diminished to light touch left lower extremity  Skin: Skin is warm and dry.  Psychiatric: She has a normal mood and affect. Her behavior is normal.    Results for orders placed or  performed during the hospital encounter of 02/15/18 (from the past 24 hour(s))  Glucose, capillary     Status: Abnormal   Collection Time: 02/17/18  7:36 AM  Result Value Ref Range   Glucose-Capillary 107 (H) 70 - 99 mg/dL  Glucose, capillary     Status: Abnormal   Collection Time: 02/17/18 11:47 AM  Result Value Ref Range   Glucose-Capillary 146 (H) 70 - 99 mg/dL  Glucose, capillary     Status: Abnormal   Collection Time: 02/17/18  3:46 PM  Result Value Ref Range   Glucose-Capillary 124 (H) 70 - 99 mg/dL  Glucose, capillary     Status: Abnormal   Collection Time: 02/17/18  8:17 PM  Result Value Ref Range   Glucose-Capillary 143 (H) 70 - 99 mg/dL  Glucose, capillary     Status: Abnormal   Collection Time: 02/18/18 12:23 AM  Result Value Ref Range   Glucose-Capillary 130 (H) 70 -  99 mg/dL  Glucose, capillary     Status: Abnormal   Collection Time: 02/18/18  4:11 AM  Result Value Ref Range   Glucose-Capillary 105 (H) 70 - 99 mg/dL   No results found.  Assessment/Plan: Diagnosis: Debility Labs and images (see above) independently reviewed.  Records reviewed and summated above.  1. Does the need for close, 24 hr/day medical supervision in concert with the patient's rehab needs make it unreasonable for this patient to be served in a less intensive setting? Yes  2. Co-Morbidities requiring supervision/potential complications: atrial fibrillation (continue meds, monitor heart rate with increased physical activity), CAD, DVT, HTN (monitor and provide prns in accordance with increased physical exertion and pain), brain tumor with resection, hypokalemia (continue to monitor and replete as necessary) 3. Due to safety, disease management and patient education, does the patient require 24 hr/day rehab nursing? Yes 4. Does the patient require coordinated care of a physician, rehab nurse, PT (1-2 hrs/day, 5 days/week), OT (1-2 hrs/day, 5 days/week) and SLP (1-2 hrs/day, 5 days/week) to address  physical and functional deficits in the context of the above medical diagnosis(es)? Yes Addressing deficits in the following areas: balance, endurance, locomotion, strength, transferring, bathing, dressing, toileting, cognition and psychosocial support 5. Can the patient actively participate in an intensive therapy program of at least 3 hrs of therapy per day at least 5 days per week? Yes 6. The potential for patient to make measurable gains while on inpatient rehab is excellent 7. Anticipated functional outcomes upon discharge from inpatient rehab are supervision and min assist  with PT, supervision and min assist with OT, modified independent and supervision with SLP. 8. Estimated rehab length of stay to reach the above functional goals is: 10-14 days. 9. Anticipated D/C setting: Home 10. Anticipated post D/C treatments: HH therapy and Home excercise program 11. Overall Rehab/Functional Prognosis: good  RECOMMENDATIONS: This patient's condition is appropriate for continued rehabilitative care in the following setting: CIR Patient has agreed to participate in recommended program. Yes Note that insurance prior authorization may be required for reimbursement for recommended care.  Comment: Rehab Admissions Coordinator to follow up.   I have personally performed a face to face diagnostic evaluation, including, but not limited to relevant history and physical exam findings, of this patient and developed relevant assessment and plan.  Additionally, I have reviewed and concur with the physician assistant's documentation above.   Delice Lesch, MD, ABPMR Lavon Paganini Angiulli, PA-C 02/18/2018

## 2018-02-18 NOTE — Progress Notes (Signed)
Inpatient Rehabilitation-Admissions Coordinator    Met with patient and her daugther at the bedside to discuss team's recommendation for inpatient rehabilitation. Shared booklets, expectations while in CIR, expected length of stay, and anticipated functional level at DC. Pt and her daughter want to pursue CIR at this time. AC has started insurance authorization for possible admit. Will follow up with pt on Monday.   Please call if questions.   Jhonnie Garner, OTR/L  Rehab Admissions Coordinator  [phone] 02/18/2018 5:38 PM

## 2018-02-18 NOTE — NC FL2 (Signed)
[place] LEVEL OF CARE SCREENING TOOL     IDENTIFICATION  Patient Name: Kimberly Vaughn Birthdate: 1945-10-10 Sex: female Admission Date (Current Location): 02/15/2018  [facility] and [place] Number:  Herbalist and Address:  [facility], [place] [address], [place],  [address]      Provider Number: [id number]  Attending Physician Name and Address:  Jonetta Osgood, MD  Relative Name and Phone Number:  Jeani Hawking, daughter, [phone]    Current Level of Care: Hospital Recommended Level of Care: [place] Prior Approval Number:    Date Approved/Denied:   PASRR Number: [id number]  Discharge Plan: SNF    Current Diagnoses: Patient Active Problem List   Diagnosis Date Noted  . Sepsis ([place]) 02/15/2018  . Lagophthalmos of left upper eyelid 12/03/2016  . Corneal abrasion, left, sequela 12/03/2016  . Cranial nerve VI palsy, left 11/26/2016  . Cranial nerve VII palsy 11/26/2016  . Anemia associated with chemotherapy 11/26/2016  . Vitamin D deficiency 11/26/2016  . Acute deep vein thrombosis (DVT) of left upper extremity ([place]) 11/19/2016  . Severe sepsis ([place]) 11/12/2016  . Bacteremia due to methicillin susceptible Staphylococcus aureus (MSSA) 11/12/2016  . Klebsiella cystitis 11/12/2016  . Pancytopenia ([place]) 11/12/2016  . Acute megaloblastic anemia due to severe illness 11/12/2016  . Bilateral lower extremity edema 11/12/2016  . Hyperlipidemia 11/12/2016  . CNS lymphoma ([place])   . Diffuse large B cell lymphoma ([place]) 09/16/2016  . Orthostatic hypotension   . Premature atrial complex   . Steroid-induced hyperglycemia   . Labile blood pressure   . Dysphagia   . PVC (premature ventricular contraction)   . Hypoalbuminemia due to protein-calorie malnutrition ([place])   . Leucocytosis   . Brain mass 09/09/2016  . Ataxia   . Benign essential HTN   . PAF (paroxysmal atrial fibrillation) ([place])   . Gastroesophageal  reflux disease   . Diarrhea   . History of MI (myocardial infarction)   . Acute blood loss anemia   . Hypokalemia   . Post-operative pain   . Mass of brain 09/04/2016  . Brain tumor ([place]) 09/04/2016  . Coronary artery disease 09/01/2016  . Paroxysmal atrial fibrillation ([place]) 09/01/2016  . Essential hypertension 09/01/2016  . BPPV (benign paroxysmal positional vertigo) 12/25/2015    Orientation RESPIRATION BLADDER Height & Weight     Self, Time, Situation, Place  Normal Continent Weight: 61 kg Height:  5' (152.4 cm)  BEHAVIORAL SYMPTOMS/MOOD NEUROLOGICAL BOWEL NUTRITION STATUS      Continent Diet(Please see DC Summary)  AMBULATORY STATUS COMMUNICATION OF NEEDS Skin   Limited Assist Verbally Normal                       Personal Care Assistance Level of Assistance  Bathing, Feeding, Dressing Bathing Assistance: Limited assistance Feeding assistance: Independent Dressing Assistance: Limited assistance     Functional Limitations Info  Sight, Hearing, Speech Sight Info: Adequate Hearing Info: Impaired Speech Info: Adequate    SPECIAL CARE FACTORS FREQUENCY  PT (By licensed PT), OT (By licensed OT)     PT Frequency: 5x/week OT Frequency: 3x/week            Contractures Contractures Info: Not present    Additional Factors Info  Code Status, Allergies, Insulin Sliding Scale Code Status Info: DNR Allergies Info: NKA   Insulin Sliding Scale Info: Every 4 hours       Current Medications (02/18/2018):  This is  the current hospital active medication list Current Facility-Administered Medications  Medication Dose Route Frequency Provider Last Rate Last Dose  . 0.9 %  sodium chloride infusion  250 mL Intravenous Continuous Kipp Brood, MD 10 mL/hr at 02/17/18 1800    . 0.9 %  sodium chloride infusion   Intra-arterial PRN Kipp Brood, MD 10 mL/hr at 02/18/18 0629    . acetaminophen (TYLENOL) suppository 650 mg  650 mg Rectal Q4H PRN Elsie Lincoln, MD   650  mg at 02/15/18 2239  . cefTRIAXone (ROCEPHIN) 2 g in sodium chloride 0.9 % 100 mL IVPB  2 g Intravenous Q24H Agarwala, Einar Grad, MD 200 mL/hr at 02/18/18 0922 2 g at 02/18/18 0922  . eye wash ((SODIUM/POTASSIUM/SOD CHLORIDE)) ophthalmic solution 1 drop  1 drop Both Eyes PRN Mannam, Praveen, MD      . hydrocortisone sodium succinate (SOLU-CORTEF) 100 MG injection 25 mg  25 mg Intravenous Q8H Ghimire, Shanker M, MD      . insulin aspart (novoLOG) injection 1-3 Units  1-3 Units Subcutaneous Q4H Kipp Brood, MD   1 Units at 02/18/18 0035  . norepinephrine (LEVOPHED) 4mg  in D5W 245mL premix infusion  5-50 mcg/min Intravenous Titrated Kipp Brood, MD   Stopped at 02/17/18 0710  . rivaroxaban (XARELTO) tablet 10 mg  10 mg Oral Q supper Harvel Quale, [facility]   10 mg at 02/17/18 1654     Discharge Medications: Please see discharge summary for a list of discharge medications.  Relevant Imaging Results:  Relevant Lab Results:   Additional Information SSN: [place] [address] [place], [place]

## 2018-02-18 NOTE — Progress Notes (Addendum)
PROGRESS NOTE        PATIENT DETAILS Name: Kimberly Vaughn Age: 72 y.o. Sex: female Date of Birth: 19-May-1945 Admit Date: 02/15/2018 Admitting Physician Kipp Brood, MD [id number], Chrys Racer, MD  Brief Narrative: Patient is a 72 y.o. 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  Patient was admitted to the intensive care unit and managed by PCCM, upon stability was transferred to the triad hospitalist service on 12/20.  Subjective: No chest pain or shortness of breath.  Sitting up-at bedside chair this morning.  No nausea vomiting.  Assessment/Plan: Septic shock with neutropenic fever secondary to complicated UTI: Sepsis pathophysiology has resolved, she is afebrile and currently nontoxic-appearing.  Neutropenia has now resolved.  She briefly required pressor support in the ICU.  Blood cultures are negative, however urine culture is positive for Proteus-sensitive to Rocephin.  Since she is clinically improved-we will stop Rocephin and transition to Keflex (will need at least 7 days of treatment given severity of presentation).  Continue to taper off stress dose hydrocortisone-suspect she can be transitioned to her usual daily regimen of Decadron starting 12/21.  Anemia: Suspect has anemia secondary to chronic disease-likely worsened by acute illness.  No overt blood loss apparent.  Follow periodically.  AKI: Likely hemodynamically mediated-has resolved-creatinine is now normal.  PAF: Rate controlled-resume metoprolol but at lower dose at home-continue Xarelto.  History of CAD: No anginal symptoms.  Appears stable.  Suspect not on antiplatelet agents as patient is on anticoagulation, continue metoprolol and statin.  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  DVT Prophylaxis: Full dose anticoagulation with Xarelto  Code Status:  DNR  Family Communication: Daughter over the phone  Disposition Plan: Remain inpatient-CIR vs SNF on discharge  Antimicrobial agents: Anti-infectives (From admission, onward)   Start     Dose/Rate Route Frequency Ordered Stop   02/17/18 1000  cefTRIAXone (ROCEPHIN) 2 g in sodium chloride 0.9 % 100 mL IVPB     2 g 200 mL/hr over 30 Minutes Intravenous Every 24 hours 02/17/18 0951     02/17/18 0100  vancomycin (VANCOCIN) IVPB 1000 mg/200 mL premix  Status:  Discontinued     1,000 mg 200 mL/hr over 60 Minutes Intravenous Every 36 hours 02/15/18 1704 02/17/18 0951   02/16/18 1300  ceFEPIme (MAXIPIME) 2 g in sodium chloride 0.9 % 100 mL IVPB  Status:  Discontinued     2 g 200 mL/hr over 30 Minutes Intravenous Every 24 hours 02/15/18 1704 02/17/18 0951   02/15/18 1300  vancomycin (VANCOCIN) 1,250 mg in sodium chloride 0.9 % 250 mL IVPB     1,250 mg 166.7 mL/hr over 90 Minutes Intravenous  Once 02/15/18 1219 02/15/18 1446   02/15/18 1230  ceFEPIme (MAXIPIME) 2 g in sodium chloride 0.9 % 100 mL IVPB     2 g 200 mL/hr over 30 Minutes Intravenous  Once 02/15/18 1217 02/15/18 1323   02/15/18 1230  metroNIDAZOLE (FLAGYL) IVPB 500 mg  Status:  Discontinued     500 mg 100 mL/hr over 60 Minutes Intravenous Every 8 hours 02/15/18 1217 02/17/18 0951   02/15/18 1230  vancomycin (VANCOCIN) IVPB 1000 mg/200 mL premix  Status:  Discontinued  1,000 mg 200 mL/hr over 60 Minutes Intravenous  Once 02/15/18 1217 02/15/18 1219      Procedures: None  CONSULTS:  pulmonary/intensive care  Time spent: 25- minutes-Greater than 50% of this time was spent in counseling, explanation of diagnosis, planning of further management, and coordination of care.  MEDICATIONS: Scheduled Meds: . hydrocortisone sodium succinate  25 mg Intravenous Q8H  . insulin aspart  1-3 Units Subcutaneous Q4H  .  rivaroxaban  10 mg Oral Q supper   Continuous Infusions: . sodium chloride 10 mL/hr at 02/17/18 1800  . sodium chloride 10 mL/hr at 02/18/18 0629  . cefTRIAXone (ROCEPHIN)  IV 2 g (02/18/18 0922)  . norepinephrine (LEVOPHED) Adult infusion Stopped (02/17/18 0710)   PRN Meds:.Place/Maintain arterial line **AND** sodium chloride, acetaminophen, eye wash   PHYSICAL EXAM: Vital signs: Vitals:   02/17/18 1800 02/17/18 2221 02/18/18 0500 02/18/18 0629  BP: (!) 144/67 (!) 148/71  (!) 165/78  Pulse: 72 75  63  Resp: 16 19    Temp:  98.6 F (37 C)  98.1 F (36.7 C)  TempSrc:    Oral  SpO2: 97% 98%  99%  Weight:   61 kg   Height:       Filed Weights   02/16/18 0500 02/17/18 0500 02/18/18 0500  Weight: 61.8 kg 60.9 kg 61 kg   Body mass index is 26.26 kg/m.   General appearance :Awake, alert, not in any distress.  Left facial droop HEENT: Atraumatic and Normocephalic Neck: supple, no JVD. No cervical lymphadenopathy.  Resp:Good air entry bilaterally, no added sounds  CVS: S1 S2 regular, no murmurs.  GI: Bowel sounds present, Non tender and not distended with no gaurding, rigidity or rebound.No organomegaly Extremities: B/L Lower Ext shows no edema, both legs are warm to touch Neurology: Nonfocal Musculoskeletal:No digital cyanosis Skin:No Rash, warm and dry Wounds:N/A  I have personally reviewed following labs and imaging studies  LABORATORY DATA: CBC: Recent Labs  Lab 02/15/18 1218 02/16/18 0216 02/17/18 0515  WBC 2.0* 6.9 8.7  NEUTROABS 0.8*  --   --   HGB 10.8* 9.6* 8.4*  HCT 36.8 30.5* 27.3*  MCV 88.0 84.3 84.8  PLT 209 194 267    Basic Metabolic Panel: Recent Labs  Lab 02/15/18 1218 02/15/18 1957 02/16/18 0216 02/17/18 0515  NA 140 137 137 139  K 3.1* 3.1* 3.2* 3.0*  CL 103 106 105 105  CO2 23 21* 21* 25  GLUCOSE 99 99 96 113*  BUN 20 21 18 15   CREATININE 1.24* 1.01* 0.84 0.72  CALCIUM 8.5* 7.8* 7.7* 7.8*  MG  --   --  1.3* 2.1  PHOS  --   --   3.2 2.3*    GFR: Estimated Creatinine Clearance: 50.4 mL/min (by C-G formula based on SCr of 0.72 mg/dL).  Liver Function Tests: Recent Labs  Lab 02/15/18 1218  AST 19  ALT 22  ALKPHOS 52  BILITOT 0.9  PROT 5.0*  ALBUMIN 3.0*   No results for input(s): LIPASE, AMYLASE in the last 168 hours. No results for input(s): AMMONIA in the last 168 hours.  Coagulation Profile: No results for input(s): INR, PROTIME in the last 168 hours.  Cardiac Enzymes: No results for input(s): CKTOTAL, CKMB, CKMBINDEX, TROPONINI in the last 168 hours.  BNP (last 3 results) No results for input(s): PROBNP in the last 8760 hours.  HbA1C: No results for input(s): HGBA1C in the last 72 hours.  CBG: Recent Labs  Lab 02/17/18 1546 02/17/18 2017  02/18/18 0023 02/18/18 0411 02/18/18 0818  GLUCAP 124* 143* 130* 105* 89    Lipid Profile: No results for input(s): CHOL, HDL, LDLCALC, TRIG, CHOLHDL, LDLDIRECT in the last 72 hours.  Thyroid Function Tests: No results for input(s): TSH, T4TOTAL, FREET4, T3FREE, THYROIDAB in the last 72 hours.  Anemia Panel: No results for input(s): VITAMINB12, FOLATE, FERRITIN, TIBC, IRON, RETICCTPCT in the last 72 hours.  Urine analysis:    Component Value Date/Time   COLORURINE AMBER (A) 02/15/2018 1350   APPEARANCEUR HAZY (A) 02/15/2018 1350   LABSPEC 1.011 02/15/2018 1350   PHURINE 8.0 02/15/2018 1350   GLUCOSEU NEGATIVE 02/15/2018 1350   HGBUR NEGATIVE 02/15/2018 1350   BILIRUBINUR NEGATIVE 02/15/2018 1350   KETONESUR NEGATIVE 02/15/2018 1350   PROTEINUR 30 (A) 02/15/2018 1350   NITRITE POSITIVE (A) 02/15/2018 1350   LEUKOCYTESUR SMALL (A) 02/15/2018 1350    Sepsis Labs: Lactic Acid, Venous    Component Value Date/Time   LATICACIDVEN 2.2 ([place]) 02/15/2018 1957    MICROBIOLOGY: Recent Results (from the past 240 hour(s))  Blood Culture (routine x 2)     Status: None (Preliminary result)   Collection Time: 02/15/18 12:30 PM  Result Value Ref  Range Status   Specimen Description BLOOD LEFT ANTECUBITAL  Final   Special Requests   Final    BOTTLES DRAWN AEROBIC AND ANAEROBIC Blood Culture adequate volume   Culture   Final    NO GROWTH 2 DAYS Performed at [facility], [place] [address] [place], [place] [address]    Report Status PENDING  Incomplete  Blood Culture (routine x 2)     Status: None (Preliminary result)   Collection Time: 02/15/18 12:39 PM  Result Value Ref Range Status   Specimen Description BLOOD RIGHT HAND  Final   Special Requests   Final    BOTTLES DRAWN AEROBIC ONLY Blood Culture results may not be optimal due to an inadequate volume of blood received in culture bottles   Culture   Final    NO GROWTH 2 DAYS Performed at [facility], [place] [address] [place], [place] [address]    Report Status PENDING  Incomplete  Urine culture     Status: Abnormal   Collection Time: 02/15/18  1:50 PM  Result Value Ref Range Status   Specimen Description URINE, RANDOM  Final   Special Requests   Final    NONE Performed at [facility], [place] [address], [place], [place] [address]    Culture >=100,000 COLONIES/mL PROTEUS MIRABILIS (A)  Final   Report Status 02/17/2018 FINAL  Final   Organism ID, Bacteria PROTEUS MIRABILIS (A)  Final      Susceptibility   Proteus mirabilis - MIC*    AMPICILLIN <=2 SENSITIVE Sensitive     CEFAZOLIN <=4 SENSITIVE Sensitive     CEFTRIAXONE <=1 SENSITIVE Sensitive     CIPROFLOXACIN <=0.25 SENSITIVE Sensitive     GENTAMICIN <=1 SENSITIVE Sensitive     IMIPENEM 4 SENSITIVE Sensitive     NITROFURANTOIN 128 RESISTANT Resistant     TRIMETH/SULFA <=20 SENSITIVE Sensitive     AMPICILLIN/SULBACTAM <=2 SENSITIVE Sensitive     PIP/TAZO <=4 SENSITIVE Sensitive     * >=100,000 COLONIES/mL PROTEUS MIRABILIS  MRSA PCR Screening     Status: None   Collection Time: 02/16/18 10:14 PM  Result Value Ref Range Status   MRSA by PCR NEGATIVE NEGATIVE Final    Comment:        The  GeneXpert  MRSA Assay (FDA approved for NASAL specimens only), is one component of a comprehensive MRSA colonization surveillance program. It is not intended to diagnose MRSA infection nor to guide or monitor treatment for MRSA infections. Performed at [facility], [place] [address], [place], [place] [address]     RADIOLOGY STUDIES/RESULTS: Ct Head Wo Contrast  Result Date: 02/15/2018 CLINICAL DATA:  Altered mental status.  Apnea.  Known CNS lymphoma. EXAM: CT HEAD WITHOUT CONTRAST TECHNIQUE: Contiguous axial images were obtained from the base of the skull through the vertex without intravenous contrast. COMPARISON:  MRI head 09/05/2016.  CT head 08/27/2016 FINDINGS: Brain: Left occipital craniotomy for resection of posterior fossa tumor on the left. Hypodensity in the left middle cerebellar peduncle extending into the inferior cerebellum compatible with postop resection site. Hyperdense tissue lining the cavity could represent residual tumor. Overall the appearance appears similar to the prior MRI of 09/05/2016 Ventricle size normal.  No acute infarct, hemorrhage. Vascular: Negative for hyperdense vessel Skull: Left occipital craniotomy.  No acute abnormality. Sinuses/Orbits: Paranasal sinuses clear. Bilateral cataract surgery. Other: None IMPRESSION: No acute abnormality Postsurgical changes of tumor resection in the left posterior fossa. MRI with contrast would be necessary to evaluate for residual or recurrent tumor. Electronically Signed   By: Franchot Gallo M.D.   On: 02/15/2018 13:56   US Abdomen Complete  Result Date: 02/16/2018 CLINICAL DATA:  Pyelonephritis EXAM: ABDOMEN ULTRASOUND COMPLETE COMPARISON:  None. FINDINGS: Gallbladder: A negative sonographic Percell Miller sign was reported by the sonographer. There is cholelithiasis and a small amount of gallbladder sludge. No wall thickening. Common bile duct: Diameter: 2 mm Liver: There is a 1.2 cm cyst in the left lobe. Within normal limits in  parenchymal echogenicity. Portal vein is patent on color Doppler imaging with normal direction of blood flow towards the liver. IVC: No abnormality visualized. Pancreas: Visualized portion unremarkable. Spleen: Size and appearance within normal limits. Right Kidney: Length: 12.0 cm. Echogenicity within normal limits. No mass or hydronephrosis visualized. Left Kidney: Length: 10.8 cm. Echogenicity within normal limits. No mass or hydronephrosis visualized. Abdominal aorta: No aneurysm visualized. Other findings: None. IMPRESSION: 1. Cholelithiasis without other evidence of acute cholecystitis. 2. Otherwise normal abdominal ultrasound. Electronically Signed   By: Ulyses Jarred M.D.   On: 02/16/2018 03:41   Dg Chest Port 1 View  Result Date: 02/16/2018 CLINICAL DATA:  Sepsis EXAM: PORTABLE CHEST 1 VIEW COMPARISON:  02/15/2018 FINDINGS: Right Port-A-Cath remains in place, unchanged. Mild cardiomegaly. No confluent airspace opacities or significant effusions. No acute bony abnormality. IMPRESSION: Cardiomegaly.  No active disease. Electronically Signed   By: Rolm Baptise M.D.   On: 02/16/2018 07:31   Dg Chest Port 1 View  Result Date: 02/15/2018 CLINICAL DATA:  Multiple syncopal episodes. EXAM: PORTABLE CHEST 1 VIEW COMPARISON:  Chest x-ray 09/04/2016 FINDINGS: The cardiac silhouette, mediastinal and hilar contours are within normal limits and stable. Coronary artery stents are noted. Right IJ Port-A-Cath appears to be in good position in the distal SVC. The end of the catheter appears somewhat irregular but this may be artifact from something on the patient's chest. A dedicated two view chest x-ray may be helpful for further evaluation. The lungs are clear of an acute process. No infiltrates, edema or effusions. No pneumothorax. The bony thorax is intact. IMPRESSION: 1. No acute cardiopulmonary findings. 2. Somewhat irregular appearance of the tip of the right IJ Port-A-Cath, possibly due to artifact. A repeat  two-view chest x-ray when able may be  helpful. Electronically Signed   By: Marijo Sanes M.D.   On: 02/15/2018 12:38     LOS: 3 days   Oren Binet, MD  [facility]  If 7PM-7AM, please contact night-coverage  Please page via [email or web address]-click on MD name and type text message  02/18/2018, 10:51 AM

## 2018-02-19 DIAGNOSIS — N12 Tubulo-interstitial nephritis, not specified as acute or chronic: Secondary | ICD-10-CM

## 2018-02-19 DIAGNOSIS — Z87898 Personal history of other specified conditions: Secondary | ICD-10-CM

## 2018-02-19 LAB — CBC
HCT: 30.1 % — ABNORMAL LOW (ref 36.0–46.0)
Hemoglobin: 9.3 g/dL — ABNORMAL LOW (ref 12.0–15.0)
MCH: 26.4 pg (ref 26.0–34.0)
MCHC: 30.9 g/dL (ref 30.0–36.0)
MCV: 85.5 fL (ref 80.0–100.0)
Platelets: 162 10*3/uL (ref 150–400)
RBC: 3.52 MIL/uL — ABNORMAL LOW (ref 3.87–5.11)
RDW: 19.9 % — ABNORMAL HIGH (ref 11.5–15.5)
WBC: 4.6 10*3/uL (ref 4.0–10.5)
nRBC: 0 % (ref 0.0–0.2)

## 2018-02-19 LAB — BASIC METABOLIC PANEL
Anion gap: 9 (ref 5–15)
BUN: 27 mg/dL — ABNORMAL HIGH (ref 8–23)
CO2: 24 mmol/L (ref 22–32)
Calcium: 7.8 mg/dL — ABNORMAL LOW (ref 8.9–10.3)
Chloride: 105 mmol/L (ref 98–111)
Creatinine, Ser: 0.7 mg/dL (ref 0.44–1.00)
GFR calc Af Amer: >60 mL/min (ref >60)
GFR calc non Af Amer: >60 mL/min (ref >60)
Glucose, Bld: 107 mg/dL — ABNORMAL HIGH (ref 70–99)
POTASSIUM: 3.5 mmol/L (ref 3.5–5.1)
Sodium: 138 mmol/L (ref 135–145)

## 2018-02-19 LAB — GLUCOSE, CAPILLARY
GLUCOSE-CAPILLARY: 97 mg/dL (ref 70–99)
Glucose-Capillary: 82 mg/dL (ref 70–99)
Glucose-Capillary: 91 mg/dL (ref 70–99)
Glucose-Capillary: 158 mg/dL — ABNORMAL HIGH (ref 70–99)

## 2018-02-19 LAB — TROPONIN I
Troponin I: 0.54 ng/mL (ref <0.03)
Troponin I: 0.49 ng/mL (ref <0.03)

## 2018-02-19 MED ORDER — MAGNESIUM CHLORIDE 64 MG PO TBEC
1.0000 | DELAYED_RELEASE_TABLET | Freq: Two times a day (BID) | ORAL | Status: DC
  Administered 2018-02-19 – 2018-02-24 (×11): 64 mg via ORAL
  Filled 2018-02-19 (×11): qty 1

## 2018-02-19 MED ORDER — ALUM & MAG HYDROXIDE-SIMETH 200-200-20 MG/5ML PO SUSP: 30.0000 mL | Freq: Four times a day (QID) | ORAL | Status: DC | PRN

## 2018-02-19 MED ORDER — DEXAMETHASONE 4 MG PO TABS
2.0000 mg | ORAL_TABLET | Freq: Two times a day (BID) | ORAL | Status: DC
  Administered 2018-02-19 – 2018-02-24 (×11): 2 mg via ORAL
  Filled 2018-02-19 (×11): qty 1

## 2018-02-19 MED ORDER — TAMSULOSIN HCL 0.4 MG PO CAPS
0.4000 mg | ORAL_CAPSULE | Freq: Every day | ORAL | Status: DC
  Administered 2018-02-19 – 2018-02-24 (×6): 0.4 mg via ORAL
  Filled 2018-02-19 (×7): qty 1

## 2018-02-19 NOTE — Progress Notes (Signed)
PROGRESS NOTE        PATIENT DETAILS Name: Kimberly Vaughn Age: 72 y.o. Sex: female Date of Birth: May 11, 1945 Admit Date: 02/15/2018 Admitting Physician Kipp Brood, MD HEN:IDPOEUMP, Chrys Racer, MD  Brief Narrative: Patient is a 72 y.o. 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  Patient was admitted to the intensive care unit and managed by PCCM, upon stability was transferred to the triad hospitalist service on 12/20.  Subjective:  Patient in bed, appears comfortable, denies any headache, no fever, no chest pain or pressure, no shortness of breath , no abdominal pain. No focal weakness.    Assessment/Plan:  Septic shock with neutropenic fever secondary to complicated Proteus UTI: Blood cultures negative, sepsis pathophysiology has resolved, initially was in the ICU and required pressor support.  Now transition to oral Keflex with stop date of 02/23/2018.  On home dose Decadron.  Continue to monitor.   Anemia: Combination of anemia of chronic disease along with some hemodilution from IV fluids.  No acute blood loss or signs of bleeding.  Outpatient PCP follow-up and monitor.    AKI: Due to sepsis.  Resolved after hydration.  PAF: Rate controlled-resume metoprolol & continue Xarelto.  History of CAD: No anginal symptoms.  Appears stable.  Suspect not on antiplatelet agents as patient is on anticoagulation, continue metoprolol and statin.  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  To new home dose Decadron.    DVT Prophylaxis: Full dose anticoagulation with Xarelto  Code Status:  DNR  Family Communication: Daughter over the phone  Disposition Plan: Remain inpatient-CIR vs SNF on discharge  Antimicrobial  agents: Anti-infectives (From admission, onward)   Start     Dose/Rate Route Frequency Ordered Stop   02/18/18 1400  cephALEXin (KEFLEX) capsule 500 mg     500 mg Oral Every 8 hours 02/18/18 1154     02/17/18 1000  cefTRIAXone (ROCEPHIN) 2 g in sodium chloride 0.9 % 100 mL IVPB  Status:  Discontinued     2 g 200 mL/hr over 30 Minutes Intravenous Every 24 hours 02/17/18 0951 02/18/18 1154   02/17/18 0100  vancomycin (VANCOCIN) IVPB 1000 mg/200 mL premix  Status:  Discontinued     1,000 mg 200 mL/hr over 60 Minutes Intravenous Every 36 hours 02/15/18 1704 02/17/18 0951   02/16/18 1300  ceFEPIme (MAXIPIME) 2 g in sodium chloride 0.9 % 100 mL IVPB  Status:  Discontinued     2 g 200 mL/hr over 30 Minutes Intravenous Every 24 hours 02/15/18 1704 02/17/18 0951   02/15/18 1300  vancomycin (VANCOCIN) 1,250 mg in sodium chloride 0.9 % 250 mL IVPB     1,250 mg 166.7 mL/hr over 90 Minutes Intravenous  Once 02/15/18 1219 02/15/18 1446   02/15/18 1230  ceFEPIme (MAXIPIME) 2 g in sodium chloride 0.9 % 100 mL IVPB     2 g 200 mL/hr over 30 Minutes Intravenous  Once 02/15/18 1217 02/15/18 1323   02/15/18 1230  metroNIDAZOLE (FLAGYL) IVPB 500 mg  Status:  Discontinued     500 mg 100 mL/hr over 60 Minutes Intravenous Every 8 hours 02/15/18 1217 02/17/18 0951   02/15/18 1230  vancomycin (VANCOCIN) IVPB 1000  mg/200 mL premix  Status:  Discontinued     1,000 mg 200 mL/hr over 60 Minutes Intravenous  Once 02/15/18 1217 02/15/18 1219      Procedures: None  CONSULTS:  pulmonary/intensive care  Time spent: 25- minutes-Greater than 50% of this time was spent in counseling, explanation of diagnosis, planning of further management, and coordination of care.  MEDICATIONS: Scheduled Meds: . atorvastatin  80 mg Oral q1800  . cephALEXin  500 mg Oral Q8H  . cholecalciferol  2,000 Units Oral Daily  . dexamethasone  2 mg Oral BID WC  . insulin aspart  1-3 Units Subcutaneous Q4H  . magnesium chloride  1  tablet Oral BID  . metoprolol tartrate  12.5 mg Oral BID  . pantoprazole  40 mg Oral Daily  . rivaroxaban  10 mg Oral Q supper  . tamsulosin  0.4 mg Oral Daily  . vitamin B-12  5,000 mcg Oral Daily   Continuous Infusions: . sodium chloride 10 mL/hr at 02/17/18 1800  . sodium chloride 10 mL/hr at 02/19/18 0758   PRN Meds:.Place/Maintain arterial line **AND** sodium chloride, acetaminophen, alum & mag hydroxide-simeth, eye wash, nitroGLYCERIN, sodium chloride flush   PHYSICAL EXAM: Vital signs: Vitals:   02/18/18 1747 02/18/18 2113 02/19/18 0504 02/19/18 0912  BP: (!) 153/68 (!) 159/71 (!) 156/63   Pulse: (!) 57 69 (!) 47 72  Resp:  18 18   Temp:  98.2 F (36.8 C) (!) 97.2 F (36.2 C)   TempSrc:  Oral Oral   SpO2: 98% 97% 99%   Weight:      Height:       Filed Weights   02/16/18 0500 02/17/18 0500 02/18/18 0500  Weight: 61.8 kg 60.9 kg 61 kg   Body mass index is 26.26 kg/m.   Exam  Awake Alert, Oriented X 3, Left-sided facial droop, Normal affect [place].AT,PERRAL Supple Neck,No JVD, No cervical lymphadenopathy appriciated.  Symmetrical Chest wall movement, Good air movement bilaterally, CTAB RRR,No Gallops, Rubs or new Murmurs, No Parasternal Heave +ve B.Sounds, Abd Soft, No tenderness, No organomegaly appriciated, No rebound - guarding or rigidity. No Cyanosis, Clubbing or edema, No new Rash or bruise   I have personally reviewed following labs and imaging studies  LABORATORY DATA: CBC: Recent Labs  Lab 02/15/18 1218 02/16/18 0216 02/17/18 0515 02/19/18 0936  WBC 2.0* 6.9 8.7 4.6  NEUTROABS 0.8*  --   --   --   HGB 10.8* 9.6* 8.4* 9.3*  HCT 36.8 30.5* 27.3* 30.1*  MCV 88.0 84.3 84.8 85.5  PLT 209 194 170 419    Basic Metabolic Panel: Recent Labs  Lab 02/15/18 1218 02/15/18 1957 02/16/18 0216 02/17/18 0515  NA 140 137 137 139  K 3.1* 3.1* 3.2* 3.0*  CL 103 106 105 105  CO2 23 21* 21* 25  GLUCOSE 99 99 96 113*  BUN 20 21 18 15   CREATININE 1.24*  1.01* 0.84 0.72  CALCIUM 8.5* 7.8* 7.7* 7.8*  MG  --   --  1.3* 2.1  PHOS  --   --  3.2 2.3*    GFR: Estimated Creatinine Clearance: 50.4 mL/min (by C-G formula based on SCr of 0.72 mg/dL).  Liver Function Tests: Recent Labs  Lab 02/15/18 1218  AST 19  ALT 22  ALKPHOS 52  BILITOT 0.9  PROT 5.0*  ALBUMIN 3.0*   No results for input(s): LIPASE, AMYLASE in the last 168 hours. No results for input(s): AMMONIA in the last 168 hours.  Coagulation Profile:  No results for input(s): INR, PROTIME in the last 168 hours.  Cardiac Enzymes: Recent Labs  Lab 02/18/18 2137 02/19/18 0324  TROPONINI 0.75* 0.54*    BNP (last 3 results) No results for input(s): PROBNP in the last 8760 hours.  HbA1C: No results for input(s): HGBA1C in the last 72 hours.  CBG: Recent Labs  Lab 02/18/18 0818 02/18/18 1352 02/18/18 1745 02/18/18 2126 02/19/18 0737  GLUCAP 89 110* 95 123* 82    Lipid Profile: No results for input(s): CHOL, HDL, LDLCALC, TRIG, CHOLHDL, LDLDIRECT in the last 72 hours.  Thyroid Function Tests: No results for input(s): TSH, T4TOTAL, FREET4, T3FREE, THYROIDAB in the last 72 hours.  Anemia Panel: No results for input(s): VITAMINB12, FOLATE, FERRITIN, TIBC, IRON, RETICCTPCT in the last 72 hours.  Urine analysis:    Component Value Date/Time   COLORURINE AMBER (A) 02/15/2018 1350   APPEARANCEUR HAZY (A) 02/15/2018 1350   LABSPEC 1.011 02/15/2018 1350   PHURINE 8.0 02/15/2018 1350   GLUCOSEU NEGATIVE 02/15/2018 1350   HGBUR NEGATIVE 02/15/2018 1350   BILIRUBINUR NEGATIVE 02/15/2018 1350   KETONESUR NEGATIVE 02/15/2018 1350   PROTEINUR 30 (A) 02/15/2018 1350   NITRITE POSITIVE (A) 02/15/2018 1350   LEUKOCYTESUR SMALL (A) 02/15/2018 1350    Sepsis Labs: Lactic Acid, Venous    Component Value Date/Time   LATICACIDVEN 2.2 ([place]) 02/15/2018 1957    MICROBIOLOGY: Recent Results (from the past 240 hour(s))  Blood Culture (routine x 2)     Status: None  (Preliminary result)   Collection Time: 02/15/18 12:30 PM  Result Value Ref Range Status   Specimen Description BLOOD LEFT ANTECUBITAL  Final   Special Requests   Final    BOTTLES DRAWN AEROBIC AND ANAEROBIC Blood Culture adequate volume   Culture   Final    NO GROWTH 4 DAYS Performed at [facility], [place] [address] [place], [place] [address]    Report Status PENDING  Incomplete  Blood Culture (routine x 2)     Status: None (Preliminary result)   Collection Time: 02/15/18 12:39 PM  Result Value Ref Range Status   Specimen Description BLOOD RIGHT HAND  Final   Special Requests   Final    BOTTLES DRAWN AEROBIC ONLY Blood Culture results may not be optimal due to an inadequate volume of blood received in culture bottles   Culture   Final    NO GROWTH 4 DAYS Performed at [facility], [place] [address] [place], [place] [address]    Report Status PENDING  Incomplete  Urine culture     Status: Abnormal   Collection Time: 02/15/18  1:50 PM  Result Value Ref Range Status   Specimen Description URINE, RANDOM  Final   Special Requests   Final    NONE Performed at [facility], [place] [address] [place], [place] [address]    Culture >=100,000 COLONIES/mL PROTEUS MIRABILIS (A)  Final   Report Status 02/17/2018 FINAL  Final   Organism ID, Bacteria PROTEUS MIRABILIS (A)  Final      Susceptibility   Proteus mirabilis - MIC*    AMPICILLIN <=2 SENSITIVE Sensitive     CEFAZOLIN <=4 SENSITIVE Sensitive     CEFTRIAXONE <=1 SENSITIVE Sensitive     CIPROFLOXACIN <=0.25 SENSITIVE Sensitive     GENTAMICIN <=1 SENSITIVE Sensitive     IMIPENEM 4 SENSITIVE Sensitive     NITROFURANTOIN 128 RESISTANT Resistant     TRIMETH/SULFA <=20 SENSITIVE Sensitive  AMPICILLIN/SULBACTAM <=2 SENSITIVE Sensitive     PIP/TAZO <=4 SENSITIVE Sensitive     * >=100,000 COLONIES/mL PROTEUS MIRABILIS  MRSA PCR Screening     Status: None   Collection Time: 02/16/18 10:14 PM  Result Value Ref Range  Status   MRSA by PCR NEGATIVE NEGATIVE Final    Comment:        The GeneXpert MRSA Assay (FDA approved for NASAL specimens only), is one component of a comprehensive MRSA colonization surveillance program. It is not intended to diagnose MRSA infection nor to guide or monitor treatment for MRSA infections. Performed at [facility], [place] [address], [place], [place] [address]     RADIOLOGY STUDIES/RESULTS: Ct Head Wo Contrast  Result Date: 02/15/2018 CLINICAL DATA:  Altered mental status.  Apnea.  Known CNS lymphoma. EXAM: CT HEAD WITHOUT CONTRAST TECHNIQUE: Contiguous axial images were obtained from the base of the skull through the vertex without intravenous contrast. COMPARISON:  MRI head 09/05/2016.  CT head 08/27/2016 FINDINGS: Brain: Left occipital craniotomy for resection of posterior fossa tumor on the left. Hypodensity in the left middle cerebellar peduncle extending into the inferior cerebellum compatible with postop resection site. Hyperdense tissue lining the cavity could represent residual tumor. Overall the appearance appears similar to the prior MRI of 09/05/2016 Ventricle size normal.  No acute infarct, hemorrhage. Vascular: Negative for hyperdense vessel Skull: Left occipital craniotomy.  No acute abnormality. Sinuses/Orbits: Paranasal sinuses clear. Bilateral cataract surgery. Other: None IMPRESSION: No acute abnormality Postsurgical changes of tumor resection in the left posterior fossa. MRI with contrast would be necessary to evaluate for residual or recurrent tumor. Electronically Signed   By: Franchot Gallo M.D.   On: 02/15/2018 13:56   US Abdomen Complete  Result Date: 02/16/2018 CLINICAL DATA:  Pyelonephritis EXAM: ABDOMEN ULTRASOUND COMPLETE COMPARISON:  None. FINDINGS: Gallbladder: A negative sonographic Percell Miller sign was reported by the sonographer. There is cholelithiasis and a small amount of gallbladder sludge. No wall thickening. Common bile duct: Diameter: 2  mm Liver: There is a 1.2 cm cyst in the left lobe. Within normal limits in parenchymal echogenicity. Portal vein is patent on color Doppler imaging with normal direction of blood flow towards the liver. IVC: No abnormality visualized. Pancreas: Visualized portion unremarkable. Spleen: Size and appearance within normal limits. Right Kidney: Length: 12.0 cm. Echogenicity within normal limits. No mass or hydronephrosis visualized. Left Kidney: Length: 10.8 cm. Echogenicity within normal limits. No mass or hydronephrosis visualized. Abdominal aorta: No aneurysm visualized. Other findings: None. IMPRESSION: 1. Cholelithiasis without other evidence of acute cholecystitis. 2. Otherwise normal abdominal ultrasound. Electronically Signed   By: Ulyses Jarred M.D.   On: 02/16/2018 03:41   Dg Chest Port 1 View  Result Date: 02/16/2018 CLINICAL DATA:  Sepsis EXAM: PORTABLE CHEST 1 VIEW COMPARISON:  02/15/2018 FINDINGS: Right Port-A-Cath remains in place, unchanged. Mild cardiomegaly. No confluent airspace opacities or significant effusions. No acute bony abnormality. IMPRESSION: Cardiomegaly.  No active disease. Electronically Signed   By: Rolm Baptise M.D.   On: 02/16/2018 07:31   Dg Chest Port 1 View  Result Date: 02/15/2018 CLINICAL DATA:  Multiple syncopal episodes. EXAM: PORTABLE CHEST 1 VIEW COMPARISON:  Chest x-ray 09/04/2016 FINDINGS: The cardiac silhouette, mediastinal and hilar contours are within normal limits and stable. Coronary artery stents are noted. Right IJ Port-A-Cath appears to be in good position in the distal SVC. The end of the catheter appears somewhat irregular but this may be artifact from something on the patient's chest.  A dedicated two view chest x-ray may be helpful for further evaluation. The lungs are clear of an acute process. No infiltrates, edema or effusions. No pneumothorax. The bony thorax is intact. IMPRESSION: 1. No acute cardiopulmonary findings. 2. Somewhat irregular appearance  of the tip of the right IJ Port-A-Cath, possibly due to artifact. A repeat two-view chest x-ray when able may be helpful. Electronically Signed   By: Marijo Sanes M.D.   On: 02/15/2018 12:38     LOS: 4 days   Signature  Lala Lund M.D on 02/19/2018 at 10:33 AM   -  To page go to [email or web address] - password [email or web address]

## 2018-02-20 LAB — BASIC METABOLIC PANEL
Anion gap: 8 (ref 5–15)
BUN: 20 mg/dL (ref 8–23)
CO2: 26 mmol/L (ref 22–32)
Calcium: 7.9 mg/dL — ABNORMAL LOW (ref 8.9–10.3)
Chloride: 108 mmol/L (ref 98–111)
Creatinine, Ser: 0.65 mg/dL (ref 0.44–1.00)
GFR calc Af Amer: >60 mL/min (ref >60)
GFR calc non Af Amer: >60 mL/min (ref >60)
Glucose, Bld: 91 mg/dL (ref 70–99)
POTASSIUM: 3.3 mmol/L — AB (ref 3.5–5.1)
Sodium: 142 mmol/L (ref 135–145)

## 2018-02-20 LAB — GLUCOSE, CAPILLARY
GLUCOSE-CAPILLARY: 68 mg/dL — AB (ref 70–99)
GLUCOSE-CAPILLARY: 125 mg/dL — AB (ref 70–99)
Glucose-Capillary: 179 mg/dL — ABNORMAL HIGH (ref 70–99)
Glucose-Capillary: 82 mg/dL (ref 70–99)
Glucose-Capillary: 90 mg/dL (ref 70–99)
Glucose-Capillary: 94 mg/dL (ref 70–99)
Glucose-Capillary: 99 mg/dL (ref 70–99)

## 2018-02-20 LAB — CULTURE, BLOOD (ROUTINE X 2)
CULTURE: NO GROWTH
Culture: NO GROWTH
Special Requests: ADEQUATE

## 2018-02-20 LAB — MAGNESIUM: Magnesium: 2 mg/dL (ref 1.7–2.4)

## 2018-02-20 MED ORDER — POTASSIUM CHLORIDE CRYS ER 20 MEQ PO TBCR
40.0000 meq | EXTENDED_RELEASE_TABLET | Freq: Once | ORAL | Status: AC
  Administered 2018-02-20: 40 meq via ORAL
  Filled 2018-02-20: qty 2

## 2018-02-20 NOTE — Progress Notes (Signed)
PROGRESS NOTE        PATIENT DETAILS Name: Kimberly Vaughn Age: 72 y.o. Sex: female Date of Birth: 09/30/1945 Admit Date: 02/15/2018 Admitting Physician Kipp Brood, MD IWP:[id number], Chrys Racer, MD  Brief Narrative: Patient is a 72 y.o. 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  Patient was admitted to the intensive care unit and managed by PCCM, upon stability was transferred to the triad hospitalist service on 12/20.  Subjective:  Patient in bed, appears comfortable, denies any headache, no fever, no chest pain or pressure, no shortness of breath , no abdominal pain. No focal weakness.    Assessment/Plan:  Septic shock with neutropenic fever secondary to complicated Proteus UTI: Blood Cult -ve, sepsis pathophysiology has resolved, initially required IV vasopressors in the ICU.  Now transition to oral Keflex with a stop date of 02/23/2018.  Continue home dose Decadron for CNS lymphoma.  Advance activity.  Still quite weak likely will require placement.   Anemia: Combination of anemia of chronic disease along with some hemodilution from IV fluids.  No acute blood loss or signs of bleeding.  Outpatient PCP follow-up and monitor.    AKI: Due to sepsis.  Resolved after hydration.  PAF: Rate controlled-resume metoprolol & continue Xarelto.  History of CAD: No anginal symptoms.  Appears stable.  Suspect not on antiplatelet agents as patient is on anticoagulation, continue metoprolol and statin.  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  To new home dose Decadron.    DVT Prophylaxis: Full dose anticoagulation with Xarelto  Code Status:  DNR  Family Communication: Daughter over the phone  Disposition Plan: Remain  inpatient-CIR vs SNF on discharge  Antimicrobial agents: Anti-infectives (From admission, onward)   Start     Dose/Rate Route Frequency Ordered Stop   02/18/18 1400  cephALEXin (KEFLEX) capsule 500 mg     500 mg Oral Every 8 hours 02/18/18 1154 02/23/18 2359   02/17/18 1000  cefTRIAXone (ROCEPHIN) 2 g in sodium chloride 0.9 % 100 mL IVPB  Status:  Discontinued     2 g 200 mL/hr over 30 Minutes Intravenous Every 24 hours 02/17/18 0951 02/18/18 1154   02/17/18 0100  vancomycin (VANCOCIN) IVPB 1000 mg/200 mL premix  Status:  Discontinued     1,000 mg 200 mL/hr over 60 Minutes Intravenous Every 36 hours 02/15/18 1704 02/17/18 0951   02/16/18 1300  ceFEPIme (MAXIPIME) 2 g in sodium chloride 0.9 % 100 mL IVPB  Status:  Discontinued     2 g 200 mL/hr over 30 Minutes Intravenous Every 24 hours 02/15/18 1704 02/17/18 0951   02/15/18 1300  vancomycin (VANCOCIN) 1,250 mg in sodium chloride 0.9 % 250 mL IVPB     1,250 mg 166.7 mL/hr over 90 Minutes Intravenous  Once 02/15/18 1219 02/15/18 1446   02/15/18 1230  ceFEPIme (MAXIPIME) 2 g in sodium chloride 0.9 % 100 mL IVPB     2 g 200 mL/hr over 30 Minutes Intravenous  Once 02/15/18 1217 02/15/18 1323   02/15/18 1230  metroNIDAZOLE (FLAGYL) IVPB 500 mg  Status:  Discontinued     500 mg 100 mL/hr over 60 Minutes Intravenous Every 8 hours 02/15/18 1217 02/17/18 0951  02/15/18 1230  vancomycin (VANCOCIN) IVPB 1000 mg/200 mL premix  Status:  Discontinued     1,000 mg 200 mL/hr over 60 Minutes Intravenous  Once 02/15/18 1217 02/15/18 1219      Procedures: None  CONSULTS:  pulmonary/intensive care  Time spent: 25- minutes-Greater than 50% of this time was spent in counseling, explanation of diagnosis, planning of further management, and coordination of care.  MEDICATIONS: Scheduled Meds: . atorvastatin  80 mg Oral q1800  . cephALEXin  500 mg Oral Q8H  . cholecalciferol  2,000 Units Oral Daily  . dexamethasone  2 mg Oral BID WC  . insulin  aspart  1-3 Units Subcutaneous Q4H  . magnesium chloride  1 tablet Oral BID  . metoprolol tartrate  12.5 mg Oral BID  . pantoprazole  40 mg Oral Daily  . rivaroxaban  10 mg Oral Q supper  . tamsulosin  0.4 mg Oral Daily  . vitamin B-12  5,000 mcg Oral Daily   Continuous Infusions: . sodium chloride 10 mL/hr at 02/19/18 0758   PRN Meds:.Place/Maintain arterial line **AND** sodium chloride, acetaminophen, alum & mag hydroxide-simeth, eye wash, nitroGLYCERIN, sodium chloride flush   PHYSICAL EXAM: Vital signs: Vitals:   02/19/18 1330 02/19/18 2101 02/20/18 0410 02/20/18 0839  BP: 132/64 (!) 118/57 (!) 150/64 (!) 157/62  Pulse: 71 70 (!) 56 63  Resp: 20 20 18    Temp: 98.3 F (36.8 C) 97.6 F (36.4 C) 98.2 F (36.8 C)   TempSrc: Oral Oral Oral   SpO2: 98% 97% 99%   Weight:      Height:       Filed Weights   02/16/18 0500 02/17/18 0500 02/18/18 0500  Weight: 61.8 kg 60.9 kg 61 kg   Body mass index is 26.26 kg/m.   Exam  Awake Alert, Oriented X 3, No new F.N deficits, Old L.facial droop, Normal affect [place].AT,PERRAL Supple Neck,No JVD, No cervical lymphadenopathy appriciated.  Symmetrical Chest wall movement, Good air movement bilaterally, CTAB RRR,No Gallops, Rubs or new Murmurs, No Parasternal Heave +ve B.Sounds, Abd Soft, No tenderness, No organomegaly appriciated, No rebound - guarding or rigidity. No Cyanosis, Clubbing or edema, No new Rash or bruise   I have personally reviewed following labs and imaging studies  LABORATORY DATA: CBC: Recent Labs  Lab 02/15/18 1218 02/16/18 0216 02/17/18 0515 02/19/18 0936  WBC 2.0* 6.9 8.7 4.6  NEUTROABS 0.8*  --   --   --   HGB 10.8* 9.6* 8.4* 9.3*  HCT 36.8 30.5* 27.3* 30.1*  MCV 88.0 84.3 84.8 85.5  PLT 209 194 170 656    Basic Metabolic Panel: Recent Labs  Lab 02/15/18 1957 02/16/18 0216 02/17/18 0515 02/19/18 0936 02/20/18 0440  NA 137 137 139 138 142  K 3.1* 3.2* 3.0* 3.5 3.3*  CL 106 105 105 105 108    CO2 21* 21* 25 24 26   GLUCOSE 99 96 113* 107* 91  BUN 21 18 15  27* 20  CREATININE 1.01* 0.84 0.72 0.70 0.65  CALCIUM 7.8* 7.7* 7.8* 7.8* 7.9*  MG  --  1.3* 2.1  --  2.0  PHOS  --  3.2 2.3*  --   --     GFR: Estimated Creatinine Clearance: 50.4 mL/min (by C-G formula based on SCr of 0.65 mg/dL).  Liver Function Tests: Recent Labs  Lab 02/15/18 1218  AST 19  ALT 22  ALKPHOS 52  BILITOT 0.9  PROT 5.0*  ALBUMIN 3.0*   No results for input(s): LIPASE, AMYLASE  in the last 168 hours. No results for input(s): AMMONIA in the last 168 hours.  Coagulation Profile: No results for input(s): INR, PROTIME in the last 168 hours.  Cardiac Enzymes: Recent Labs  Lab 02/18/18 2137 02/19/18 0324 02/19/18 0936  TROPONINI 0.75* 0.54* 0.49*    BNP (last 3 results) No results for input(s): PROBNP in the last 8760 hours.  HbA1C: No results for input(s): HGBA1C in the last 72 hours.  CBG: Recent Labs  Lab 02/19/18 2057 02/20/18 0012 02/20/18 0408 02/20/18 0816 02/20/18 0852  GLUCAP 158* 179* 94 68* 82    Lipid Profile: No results for input(s): CHOL, HDL, LDLCALC, TRIG, CHOLHDL, LDLDIRECT in the last 72 hours.  Thyroid Function Tests: No results for input(s): TSH, T4TOTAL, FREET4, T3FREE, THYROIDAB in the last 72 hours.  Anemia Panel: No results for input(s): VITAMINB12, FOLATE, FERRITIN, TIBC, IRON, RETICCTPCT in the last 72 hours.  Urine analysis:    Component Value Date/Time   COLORURINE AMBER (A) 02/15/2018 1350   APPEARANCEUR HAZY (A) 02/15/2018 1350   LABSPEC 1.011 02/15/2018 1350   PHURINE 8.0 02/15/2018 1350   GLUCOSEU NEGATIVE 02/15/2018 1350   HGBUR NEGATIVE 02/15/2018 1350   BILIRUBINUR NEGATIVE 02/15/2018 1350   KETONESUR NEGATIVE 02/15/2018 1350   PROTEINUR 30 (A) 02/15/2018 1350   NITRITE POSITIVE (A) 02/15/2018 1350   LEUKOCYTESUR SMALL (A) 02/15/2018 1350    Sepsis Labs: Lactic Acid, Venous    Component Value Date/Time   LATICACIDVEN 2.2 ([place])  02/15/2018 1957    MICROBIOLOGY: Recent Results (from the past 240 hour(s))  Blood Culture (routine x 2)     Status: None   Collection Time: 02/15/18 12:30 PM  Result Value Ref Range Status   Specimen Description BLOOD LEFT ANTECUBITAL  Final   Special Requests   Final    BOTTLES DRAWN AEROBIC AND ANAEROBIC Blood Culture adequate volume   Culture   Final    NO GROWTH 5 DAYS Performed at [facility], [place] [address], [place], [place] [address]    Report Status 02/20/2018 FINAL  Final  Blood Culture (routine x 2)     Status: None   Collection Time: 02/15/18 12:39 PM  Result Value Ref Range Status   Specimen Description BLOOD RIGHT HAND  Final   Special Requests   Final    BOTTLES DRAWN AEROBIC ONLY Blood Culture results may not be optimal due to an inadequate volume of blood received in culture bottles   Culture   Final    NO GROWTH 5 DAYS Performed at [facility], [place] [address] [place], [place] [address]    Report Status 02/20/2018 FINAL  Final  Urine culture     Status: Abnormal   Collection Time: 02/15/18  1:50 PM  Result Value Ref Range Status   Specimen Description URINE, RANDOM  Final   Special Requests   Final    NONE Performed at [facility], [place] [address], [place], [place] [address]    Culture >=100,000 COLONIES/mL PROTEUS MIRABILIS (A)  Final   Report Status 02/17/2018 FINAL  Final   Organism ID, Bacteria PROTEUS MIRABILIS (A)  Final      Susceptibility   Proteus mirabilis - MIC*    AMPICILLIN <=2 SENSITIVE Sensitive     CEFAZOLIN <=4 SENSITIVE Sensitive     CEFTRIAXONE <=1 SENSITIVE Sensitive     CIPROFLOXACIN <=0.25 SENSITIVE Sensitive     GENTAMICIN <=1 SENSITIVE Sensitive     IMIPENEM 4 SENSITIVE Sensitive  NITROFURANTOIN 128 RESISTANT Resistant     TRIMETH/SULFA <=20 SENSITIVE Sensitive     AMPICILLIN/SULBACTAM <=2 SENSITIVE Sensitive     PIP/TAZO <=4 SENSITIVE Sensitive     * >=100,000 COLONIES/mL PROTEUS MIRABILIS  MRSA PCR  Screening     Status: None   Collection Time: 02/16/18 10:14 PM  Result Value Ref Range Status   MRSA by PCR NEGATIVE NEGATIVE Final    Comment:        The GeneXpert MRSA Assay (FDA approved for NASAL specimens only), is one component of a comprehensive MRSA colonization surveillance program. It is not intended to diagnose MRSA infection nor to guide or monitor treatment for MRSA infections. Performed at [facility], [place] [address] [place], [place] [address]     RADIOLOGY STUDIES/RESULTS: Ct Head Wo Contrast  Result Date: 02/15/2018 CLINICAL DATA:  Altered mental status.  Apnea.  Known CNS lymphoma. EXAM: CT HEAD WITHOUT CONTRAST TECHNIQUE: Contiguous axial images were obtained from the base of the skull through the vertex without intravenous contrast. COMPARISON:  MRI head 09/05/2016.  CT head 08/27/2016 FINDINGS: Brain: Left occipital craniotomy for resection of posterior fossa tumor on the left. Hypodensity in the left middle cerebellar peduncle extending into the inferior cerebellum compatible with postop resection site. Hyperdense tissue lining the cavity could represent residual tumor. Overall the appearance appears similar to the prior MRI of 09/05/2016 Ventricle size normal.  No acute infarct, hemorrhage. Vascular: Negative for hyperdense vessel Skull: Left occipital craniotomy.  No acute abnormality. Sinuses/Orbits: Paranasal sinuses clear. Bilateral cataract surgery. Other: None IMPRESSION: No acute abnormality Postsurgical changes of tumor resection in the left posterior fossa. MRI with contrast would be necessary to evaluate for residual or recurrent tumor. Electronically Signed   By: Franchot Gallo M.D.   On: 02/15/2018 13:56   US Abdomen Complete  Result Date: 02/16/2018 CLINICAL DATA:  Pyelonephritis EXAM: ABDOMEN ULTRASOUND COMPLETE COMPARISON:  None. FINDINGS: Gallbladder: A negative sonographic Percell Miller sign was reported by the sonographer. There is cholelithiasis  and a small amount of gallbladder sludge. No wall thickening. Common bile duct: Diameter: 2 mm Liver: There is a 1.2 cm cyst in the left lobe. Within normal limits in parenchymal echogenicity. Portal vein is patent on color Doppler imaging with normal direction of blood flow towards the liver. IVC: No abnormality visualized. Pancreas: Visualized portion unremarkable. Spleen: Size and appearance within normal limits. Right Kidney: Length: 12.0 cm. Echogenicity within normal limits. No mass or hydronephrosis visualized. Left Kidney: Length: 10.8 cm. Echogenicity within normal limits. No mass or hydronephrosis visualized. Abdominal aorta: No aneurysm visualized. Other findings: None. IMPRESSION: 1. Cholelithiasis without other evidence of acute cholecystitis. 2. Otherwise normal abdominal ultrasound. Electronically Signed   By: Ulyses Jarred M.D.   On: 02/16/2018 03:41   Dg Chest Port 1 View  Result Date: 02/16/2018 CLINICAL DATA:  Sepsis EXAM: PORTABLE CHEST 1 VIEW COMPARISON:  02/15/2018 FINDINGS: Right Port-A-Cath remains in place, unchanged. Mild cardiomegaly. No confluent airspace opacities or significant effusions. No acute bony abnormality. IMPRESSION: Cardiomegaly.  No active disease. Electronically Signed   By: Rolm Baptise M.D.   On: 02/16/2018 07:31   Dg Chest Port 1 View  Result Date: 02/15/2018 CLINICAL DATA:  Multiple syncopal episodes. EXAM: PORTABLE CHEST 1 VIEW COMPARISON:  Chest x-ray 09/04/2016 FINDINGS: The cardiac silhouette, mediastinal and hilar contours are within normal limits and stable. Coronary artery stents are noted. Right IJ Port-A-Cath appears to be in good position in the distal SVC. The end of  the catheter appears somewhat irregular but this may be artifact from something on the patient's chest. A dedicated two view chest x-ray may be helpful for further evaluation. The lungs are clear of an acute process. No infiltrates, edema or effusions. No pneumothorax. The bony thorax  is intact. IMPRESSION: 1. No acute cardiopulmonary findings. 2. Somewhat irregular appearance of the tip of the right IJ Port-A-Cath, possibly due to artifact. A repeat two-view chest x-ray when able may be helpful. Electronically Signed   By: Marijo Sanes M.D.   On: 02/15/2018 12:38     LOS: 5 days   Signature  Lala Lund M.D on 02/20/2018 at 9:20 AM   -  To page go to [email or web address] - password [facility]

## 2018-02-20 NOTE — Progress Notes (Signed)
Hypoglycemic Event  CBG:68  Treatment: 8 oz juice/soda  Symptoms: None  Follow-up CBG: [id number] CBG Result:82  Possible Reasons for Event: Unknown    Luci Bank

## 2018-02-21 DIAGNOSIS — R5381 Other malaise: Secondary | ICD-10-CM

## 2018-02-21 LAB — GLUCOSE, CAPILLARY
Glucose-Capillary: 109 mg/dL — ABNORMAL HIGH (ref 70–99)
Glucose-Capillary: 130 mg/dL — ABNORMAL HIGH (ref 70–99)
Glucose-Capillary: 77 mg/dL (ref 70–99)
Glucose-Capillary: 83 mg/dL (ref 70–99)
Glucose-Capillary: 90 mg/dL (ref 70–99)
Glucose-Capillary: 99 mg/dL (ref 70–99)

## 2018-02-21 MED ORDER — SODIUM CHLORIDE 0.9 % IV SOLN
INTRAVENOUS | Status: DC | PRN
  Administered 2018-02-21: 100 mL via INTRAVENOUS

## 2018-02-21 NOTE — Progress Notes (Signed)
CSW requested [facility] initiate SNF authorization since CIR was denied. Unsure how long this will take with the holiday office schedule.   Percell Locus Deng Kemler LCSW [phone]

## 2018-02-21 NOTE — Progress Notes (Signed)
Inpatient Rehabilitation-Admissions Coordinator   [facility] has been notified that pt's insurance has denied the request for CIR. AC has spoken with pt and her daughter who are open to SNF placement. AC has spoken with SW regarding need for new dispo plans.   AC will sign off. Please call if questions.   Kimberly Vaughn, OTR/L  Rehab Admissions Coordinator  [phone] 02/21/2018 5:14 PM

## 2018-02-21 NOTE — Progress Notes (Signed)
Physical Therapy Treatment Patient Details Name: Kimberly Vaughn MRN: [id number] DOB: 03-13-1945 Today's Date: 02/21/2018    History of Present Illness 72 year old woman who was in her usual state of health until this morning when she has several presyncopal episodes at home with an episode of unresponsiveness and possible shaking, thought to be adrenal insufficiency. Patient with dx of lymphoma in 2018, recent treatment at [facility]. PMH includes but not limited to. A Fib, CAD, DVT, HTN, Brain Tumor s/p resection 2018, C spine surgery.     PT Comments    Patient seen for mobility progression - very motivated to participate. Patient requiring light Min A for transfers and mobility with RW - L LE demonstrating poor heel strike with altered gait mechanics - will continue to recommend CIR at discharge to progress safe and independent functional mobility.     Follow Up Recommendations  CIR     Equipment Recommendations  None recommended by PT    Recommendations for Other Services Speech consult     Precautions / Restrictions Precautions Precautions: Fall Precaution Comments: Watch BP Restrictions Weight Bearing Restrictions: No    Mobility  Bed Mobility               General bed mobility comments: OOB in chair at entry  Transfers Overall transfer level: Needs assistance Equipment used: Rolling walker (2 wheeled) Transfers: Sit to/from Stand Sit to Stand: Min assist;Min guard         General transfer comment: light Min A to rise from recliner x 3  Ambulation/Gait Ambulation/Gait assistance: Min assist Gait Distance (Feet): 60 Feet(2 reps ) Assistive device: Rolling walker (2 wheeled) Gait Pattern/deviations: Step-through pattern;Decreased stride length;Trunk flexed Gait velocity: decreased   General Gait Details: difficulty with L heel strike - poor weight shift to L LE as well; Min A for stability and RW Statistician    Modified Rankin (Stroke Patients Only)       Balance Overall balance assessment: Needs assistance Sitting-balance support: No upper extremity supported;Feet supported Sitting balance-Leahy Scale: Good     Standing balance support: Bilateral upper extremity supported;During functional activity Standing balance-Leahy Scale: Poor Standing balance comment: reliant on external support                            Cognition Arousal/Alertness: Awake/alert Behavior During Therapy: WFL for tasks assessed/performed Overall Cognitive Status: Within Functional Limits for tasks assessed                                        Exercises      General Comments        Pertinent Vitals/Pain Pain Assessment: No/denies pain    Home Living                      Prior Function            PT Goals (current goals can now be found in the care plan section) Acute Rehab PT Goals Patient Stated Goal: return home when able PT Goal Formulation: With patient/family Time For Goal Achievement: 03/02/18 Potential to Achieve Goals: Fair Progress towards PT goals: Progressing toward goals    Frequency    Min 3X/week      PT  Plan Current plan remains appropriate    Co-evaluation              AM-PAC PT "6 Clicks" Mobility   Outcome Measure  Help needed turning from your back to your side while in a flat bed without using bedrails?: None Help needed moving from lying on your back to sitting on the side of a flat bed without using bedrails?: A Little Help needed moving to and from a bed to a chair (including a wheelchair)?: A Little Help needed standing up from a chair using your arms (e.g., wheelchair or bedside chair)?: A Little Help needed to walk in hospital room?: A Little Help needed climbing 3-5 steps with a railing? : A Little 6 Click Score: 19    End of Session Equipment Utilized During Treatment: Gait belt Activity  Tolerance: Patient tolerated treatment well Patient left: in chair;with call bell/phone within reach;with family/visitor present Nurse Communication: Mobility status PT Visit Diagnosis: Unsteadiness on feet (R26.81)     Time: 9672-8979 PT Time Calculation (min) (ACUTE ONLY): 17 min  Charges:  $Gait Training: 8-22 mins                     Lanney Gins, PT, DPT Supplemental Physical Therapist 02/21/18 1:14 PM Pager: [phone] Office: [phone]

## 2018-02-21 NOTE — Progress Notes (Signed)
PROGRESS NOTE        PATIENT DETAILS Name: Kimberly Vaughn Age: 72 y.o. Sex: female Date of Birth: Jul 13, 1945 Admit Date: 02/15/2018 Admitting Physician Kipp Brood, MD HFW:YOVZCHYI, Chrys Racer, MD  Brief Narrative: Patient is a 72 y.o. 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  Patient was admitted to the intensive care unit and managed by PCCM, upon stability was transferred to the triad hospitalist service on 12/20.  Subjective:  Patient in bed, appears comfortable, denies any headache, no fever, no chest pain or pressure, no shortness of breath , no abdominal pain. No focal weakness.    Assessment/Plan:  Septic shock with neutropenic fever secondary to complicated Proteus UTI: Blood Cult -ve, sepsis pathophysiology has resolved, initially required IV vasopressors in the ICU.  Now transition to oral Keflex with a stop date of 02/23/2018.  Continue home dose Decadron for CNS lymphoma.  Advance activity.  Still quite weak likely will require placement.   Anemia: Combination of anemia of chronic disease along with some hemodilution from IV fluids.  No acute blood loss or signs of bleeding.  Outpatient PCP follow-up and monitor.    AKI: Due to sepsis.  Resolved after hydration.  PAF: Rate controlled-resume metoprolol & continue Xarelto.  History of CAD: No anginal symptoms.  Appears stable.  Suspect not on antiplatelet agents as patient is on anticoagulation, continue metoprolol and statin.  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  To new home dose Decadron.  Hypokalemia.  Has been replaced.    DVT Prophylaxis: Full dose anticoagulation with Xarelto  Code Status:  DNR  Family Communication: Daughter over the  phone  Disposition Plan: Remain inpatient-CIR vs SNF on discharge  Antimicrobial agents: Anti-infectives (From admission, onward)   Start     Dose/Rate Route Frequency Ordered Stop   02/18/18 1400  cephALEXin (KEFLEX) capsule 500 mg     500 mg Oral Every 8 hours 02/18/18 1154 02/23/18 2359   02/17/18 1000  cefTRIAXone (ROCEPHIN) 2 g in sodium chloride 0.9 % 100 mL IVPB  Status:  Discontinued     2 g 200 mL/hr over 30 Minutes Intravenous Every 24 hours 02/17/18 0951 02/18/18 1154   02/17/18 0100  vancomycin (VANCOCIN) IVPB 1000 mg/200 mL premix  Status:  Discontinued     1,000 mg 200 mL/hr over 60 Minutes Intravenous Every 36 hours 02/15/18 1704 02/17/18 0951   02/16/18 1300  ceFEPIme (MAXIPIME) 2 g in sodium chloride 0.9 % 100 mL IVPB  Status:  Discontinued     2 g 200 mL/hr over 30 Minutes Intravenous Every 24 hours 02/15/18 1704 02/17/18 0951   02/15/18 1300  vancomycin (VANCOCIN) 1,250 mg in sodium chloride 0.9 % 250 mL IVPB     1,250 mg 166.7 mL/hr over 90 Minutes Intravenous  Once 02/15/18 1219 02/15/18 1446   02/15/18 1230  ceFEPIme (MAXIPIME) 2 g in sodium chloride 0.9 % 100 mL IVPB     2 g 200 mL/hr over 30 Minutes Intravenous  Once 02/15/18 1217 02/15/18 1323   02/15/18 1230  metroNIDAZOLE (FLAGYL) IVPB 500 mg  Status:  Discontinued     500 mg 100 mL/hr over 60 Minutes Intravenous Every  8 hours 02/15/18 1217 02/17/18 0951   02/15/18 1230  vancomycin (VANCOCIN) IVPB 1000 mg/200 mL premix  Status:  Discontinued     1,000 mg 200 mL/hr over 60 Minutes Intravenous  Once 02/15/18 1217 02/15/18 1219      Procedures: None  CONSULTS:  pulmonary/intensive care  Time spent: 25- minutes-Greater than 50% of this time was spent in counseling, explanation of diagnosis, planning of further management, and coordination of care.  MEDICATIONS: Scheduled Meds: . atorvastatin  80 mg Oral q1800  . cephALEXin  500 mg Oral Q8H  . cholecalciferol  2,000 Units Oral Daily  .  dexamethasone  2 mg Oral BID WC  . insulin aspart  1-3 Units Subcutaneous Q4H  . magnesium chloride  1 tablet Oral BID  . metoprolol tartrate  12.5 mg Oral BID  . pantoprazole  40 mg Oral Daily  . rivaroxaban  10 mg Oral Q supper  . tamsulosin  0.4 mg Oral Daily  . vitamin B-12  5,000 mcg Oral Daily   Continuous Infusions: . sodium chloride 10 mL/hr at 02/19/18 0758   PRN Meds:.Place/Maintain arterial line **AND** sodium chloride, acetaminophen, alum & mag hydroxide-simeth, eye wash, nitroGLYCERIN, sodium chloride flush   PHYSICAL EXAM: Vital signs: Vitals:   02/20/18 0839 02/20/18 2021 02/21/18 0514 02/21/18 0845  BP: (!) 157/62 122/61 (!) 152/79 (!) 142/78  Pulse: 63 64 66 65  Resp:  18 18   Temp:  98.1 F (36.7 C) 98.7 F (37.1 C)   TempSrc:  Oral Oral   SpO2:  98% 97%   Weight:      Height:       Filed Weights   02/16/18 0500 02/17/18 0500 02/18/18 0500  Weight: 61.8 kg 60.9 kg 61 kg   Body mass index is 26.26 kg/m.   Exam  Awake Alert, Oriented X 3, No new F.N deficits, Chronic L. Facial droop, Normal affect [place].AT,PERRAL Supple Neck,No JVD, No cervical lymphadenopathy appriciated.  Symmetrical Chest wall movement, Good air movement bilaterally, CTAB RRR,No Gallops, Rubs or new Murmurs, No Parasternal Heave +ve B.Sounds, Abd Soft, No tenderness, No organomegaly appriciated, No rebound - guarding or rigidity. No Cyanosis, Clubbing or edema, No new Rash or bruise    I have personally reviewed following labs and imaging studies  LABORATORY DATA: CBC: Recent Labs  Lab 02/15/18 1218 02/16/18 0216 02/17/18 0515 02/19/18 0936  WBC 2.0* 6.9 8.7 4.6  NEUTROABS 0.8*  --   --   --   HGB 10.8* 9.6* 8.4* 9.3*  HCT 36.8 30.5* 27.3* 30.1*  MCV 88.0 84.3 84.8 85.5  PLT 209 194 170 169    Basic Metabolic Panel: Recent Labs  Lab 02/15/18 1957 02/16/18 0216 02/17/18 0515 02/19/18 0936 02/20/18 0440  NA 137 137 139 138 142  K 3.1* 3.2* 3.0* 3.5 3.3*  CL  106 105 105 105 108  CO2 21* 21* 25 24 26   GLUCOSE 99 96 113* 107* 91  BUN 21 18 15  27* 20  CREATININE 1.01* 0.84 0.72 0.70 0.65  CALCIUM 7.8* 7.7* 7.8* 7.8* 7.9*  MG  --  1.3* 2.1  --  2.0  PHOS  --  3.2 2.3*  --   --     GFR: Estimated Creatinine Clearance: 50.4 mL/min (by C-G formula based on SCr of 0.65 mg/dL).  Liver Function Tests: Recent Labs  Lab 02/15/18 1218  AST 19  ALT 22  ALKPHOS 52  BILITOT 0.9  PROT 5.0*  ALBUMIN 3.0*   No  results for input(s): LIPASE, AMYLASE in the last 168 hours. No results for input(s): AMMONIA in the last 168 hours.  Coagulation Profile: No results for input(s): INR, PROTIME in the last 168 hours.  Cardiac Enzymes: Recent Labs  Lab 02/18/18 2137 02/19/18 0324 02/19/18 0936  TROPONINI 0.75* 0.54* 0.49*    BNP (last 3 results) No results for input(s): PROBNP in the last 8760 hours.  HbA1C: No results for input(s): HGBA1C in the last 72 hours.  CBG: Recent Labs  Lab 02/20/18 1625 02/20/18 2019 02/21/18 0016 02/21/18 0511 02/21/18 0813  GLUCAP 90 125* 130* 90 77    Lipid Profile: No results for input(s): CHOL, HDL, LDLCALC, TRIG, CHOLHDL, LDLDIRECT in the last 72 hours.  Thyroid Function Tests: No results for input(s): TSH, T4TOTAL, FREET4, T3FREE, THYROIDAB in the last 72 hours.  Anemia Panel: No results for input(s): VITAMINB12, FOLATE, FERRITIN, TIBC, IRON, RETICCTPCT in the last 72 hours.  Urine analysis:    Component Value Date/Time   COLORURINE AMBER (A) 02/15/2018 1350   APPEARANCEUR HAZY (A) 02/15/2018 1350   LABSPEC 1.011 02/15/2018 1350   PHURINE 8.0 02/15/2018 1350   GLUCOSEU NEGATIVE 02/15/2018 1350   HGBUR NEGATIVE 02/15/2018 1350   BILIRUBINUR NEGATIVE 02/15/2018 1350   KETONESUR NEGATIVE 02/15/2018 1350   PROTEINUR 30 (A) 02/15/2018 1350   NITRITE POSITIVE (A) 02/15/2018 1350   LEUKOCYTESUR SMALL (A) 02/15/2018 1350    Sepsis Labs: Lactic Acid, Venous    Component Value Date/Time    LATICACIDVEN 2.2 ([place]) 02/15/2018 1957    MICROBIOLOGY: Recent Results (from the past 240 hour(s))  Blood Culture (routine x 2)     Status: None   Collection Time: 02/15/18 12:30 PM  Result Value Ref Range Status   Specimen Description BLOOD LEFT ANTECUBITAL  Final   Special Requests   Final    BOTTLES DRAWN AEROBIC AND ANAEROBIC Blood Culture adequate volume   Culture   Final    NO GROWTH 5 DAYS Performed at [facility], [place] [address], [place], [place] [address]    Report Status 02/20/2018 FINAL  Final  Blood Culture (routine x 2)     Status: None   Collection Time: 02/15/18 12:39 PM  Result Value Ref Range Status   Specimen Description BLOOD RIGHT HAND  Final   Special Requests   Final    BOTTLES DRAWN AEROBIC ONLY Blood Culture results may not be optimal due to an inadequate volume of blood received in culture bottles   Culture   Final    NO GROWTH 5 DAYS Performed at [facility], [place] [address] [place], [place] [address]    Report Status 02/20/2018 FINAL  Final  Urine culture     Status: Abnormal   Collection Time: 02/15/18  1:50 PM  Result Value Ref Range Status   Specimen Description URINE, RANDOM  Final   Special Requests   Final    NONE Performed at [facility], [place] [address] [place], [place] [address]    Culture >=100,000 COLONIES/mL PROTEUS MIRABILIS (A)  Final   Report Status 02/17/2018 FINAL  Final   Organism ID, Bacteria PROTEUS MIRABILIS (A)  Final      Susceptibility   Proteus mirabilis - MIC*    AMPICILLIN <=2 SENSITIVE Sensitive     CEFAZOLIN <=4 SENSITIVE Sensitive     CEFTRIAXONE <=1 SENSITIVE Sensitive     CIPROFLOXACIN <=0.25 SENSITIVE Sensitive     GENTAMICIN <=1 SENSITIVE Sensitive  IMIPENEM 4 SENSITIVE Sensitive     NITROFURANTOIN 128 RESISTANT Resistant     TRIMETH/SULFA <=20 SENSITIVE Sensitive     AMPICILLIN/SULBACTAM <=2 SENSITIVE Sensitive     PIP/TAZO <=4 SENSITIVE Sensitive     * >=100,000 COLONIES/mL PROTEUS  MIRABILIS  MRSA PCR Screening     Status: None   Collection Time: 02/16/18 10:14 PM  Result Value Ref Range Status   MRSA by PCR NEGATIVE NEGATIVE Final    Comment:        The GeneXpert MRSA Assay (FDA approved for NASAL specimens only), is one component of a comprehensive MRSA colonization surveillance program. It is not intended to diagnose MRSA infection nor to guide or monitor treatment for MRSA infections. Performed at [facility], [place] [address], [place], [place] [address]     RADIOLOGY STUDIES/RESULTS: Ct Head Wo Contrast  Result Date: 02/15/2018 CLINICAL DATA:  Altered mental status.  Apnea.  Known CNS lymphoma. EXAM: CT HEAD WITHOUT CONTRAST TECHNIQUE: Contiguous axial images were obtained from the base of the skull through the vertex without intravenous contrast. COMPARISON:  MRI head 09/05/2016.  CT head 08/27/2016 FINDINGS: Brain: Left occipital craniotomy for resection of posterior fossa tumor on the left. Hypodensity in the left middle cerebellar peduncle extending into the inferior cerebellum compatible with postop resection site. Hyperdense tissue lining the cavity could represent residual tumor. Overall the appearance appears similar to the prior MRI of 09/05/2016 Ventricle size normal.  No acute infarct, hemorrhage. Vascular: Negative for hyperdense vessel Skull: Left occipital craniotomy.  No acute abnormality. Sinuses/Orbits: Paranasal sinuses clear. Bilateral cataract surgery. Other: None IMPRESSION: No acute abnormality Postsurgical changes of tumor resection in the left posterior fossa. MRI with contrast would be necessary to evaluate for residual or recurrent tumor. Electronically Signed   By: Franchot Gallo M.D.   On: 02/15/2018 13:56   US Abdomen Complete  Result Date: 02/16/2018 CLINICAL DATA:  Pyelonephritis EXAM: ABDOMEN ULTRASOUND COMPLETE COMPARISON:  None. FINDINGS: Gallbladder: A negative sonographic Percell Miller sign was reported by the sonographer.  There is cholelithiasis and a small amount of gallbladder sludge. No wall thickening. Common bile duct: Diameter: 2 mm Liver: There is a 1.2 cm cyst in the left lobe. Within normal limits in parenchymal echogenicity. Portal vein is patent on color Doppler imaging with normal direction of blood flow towards the liver. IVC: No abnormality visualized. Pancreas: Visualized portion unremarkable. Spleen: Size and appearance within normal limits. Right Kidney: Length: 12.0 cm. Echogenicity within normal limits. No mass or hydronephrosis visualized. Left Kidney: Length: 10.8 cm. Echogenicity within normal limits. No mass or hydronephrosis visualized. Abdominal aorta: No aneurysm visualized. Other findings: None. IMPRESSION: 1. Cholelithiasis without other evidence of acute cholecystitis. 2. Otherwise normal abdominal ultrasound. Electronically Signed   By: Ulyses Jarred M.D.   On: 02/16/2018 03:41   Dg Chest Port 1 View  Result Date: 02/16/2018 CLINICAL DATA:  Sepsis EXAM: PORTABLE CHEST 1 VIEW COMPARISON:  02/15/2018 FINDINGS: Right Port-A-Cath remains in place, unchanged. Mild cardiomegaly. No confluent airspace opacities or significant effusions. No acute bony abnormality. IMPRESSION: Cardiomegaly.  No active disease. Electronically Signed   By: Rolm Baptise M.D.   On: 02/16/2018 07:31   Dg Chest Port 1 View  Result Date: 02/15/2018 CLINICAL DATA:  Multiple syncopal episodes. EXAM: PORTABLE CHEST 1 VIEW COMPARISON:  Chest x-ray 09/04/2016 FINDINGS: The cardiac silhouette, mediastinal and hilar contours are within normal limits and stable. Coronary artery stents are noted. Right IJ Port-A-Cath appears to be in good  position in the distal SVC. The end of the catheter appears somewhat irregular but this may be artifact from something on the patient's chest. A dedicated two view chest x-ray may be helpful for further evaluation. The lungs are clear of an acute process. No infiltrates, edema or effusions. No  pneumothorax. The bony thorax is intact. IMPRESSION: 1. No acute cardiopulmonary findings. 2. Somewhat irregular appearance of the tip of the right IJ Port-A-Cath, possibly due to artifact. A repeat two-view chest x-ray when able may be helpful. Electronically Signed   By: Marijo Sanes M.D.   On: 02/15/2018 12:38     LOS: 6 days   Signature  Lala Lund M.D on 02/21/2018 at 9:06 AM   -  To page go to [email or web address] - password [email or web address]

## 2018-02-21 NOTE — Progress Notes (Signed)
[facility] PHYSICAL MEDICINE & REHABILITATION PROGRESS NOTE  Subjective/Complaints: Patient seen sitting up in her chair.  Family at bedside.  She states she slept well overnight.  She notes some improvement in strength, but persistent weakness.  ROS: Denies CP, SOB, nausea, vomiting, diarrhea.  Objective: Vital Signs: Blood pressure 127/60, pulse 66, temperature 98.3 F (36.8 C), temperature source Oral, resp. rate 20, height 5' (1.524 m), weight 61 kg, SpO2 99 %. No results found. Recent Labs    02/19/18 0936  WBC 4.6  HGB 9.3*  HCT 30.1*  PLT 162   Recent Labs    02/19/18 0936 02/20/18 0440  NA 138 142  K 3.5 3.3*  CL 105 108  CO2 24 26  GLUCOSE 107* 91  BUN 27* 20  CREATININE 0.70 0.65  CALCIUM 7.8* 7.9*    Physical Exam: BP 127/60 (BP Location: Left Arm)   Pulse 66   Temp 98.3 F (36.8 C) (Oral)   Resp 20   Ht 5' (1.524 m)   Wt 61 kg   LMP  (LMP Unknown)   SpO2 99%   BMI 26.26 kg/m  Constitutional: No distress . Vital signs reviewed. HENT: Normocephalic.  Atraumatic. Eyes: EOMI. No discharge. Cardiovascular: RRR. No JVD. Respiratory: CTA Bilaterally. Normal effort. GI: BS +. Non-distended. Musc: No edema or tenderness in extremities. Neurological: She is alert and oriented to person, place, and time.  Left facial weakness Motor: Bilateral upper extremities: 4+/5 proximal distal Left lower extremity: 4-/5 proximal distal Right lower extremity: 4+/5 proximal to distal Sensation diminished to light touch left lower extremity  Skin: Skin is warm and dry.  Psychiatric: She has a normal mood and affect. Her behavior is normal.   Assessment/Plan:  1.  Debility  Continue therapies  Patient denied by insurance, if adequate caregiver support not available at discharge recommend SNF.  2.  Hypokalemia  Repeat labs  Continue to monitor and replete as necessary  LOS: 6 days A FACE TO FACE EVALUATION WAS PERFORMED  Ankit Lorie Phenix 02/21/2018, 4:30  PM

## 2018-02-22 DIAGNOSIS — R5381 Other malaise: Secondary | ICD-10-CM

## 2018-02-22 DIAGNOSIS — E876 Hypokalemia: Secondary | ICD-10-CM

## 2018-02-22 LAB — GLUCOSE, CAPILLARY
GLUCOSE-CAPILLARY: 102 mg/dL — AB (ref 70–99)
Glucose-Capillary: 111 mg/dL — ABNORMAL HIGH (ref 70–99)
Glucose-Capillary: 75 mg/dL (ref 70–99)
Glucose-Capillary: 64 mg/dL — ABNORMAL LOW (ref 70–99)
Glucose-Capillary: 145 mg/dL — ABNORMAL HIGH (ref 70–99)
Glucose-Capillary: 99 mg/dL (ref 70–99)
Glucose-Capillary: 105 mg/dL — ABNORMAL HIGH (ref 70–99)
Glucose-Capillary: 167 mg/dL — ABNORMAL HIGH (ref 70–99)

## 2018-02-22 NOTE — Progress Notes (Signed)
PROGRESS NOTE        PATIENT DETAILS Name: Kimberly Vaughn Age: 72 y.o. Sex: female Date of Birth: 02-24-1946 Admit Date: 02/15/2018 Admitting Physician Kipp Brood, MD YOV:ZCHYIFOY, Chrys Racer, MD  Brief Narrative: Patient is a 73 y.o. 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  Patient was admitted to the intensive care unit and managed by PCCM, upon stability was transferred to the triad hospitalist service on 12/20.  Subjective:  Patient in bed, appears comfortable, denies any headache, no fever, no chest pain or pressure, no shortness of breath , no abdominal pain. No focal weakness.     Assessment/Plan:  Septic shock with neutropenic fever secondary to complicated Proteus UTI: Blood Cult -ve, sepsis pathophysiology has resolved, initially required IV vasopressors in the ICU.  Now transition to oral Keflex with a stop date of 02/23/2018.  Continue home dose Decadron for CNS lymphoma.  Advance activity.  Still quite weak likely will require placement.   Anemia: Combination of anemia of chronic disease along with some hemodilution from IV fluids.  No acute blood loss or signs of bleeding.  Outpatient PCP follow-up and monitor.    AKI: Due to sepsis.  Resolved after hydration.  PAF: Rate controlled-resume metoprolol & continue Xarelto.  History of CAD: No anginal symptoms.  Appears stable.  Suspect not on antiplatelet agents as patient is on anticoagulation, continue metoprolol and statin.  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  To new home dose Decadron.  Hypokalemia.  Has been replaced.    DVT Prophylaxis: Full dose anticoagulation with Xarelto  Code Status:  DNR  Family Communication: Daughter over the  phone  Disposition Plan: Remain inpatient-CIR vs SNF on discharge  Antimicrobial agents: Anti-infectives (From admission, onward)   Start     Dose/Rate Route Frequency Ordered Stop   02/18/18 1400  cephALEXin (KEFLEX) capsule 500 mg     500 mg Oral Every 8 hours 02/18/18 1154 02/23/18 2359   02/17/18 1000  cefTRIAXone (ROCEPHIN) 2 g in sodium chloride 0.9 % 100 mL IVPB  Status:  Discontinued     2 g 200 mL/hr over 30 Minutes Intravenous Every 24 hours 02/17/18 0951 02/18/18 1154   02/17/18 0100  vancomycin (VANCOCIN) IVPB 1000 mg/200 mL premix  Status:  Discontinued     1,000 mg 200 mL/hr over 60 Minutes Intravenous Every 36 hours 02/15/18 1704 02/17/18 0951   02/16/18 1300  ceFEPIme (MAXIPIME) 2 g in sodium chloride 0.9 % 100 mL IVPB  Status:  Discontinued     2 g 200 mL/hr over 30 Minutes Intravenous Every 24 hours 02/15/18 1704 02/17/18 0951   02/15/18 1300  vancomycin (VANCOCIN) 1,250 mg in sodium chloride 0.9 % 250 mL IVPB     1,250 mg 166.7 mL/hr over 90 Minutes Intravenous  Once 02/15/18 1219 02/15/18 1446   02/15/18 1230  ceFEPIme (MAXIPIME) 2 g in sodium chloride 0.9 % 100 mL IVPB     2 g 200 mL/hr over 30 Minutes Intravenous  Once 02/15/18 1217 02/15/18 1323   02/15/18 1230  metroNIDAZOLE (FLAGYL) IVPB 500 mg  Status:  Discontinued     500 mg 100 mL/hr over 60 Minutes Intravenous  Every 8 hours 02/15/18 1217 02/17/18 0951   02/15/18 1230  vancomycin (VANCOCIN) IVPB 1000 mg/200 mL premix  Status:  Discontinued     1,000 mg 200 mL/hr over 60 Minutes Intravenous  Once 02/15/18 1217 02/15/18 1219      Procedures: None  CONSULTS:  pulmonary/intensive care  Time spent: 25- minutes-Greater than 50% of this time was spent in counseling, explanation of diagnosis, planning of further management, and coordination of care.  MEDICATIONS: Scheduled Meds: . atorvastatin  80 mg Oral q1800  . cephALEXin  500 mg Oral Q8H  . cholecalciferol  2,000 Units Oral Daily  .  dexamethasone  2 mg Oral BID WC  . insulin aspart  1-3 Units Subcutaneous Q4H  . magnesium chloride  1 tablet Oral BID  . metoprolol tartrate  12.5 mg Oral BID  . pantoprazole  40 mg Oral Daily  . rivaroxaban  10 mg Oral Q supper  . tamsulosin  0.4 mg Oral Daily  . vitamin B-12  5,000 mcg Oral Daily   Continuous Infusions: . sodium chloride 10 mL/hr at 02/19/18 0758  . sodium chloride 100 mL (02/21/18 2226)   PRN Meds:.Place/Maintain arterial line **AND** sodium chloride, sodium chloride, acetaminophen, alum & mag hydroxide-simeth, eye wash, nitroGLYCERIN, sodium chloride flush   PHYSICAL EXAM: Vital signs: Vitals:   02/21/18 2026 02/22/18 0500 02/22/18 0543 02/22/18 0831  BP: (!) 118/59  (!) 146/53 (!) 131/58  Pulse: 88  61 66  Resp: 18  18   Temp: 98.1 F (36.7 C)  99.6 F (37.6 C)   TempSrc: Oral  Oral   SpO2: 98%  98%   Weight:  59.1 kg    Height:       Filed Weights   02/17/18 0500 02/18/18 0500 02/22/18 0500  Weight: 60.9 kg 61 kg 59.1 kg   Body mass index is 25.45 kg/m.   Exam  Awake Alert, Oriented X 3, No new F.N deficits, Chronic L. Facial droop, Normal affect [place].AT,PERRAL Supple Neck,No JVD, No cervical lymphadenopathy appriciated.  Symmetrical Chest wall movement, Good air movement bilaterally, CTAB RRR,No Gallops, Rubs or new Murmurs, No Parasternal Heave +ve B.Sounds, Abd Soft, No tenderness, No organomegaly appriciated, No rebound - guarding or rigidity. No Cyanosis, Clubbing or edema, No new Rash or bruise    I have personally reviewed following labs and imaging studies  LABORATORY DATA: CBC: Recent Labs  Lab 02/15/18 1218 02/16/18 0216 02/17/18 0515 02/19/18 0936  WBC 2.0* 6.9 8.7 4.6  NEUTROABS 0.8*  --   --   --   HGB 10.8* 9.6* 8.4* 9.3*  HCT 36.8 30.5* 27.3* 30.1*  MCV 88.0 84.3 84.8 85.5  PLT 209 194 170 846    Basic Metabolic Panel: Recent Labs  Lab 02/15/18 1957 02/16/18 0216 02/17/18 0515 02/19/18 0936 02/20/18 0440   NA 137 137 139 138 142  K 3.1* 3.2* 3.0* 3.5 3.3*  CL 106 105 105 105 108  CO2 21* 21* 25 24 26   GLUCOSE 99 96 113* 107* 91  BUN 21 18 15  27* 20  CREATININE 1.01* 0.84 0.72 0.70 0.65  CALCIUM 7.8* 7.7* 7.8* 7.8* 7.9*  MG  --  1.3* 2.1  --  2.0  PHOS  --  3.2 2.3*  --   --     GFR: Estimated Creatinine Clearance: 49.6 mL/min (by C-G formula based on SCr of 0.65 mg/dL).  Liver Function Tests: Recent Labs  Lab 02/15/18 1218  AST 19  ALT 22  ALKPHOS 52  BILITOT 0.9  PROT 5.0*  ALBUMIN 3.0*   No results for input(s): LIPASE, AMYLASE in the last 168 hours. No results for input(s): AMMONIA in the last 168 hours.  Coagulation Profile: No results for input(s): INR, PROTIME in the last 168 hours.  Cardiac Enzymes: Recent Labs  Lab 02/18/18 2137 02/19/18 0324 02/19/18 0936  TROPONINI 0.75* 0.54* 0.49*    BNP (last 3 results) No results for input(s): PROBNP in the last 8760 hours.  HbA1C: No results for input(s): HGBA1C in the last 72 hours.  CBG: Recent Labs  Lab 02/21/18 2024 02/22/18 0104 02/22/18 0543 02/22/18 0823 02/22/18 0906  GLUCAP 99 111* 75 64* 102*    Lipid Profile: No results for input(s): CHOL, HDL, LDLCALC, TRIG, CHOLHDL, LDLDIRECT in the last 72 hours.  Thyroid Function Tests: No results for input(s): TSH, T4TOTAL, FREET4, T3FREE, THYROIDAB in the last 72 hours.  Anemia Panel: No results for input(s): VITAMINB12, FOLATE, FERRITIN, TIBC, IRON, RETICCTPCT in the last 72 hours.  Urine analysis:    Component Value Date/Time   COLORURINE AMBER (A) 02/15/2018 1350   APPEARANCEUR HAZY (A) 02/15/2018 1350   LABSPEC 1.011 02/15/2018 1350   PHURINE 8.0 02/15/2018 1350   GLUCOSEU NEGATIVE 02/15/2018 1350   HGBUR NEGATIVE 02/15/2018 1350   BILIRUBINUR NEGATIVE 02/15/2018 1350   KETONESUR NEGATIVE 02/15/2018 1350   PROTEINUR 30 (A) 02/15/2018 1350   NITRITE POSITIVE (A) 02/15/2018 1350   LEUKOCYTESUR SMALL (A) 02/15/2018 1350    Sepsis  Labs: Lactic Acid, Venous    Component Value Date/Time   LATICACIDVEN 2.2 ([place]) 02/15/2018 1957    MICROBIOLOGY: Recent Results (from the past 240 hour(s))  Blood Culture (routine x 2)     Status: None   Collection Time: 02/15/18 12:30 PM  Result Value Ref Range Status   Specimen Description BLOOD LEFT ANTECUBITAL  Final   Special Requests   Final    BOTTLES DRAWN AEROBIC AND ANAEROBIC Blood Culture adequate volume   Culture   Final    NO GROWTH 5 DAYS Performed at [facility], [place] [address] [place], [place] [address]    Report Status 02/20/2018 FINAL  Final  Blood Culture (routine x 2)     Status: None   Collection Time: 02/15/18 12:39 PM  Result Value Ref Range Status   Specimen Description BLOOD RIGHT HAND  Final   Special Requests   Final    BOTTLES DRAWN AEROBIC ONLY Blood Culture results may not be optimal due to an inadequate volume of blood received in culture bottles   Culture   Final    NO GROWTH 5 DAYS Performed at [facility], [place] [address] [place], [place] [address]    Report Status 02/20/2018 FINAL  Final  Urine culture     Status: Abnormal   Collection Time: 02/15/18  1:50 PM  Result Value Ref Range Status   Specimen Description URINE, RANDOM  Final   Special Requests   Final    NONE Performed at [facility], [place] [address], [place], [place] [address]    Culture >=100,000 COLONIES/mL PROTEUS MIRABILIS (A)  Final   Report Status 02/17/2018 FINAL  Final   Organism ID, Bacteria PROTEUS MIRABILIS (A)  Final      Susceptibility   Proteus mirabilis - MIC*    AMPICILLIN <=2 SENSITIVE Sensitive     CEFAZOLIN <=4 SENSITIVE Sensitive     CEFTRIAXONE <=1 SENSITIVE Sensitive     CIPROFLOXACIN <=0.25 SENSITIVE Sensitive  GENTAMICIN <=1 SENSITIVE Sensitive     IMIPENEM 4 SENSITIVE Sensitive     NITROFURANTOIN 128 RESISTANT Resistant     TRIMETH/SULFA <=20 SENSITIVE Sensitive     AMPICILLIN/SULBACTAM <=2 SENSITIVE Sensitive     PIP/TAZO  <=4 SENSITIVE Sensitive     * >=100,000 COLONIES/mL PROTEUS MIRABILIS  MRSA PCR Screening     Status: None   Collection Time: 02/16/18 10:14 PM  Result Value Ref Range Status   MRSA by PCR NEGATIVE NEGATIVE Final    Comment:        The GeneXpert MRSA Assay (FDA approved for NASAL specimens only), is one component of a comprehensive MRSA colonization surveillance program. It is not intended to diagnose MRSA infection nor to guide or monitor treatment for MRSA infections. Performed at [facility], [place] [address] [place], [place] [address]     RADIOLOGY STUDIES/RESULTS: Ct Head Wo Contrast  Result Date: 02/15/2018 CLINICAL DATA:  Altered mental status.  Apnea.  Known CNS lymphoma. EXAM: CT HEAD WITHOUT CONTRAST TECHNIQUE: Contiguous axial images were obtained from the base of the skull through the vertex without intravenous contrast. COMPARISON:  MRI head 09/05/2016.  CT head 08/27/2016 FINDINGS: Brain: Left occipital craniotomy for resection of posterior fossa tumor on the left. Hypodensity in the left middle cerebellar peduncle extending into the inferior cerebellum compatible with postop resection site. Hyperdense tissue lining the cavity could represent residual tumor. Overall the appearance appears similar to the prior MRI of 09/05/2016 Ventricle size normal.  No acute infarct, hemorrhage. Vascular: Negative for hyperdense vessel Skull: Left occipital craniotomy.  No acute abnormality. Sinuses/Orbits: Paranasal sinuses clear. Bilateral cataract surgery. Other: None IMPRESSION: No acute abnormality Postsurgical changes of tumor resection in the left posterior fossa. MRI with contrast would be necessary to evaluate for residual or recurrent tumor. Electronically Signed   By: Franchot Gallo M.D.   On: 02/15/2018 13:56   US Abdomen Complete  Result Date: 02/16/2018 CLINICAL DATA:  Pyelonephritis EXAM: ABDOMEN ULTRASOUND COMPLETE COMPARISON:  None. FINDINGS: Gallbladder: A negative  sonographic Percell Miller sign was reported by the sonographer. There is cholelithiasis and a small amount of gallbladder sludge. No wall thickening. Common bile duct: Diameter: 2 mm Liver: There is a 1.2 cm cyst in the left lobe. Within normal limits in parenchymal echogenicity. Portal vein is patent on color Doppler imaging with normal direction of blood flow towards the liver. IVC: No abnormality visualized. Pancreas: Visualized portion unremarkable. Spleen: Size and appearance within normal limits. Right Kidney: Length: 12.0 cm. Echogenicity within normal limits. No mass or hydronephrosis visualized. Left Kidney: Length: 10.8 cm. Echogenicity within normal limits. No mass or hydronephrosis visualized. Abdominal aorta: No aneurysm visualized. Other findings: None. IMPRESSION: 1. Cholelithiasis without other evidence of acute cholecystitis. 2. Otherwise normal abdominal ultrasound. Electronically Signed   By: Ulyses Jarred M.D.   On: 02/16/2018 03:41   Dg Chest Port 1 View  Result Date: 02/16/2018 CLINICAL DATA:  Sepsis EXAM: PORTABLE CHEST 1 VIEW COMPARISON:  02/15/2018 FINDINGS: Right Port-A-Cath remains in place, unchanged. Mild cardiomegaly. No confluent airspace opacities or significant effusions. No acute bony abnormality. IMPRESSION: Cardiomegaly.  No active disease. Electronically Signed   By: Rolm Baptise M.D.   On: 02/16/2018 07:31   Dg Chest Port 1 View  Result Date: 02/15/2018 CLINICAL DATA:  Multiple syncopal episodes. EXAM: PORTABLE CHEST 1 VIEW COMPARISON:  Chest x-ray 09/04/2016 FINDINGS: The cardiac silhouette, mediastinal and hilar contours are within normal limits and stable. Coronary artery stents are noted.  Right IJ Port-A-Cath appears to be in good position in the distal SVC. The end of the catheter appears somewhat irregular but this may be artifact from something on the patient's chest. A dedicated two view chest x-ray may be helpful for further evaluation. The lungs are clear of an  acute process. No infiltrates, edema or effusions. No pneumothorax. The bony thorax is intact. IMPRESSION: 1. No acute cardiopulmonary findings. 2. Somewhat irregular appearance of the tip of the right IJ Port-A-Cath, possibly due to artifact. A repeat two-view chest x-ray when able may be helpful. Electronically Signed   By: Marijo Sanes M.D.   On: 02/15/2018 12:38     LOS: 7 days   Signature  Lala Lund M.D on 02/22/2018 at 10:47 AM   -  To page go to [email or web address] - password [facility]

## 2018-02-22 NOTE — Progress Notes (Signed)
Hypoglycemic Event  CBG: 64  Treatment: Given 240 cc juice  Symptoms: None.  Follow-up CBG: Time: 0906 CBG Result: 102  Possible Reasons for Event: Patient did not eat breakfast yet.  Comments/MD notified: N/A    Kimberly Vaughn

## 2018-02-22 NOTE — Care Management Important Message (Signed)
Important Message  Patient Details  Name: Kimberly Vaughn MRN: [id number] Date of Birth: Dec 31, 1945   Medicare Important Message Given:     No IM given Precaution in place .  Alisabeth Selkirk 02/22/2018, 3:16 PM

## 2018-02-23 LAB — GLUCOSE, CAPILLARY
Glucose-Capillary: 115 mg/dL — ABNORMAL HIGH (ref 70–99)
Glucose-Capillary: 136 mg/dL — ABNORMAL HIGH (ref 70–99)
Glucose-Capillary: 62 mg/dL — ABNORMAL LOW (ref 70–99)
Glucose-Capillary: 85 mg/dL (ref 70–99)
Glucose-Capillary: 92 mg/dL (ref 70–99)
Glucose-Capillary: 97 mg/dL (ref 70–99)

## 2018-02-23 NOTE — Progress Notes (Signed)
PROGRESS NOTE        PATIENT DETAILS Name: Kimberly Vaughn Age: 72 y.o. Sex: female Date of Birth: Apr 07, 1945 Admit Date: 02/15/2018 Admitting Physician Kipp Brood, MD [id number], Chrys Racer, MD  Brief Narrative: Patient is a 72 y.o. 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  Patient was admitted to the intensive care unit and managed by PCCM, upon stability was transferred to the triad hospitalist service on 12/20.  Subjective:  Patient in bed, is comfortable denies any headache chest or abdominal pain, no focal weakness.  No subjective complaints this morning.    Assessment/Plan:  Septic shock with neutropenic fever secondary to complicated Proteus UTI: Blood Cult -ve, sepsis pathophysiology has resolved, initially required IV vasopressors in the ICU.  Now transition to oral Keflex with a stop date of 02/23/2018.  Continue home dose Decadron for CNS lymphoma.  Advance activity.  Still quite weak likely will require placement.   Anemia: Combination of anemia of chronic disease along with some hemodilution from IV fluids.  No acute blood loss or signs of bleeding.  Outpatient PCP follow-up and monitor.    AKI: Due to sepsis.  Resolved after hydration.  PAF: Rate controlled-resume metoprolol & continue Xarelto.  History of CAD: No anginal symptoms.  Appears stable.  Suspect not on antiplatelet agents as patient is on anticoagulation, continue metoprolol and statin.  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  To new home dose Decadron.  Hypokalemia.  Has been replaced.    DVT Prophylaxis: Full dose anticoagulation with Xarelto  Code Status:  DNR  Family Communication: Daughter over the phone  Disposition  Plan: Remain inpatient-CIR vs SNF on discharge  Antimicrobial agents: Anti-infectives (From admission, onward)   Start     Dose/Rate Route Frequency Ordered Stop   02/18/18 1400  cephALEXin (KEFLEX) capsule 500 mg     500 mg Oral Every 8 hours 02/18/18 1154 02/23/18 2359   02/17/18 1000  cefTRIAXone (ROCEPHIN) 2 g in sodium chloride 0.9 % 100 mL IVPB  Status:  Discontinued     2 g 200 mL/hr over 30 Minutes Intravenous Every 24 hours 02/17/18 0951 02/18/18 1154   02/17/18 0100  vancomycin (VANCOCIN) IVPB 1000 mg/200 mL premix  Status:  Discontinued     1,000 mg 200 mL/hr over 60 Minutes Intravenous Every 36 hours 02/15/18 1704 02/17/18 0951   02/16/18 1300  ceFEPIme (MAXIPIME) 2 g in sodium chloride 0.9 % 100 mL IVPB  Status:  Discontinued     2 g 200 mL/hr over 30 Minutes Intravenous Every 24 hours 02/15/18 1704 02/17/18 0951   02/15/18 1300  vancomycin (VANCOCIN) 1,250 mg in sodium chloride 0.9 % 250 mL IVPB     1,250 mg 166.7 mL/hr over 90 Minutes Intravenous  Once 02/15/18 1219 02/15/18 1446   02/15/18 1230  ceFEPIme (MAXIPIME) 2 g in sodium chloride 0.9 % 100 mL IVPB     2 g 200 mL/hr over 30 Minutes Intravenous  Once 02/15/18 1217 02/15/18 1323   02/15/18 1230  metroNIDAZOLE (FLAGYL) IVPB 500 mg  Status:  Discontinued     500 mg 100 mL/hr over 60 Minutes Intravenous Every 8 hours 02/15/18 1217 02/17/18  9485   02/15/18 1230  vancomycin (VANCOCIN) IVPB 1000 mg/200 mL premix  Status:  Discontinued     1,000 mg 200 mL/hr over 60 Minutes Intravenous  Once 02/15/18 1217 02/15/18 1219      Procedures: None  CONSULTS:  pulmonary/intensive care  Time spent: 25- minutes-Greater than 50% of this time was spent in counseling, explanation of diagnosis, planning of further management, and coordination of care.  MEDICATIONS: Scheduled Meds: . atorvastatin  80 mg Oral q1800  . cephALEXin  500 mg Oral Q8H  . cholecalciferol  2,000 Units Oral Daily  . dexamethasone  2 mg Oral BID WC   . insulin aspart  1-3 Units Subcutaneous Q4H  . magnesium chloride  1 tablet Oral BID  . metoprolol tartrate  12.5 mg Oral BID  . pantoprazole  40 mg Oral Daily  . rivaroxaban  10 mg Oral Q supper  . tamsulosin  0.4 mg Oral Daily  . vitamin B-12  5,000 mcg Oral Daily   Continuous Infusions: . sodium chloride 10 mL/hr at 02/19/18 0758  . sodium chloride 100 mL (02/21/18 2226)   PRN Meds:.Place/Maintain arterial line **AND** sodium chloride, sodium chloride, acetaminophen, alum & mag hydroxide-simeth, eye wash, nitroGLYCERIN, sodium chloride flush   PHYSICAL EXAM: Vital signs: Vitals:   02/22/18 0831 02/22/18 1445 02/22/18 2017 02/23/18 0409  BP: (!) 131/58 (!) 113/55 (!) 131/49 (!) 150/55  Pulse: 66 70 65 60  Resp:  20 17 17   Temp:  99 F (37.2 C) 98.4 F (36.9 C) 98.4 F (36.9 C)  TempSrc:  Oral Oral Oral  SpO2:  97% 99% 99%  Weight:    56.5 kg  Height:       Filed Weights   02/18/18 0500 02/22/18 0500 02/23/18 0409  Weight: 61 kg 59.1 kg 56.5 kg   Body mass index is 24.33 kg/m.   Exam  Awake Alert, Oriented X 3, No new F.N deficits, Chronic L. Facial droop, Normal affect [place].AT,PERRAL Supple Neck,No JVD, No cervical lymphadenopathy appriciated.  Symmetrical Chest wall movement, Good air movement bilaterally, CTAB RRR,No Gallops, Rubs or new Murmurs, No Parasternal Heave +ve B.Sounds, Abd Soft, No tenderness, No organomegaly appriciated, No rebound - guarding or rigidity. No Cyanosis, Clubbing or edema, No new Rash or bruise    I have personally reviewed following labs and imaging studies  LABORATORY DATA: CBC: Recent Labs  Lab 02/17/18 0515 02/19/18 0936  WBC 8.7 4.6  HGB 8.4* 9.3*  HCT 27.3* 30.1*  MCV 84.8 85.5  PLT 170 462    Basic Metabolic Panel: Recent Labs  Lab 02/17/18 0515 02/19/18 0936 02/20/18 0440  NA 139 138 142  K 3.0* 3.5 3.3*  CL 105 105 108  CO2 25 24 26   GLUCOSE 113* 107* 91  BUN 15 27* 20  CREATININE 0.72 0.70 0.65   CALCIUM 7.8* 7.8* 7.9*  MG 2.1  --  2.0  PHOS 2.3*  --   --     GFR: Estimated Creatinine Clearance: 48.6 mL/min (by C-G formula based on SCr of 0.65 mg/dL).  Liver Function Tests: No results for input(s): AST, ALT, ALKPHOS, BILITOT, PROT, ALBUMIN in the last 168 hours. No results for input(s): LIPASE, AMYLASE in the last 168 hours. No results for input(s): AMMONIA in the last 168 hours.  Coagulation Profile: No results for input(s): INR, PROTIME in the last 168 hours.  Cardiac Enzymes: Recent Labs  Lab 02/18/18 2137 02/19/18 0324 02/19/18 0936  TROPONINI 0.75* 0.54* 0.49*  BNP (last 3 results) No results for input(s): PROBNP in the last 8760 hours.  HbA1C: No results for input(s): HGBA1C in the last 72 hours.  CBG: Recent Labs  Lab 02/22/18 1742 02/22/18 2014 02/23/18 0013 02/23/18 0406 02/23/18 0757  GLUCAP 105* 167* 136* 97 62*    Lipid Profile: No results for input(s): CHOL, HDL, LDLCALC, TRIG, CHOLHDL, LDLDIRECT in the last 72 hours.  Thyroid Function Tests: No results for input(s): TSH, T4TOTAL, FREET4, T3FREE, THYROIDAB in the last 72 hours.  Anemia Panel: No results for input(s): VITAMINB12, FOLATE, FERRITIN, TIBC, IRON, RETICCTPCT in the last 72 hours.  Urine analysis:    Component Value Date/Time   COLORURINE AMBER (A) 02/15/2018 1350   APPEARANCEUR HAZY (A) 02/15/2018 1350   LABSPEC 1.011 02/15/2018 1350   PHURINE 8.0 02/15/2018 1350   GLUCOSEU NEGATIVE 02/15/2018 1350   HGBUR NEGATIVE 02/15/2018 1350   BILIRUBINUR NEGATIVE 02/15/2018 1350   KETONESUR NEGATIVE 02/15/2018 1350   PROTEINUR 30 (A) 02/15/2018 1350   NITRITE POSITIVE (A) 02/15/2018 1350   LEUKOCYTESUR SMALL (A) 02/15/2018 1350    Sepsis Labs: Lactic Acid, Venous    Component Value Date/Time   LATICACIDVEN 2.2 ([place]) 02/15/2018 1957    MICROBIOLOGY: Recent Results (from the past 240 hour(s))  Blood Culture (routine x 2)     Status: None   Collection Time: 02/15/18  12:30 PM  Result Value Ref Range Status   Specimen Description BLOOD LEFT ANTECUBITAL  Final   Special Requests   Final    BOTTLES DRAWN AEROBIC AND ANAEROBIC Blood Culture adequate volume   Culture   Final    NO GROWTH 5 DAYS Performed at [facility], [place] [address], [place], [place] [address]    Report Status 02/20/2018 FINAL  Final  Blood Culture (routine x 2)     Status: None   Collection Time: 02/15/18 12:39 PM  Result Value Ref Range Status   Specimen Description BLOOD RIGHT HAND  Final   Special Requests   Final    BOTTLES DRAWN AEROBIC ONLY Blood Culture results may not be optimal due to an inadequate volume of blood received in culture bottles   Culture   Final    NO GROWTH 5 DAYS Performed at [facility], [place] [address] [place], [place] [address]    Report Status 02/20/2018 FINAL  Final  Urine culture     Status: Abnormal   Collection Time: 02/15/18  1:50 PM  Result Value Ref Range Status   Specimen Description URINE, RANDOM  Final   Special Requests   Final    NONE Performed at [facility], [place] [address], [place], [place] [address]    Culture >=100,000 COLONIES/mL PROTEUS MIRABILIS (A)  Final   Report Status 02/17/2018 FINAL  Final   Organism ID, Bacteria PROTEUS MIRABILIS (A)  Final      Susceptibility   Proteus mirabilis - MIC*    AMPICILLIN <=2 SENSITIVE Sensitive     CEFAZOLIN <=4 SENSITIVE Sensitive     CEFTRIAXONE <=1 SENSITIVE Sensitive     CIPROFLOXACIN <=0.25 SENSITIVE Sensitive     GENTAMICIN <=1 SENSITIVE Sensitive     IMIPENEM 4 SENSITIVE Sensitive     NITROFURANTOIN 128 RESISTANT Resistant     TRIMETH/SULFA <=20 SENSITIVE Sensitive     AMPICILLIN/SULBACTAM <=2 SENSITIVE Sensitive     PIP/TAZO <=4 SENSITIVE Sensitive     * >=100,000 COLONIES/mL PROTEUS MIRABILIS  MRSA PCR Screening     Status:  None   Collection Time: 02/16/18 10:14 PM  Result Value Ref Range Status   MRSA by PCR NEGATIVE NEGATIVE Final    Comment:          The GeneXpert MRSA Assay (FDA approved for NASAL specimens only), is one component of a comprehensive MRSA colonization surveillance program. It is not intended to diagnose MRSA infection nor to guide or monitor treatment for MRSA infections. Performed at [facility], [place] [address] [place], [place] [address]     RADIOLOGY STUDIES/RESULTS: Ct Head Wo Contrast  Result Date: 02/15/2018 CLINICAL DATA:  Altered mental status.  Apnea.  Known CNS lymphoma. EXAM: CT HEAD WITHOUT CONTRAST TECHNIQUE: Contiguous axial images were obtained from the base of the skull through the vertex without intravenous contrast. COMPARISON:  MRI head 09/05/2016.  CT head 08/27/2016 FINDINGS: Brain: Left occipital craniotomy for resection of posterior fossa tumor on the left. Hypodensity in the left middle cerebellar peduncle extending into the inferior cerebellum compatible with postop resection site. Hyperdense tissue lining the cavity could represent residual tumor. Overall the appearance appears similar to the prior MRI of 09/05/2016 Ventricle size normal.  No acute infarct, hemorrhage. Vascular: Negative for hyperdense vessel Skull: Left occipital craniotomy.  No acute abnormality. Sinuses/Orbits: Paranasal sinuses clear. Bilateral cataract surgery. Other: None IMPRESSION: No acute abnormality Postsurgical changes of tumor resection in the left posterior fossa. MRI with contrast would be necessary to evaluate for residual or recurrent tumor. Electronically Signed   By: Franchot Gallo M.D.   On: 02/15/2018 13:56   US Abdomen Complete  Result Date: 02/16/2018 CLINICAL DATA:  Pyelonephritis EXAM: ABDOMEN ULTRASOUND COMPLETE COMPARISON:  None. FINDINGS: Gallbladder: A negative sonographic Percell Miller sign was reported by the sonographer. There is cholelithiasis and a small amount of gallbladder sludge. No wall thickening. Common bile duct: Diameter: 2 mm Liver: There is a 1.2 cm cyst in the left lobe. Within normal  limits in parenchymal echogenicity. Portal vein is patent on color Doppler imaging with normal direction of blood flow towards the liver. IVC: No abnormality visualized. Pancreas: Visualized portion unremarkable. Spleen: Size and appearance within normal limits. Right Kidney: Length: 12.0 cm. Echogenicity within normal limits. No mass or hydronephrosis visualized. Left Kidney: Length: 10.8 cm. Echogenicity within normal limits. No mass or hydronephrosis visualized. Abdominal aorta: No aneurysm visualized. Other findings: None. IMPRESSION: 1. Cholelithiasis without other evidence of acute cholecystitis. 2. Otherwise normal abdominal ultrasound. Electronically Signed   By: Ulyses Jarred M.D.   On: 02/16/2018 03:41   Dg Chest Port 1 View  Result Date: 02/16/2018 CLINICAL DATA:  Sepsis EXAM: PORTABLE CHEST 1 VIEW COMPARISON:  02/15/2018 FINDINGS: Right Port-A-Cath remains in place, unchanged. Mild cardiomegaly. No confluent airspace opacities or significant effusions. No acute bony abnormality. IMPRESSION: Cardiomegaly.  No active disease. Electronically Signed   By: Rolm Baptise M.D.   On: 02/16/2018 07:31   Dg Chest Port 1 View  Result Date: 02/15/2018 CLINICAL DATA:  Multiple syncopal episodes. EXAM: PORTABLE CHEST 1 VIEW COMPARISON:  Chest x-ray 09/04/2016 FINDINGS: The cardiac silhouette, mediastinal and hilar contours are within normal limits and stable. Coronary artery stents are noted. Right IJ Port-A-Cath appears to be in good position in the distal SVC. The end of the catheter appears somewhat irregular but this may be artifact from something on the patient's chest. A dedicated two view chest x-ray may be helpful for further evaluation. The lungs are clear of an acute process. No infiltrates, edema or effusions. No pneumothorax. The bony  thorax is intact. IMPRESSION: 1. No acute cardiopulmonary findings. 2. Somewhat irregular appearance of the tip of the right IJ Port-A-Cath, possibly due to  artifact. A repeat two-view chest x-ray when able may be helpful. Electronically Signed   By: Marijo Sanes M.D.   On: 02/15/2018 12:38     LOS: 8 days   Signature  Lala Lund M.D on 02/23/2018 at 9:09 AM   -  To page go to [email or web address] - password [facility]

## 2018-02-24 LAB — GLUCOSE, CAPILLARY
Glucose-Capillary: 137 mg/dL — ABNORMAL HIGH (ref 70–99)
Glucose-Capillary: 69 mg/dL — ABNORMAL LOW (ref 70–99)
Glucose-Capillary: 90 mg/dL (ref 70–99)

## 2018-02-24 MED ORDER — HEPARIN SOD (PORK) LOCK FLUSH 100 UNIT/ML IV SOLN: 500.0000 [IU] | INTRAVENOUS | Status: DC | PRN

## 2018-02-24 NOTE — Discharge Instructions (Signed)
Follow with Primary MD Ernestene Kiel, MD in 5 days   Get CBC, CMP checked  by Primary MD in 5  days   Activity: As tolerated with Full fall precautions use walker/cane & assistance as needed  Disposition SNF or HHPT  Diet: Heart Healthy   Special Instructions: If you have smoked or chewed Tobacco  in the last 2 yrs please stop smoking, stop any regular Alcohol  and or any Recreational drug use.  On your next visit with your primary care physician please Get Medicines reviewed and adjusted.  Please request your Prim.MD to go over all Hospital Tests and Procedure/Radiological results at the follow up, please get all Hospital records sent to your Prim MD by signing hospital release before you go home.  If you experience worsening of your admission symptoms, develop shortness of breath, life threatening emergency, suicidal or homicidal thoughts you must seek medical attention immediately by calling 911 or calling your MD immediately  if symptoms less severe.  You Must read complete instructions/literature along with all the possible adverse reactions/side effects for all the Medicines you take and that have been prescribed to you. Take any new Medicines after you have completely understood and accpet all the possible adverse reactions/side effects.

## 2018-02-24 NOTE — Progress Notes (Signed)
Patient will DC to: [facility] Anticipated DC date: 02/24/18 Family notified: Daughter, Scientific laboratory technician by: Daughter by car   Per MD patient ready for DC to [facility]. RN, patient, patient's family, and facility notified of DC. Discharge Summary and FL2 sent to facility. RN to call report prior to discharge [phone] Room 206). DNR on chart.   CSW will sign off for now as social work intervention is no longer needed. Please consult [place] again if new needs arise.  Cedric Fishman, LCSW Clinical Social Worker [phone]

## 2018-02-24 NOTE — Progress Notes (Addendum)
[facility] has insurance approval for discharge today.  Auth: [id number]  Cedric Fishman LCSW [id number]

## 2018-02-24 NOTE — Discharge Summary (Signed)
Kimberly Vaughn BOF:[id number] DOB: [birth year] DOA: 02/15/2018  PCP: Ernestene Kiel, MD  Admit date: 02/15/2018  Discharge date: 02/24/2018  Admitted From: Home   Disposition:  SNF vs HHPT   Recommendations for Outpatient Follow-up:   Follow up with PCP in 1-2 weeks  PCP Please obtain BMP/CBC, 2 view CXR in 1week,  (see Discharge instructions)   PCP Please follow up on the following pending results:    Home Health: PT-RN if needed   Equipment/Devices: none  Consultations: None Discharge Condition: Stable   CODE STATUS: DNR   Diet Recommendation: Heart Healthy     Chief Complaint  Patient presents with  . Altered Mental Status     Brief history of present illness from the day of admission and additional interim summary    Patient is a 72 y.o. 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  Patient was admitted to the intensive care unit and managed by PCCM, upon stability was transferred to the [facility]ad hospitalist service on 12/20.                                                                 Hospital Course    Septic shock with neutropenic fever secondary to complicated Proteus UTI: Blood Cult -ve, sepsis pathophysiology has resolved, initially required IV vasopressors in the ICU.  Subsequently transition to oral Keflex which was stopped after she finished a course on 02/23/2018.  Continue home dose Decadron for CNS lymphoma.  Is doing fairly well with activity and PT, ambulated in the hallway pretty well twice yesterday, she will be discharged today to SNF if bed available or else home health with PT if needed.   Anemia: Combination of  anemia of chronic disease along with some hemodilution from IV fluids.  No acute blood loss or signs of bleeding.  Outpatient PCP follow-up and monitor.    AKI: Due to sepsis.  Resolved after hydration.  PAF: Rate controlled-resume metoprolol & continue Xarelto.  History of CAD: No anginal symptoms.  Appears stable.  Suspect not on antiplatelet agents as patient is on anticoagulation, continue metoprolol and statin.  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 oncology.  To new home dose Decadron.  Hypokalemia.  Has been replaced.    Discharge diagnosis     Active Problems:   Sepsis ([place])   Pyelonephritis   History of brain tumor   Debility    Discharge instructions    Discharge Instructions    Diet - low sodium heart healthy   Complete by:  As directed    Discharge instructions   Complete by:  As directed  Follow with Primary MD Ernestene Kiel, MD in 5 days   Get CBC, CMP checked  by Primary MD in 5  days   Activity: As tolerated with Full fall precautions use walker/cane & assistance as needed  Disposition SNF or HHPT  Diet: Heart Healthy   Special Instructions: If you have smoked or chewed Tobacco  in the last 2 yrs please stop smoking, stop any regular Alcohol  and or any Recreational drug use.  On your next visit with your primary care physician please Get Medicines reviewed and adjusted.  Please request your Prim.MD to go over all Hospital Tests and Procedure/Radiological results at the follow up, please get all Hospital records sent to your Prim MD by signing hospital release before you go home.  If you experience worsening of your admission symptoms, develop shortness of breath, life threatening emergency, suicidal or homicidal thoughts you must seek medical attention immediately by calling 911 or calling your MD immediately  if symptoms less severe.  You Must read complete  instructions/literature along with all the possible adverse reactions/side effects for all the Medicines you take and that have been prescribed to you. Take any new Medicines after you have completely understood and accpet all the possible adverse reactions/side effects.   Increase activity slowly   Complete by:  As directed       Discharge Medications   Allergies as of 02/24/2018   No Active Allergies     Medication List    TAKE these medications   atorvastatin 80 MG tablet Commonly known as:  LIPITOR Take 80 mg by mouth daily at 6 PM.   Carboxymethylcellulose Sod PF 0.5 % Soln Apply to eye. One drop in left eye 4 times daily.   dexamethasone 2 MG tablet Commonly known as:  DECADRON Take 2 mg by mouth 2 (two) times daily with a meal.   erythromycin ophthalmic ointment Place 1 application into the left eye 4 (four) times daily.   esomeprazole 40 MG capsule Commonly known as:  NEXIUM Take 40 mg by mouth daily.   magnesium chloride 64 MG Tbec SR tablet Commonly known as:  SLOW-MAG Take 1 tablet by mouth 2 (two) times daily.   metoprolol tartrate 25 MG tablet Commonly known as:  LOPRESSOR Take 1 tablet (25 mg total) by mouth 2 (two) times daily.   nitroGLYCERIN 0.4 MG SL tablet Commonly known as:  NITROSTAT Place 1 tablet (0.4 mg total) under the tongue every 5 (five) minutes as needed for chest pain.   ondansetron 8 MG tablet Commonly known as:  ZOFRAN Take 8 mg by mouth. Take one tablet every 8 hours as needed for nausea   Potassium Chloride ER 20 MEQ Tbcr Take 1 tablet by mouth 2 (two) times daily.   rivaroxaban 10 MG Tabs tablet Commonly known as:  XARELTO Take 10 mg by mouth daily with supper.   sennosides-docusate sodium 8.6-50 MG tablet Commonly known as:  SENOKOT-S Take 1 tablet by mouth 2 (two) times daily.   tamsulosin 0.4 MG Caps capsule Commonly known as:  FLOMAX Take 0.4 mg by mouth daily.   Vitamin B-12 5000 MCG Subl Take 5,000 mcg by mouth  daily.   Vitamin D3 50 MCG (2000 UT) Tabs Take 2,000 Units by mouth daily.        Contact information for follow-up providers    Ernestene Kiel, MD. Schedule an appointment as soon as possible for a visit in 5 day(s).   Specialty:  Internal Medicine Contact information:  [place] [address] [phone]            Contact information for after-discharge care    Destination    [facility] Preferred SNF .   Service:  Skilled Nursing Contact information: [place] [phone]                  Major procedures and Radiology Reports - PLEASE review detailed and final reports thoroughly  -        Ct Head Wo Contrast  Result Date: 02/15/2018 CLINICAL DATA:  Altered mental status.  Apnea.  Known CNS lymphoma. EXAM: CT HEAD WITHOUT CONTRAST TECHNIQUE: Contiguous axial images were obtained from the base of the skull through the vertex without intravenous contrast. COMPARISON:  MRI head 09/05/2016.  CT head 08/27/2016 FINDINGS: Brain: Left occipital craniotomy for resection of posterior fossa tumor on the left. Hypodensity in the left middle cerebellar peduncle extending into the inferior cerebellum compatible with postop resection site. Hyperdense tissue lining the cavity could represent residual tumor. Overall the appearance appears similar to the prior MRI of 09/05/2016 Ventricle size normal.  No acute infarct, hemorrhage. Vascular: Negative for hyperdense vessel Skull: Left occipital craniotomy.  No acute abnormality. Sinuses/Orbits: Paranasal sinuses clear. Bilateral cataract surgery. Other: None IMPRESSION: No acute abnormality Postsurgical changes of tumor resection in the left posterior fossa. MRI with contrast would be necessary to evaluate for residual or recurrent tumor. Electronically Signed   By: Franchot Gallo M.D.   On: 02/15/2018 13:56   US Abdomen Complete  Result Date:  02/16/2018 CLINICAL DATA:  Pyelonephritis EXAM: ABDOMEN ULTRASOUND COMPLETE COMPARISON:  None. FINDINGS: Gallbladder: A negative sonographic Percell Miller sign was reported by the sonographer. There is cholelithiasis and a small amount of gallbladder sludge. No wall thickening. Common bile duct: Diameter: 2 mm Liver: There is a 1.2 cm cyst in the left lobe. Within normal limits in parenchymal echogenicity. Portal vein is patent on color Doppler imaging with normal direction of blood flow towards the liver. IVC: No abnormality visualized. Pancreas: Visualized portion unremarkable. Spleen: Size and appearance within normal limits. Right Kidney: Length: 12.0 cm. Echogenicity within normal limits. No mass or hydronephrosis visualized. Left Kidney: Length: 10.8 cm. Echogenicity within normal limits. No mass or hydronephrosis visualized. Abdominal aorta: No aneurysm visualized. Other findings: None. IMPRESSION: 1. Cholelithiasis without other evidence of acute cholecystitis. 2. Otherwise normal abdominal ultrasound. Electronically Signed   By: Ulyses Jarred M.D.   On: 02/16/2018 03:41   Dg Chest Port 1 View  Result Date: 02/16/2018 CLINICAL DATA:  Sepsis EXAM: PORTABLE CHEST 1 VIEW COMPARISON:  02/15/2018 FINDINGS: Right Port-A-Cath remains in place, unchanged. Mild cardiomegaly. No confluent airspace opacities or significant effusions. No acute bony abnormality. IMPRESSION: Cardiomegaly.  No active disease. Electronically Signed   By: Rolm Baptise M.D.   On: 02/16/2018 07:31   Dg Chest Port 1 View  Result Date: 02/15/2018 CLINICAL DATA:  Multiple syncopal episodes. EXAM: PORTABLE CHEST 1 VIEW COMPARISON:  Chest x-ray 09/04/2016 FINDINGS: The cardiac silhouette, mediastinal and hilar contours are within normal limits and stable. Coronary artery stents are noted. Right IJ Port-A-Cath appears to be in good position in the distal SVC. The end of the catheter appears somewhat irregular but this may be artifact from  something on the patient's chest. A dedicated two view chest x-ray may be helpful for further evaluation. The lungs are clear of an acute process. No infiltrates, edema or effusions. No  pneumothorax. The bony thorax is intact. IMPRESSION: 1. No acute cardiopulmonary findings. 2. Somewhat irregular appearance of the tip of the right IJ Port-A-Cath, possibly due to artifact. A repeat two-view chest x-ray when able may be helpful. Electronically Signed   By: Marijo Sanes M.D.   On: 02/15/2018 12:38    Micro Results     Recent Results (from the past 240 hour(s))  Blood Culture (routine x 2)     Status: None   Collection Time: 02/15/18 12:30 PM  Result Value Ref Range Status   Specimen Description BLOOD LEFT ANTECUBITAL  Final   Special Requests   Final    BOTTLES DRAWN AEROBIC AND ANAEROBIC Blood Culture adequate volume   Culture   Final    NO GROWTH 5 DAYS Performed at [facility], [address] [place], [place] [address]    Report Status 02/20/2018 FINAL  Final  Blood Culture (routine x 2)     Status: None   Collection Time: 02/15/18 12:39 PM  Result Value Ref Range Status   Specimen Description BLOOD RIGHT HAND  Final   Special Requests   Final    BOTTLES DRAWN AEROBIC ONLY Blood Culture results may not be optimal due to an inadequate volume of blood received in culture bottles   Culture   Final    NO GROWTH 5 DAYS Performed at [facility], [place] [address], [place], [place] [address]    Report Status 02/20/2018 FINAL  Final  Urine culture     Status: Abnormal   Collection Time: 02/15/18  1:50 PM  Result Value Ref Range Status   Specimen Description URINE, RANDOM  Final   Special Requests   Final    NONE Performed at [facility], [place] [address], [place], [place] [address]    Culture >=100,000 COLONIES/mL PROTEUS MIRABILIS (A)  Final   Report Status 02/17/2018 FINAL  Final   Organism ID, Bacteria PROTEUS MIRABILIS (A)  Final      Susceptibility   Proteus  mirabilis - MIC*    AMPICILLIN <=2 SENSITIVE Sensitive     CEFAZOLIN <=4 SENSITIVE Sensitive     CEFTRIAXONE <=1 SENSITIVE Sensitive     CIPROFLOXACIN <=0.25 SENSITIVE Sensitive     GENTAMICIN <=1 SENSITIVE Sensitive     IMIPENEM 4 SENSITIVE Sensitive     NITROFURANTOIN 128 RESISTANT Resistant     TRIMETH/SULFA <=20 SENSITIVE Sensitive     AMPICILLIN/SULBACTAM <=2 SENSITIVE Sensitive     PIP/TAZO <=4 SENSITIVE Sensitive     * >=100,000 COLONIES/mL PROTEUS MIRABILIS  MRSA PCR Screening     Status: None   Collection Time: 02/16/18 10:14 PM  Result Value Ref Range Status   MRSA by PCR NEGATIVE NEGATIVE Final    Comment:        The GeneXpert MRSA Assay (FDA approved for NASAL specimens only), is one component of a comprehensive MRSA colonization surveillance program. It is not intended to diagnose MRSA infection nor to guide or monitor treatment for MRSA infections. Performed at [facility], [place] [address] [place], [place] [address]     Today   Subjective    Kimberly Vaughn today has no headache,no chest abdominal pain,no new weakness tingling or numbness, feels much better.    Objective   Blood pressure (!) 138/56, pulse (!) 58, temperature 98.2 F (36.8 C), resp. rate 18, height 5' (1.524 m), weight 56.5 kg, SpO2 99 %.   Intake/Output Summary (Last 24 hours)  at 02/24/2018 0827 Last data filed at 02/23/2018 1100 Gross per 24 hour  Intake 520 ml  Output 600 ml  Net -80 ml    Exam Awake Alert, Oriented x 3, No new F.N deficits, Chronic L. Facial droop, Normal affect [place].AT,PERRAL Supple Neck,No JVD, No cervical lymphadenopathy appriciated.  Symmetrical Chest wall movement, Good air movement bilaterally, CTAB RRR,No Gallops,Rubs or new Murmurs, No Parasternal Heave +ve B.Sounds, Abd Soft, Non tender, No organomegaly appriciated, No rebound -guarding or rigidity. No Cyanosis, Clubbing or edema, No new Rash or bruise   Data Review   CBC w Diff:  Lab Results   Component Value Date   WBC 4.6 02/19/2018   HGB 9.3 (L) 02/19/2018   HGB 13.1 07/22/2016   HCT 30.1 (L) 02/19/2018   HCT 38.5 07/22/2016   PLT 162 02/19/2018   PLT 224 07/22/2016   LYMPHOPCT 56 02/15/2018   BANDSPCT 1 02/15/2018   MONOPCT 4 02/15/2018   EOSPCT 0 02/15/2018   BASOPCT 0 02/15/2018    CMP:  Lab Results  Component Value Date   NA 142 02/20/2018   NA 140 11/11/2016   K 3.3 (L) 02/20/2018   CL 108 02/20/2018   CO2 26 02/20/2018   BUN 20 02/20/2018   BUN 9 11/11/2016   CREATININE 0.65 02/20/2018   GLU 93 11/11/2016   PROT 5.0 (L) 02/15/2018   PROT 6.4 07/22/2016   ALBUMIN 3.0 (L) 02/15/2018   ALBUMIN 4.5 07/22/2016   BILITOT 0.9 02/15/2018   BILITOT 0.7 07/22/2016   ALKPHOS 52 02/15/2018   AST 19 02/15/2018   ALT 22 02/15/2018  .   Total Time in preparing paper work, data evaluation and todays exam - 61 minutes  Lala Lund M.D on 02/24/2018 at 8:27 AM  [facility]   Office  [phone]

## 2018-02-24 NOTE — Progress Notes (Signed)
Report called to [place].

## 2018-02-24 NOTE — Progress Notes (Signed)
Nsg Discharge Note  Admit Date:  02/15/2018 Discharge date: 02/24/2018   Pricilla Holm Cronic to be D/C'd Skilled nursing facility per MD order.  AVS completed.  Copy for chart, and copy for patient signed, and dated. Patient/caregiver able to verbalize understanding.  Discharge Medication: Allergies as of 02/24/2018   No Active Allergies     Medication List    TAKE these medications   atorvastatin 80 MG tablet Commonly known as:  LIPITOR Take 80 mg by mouth daily at 6 PM.   Carboxymethylcellulose Sod PF 0.5 % Soln Apply to eye. One drop in left eye 4 times daily.   dexamethasone 2 MG tablet Commonly known as:  DECADRON Take 2 mg by mouth 2 (two) times daily with a meal.   erythromycin ophthalmic ointment Place 1 application into the left eye 4 (four) times daily.   esomeprazole 40 MG capsule Commonly known as:  NEXIUM Take 40 mg by mouth daily.   magnesium chloride 64 MG Tbec SR tablet Commonly known as:  SLOW-MAG Take 1 tablet by mouth 2 (two) times daily.   metoprolol tartrate 25 MG tablet Commonly known as:  LOPRESSOR Take 1 tablet (25 mg total) by mouth 2 (two) times daily.   nitroGLYCERIN 0.4 MG SL tablet Commonly known as:  NITROSTAT Place 1 tablet (0.4 mg total) under the tongue every 5 (five) minutes as needed for chest pain.   ondansetron 8 MG tablet Commonly known as:  ZOFRAN Take 8 mg by mouth. Take one tablet every 8 hours as needed for nausea   Potassium Chloride ER 20 MEQ Tbcr Take 1 tablet by mouth 2 (two) times daily.   rivaroxaban 10 MG Tabs tablet Commonly known as:  XARELTO Take 10 mg by mouth daily with supper.   sennosides-docusate sodium 8.6-50 MG tablet Commonly known as:  SENOKOT-S Take 1 tablet by mouth 2 (two) times daily.   tamsulosin 0.4 MG Caps capsule Commonly known as:  FLOMAX Take 0.4 mg by mouth daily.   Vitamin B-12 5000 MCG Subl Take 5,000 mcg by mouth daily.   Vitamin D3 50 MCG (2000 UT) Tabs Take 2,000 Units by mouth  daily.       Discharge Assessment: Vitals:   02/24/18 0415 02/24/18 0901  BP:    Pulse: (!) 58 (!) 59  Resp:    Temp:    SpO2: 99%    Skin clean, dry and intact without evidence of skin break down, no evidence of skin tears noted. IV catheter discontinued intact. Site without signs and symptoms of complications - no redness or edema noted at insertion site, patient denies c/o pain - only slight tenderness at site.  Dressing with slight pressure applied.  D/c Instructions-Education: Discharge instructions given to patient/family with verbalized understanding. D/c education completed with patient/family including follow up instructions, medication list, d/c activities limitations if indicated, with other d/c instructions as indicated by MD - patient able to verbalize understanding, all questions fully answered. Patient instructed to return to ED, call 911, or call MD for any changes in condition.  Patient escorted via [place], and D/C to SNF via private auto.  [place] N Arynn Armand, RN 02/24/2018 11:46 AM

## 2018-02-24 NOTE — Clinical Social Work Placement (Signed)
   CLINICAL SOCIAL WORK PLACEMENT  NOTE  Date:  02/24/2018  Patient Details  Name: Kimberly Vaughn MRN: [id number] Date of Birth: 08-05-1945  Clinical Social Work is seeking post-discharge placement for this patient at the [place] level of care (*CSW will initial, date and re-position this form in  chart as items are completed):  Yes   Patient/family provided with [facility] Work Department's list of facilities offering this level of care within the geographic area requested by the patient (or if unable, by the patient's family).  Yes   Patient/family informed of their freedom to choose among providers that offer the needed level of care, that participate in Medicare, Medicaid or managed care program needed by the patient, have an available bed and are willing to accept the patient.  Yes   Patient/family informed of [facility]'s ownership interest in [facility] and [facility], as well as of the fact that they are under no obligation to receive care at these facilities.  PASRR submitted to EDS on       PASRR number received on       Existing PASRR number confirmed on 02/18/18     FL2 transmitted to all facilities in geographic area requested by pt/family on 02/18/18     FL2 transmitted to all facilities within larger geographic area on       Patient informed that his/her managed care company has contracts with or will negotiate with certain facilities, including the following:        Yes   Patient/family informed of bed offers received.  Patient chooses bed at [place], [place]     Physician recommends and patient chooses bed at      Patient to be transferred to [facility] on 02/24/18.  Patient to be transferred to facility by Daughter by car     Patient family notified on 02/24/18 of transfer.  Name of family member notified:  Daughter     PHYSICIAN       Additional Comment:     _______________________________________________ Benard Halsted, LCSW 02/24/2018, 10:38 AM

## 2018-03-28 ENCOUNTER — Other Ambulatory Visit: Payer: Self-pay | Admitting: Cardiology

## 2018-03-29 ENCOUNTER — Other Ambulatory Visit: Payer: Self-pay | Admitting: Cardiology

## 2018-03-29 NOTE — Telephone Encounter (Signed)
Medication has already been refilled.

## 2018-04-24 ENCOUNTER — Other Ambulatory Visit: Payer: Self-pay | Admitting: Cardiology

## 2018-05-15 ENCOUNTER — Other Ambulatory Visit: Payer: Self-pay | Admitting: Cardiology

## 2018-05-20 ENCOUNTER — Ambulatory Visit: Payer: Medicare Other | Admitting: Cardiology

## 2018-06-14 ENCOUNTER — Other Ambulatory Visit: Payer: Self-pay | Admitting: Cardiology

## 2018-07-07 DIAGNOSIS — M65341 Trigger finger, right ring finger: Secondary | ICD-10-CM

## 2018-07-07 HISTORY — DX: Trigger finger, right ring finger: M65.341

## 2018-07-11 ENCOUNTER — Other Ambulatory Visit: Payer: Self-pay | Admitting: Cardiology

## 2018-07-11 NOTE — Telephone Encounter (Signed)
Rx refill sent to pharmacy. 

## 2018-07-13 ENCOUNTER — Telehealth: Payer: Medicare Other | Admitting: Cardiology

## 2018-07-21 ENCOUNTER — Telehealth (INDEPENDENT_AMBULATORY_CARE_PROVIDER_SITE_OTHER): Payer: Medicare Other | Admitting: Cardiology

## 2018-07-21 ENCOUNTER — Encounter: Payer: Self-pay | Admitting: Cardiology

## 2018-07-21 ENCOUNTER — Telehealth: Payer: Self-pay | Admitting: Emergency Medicine

## 2018-07-21 ENCOUNTER — Other Ambulatory Visit: Payer: Self-pay

## 2018-07-21 VITALS — Wt 122.0 lb

## 2018-07-21 DIAGNOSIS — I493 Ventricular premature depolarization: Secondary | ICD-10-CM

## 2018-07-21 DIAGNOSIS — I480 Paroxysmal atrial fibrillation: Secondary | ICD-10-CM

## 2018-07-21 DIAGNOSIS — I10 Essential (primary) hypertension: Secondary | ICD-10-CM

## 2018-07-21 NOTE — Progress Notes (Signed)
Virtual Visit via Video Note   This visit type was conducted due to national recommendations for restrictions regarding the COVID-19 Pandemic (e.g. social distancing) in an effort to limit this patient's exposure and mitigate transmission in our community.  Due to her co-morbid illnesses, this patient is at least at moderate risk for complications without adequate follow up.  This format is felt to be most appropriate for this patient at this time.  All issues noted in this document were discussed and addressed.  A limited physical exam was performed with this format.  Please refer to the patient's chart for her consent to telehealth for [facility].  Evaluation Performed:  Follow-up visit  This visit type was conducted due to national recommendations for restrictions regarding the COVID-19 Pandemic (e.g. social distancing).  This format is felt to be most appropriate for this patient at this time.  All issues noted in this document were discussed and addressed.  No physical exam was performed (except for noted visual exam findings with Video Visits).  Please refer to the patient's chart (MyChart message for video visits and phone note for telephone visits) for the patient's consent to telehealth for [facility].  Date:  07/21/2018  ID: Kimberly Vaughn, DOB 08/20/1945, MRN [id number]   Patient Location: [employer] [employer] [address]   Provider location:   [place] Office  PCP:  Ernestene Kiel, MD  Cardiologist:  Jenne Campus, MD     Chief Complaint: Doing well  History of Present Illness:    Kimberly Vaughn is a 73 y.o. female  who presents via audio/video conferencing for a telehealth visit today.  Complex past medical history which include brain tumor, paroxysmal atrial fibrillation, essential hypertension, coronary artery disease.  She does have a video visit with me today overall doing well I plan to she is planning quite well to chemotherapy and  tomorrow her brain is shrinking.  There is another MRI is being scheduled within the next few weeks.  Cardiac wise described rare episode of palpitations overall doing well from that point review.   The patient does not have symptoms concerning for COVID-19 infection (fever, chills, cough, or new SHORTNESS OF BREATH).    Prior CV studies:   The following studies were reviewed today:       Past Medical History:  Diagnosis Date  . A-fib ([place])   . Acute deep vein thrombosis (DVT) of left upper extremity ([place]) 11/19/2016   unspecified vein, associated with PICC line  . Anginal pain ([place])   . Asthma    as a child  . BPPV (benign paroxysmal positional vertigo) 12/25/2015  . Brain tumor ([place]) 09/04/2016  . Coronary artery disease   . DDD (degenerative disc disease), lumbar   . Degenerative joint disease   . Dizziness and giddiness   . Dysrhythmia    A fib  . Episodic atrial fibrillation ([place])   . Esophageal reflux   . GERD (gastroesophageal reflux disease)   . Hiatal hernia   . History of kidney stones   . Hypertension, essential   . Kidney stones   . Liver cyst LEFT  . Myocardial infarction ([place])   . Nodule of parotid gland   . Non-Hodgkin lymphoma ([place])    Brain  . Paroxysmal atrial fibrillation ([place]) 09/01/2016   Chads2Vas score equals 3, but only 1 episodes in 2016, recent event recorder showing no evidence of atrial fibrillation.  . Pneumonia   . PONV (postoperative  nausea and vomiting)   . Pulmonary nodule   . PVC (premature ventricular contraction)    Nov 2017 & Feb 2018 noted on cardiac monitor worn at home    Past Surgical History:  Procedure Laterality Date  . APPLICATION OF CRANIAL NAVIGATION N/A 09/04/2016   Procedure: APPLICATION OF CRANIAL NAVIGATION;  Surgeon: Consuella Lose, MD;  Location: [place];  Service: Neurosurgery;  Laterality: N/A;  . BRAIN SURGERY  09/04/2016  . BREAST BIOPSY Bilateral   . CARPAL TUNNEL RELEASE Right 09/2013  . CATARACTS  Bilateral   . CERVICAL SPINE SURGERY  04/2012  . FINGER SURGERY Right    TRIGGER FINGER RELEASE  . PARTIAL THYMECTOMY  1973   pt states doesn't remember this being done  . RETROSIGMOID CRANIECTOMY FOR TUMOR RESECTION Left 09/04/2016   Procedure: RETROSIGMOID CRANIECTOMY FOR TUMOR RESECTION WITH BRAINLAB NAVIGATION;  Surgeon: Consuella Lose, MD;  Location: [place];  Service: Neurosurgery;  Laterality: Left;  CRANIOTOMY TUMOR EXCISION  . TONSILLECTOMY  1964  . VAGINAL HYSTERECTOMY  1974     Current Meds  Medication Sig  . atorvastatin (LIPITOR) 80 MG tablet TAKE 1 TABLET BY MOUTH EVERY DAY  . Carboxymethylcellulose Sod PF 0.5 % SOLN Apply to eye. One drop in left eye 4 times daily.  . Cholecalciferol (VITAMIN D3) 2000 units TABS Take 2,000 Units by mouth daily.  . Cyanocobalamin (VITAMIN B-12) 5000 MCG SUBL Take 5,000 mcg by mouth daily.   Marland Kitchen erythromycin ophthalmic ointment Place 1 application into the left eye 4 (four) times daily.  Marland Kitchen esomeprazole (NEXIUM) 40 MG capsule Take 40 mg by mouth daily.  . magnesium chloride (SLOW-MAG) 64 MG TBEC SR tablet Take 1 tablet by mouth 2 (two) times daily.   . metoprolol tartrate (LOPRESSOR) 25 MG tablet TAKE 1 TABLET(25 MG) BY MOUTH TWICE DAILY  . nitroGLYCERIN (NITROSTAT) 0.4 MG SL tablet Place 1 tablet (0.4 mg total) under the tongue every 5 (five) minutes as needed for chest pain.  Marland Kitchen ondansetron (ZOFRAN) 8 MG tablet Take 8 mg by mouth. Take one tablet every 8 hours as needed for nausea  . Potassium Chloride ER 20 MEQ TBCR Take 1 tablet by mouth 2 (two) times daily. 2  In the morning and 1 at night  . rivaroxaban (XARELTO) 10 MG TABS tablet Take 10 mg by mouth daily with supper.   . sennosides-docusate sodium (SENOKOT-S) 8.6-50 MG tablet Take 1 tablet by mouth 2 (two) times daily.  . tamsulosin (FLOMAX) 0.4 MG CAPS capsule Take 0.4 mg by mouth daily.      Family History: The patient's family history includes Heart attack (age of onset: 54) in  her father; Heart disease in her father; Heart disease (age of onset: 17) in her mother; Hypertension in her brother, daughter, mother, sister, and another family member; Lymphoma in her sister and another family member; Melanoma in her brother and another family member; Osteoarthritis in an other family member; Psoriasis in her mother; Rheum arthritis in her brother and mother.   ROS:   Please see the history of present illness.     All other systems reviewed and are negative.   Labs/Other Tests and Data Reviewed:     Recent Labs: 02/15/2018: ALT 22 02/19/2018: Hemoglobin 9.3; Platelets 162 02/20/2018: BUN 20; Creatinine, Ser 0.65; Magnesium 2.0; Potassium 3.3; Sodium 142  Recent Lipid Panel No results found for: CHOL, TRIG, HDL, CHOLHDL, VLDL, LDLCALC, LDLDIRECT    Exam:    Vital Signs:  Wt 122 lb (  55.3 kg)   LMP  (LMP Unknown)   BMI 23.83 kg/m     Wt Readings from Last 3 Encounters:  07/21/18 122 lb (55.3 kg)  02/23/18 124 lb 9 oz (56.5 kg)  11/11/17 140 lb (63.5 kg)     Well nourished, well developed in no acute distress. Alert awake oriented x3 happy to be able to see me on the video link she does have facial nerve palsy which is a chronic problem.  Diagnosis for this visit:   1. Paroxysmal atrial fibrillation (HCC)   2. Essential hypertension   3. PVC (premature ventricular contraction)      ASSESSMENT & PLAN:    1.  Paroxysmal atrial fibrillation described to have rare palpitations she is on small dose of Xarelto.  That is decided by oncology team.  Overall cardiac wise doing well she takes small dose of beta-blocker and actually question was about potentially reducing this medication.  Her blood pressure drops to 110/50 but she is completely asymptomatic with this therefore I prefer to continue at this dose.  I told her if she will feel weak and tired and exhausted with low blood pressure then dose of metoprolol can be reduced by half to only 12.5 mg twice daily  but at this point I do not see need to do that. 2.  Essential hypertension opposite problem right now blood pressure being on the lower side discussion as above. 3.  PVCs denies having any significant problem will continue with current dose of beta-blocker. 4.  Remote history of CAD stable.  COVID-19 Education: The signs and symptoms of COVID-19 were discussed with the patient and how to seek care for testing (follow up with PCP or arrange E-visit).  The importance of social distancing was discussed today.  Patient Risk:   After full review of this patients clinical status, I feel that they are at least moderate risk at this time.  Time:   Today, I have spent 17 minutes with the patient with telehealth technology discussing pt health issues.  I spent 5 minutes reviewing her chart before the visit.  Visit was finished at 1:40 PM.    Medication Adjustments/Labs and Tests Ordered: Current medicines are reviewed at length with the patient today.  Concerns regarding medicines are outlined above.  No orders of the defined types were placed in this encounter.  Medication changes: No orders of the defined types were placed in this encounter.    Disposition: Follow-up in 3 months  Signed, Park Liter, MD, [facility] 07/21/2018 1:41 PM    [place]

## 2018-07-21 NOTE — Patient Instructions (Signed)
Medication Instructions:  Your physician recommends that you continue on your current medications as directed. Please refer to the Current Medication list given to you today.  If you need a refill on your cardiac medications before your next appointment, please call your pharmacy.   Lab work: None.  If you have labs (blood work) drawn today and your tests are completely normal, you will receive your results only by: . MyChart Message (if you have MyChart) OR . A paper copy in the mail If you have any lab test that is abnormal or we need to change your treatment, we will call you to review the results.  Testing/Procedures: None.   Follow-Up: At [facility], you and your health needs are our priority.  As part of our continuing mission to provide you with exceptional heart care, we have created designated Provider Care Teams.  These Care Teams include your primary Cardiologist (physician) and Advanced Practice Providers (APPs -  Physician Assistants and Nurse Practitioners) who all work together to provide you with the care you need, when you need it. You will need a follow up appointment in 3 months.  Please call our office 2 months in advance to schedule this appointment.  You may see No primary care provider on file. or another member of our [facility] Provider Team in [place]: Brian Munley, MD . Rajan Revankar, MD  Any Other Special Instructions Will Be Listed Below (If Applicable).     

## 2018-07-21 NOTE — Telephone Encounter (Signed)
Left message for patient to return call she needs a 3 month follow up appointment with Dr. Agustin Cree.

## 2018-10-08 ENCOUNTER — Other Ambulatory Visit: Payer: Self-pay | Admitting: Cardiology

## 2018-10-11 ENCOUNTER — Other Ambulatory Visit: Payer: Self-pay | Admitting: Cardiology

## 2018-10-11 NOTE — Telephone Encounter (Signed)
Metoprolol refill sent

## 2019-01-01 ENCOUNTER — Other Ambulatory Visit: Payer: Self-pay | Admitting: Cardiology

## 2019-02-06 ENCOUNTER — Other Ambulatory Visit: Payer: Self-pay

## 2019-02-06 ENCOUNTER — Encounter: Payer: Self-pay | Admitting: Cardiology

## 2019-02-06 ENCOUNTER — Ambulatory Visit (INDEPENDENT_AMBULATORY_CARE_PROVIDER_SITE_OTHER): Payer: Medicare Other | Admitting: Cardiology

## 2019-02-06 DIAGNOSIS — I25118 Atherosclerotic heart disease of native coronary artery with other forms of angina pectoris: Secondary | ICD-10-CM

## 2019-02-06 DIAGNOSIS — I10 Essential (primary) hypertension: Secondary | ICD-10-CM | POA: Diagnosis not present

## 2019-02-06 DIAGNOSIS — I480 Paroxysmal atrial fibrillation: Secondary | ICD-10-CM | POA: Diagnosis not present

## 2019-02-06 DIAGNOSIS — D496 Neoplasm of unspecified behavior of brain: Secondary | ICD-10-CM

## 2019-02-06 NOTE — Patient Instructions (Signed)

## 2019-02-06 NOTE — Progress Notes (Signed)
Cardiology Office Note:    Date:  02/06/2019   ID:  Amanie Bethea [place], DOB 28-Nov-1945, MRN [id number]  PCP:  Ernestene Kiel, MD  Cardiologist:  Jenne Campus, MD    Referring MD: Ernestene Kiel, MD   Chief Complaint  Patient presents with  . Follow-up  Doing well cardiac wise  History of Present Illness:    Kimberly Vaughn is a 73 y.o. female with history of brain lymphoma, paroxysmal atrial fibrillation, remote coronary artery disease.  Comes today to my office for follow-up.  Cardiac wise describe 1 episode of palpitation she apparently woke up in the middle of the night with her heart speeding up lasting about 10 minutes.  Another issue she was noted to have blood pressure being low and her metoprolol has been lowered from 25 twice daily to only 12.5 twice a day.  Otherwise considering her main problem which is brain lymphoma she is doing quite well.  She walks and move around with a walker and like always is quite cheerful and optimistic.  Past Medical History:  Diagnosis Date  . A-fib ([place])   . Acute deep vein thrombosis (DVT) of left upper extremity ([place]) 11/19/2016   unspecified vein, associated with PICC line  . Anginal pain ([place])   . Asthma    as a child  . BPPV (benign paroxysmal positional vertigo) 12/25/2015  . Brain tumor ([place]) 09/04/2016  . Coronary artery disease   . DDD (degenerative disc disease), lumbar   . Degenerative joint disease   . Dizziness and giddiness   . Dysrhythmia    A fib  . Episodic atrial fibrillation ([place])   . Esophageal reflux   . GERD (gastroesophageal reflux disease)   . Hiatal hernia   . History of kidney stones   . Hypertension, essential   . Kidney stones   . Liver cyst LEFT  . Myocardial infarction ([place])   . Nodule of parotid gland   . Non-Hodgkin lymphoma ([place])    Brain  . Paroxysmal atrial fibrillation ([place]) 09/01/2016   Chads2Vas score equals 3, but only 1 episodes in 2016, recent event recorder showing no evidence of  atrial fibrillation.  . Pneumonia   . PONV (postoperative nausea and vomiting)   . Pulmonary nodule   . PVC (premature ventricular contraction)    Nov 2017 & Feb 2018 noted on cardiac monitor worn at home    Past Surgical History:  Procedure Laterality Date  . APPLICATION OF CRANIAL NAVIGATION N/A 09/04/2016   Procedure: APPLICATION OF CRANIAL NAVIGATION;  Surgeon: Consuella Lose, MD;  Location: [place] [facility];  Service: Neurosurgery;  Laterality: N/A;  . BRAIN SURGERY  09/04/2016  . BREAST BIOPSY Bilateral   . CARPAL TUNNEL RELEASE Right 09/2013  . CATARACTS Bilateral   . CERVICAL SPINE SURGERY  04/2012  . FINGER SURGERY Right    TRIGGER FINGER RELEASE  . PARTIAL THYMECTOMY  1973   pt states doesn't remember this being done  . RETROSIGMOID CRANIECTOMY FOR TUMOR RESECTION Left 09/04/2016   Procedure: RETROSIGMOID CRANIECTOMY FOR TUMOR RESECTION WITH BRAINLAB NAVIGATION;  Surgeon: Consuella Lose, MD;  Location: [place];  Service: Neurosurgery;  Laterality: Left;  CRANIOTOMY TUMOR EXCISION  . TONSILLECTOMY  1964  . VAGINAL HYSTERECTOMY  1974    Current Medications: Current Meds  Medication Sig  . atorvastatin (LIPITOR) 80 MG tablet TAKE 1 TABLET BY MOUTH EVERY DAY  . Carboxymethylcellulose Sod PF 0.5 % SOLN Apply to eye. One drop in left eye 4 times daily.  Marland Kitchen  Cholecalciferol (VITAMIN D3) 2000 units TABS Take 2,000 Units by mouth daily.  . Cyanocobalamin (VITAMIN B-12) 5000 MCG SUBL Take 5,000 mcg by mouth daily.   Marland Kitchen erythromycin ophthalmic ointment Place 1 application into the left eye 4 (four) times daily.  Marland Kitchen esomeprazole (NEXIUM) 40 MG capsule Take 40 mg by mouth daily.  Marland Kitchen lenalidomide (REVLIMID) 10 MG capsule Take 10 mg by mouth daily.  . magnesium chloride (SLOW-MAG) 64 MG TBEC SR tablet Take 1 tablet by mouth 2 (two) times daily.   . metoprolol tartrate (LOPRESSOR) 25 MG tablet TAKE 1 TABLET(25 MG) BY MOUTH TWICE DAILY (Patient taking differently: 12.5 mg. )  . nitrofurantoin,  macrocrystal-monohydrate, (MACROBID) 100 MG capsule Take 1 capsule by mouth daily.  . nitroGLYCERIN (NITROSTAT) 0.4 MG SL tablet Place 1 tablet (0.4 mg total) under the tongue every 5 (five) minutes as needed for chest pain.  Marland Kitchen ondansetron (ZOFRAN) 8 MG tablet Take 8 mg by mouth. Take one tablet every 8 hours as needed for nausea  . Potassium Chloride ER 20 MEQ TBCR Take 1 tablet by mouth 2 (two) times daily. 2  In the morning and 1 at night  . rivaroxaban (XARELTO) 10 MG TABS tablet Take 10 mg by mouth daily with supper.   . sennosides-docusate sodium (SENOKOT-S) 8.6-50 MG tablet Take 1 tablet by mouth 2 (two) times daily.  . tamsulosin (FLOMAX) 0.4 MG CAPS capsule Take 0.4 mg by mouth daily.     Allergies:   Patient has no known allergies.   Social History   Socioeconomic History  . Marital status: Married    Spouse name: Not on file  . Number of children: 1  . Years of education: 51  . Highest education level: Not on file  Occupational History  . Occupation: Retired Armed forces technical officer  . Financial resource strain: Not on file  . Food insecurity    Worry: Not on file    Inability: Not on file  . Transportation needs    Medical: Not on file    Non-medical: Not on file  Tobacco Use  . Smoking status: Former Smoker    Types: Cigarettes    Quit date: 10/23/2005    Years since quitting: 13.2  . Smokeless tobacco: Never Used  Substance and Sexual Activity  . Alcohol use: No  . Drug use: No  . Sexual activity: Not Currently    Comment: MARRIED  Lifestyle  . Physical activity    Days per week: Not on file    Minutes per session: Not on file  . Stress: Not on file  Relationships  . Social Herbalist on phone: Not on file    Gets together: Not on file    Attends religious service: Not on file    Active member of club or organization: Not on file    Attends meetings of clubs or organizations: Not on file    Relationship status: Not on file  Other Topics Concern   . Not on file  Social History Narrative   Lives with husband   Caffeine use: 2 cups coffee per day   Admitted to [facility]   Former smoker - stopped 2007   Alcohol none   DNR     Family History: The patient's family history includes Heart attack (age of onset: 61) in her father; Heart disease in her father; Heart disease (age of onset: 52) in her mother; Hypertension in her brother, daughter, mother,  sister, and another family member; Lymphoma in her sister and another family member; Melanoma in her brother and another family member; Osteoarthritis in an other family member; Psoriasis in her mother; Rheum arthritis in her brother and mother. ROS:   Please see the history of present illness.    All 14 point review of systems negative except as described per history of present illness  EKGs/Labs/Other Studies Reviewed:      Recent Labs: 02/15/2018: ALT 22 02/19/2018: Hemoglobin 9.3; Platelets 162 02/20/2018: BUN 20; Creatinine, Ser 0.65; Magnesium 2.0; Potassium 3.3; Sodium 142  Recent Lipid Panel No results found for: CHOL, TRIG, HDL, CHOLHDL, VLDL, LDLCALC, LDLDIRECT  Physical Exam:    VS:  BP 110/64   Pulse 80   Ht 5' (1.524 m)   Wt 115 lb (52.2 kg)   LMP  (LMP Unknown)   SpO2 94%   BMI 22.46 kg/m     Wt Readings from Last 3 Encounters:  02/06/19 115 lb (52.2 kg)  07/21/18 122 lb (55.3 kg)  02/23/18 124 lb 9 oz (56.5 kg)     GEN:  Well nourished, well developed in no acute distress HEENT: Normal NECK: No JVD; No carotid bruits LYMPHATICS: No lymphadenopathy CARDIAC: RRR, no murmurs, no rubs, no gallops RESPIRATORY:  Clear to auscultation without rales, wheezing or rhonchi  ABDOMEN: Soft, non-tender, non-distended MUSCULOSKELETAL:  No edema; No deformity  SKIN: Warm and dry LOWER EXTREMITIES: no swelling NEUROLOGIC:  Alert and oriented x 3 PSYCHIATRIC:  Normal affect   ASSESSMENT:    1. Paroxysmal atrial fibrillation (HCC)   2. Essential  hypertension   3. Coronary artery disease of native artery of native heart with stable angina pectoris ([place])   4. Brain tumor ([place])    PLAN:    In order of problems listed above:  1. Paroxysmal atrial fibrillation today sounds like she is in sinus rhythm will do EKG to confirm the rhythm.  I will ask her to continue 12.5 mg metoprolol twice daily.  We will continue anticoagulation Essential hypertension we have a post problem with now blood pressure being low Coronary artery disease with remote interventions.  Stable from that point review Brain lymphoma follow-up excellently localized by oncology team.   Medication Adjustments/Labs and Tests Ordered: Current medicines are reviewed at length with the patient today.  Concerns regarding medicines are outlined above.  No orders of the defined types were placed in this encounter.  Medication changes: No orders of the defined types were placed in this encounter.   Signed, Park Liter, MD, [facility] 02/06/2019 11:42 AM    [place]

## 2019-02-06 NOTE — Addendum Note (Signed)
Addended by: Ashok Norris on: 02/06/2019 12:14 PM   Modules accepted: Orders

## 2019-03-20 DIAGNOSIS — Z1231 Encounter for screening mammogram for malignant neoplasm of breast: Secondary | ICD-10-CM | POA: Diagnosis not present

## 2019-03-22 ENCOUNTER — Other Ambulatory Visit: Payer: Self-pay | Admitting: Cardiology

## 2019-03-22 IMAGING — MR MR BRAIN/IAC WO/W
10 of 11 series · 31 of 48 positions shown · IV contrast (multihance)
Comparison: MRI brain and internal auditory canals 04/14/2016

ADDENDUM:
I discussed this case with a neuroradiology colleague. While I still
believe labyrinthitis is the most likely diagnosis, the masslike
appearance at the base of the 7th/8th cranial nerves is certainly
atypical and raises the question of tumor. This would be
particularly worrisome if the patient has any malignancy history,
especially of head and neck cancer. CSF sampling might be helpful,
but, at a minimum, follow-up MRI in 6-12 weeks is recommended.

These results will be called to the ordering clinician or
representative by the Radiologist Assistant, and communication
documented in the PACS or zVision Dashboard.
CLINICAL DATA: Complete left-sided hearing loss. Left facial droop
1-2 months ago.
Creatinine was obtained on site at [HOSPITAL] at [HOSPITAL].
Results: Creatinine 0.8 mg/dL.
EXAM:
MRI HEAD WITHOUT AND WITH CONTRAST
TECHNIQUE: Multiplanar, multiecho pulse sequences of the brain and surrounding
structures were obtained without and with intravenous contrast.
CONTRAST:  16mL MULTIHANCE GADOBENATE DIMEGLUMINE 529 MG/ML IV SOLN

[Series 2: T1 · sagittal · 5.0mm · 0.45mm/px · 4 of 22 slices shown (1 of 4)]
[im 1/22]
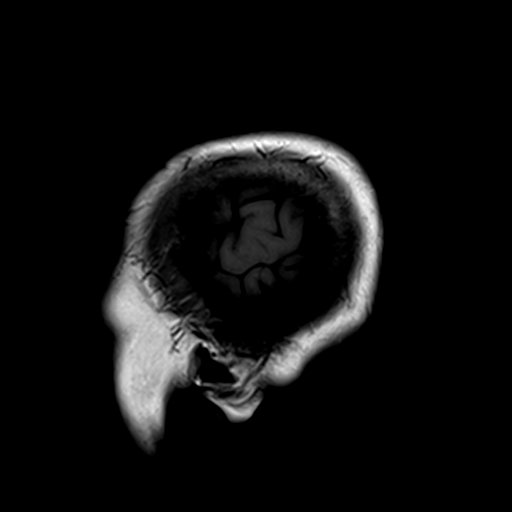
[im 8/22]
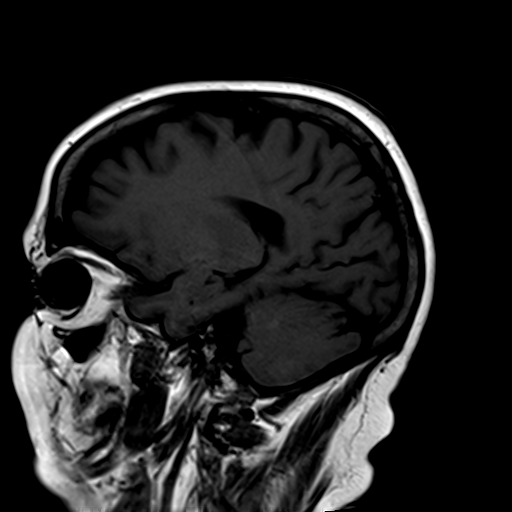
[im 15/22]
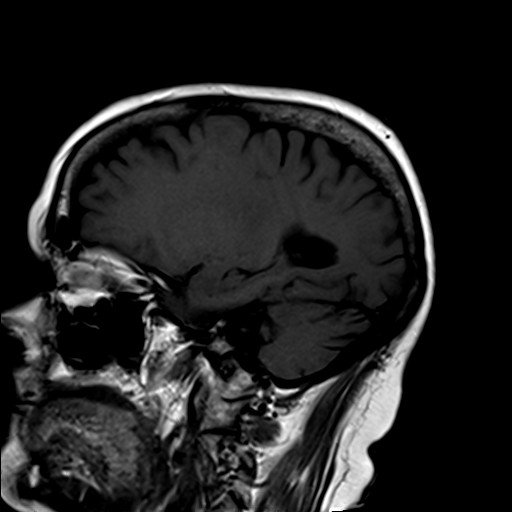
[im 22/22]
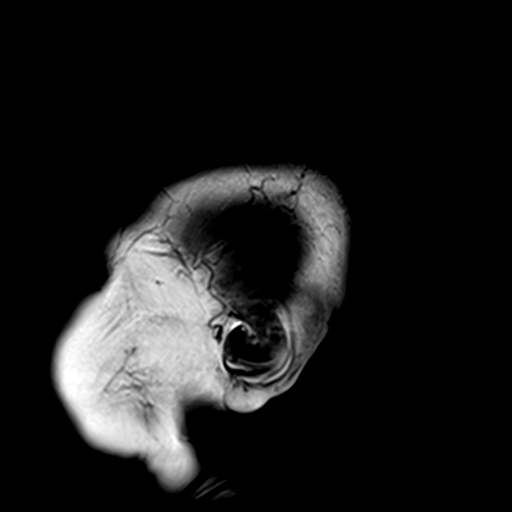

[Series 3: T2 · axial · 5.0mm · 0.45mm/px · z∈[-8,+135]mm · 4 of 24 slices shown]
[im 1/24]
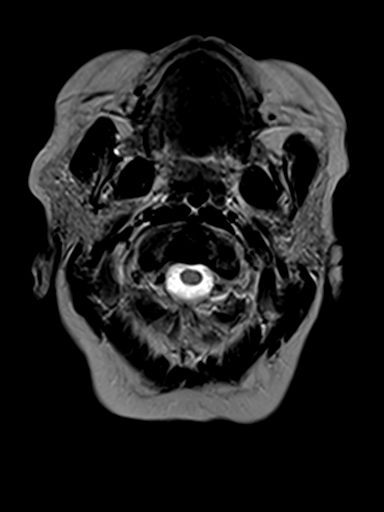
[im 8/24]
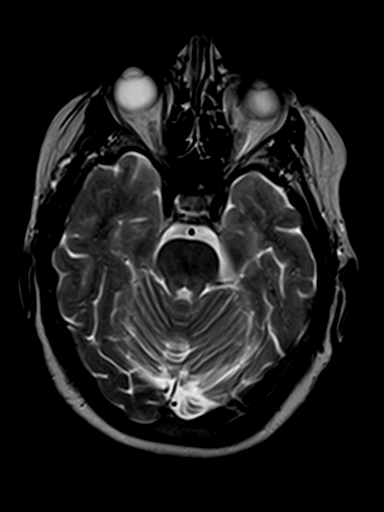
[im 16/24]
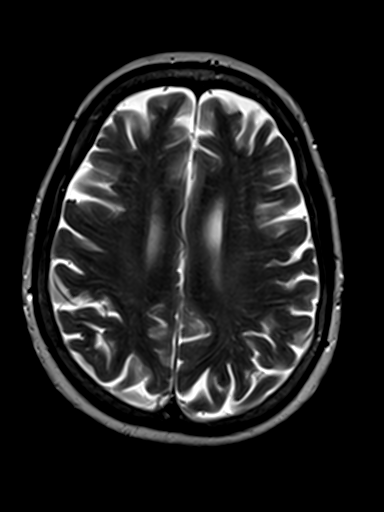
[im 24/24]
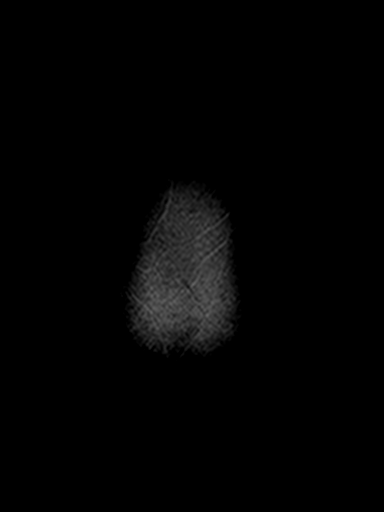

[Series 4: ep2d_diff_(id)_trace · axial · 3.0mm · 1.80mm/px · z∈[-8,+132]mm · 8 of 95 slices shown]
[im 1/95]
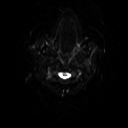
[im 16/95]
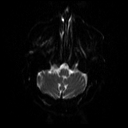
[im 32/95]
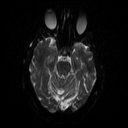
[im 40/95]
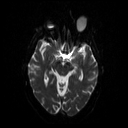
[im 55/95]
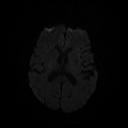
[im 63/95]
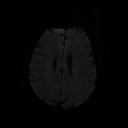
[im 79/95]
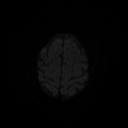
[im 95/95]
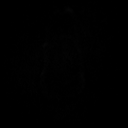

[Series 5: ep2d_diff_(id)_trace_adc · axial · 3.0mm · 1.80mm/px · z∈[-8,+132]mm · 6 of 48 slices shown]
[im 1/48]
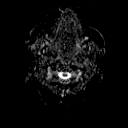
[im 10/48]
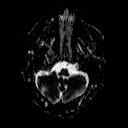
[im 19/48]
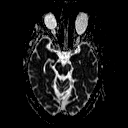
[im 29/48]
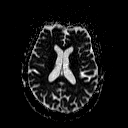
[im 38/48]
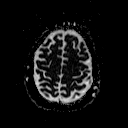
[im 48/48]
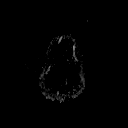

[Series 7: T1 · coronal · 3.0mm · 0.35mm/px · 1 of 11 slices shown (2 of 4)]
[im 1/11]
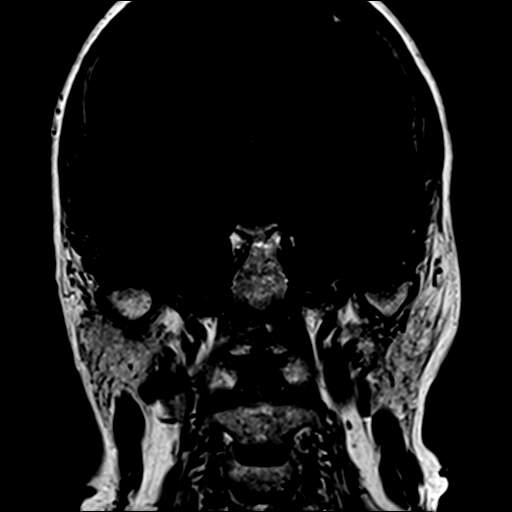

[Series 8: T1 · axial · 3.0mm · 0.35mm/px · 1 of 11 slices shown (3 of 4)]
[im 1/11]
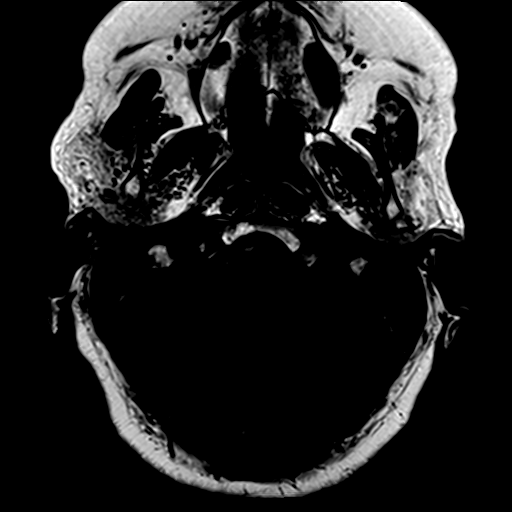

[Series 9: bSSFP · axial · 0.7mm · 0.28mm/px · z∈[-3,+3]mm · 2 of 40 slices shown]
[im 1/40]
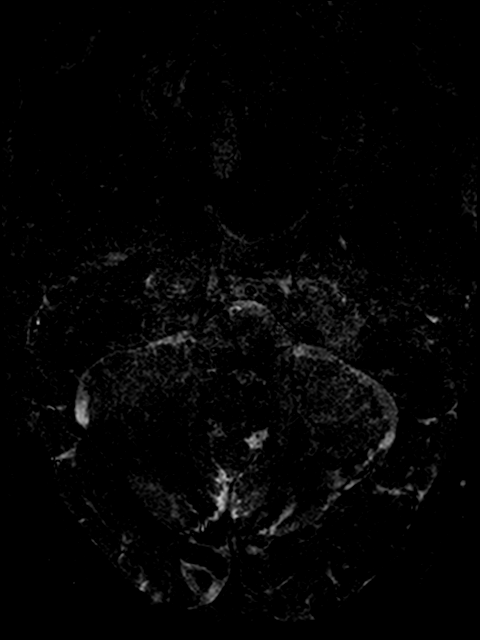
[im 10/40]
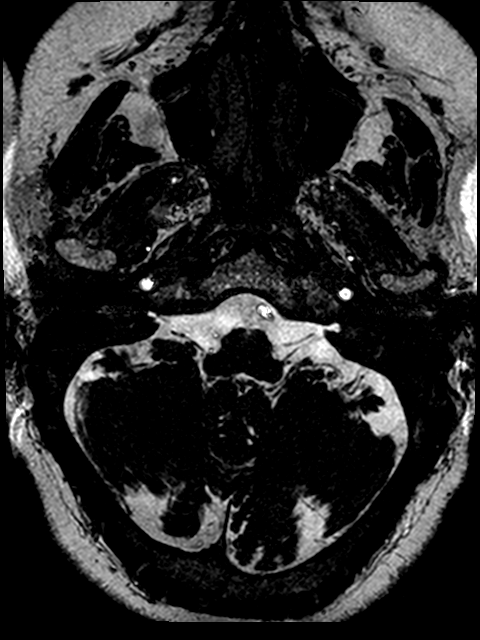

[Series 10: FLAIR · axial · 3.0mm · 0.45mm/px · z∈[-7,+130]mm · 3 of 24 slices shown]
[im 1/24]
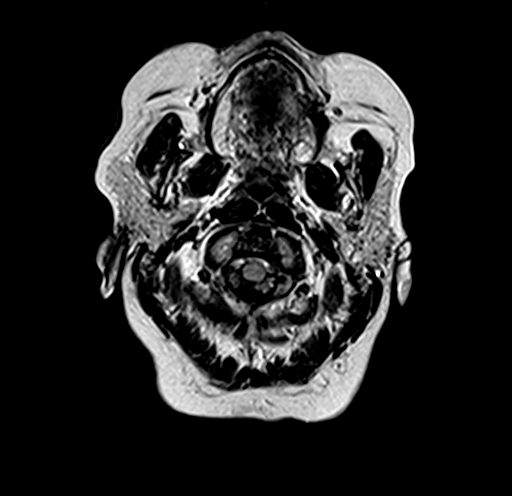
[im 12/24]
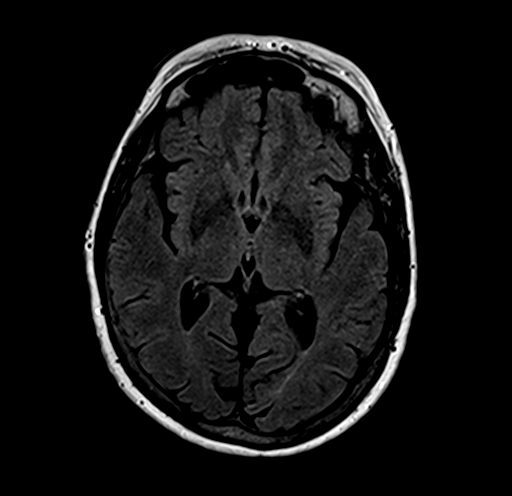
[im 24/24]
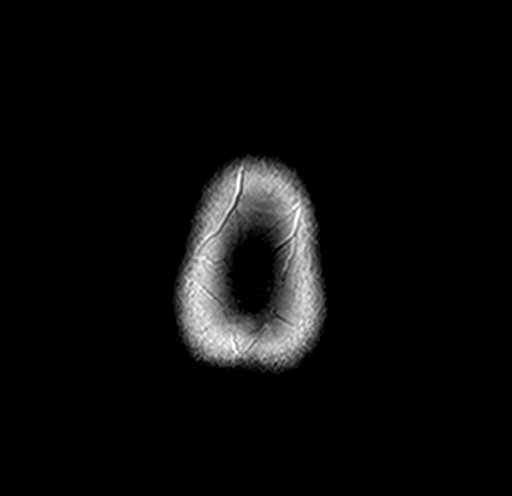

[Series 11: T1 · coronal · 3.0mm · 0.35mm/px · 1 of 11 slices shown (4 of 4)]
[im 1/11]
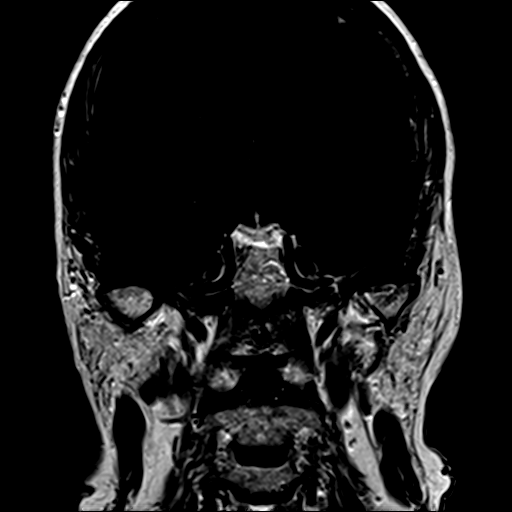

[Series 12: T1 post-contrast · axial · 3.0mm · 0.35mm/px · 1 of 11 slices shown]
[im 1/11]
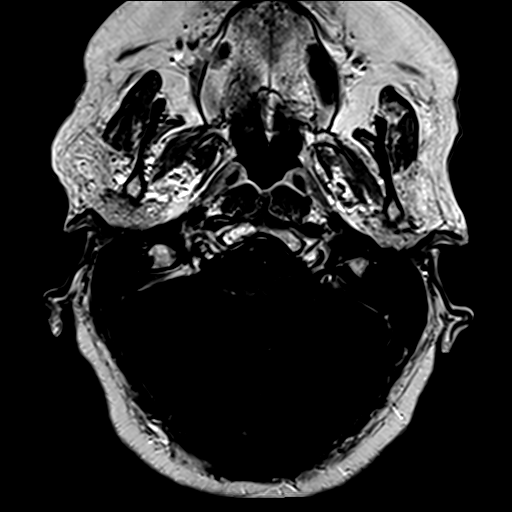

[31 of 48 positions shown; findings below may reference images not displayed]

FINDINGS: Brain: The midline structures are normal. There is no focal
diffusion restriction to indicate acute infarct. The brain
parenchymal signal is normal and there is no mass lesion. No
intraparenchymal hematoma or chronic microhemorrhage. Brain volume
is normal for age without age-advanced or lobar predominant atrophy.
The dura is normal and there is no extra-axial collection.

Internal Auditory Canals: There is increased contrast enhancement of
the cisternal segments of the left seventh and eighth cranial
nerves. At the base of the nerves, there is a masslike area of
enhancement that measures 6 x 6 mm. The contrast enhancement extends
along the cisternal segments and into the cochlear that and semi
circular canals. There is also abnormal contrast enhancement along
the labyrinthine, tympanic and mastoid segments of the left facial
nerve. The right internal auditory canal and lower round venous
structures are normal.

Vascular: Major intracranial arterial and venous sinus flow voids
are preserved.

Skull and upper cervical spine: The visualized skull base,
calvarium, upper cervical spine and extracranial soft tissues are
normal.

Sinuses/Orbits: No fluid levels or advanced mucosal thickening. No
mastoid effusion. Normal orbits.
IMPRESSION: 1. Increased contrast enhancement of the left seventh and eighth
cranial nerves, extending from the origin of both nerves at the
pontomedullary junction and along both cisternal segments and
intracanalicular segments. There is contrast enhancement of the
labyrinthine structures and the labyrinthine, tympanic and mastoid
segments of the left facial nerve. These findings are most
consistent with inflammatory or infectious labyrinthitis.
Enhancement may persist after resolution of symptoms.
2. Mass like enhancement at the origin of the seventh and eighth
cranial nerves is likely secondary to the same process as above. It
would be unusual for a schwannoma to increase in size at this rate
and to cause enhancement of the distal facial nerve and labyrinthine
structures.
3. Normal right internal auditory canal.
4. Normal appearance of the brain itself.

## 2019-03-23 DIAGNOSIS — E876 Hypokalemia: Secondary | ICD-10-CM | POA: Diagnosis not present

## 2019-03-23 DIAGNOSIS — Z7901 Long term (current) use of anticoagulants: Secondary | ICD-10-CM | POA: Diagnosis not present

## 2019-03-23 DIAGNOSIS — G510 Bell's palsy: Secondary | ICD-10-CM | POA: Diagnosis not present

## 2019-03-23 DIAGNOSIS — C8589 Other specified types of non-Hodgkin lymphoma, extranodal and solid organ sites: Secondary | ICD-10-CM | POA: Diagnosis not present

## 2019-03-23 DIAGNOSIS — Z955 Presence of coronary angioplasty implant and graft: Secondary | ICD-10-CM | POA: Diagnosis not present

## 2019-03-23 DIAGNOSIS — Z86718 Personal history of other venous thrombosis and embolism: Secondary | ICD-10-CM | POA: Diagnosis not present

## 2019-04-18 DIAGNOSIS — G319 Degenerative disease of nervous system, unspecified: Secondary | ICD-10-CM | POA: Diagnosis not present

## 2019-04-18 DIAGNOSIS — G9389 Other specified disorders of brain: Secondary | ICD-10-CM | POA: Diagnosis not present

## 2019-04-18 DIAGNOSIS — G96198 Other disorders of meninges, not elsewhere classified: Secondary | ICD-10-CM | POA: Diagnosis not present

## 2019-04-18 DIAGNOSIS — C8589 Other specified types of non-Hodgkin lymphoma, extranodal and solid organ sites: Secondary | ICD-10-CM | POA: Diagnosis not present

## 2019-04-25 DIAGNOSIS — C8589 Other specified types of non-Hodgkin lymphoma, extranodal and solid organ sites: Secondary | ICD-10-CM | POA: Diagnosis not present

## 2019-05-01 DIAGNOSIS — H02402 Unspecified ptosis of left eyelid: Secondary | ICD-10-CM | POA: Diagnosis not present

## 2019-05-01 DIAGNOSIS — H52223 Regular astigmatism, bilateral: Secondary | ICD-10-CM | POA: Diagnosis not present

## 2019-05-01 DIAGNOSIS — C8589 Other specified types of non-Hodgkin lymphoma, extranodal and solid organ sites: Secondary | ICD-10-CM | POA: Diagnosis not present

## 2019-05-01 DIAGNOSIS — E876 Hypokalemia: Secondary | ICD-10-CM | POA: Diagnosis not present

## 2019-05-01 DIAGNOSIS — H524 Presbyopia: Secondary | ICD-10-CM | POA: Diagnosis not present

## 2019-05-01 DIAGNOSIS — H43391 Other vitreous opacities, right eye: Secondary | ICD-10-CM | POA: Diagnosis not present

## 2019-05-01 DIAGNOSIS — R202 Paresthesia of skin: Secondary | ICD-10-CM | POA: Diagnosis not present

## 2019-05-01 DIAGNOSIS — Z961 Presence of intraocular lens: Secondary | ICD-10-CM | POA: Diagnosis not present

## 2019-05-01 DIAGNOSIS — H35441 Age-related reticular degeneration of retina, right eye: Secondary | ICD-10-CM | POA: Diagnosis not present

## 2019-05-01 DIAGNOSIS — H16221 Keratoconjunctivitis sicca, not specified as Sjogren's, right eye: Secondary | ICD-10-CM | POA: Diagnosis not present

## 2019-05-01 DIAGNOSIS — H401111 Primary open-angle glaucoma, right eye, mild stage: Secondary | ICD-10-CM | POA: Diagnosis not present

## 2019-05-01 DIAGNOSIS — Z86718 Personal history of other venous thrombosis and embolism: Secondary | ICD-10-CM | POA: Diagnosis not present

## 2019-05-01 DIAGNOSIS — I25119 Atherosclerotic heart disease of native coronary artery with unspecified angina pectoris: Secondary | ICD-10-CM | POA: Diagnosis not present

## 2019-05-01 DIAGNOSIS — H9192 Unspecified hearing loss, left ear: Secondary | ICD-10-CM | POA: Diagnosis not present

## 2019-05-01 DIAGNOSIS — G510 Bell's palsy: Secondary | ICD-10-CM | POA: Diagnosis not present

## 2019-06-08 DIAGNOSIS — H9312 Tinnitus, left ear: Secondary | ICD-10-CM | POA: Diagnosis not present

## 2019-06-08 DIAGNOSIS — H02402 Unspecified ptosis of left eyelid: Secondary | ICD-10-CM | POA: Diagnosis not present

## 2019-06-08 DIAGNOSIS — M6530 Trigger finger, unspecified finger: Secondary | ICD-10-CM | POA: Diagnosis not present

## 2019-06-08 DIAGNOSIS — G510 Bell's palsy: Secondary | ICD-10-CM | POA: Diagnosis not present

## 2019-06-08 DIAGNOSIS — R200 Anesthesia of skin: Secondary | ICD-10-CM | POA: Diagnosis not present

## 2019-06-08 DIAGNOSIS — C8589 Other specified types of non-Hodgkin lymphoma, extranodal and solid organ sites: Secondary | ICD-10-CM | POA: Diagnosis not present

## 2019-06-08 DIAGNOSIS — E876 Hypokalemia: Secondary | ICD-10-CM | POA: Diagnosis not present

## 2019-06-08 DIAGNOSIS — I2510 Atherosclerotic heart disease of native coronary artery without angina pectoris: Secondary | ICD-10-CM | POA: Diagnosis not present

## 2019-06-08 DIAGNOSIS — R278 Other lack of coordination: Secondary | ICD-10-CM | POA: Diagnosis not present

## 2019-06-08 DIAGNOSIS — H9192 Unspecified hearing loss, left ear: Secondary | ICD-10-CM | POA: Diagnosis not present

## 2019-06-08 DIAGNOSIS — C8581 Other specified types of non-Hodgkin lymphoma, lymph nodes of head, face, and neck: Secondary | ICD-10-CM | POA: Diagnosis not present

## 2019-06-15 DIAGNOSIS — C8589 Other specified types of non-Hodgkin lymphoma, extranodal and solid organ sites: Secondary | ICD-10-CM | POA: Diagnosis not present

## 2019-06-22 DIAGNOSIS — C8589 Other specified types of non-Hodgkin lymphoma, extranodal and solid organ sites: Secondary | ICD-10-CM | POA: Diagnosis not present

## 2019-07-06 DIAGNOSIS — G9389 Other specified disorders of brain: Secondary | ICD-10-CM | POA: Diagnosis not present

## 2019-07-06 DIAGNOSIS — R278 Other lack of coordination: Secondary | ICD-10-CM | POA: Diagnosis not present

## 2019-07-06 DIAGNOSIS — H02402 Unspecified ptosis of left eyelid: Secondary | ICD-10-CM | POA: Diagnosis not present

## 2019-07-06 DIAGNOSIS — C8589 Other specified types of non-Hodgkin lymphoma, extranodal and solid organ sites: Secondary | ICD-10-CM | POA: Diagnosis not present

## 2019-07-06 DIAGNOSIS — G96198 Other disorders of meninges, not elsewhere classified: Secondary | ICD-10-CM | POA: Diagnosis not present

## 2019-07-06 DIAGNOSIS — G510 Bell's palsy: Secondary | ICD-10-CM | POA: Diagnosis not present

## 2019-07-06 DIAGNOSIS — I25119 Atherosclerotic heart disease of native coronary artery with unspecified angina pectoris: Secondary | ICD-10-CM | POA: Diagnosis not present

## 2019-07-06 DIAGNOSIS — I4891 Unspecified atrial fibrillation: Secondary | ICD-10-CM | POA: Diagnosis not present

## 2019-07-06 DIAGNOSIS — H918X2 Other specified hearing loss, left ear: Secondary | ICD-10-CM | POA: Diagnosis not present

## 2019-07-08 ENCOUNTER — Other Ambulatory Visit: Payer: Self-pay | Admitting: Cardiology

## 2019-07-19 DIAGNOSIS — L565 Disseminated superficial actinic porokeratosis (DSAP): Secondary | ICD-10-CM | POA: Diagnosis not present

## 2019-07-19 DIAGNOSIS — L821 Other seborrheic keratosis: Secondary | ICD-10-CM | POA: Diagnosis not present

## 2019-07-19 DIAGNOSIS — L570 Actinic keratosis: Secondary | ICD-10-CM | POA: Diagnosis not present

## 2019-07-19 DIAGNOSIS — D692 Other nonthrombocytopenic purpura: Secondary | ICD-10-CM | POA: Diagnosis not present

## 2019-07-20 DIAGNOSIS — C8589 Other specified types of non-Hodgkin lymphoma, extranodal and solid organ sites: Secondary | ICD-10-CM | POA: Diagnosis not present

## 2019-08-17 DIAGNOSIS — Z86718 Personal history of other venous thrombosis and embolism: Secondary | ICD-10-CM | POA: Diagnosis not present

## 2019-08-17 DIAGNOSIS — R634 Abnormal weight loss: Secondary | ICD-10-CM | POA: Diagnosis not present

## 2019-08-17 DIAGNOSIS — C8589 Other specified types of non-Hodgkin lymphoma, extranodal and solid organ sites: Secondary | ICD-10-CM | POA: Diagnosis not present

## 2019-08-17 DIAGNOSIS — G510 Bell's palsy: Secondary | ICD-10-CM | POA: Diagnosis not present

## 2019-08-17 DIAGNOSIS — Z79899 Other long term (current) drug therapy: Secondary | ICD-10-CM | POA: Diagnosis not present

## 2019-08-17 DIAGNOSIS — R599 Enlarged lymph nodes, unspecified: Secondary | ICD-10-CM | POA: Diagnosis not present

## 2019-08-21 DIAGNOSIS — Z1331 Encounter for screening for depression: Secondary | ICD-10-CM | POA: Diagnosis not present

## 2019-08-21 DIAGNOSIS — Z6821 Body mass index (BMI) 21.0-21.9, adult: Secondary | ICD-10-CM | POA: Diagnosis not present

## 2019-08-21 DIAGNOSIS — K1120 Sialoadenitis, unspecified: Secondary | ICD-10-CM | POA: Diagnosis not present

## 2019-08-31 DIAGNOSIS — C8585 Other specified types of non-Hodgkin lymphoma, lymph nodes of inguinal region and lower limb: Secondary | ICD-10-CM | POA: Diagnosis not present

## 2019-09-07 DIAGNOSIS — R05 Cough: Secondary | ICD-10-CM | POA: Diagnosis not present

## 2019-09-07 DIAGNOSIS — J029 Acute pharyngitis, unspecified: Secondary | ICD-10-CM | POA: Diagnosis not present

## 2019-09-07 DIAGNOSIS — J90 Pleural effusion, not elsewhere classified: Secondary | ICD-10-CM | POA: Diagnosis not present

## 2019-09-07 DIAGNOSIS — J984 Other disorders of lung: Secondary | ICD-10-CM | POA: Diagnosis not present

## 2019-09-13 ENCOUNTER — Ambulatory Visit: Payer: Medicare PPO | Admitting: Cardiology

## 2019-09-13 ENCOUNTER — Encounter: Payer: Self-pay | Admitting: Cardiology

## 2019-09-13 ENCOUNTER — Other Ambulatory Visit: Payer: Self-pay

## 2019-09-13 VITALS — BP 130/50 | HR 65 | Ht 60.0 in | Wt 109.0 lb

## 2019-09-13 DIAGNOSIS — I25118 Atherosclerotic heart disease of native coronary artery with other forms of angina pectoris: Secondary | ICD-10-CM | POA: Diagnosis not present

## 2019-09-13 DIAGNOSIS — I480 Paroxysmal atrial fibrillation: Secondary | ICD-10-CM | POA: Diagnosis not present

## 2019-09-13 DIAGNOSIS — I10 Essential (primary) hypertension: Secondary | ICD-10-CM | POA: Diagnosis not present

## 2019-09-13 DIAGNOSIS — D496 Neoplasm of unspecified behavior of brain: Secondary | ICD-10-CM | POA: Diagnosis not present

## 2019-09-13 NOTE — Progress Notes (Signed)
Cardiology Office Note:    Date:  09/13/2019   ID:  Kimberly Vaughn, DOB 09-08-1945, MRN [id number]  PCP:  Ernestene Kiel, MD  Cardiologist:  Jenne Campus, MD    Referring MD: Ernestene Kiel, MD   No chief complaint on file. I am doing well concern is about blood pressure as well as low heart rate.  History of Present Illness:    Kimberly Vaughn is a 74 y.o. female with incredible past medical history which include brain lymphoma, paroxysmal atrial fibrillation, remote coronary artery disease.  She comes today to my office in follow-up.  She described to have some episode of palpitations.  Was happening "frequently before.  Another concern intense she was find to have bradycardia and she is talking about heart rate between 40 and 50.  There is no dizziness no passing out but is somewhat difficult to assess since she moves around with a walker.  Another concern is high pulse pressure blood pressure today is 130/50.  Overall cardiac wise she seems to be doing otherwise fine.  Past Medical History:  Diagnosis Date  . A-fib ([place])   . Acute deep vein thrombosis (DVT) of left upper extremity ([place]) 11/19/2016   unspecified vein, associated with PICC line  . Anginal pain ([place])   . Asthma    as a child  . BPPV (benign paroxysmal positional vertigo) 12/25/2015  . Brain tumor ([place]) 09/04/2016  . Coronary artery disease   . DDD (degenerative disc disease), lumbar   . Degenerative joint disease   . Dizziness and giddiness   . Dysrhythmia    A fib  . Episodic atrial fibrillation ([place])   . Esophageal reflux   . GERD (gastroesophageal reflux disease)   . Hiatal hernia   . History of kidney stones   . Hypertension, essential   . Kidney stones   . Liver cyst LEFT  . Myocardial infarction ([place])   . Nodule of parotid gland   . Non-Hodgkin lymphoma ([place])    Brain  . Paroxysmal atrial fibrillation ([place]) 09/01/2016   Chads2Vas score equals 3, but only 1 episodes in 2016, recent event  recorder showing no evidence of atrial fibrillation.  . Pneumonia   . PONV (postoperative nausea and vomiting)   . Pulmonary nodule   . PVC (premature ventricular contraction)    Nov 2017 & Feb 2018 noted on cardiac monitor worn at home    Past Surgical History:  Procedure Laterality Date  . APPLICATION OF CRANIAL NAVIGATION N/A 09/04/2016   Procedure: APPLICATION OF CRANIAL NAVIGATION;  Surgeon: Consuella Lose, MD;  Location: [facility];  Service: Neurosurgery;  Laterality: N/A;  . BRAIN SURGERY  09/04/2016  . BREAST BIOPSY Bilateral   . CARPAL TUNNEL RELEASE Right 09/2013  . CATARACTS Bilateral   . CERVICAL SPINE SURGERY  04/2012  . FINGER SURGERY Right    TRIGGER FINGER RELEASE  . PARTIAL THYMECTOMY  1973   pt states doesn't remember this being done  . RETROSIGMOID CRANIECTOMY FOR TUMOR RESECTION Left 09/04/2016   Procedure: RETROSIGMOID CRANIECTOMY FOR TUMOR RESECTION WITH BRAINLAB NAVIGATION;  Surgeon: Consuella Lose, MD;  Location: [facility];  Service: Neurosurgery;  Laterality: Left;  CRANIOTOMY TUMOR EXCISION  . TONSILLECTOMY  1964  . VAGINAL HYSTERECTOMY  1974    Current Medications: Current Meds  Medication Sig  . atorvastatin (LIPITOR) 80 MG tablet TAKE 1 TABLET BY MOUTH EVERY DAY  . Carboxymethylcellulose Sod PF 0.5 % SOLN Apply to eye. One drop in left eye  4 times daily.  . Cholecalciferol (VITAMIN D3) 2000 units TABS Take 2,000 Units by mouth daily.  . Cyanocobalamin (VITAMIN B-12) 5000 MCG SUBL Take 5,000 mcg by mouth daily.   Marland Kitchen erythromycin ophthalmic ointment Place 1 application into the left eye 4 (four) times daily.  Marland Kitchen esomeprazole (NEXIUM) 40 MG capsule Take 40 mg by mouth daily.  Marland Kitchen lenalidomide (REVLIMID) 10 MG capsule Take 10 mg by mouth daily.  . magnesium chloride (SLOW-MAG) 64 MG TBEC SR tablet Take 1 tablet by mouth 2 (two) times daily.   . metoprolol tartrate (LOPRESSOR) 25 MG tablet TAKE 1 TABLET(25 MG) BY MOUTH TWICE DAILY  . nitrofurantoin,  macrocrystal-monohydrate, (MACROBID) 100 MG capsule Take 1 capsule by mouth daily.  . nitroGLYCERIN (NITROSTAT) 0.4 MG SL tablet Place 1 tablet (0.4 mg total) under the tongue every 5 (five) minutes as needed for chest pain.  Marland Kitchen ondansetron (ZOFRAN) 8 MG tablet Take 8 mg by mouth. Take one tablet every 8 hours as needed for nausea  . Potassium Chloride ER 20 MEQ TBCR Take 1 tablet by mouth 2 (two) times daily. 2  In the morning and 1 at night  . rivaroxaban (XARELTO) 10 MG TABS tablet Take 10 mg by mouth daily with supper.   . sennosides-docusate sodium (SENOKOT-S) 8.6-50 MG tablet Take 1 tablet by mouth 2 (two) times daily.  . tamsulosin (FLOMAX) 0.4 MG CAPS capsule Take 0.4 mg by mouth daily.     Allergies:   Patient has no known allergies.   Social History   Socioeconomic History  . Marital status: Married    Spouse name: Not on file  . Number of children: 1  . Years of education: 72  . Highest education level: Not on file  Occupational History  . Occupation: Retired school  Tobacco Use  . Smoking status: Former Smoker    Types: Cigarettes    Quit date: 10/23/2005    Years since quitting: 13.8  . Smokeless tobacco: Never Used  Vaping Use  . Vaping Use: Never used  Substance and Sexual Activity  . Alcohol use: No  . Drug use: No  . Sexual activity: Not Currently    Comment: MARRIED  Other Topics Concern  . Not on file  Social History Narrative   Lives with husband   Caffeine use: 2 cups coffee per day   Admitted to [facility]   Former smoker - stopped 2007   Alcohol none   DNR   Social Determinants of Health   Financial Resource Strain:   . Difficulty of Paying Living Expenses:   Food Insecurity:   . Worried About Charity fundraiser in the Last Year:   . Arboriculturist in the Last Year:   Transportation Needs:   . Film/video editor (Medical):   Marland Kitchen Lack of Transportation (Non-Medical):   Physical Activity:   . Days of Exercise per Week:   .  Minutes of Exercise per Session:   Stress:   . Feeling of Stress :   Social Connections:   . Frequency of Communication with Friends and Family:   . Frequency of Social Gatherings with Friends and Family:   . Attends Religious Services:   . Active Member of Clubs or Organizations:   . Attends Archivist Meetings:   Marland Kitchen Marital Status:      Family History: The patient's family history includes Heart attack (age of onset: 17) in her father; Heart disease in her  father; Heart disease (age of onset: 61) in her mother; Hypertension in her brother, daughter, mother, sister, and another family member; Lymphoma in her sister and another family member; Melanoma in her brother and another family member; Osteoarthritis in an other family member; Psoriasis in her mother; Rheum arthritis in her brother and mother. ROS:   Please see the history of present illness.    All 14 point review of systems negative except as described per history of present illness  EKGs/Labs/Other Studies Reviewed:      Recent Labs: No results found for requested labs within last 8760 hours.  Recent Lipid Panel No results found for: CHOL, TRIG, HDL, CHOLHDL, VLDL, LDLCALC, LDLDIRECT  Physical Exam:    VS:  BP (!) 130/50   Pulse 65   Ht 5' (1.524 m)   Wt 109 lb (49.4 kg)   LMP  (LMP Unknown)   SpO2 97%   BMI 21.29 kg/m     Wt Readings from Last 3 Encounters:  09/13/19 109 lb (49.4 kg)  02/06/19 115 lb (52.2 kg)  07/21/18 122 lb (55.3 kg)     GEN:  Well nourished, well developed in no acute distress HEENT: Normal NECK: No JVD; No carotid bruits LYMPHATICS: No lymphadenopathy CARDIAC: RRR, no murmurs, no rubs, no gallops RESPIRATORY:  Clear to auscultation without rales, wheezing or rhonchi  ABDOMEN: Soft, non-tender, non-distended MUSCULOSKELETAL:  No edema; No deformity  SKIN: Warm and dry LOWER EXTREMITIES: no swelling NEUROLOGIC:  Alert and oriented x 3 PSYCHIATRIC:  Normal affect    ASSESSMENT:    1. PAF (paroxysmal atrial fibrillation) ([place])   2. Essential hypertension   3. Coronary artery disease of native artery of native heart with stable angina pectoris ([place])   4. Brain tumor ([place])    PLAN:    In order of problems listed above:  1. Paroxysmal atrial fibrillation she is anticoagulated but the dose is quite interesting in trying 10 mg of Xarelto.  I understand the reason for that is the fact that she does have brain lymphoma.  She described to have more frequent palpitations.  We will do monitor to see if she does have exactly this is atrial fibrillation.  Another reason for the monitor is to see degree of bradycardia. 2. Essential hypertension blood pressure seems to be controlled again concerns about because pressure.  Will do echocardiogram to make sure she does not have any significant aortic insufficiency which does not appear to be on the physical exam. 3. Coronary artery disease.  She described to have 1 episodes of chest pain few weeks ago.  Ask if it happened again she need to take nitroglycerin likely nothing happens. 4. Brain lymphoma: Follow-up excellently by neurology and apparently stable.   Medication Adjustments/Labs and Tests Ordered: Current medicines are reviewed at length with the patient today.  Concerns regarding medicines are outlined above.  No orders of the defined types were placed in this encounter.  Medication changes: No orders of the defined types were placed in this encounter.   Signed, Park Liter, MD, [facility] 09/13/2019 3:30 PM    [place]

## 2019-09-13 NOTE — Patient Instructions (Signed)
Medication Instructions:  Your physician recommends that you continue on your current medications as directed. Please refer to the Current Medication list given to you today.  *If you need a refill on your cardiac medications before your next appointment, please call your pharmacy*   Lab Work: None.  If you have labs (blood work) drawn today and your tests are completely normal, you will receive your results only by: . MyChart Message (if you have MyChart) OR . A paper copy in the mail If you have any lab test that is abnormal or we need to change your treatment, we will call you to review the results.   Testing/Procedures: Your physician has requested that you have an echocardiogram. Echocardiography is a painless test that uses sound waves to create images of your heart. It provides your doctor with information about the size and shape of your heart and how well your heart's chambers and valves are working. This procedure takes approximately one hour. There are no restrictions for this procedure.  A zio monitor was ordered today. It will remain on for 7 days. You will then return monitor and event diary in provided box. It takes 1-2 weeks for report to be downloaded and returned to us. We will call you with the results. If monitor falls off or has orange flashing light, please call Zio for further instructions.      Follow-Up: At [facility], you and your health needs are our priority.  As part of our continuing mission to provide you with exceptional heart care, we have created designated Provider Care Teams.  These Care Teams include your primary Cardiologist (physician) and Advanced Practice Providers (APPs -  Physician Assistants and Nurse Practitioners) who all work together to provide you with the care you need, when you need it.  We recommend signing up for the patient portal called "MyChart".  Sign up information is provided on this After Visit Summary.  MyChart is used to  connect with patients for Virtual Visits (Telemedicine).  Patients are able to view lab/test results, encounter notes, upcoming appointments, etc.  Non-urgent messages can be sent to your provider as well.   To learn more about what you can do with MyChart, go to [email or web address].    Your next appointment:   6 month(s)  The format for your next appointment:   In Person  Provider:   Robert Krasowski, MD   Other Instructions   Echocardiogram An echocardiogram is a procedure that uses painless sound waves (ultrasound) to produce an image of the heart. Images from an echocardiogram can provide important information about:  Signs of coronary artery disease (CAD).  Aneurysm detection. An aneurysm is a weak or damaged part of an artery wall that bulges out from the normal force of blood pumping through the body.  Heart size and shape. Changes in the size or shape of the heart can be associated with certain conditions, including heart failure, aneurysm, and CAD.  Heart muscle function.  Heart valve function.  Signs of a past heart attack.  Fluid buildup around the heart.  Thickening of the heart muscle.  A tumor or infectious growth around the heart valves. Tell a health care provider about:  Any allergies you have.  All medicines you are taking, including vitamins, herbs, eye drops, creams, and over-the-counter medicines.  Any blood disorders you have.  Any surgeries you have had.  Any medical conditions you have.  Whether you are pregnant or may be pregnant. What are the risks?   Generally, this is a safe procedure. However, problems may occur, including:  Allergic reaction to dye (contrast) that may be used during the procedure. What happens before the procedure? No specific preparation is needed. You may eat and drink normally. What happens during the procedure?   An IV tube may be inserted into one of your veins.  You may receive contrast through this  tube. A contrast is an injection that improves the quality of the pictures from your heart.  A gel will be applied to your chest.  A wand-like tool (transducer) will be moved over your chest. The gel will help to transmit the sound waves from the transducer.  The sound waves will harmlessly bounce off of your heart to allow the heart images to be captured in real-time motion. The images will be recorded on a computer. The procedure may vary among health care providers and hospitals. What happens after the procedure?  You may return to your normal, everyday life, including diet, activities, and medicines, unless your health care provider tells you not to do that. Summary  An echocardiogram is a procedure that uses painless sound waves (ultrasound) to produce an image of the heart.  Images from an echocardiogram can provide important information about the size and shape of your heart, heart muscle function, heart valve function, and fluid buildup around your heart.  You do not need to do anything to prepare before this procedure. You may eat and drink normally.  After the echocardiogram is completed, you may return to your normal, everyday life, unless your health care provider tells you not to do that. This information is not intended to replace advice given to you by your health care provider. Make sure you discuss any questions you have with your health care provider. Document Revised: 06/09/2018 Document Reviewed: 03/21/2016 Elsevier Patient Education  [employer].

## 2019-09-14 DIAGNOSIS — C8589 Other specified types of non-Hodgkin lymphoma, extranodal and solid organ sites: Secondary | ICD-10-CM | POA: Diagnosis not present

## 2019-09-14 DIAGNOSIS — Z79899 Other long term (current) drug therapy: Secondary | ICD-10-CM | POA: Diagnosis not present

## 2019-09-14 DIAGNOSIS — R634 Abnormal weight loss: Secondary | ICD-10-CM | POA: Diagnosis not present

## 2019-09-14 DIAGNOSIS — I2510 Atherosclerotic heart disease of native coronary artery without angina pectoris: Secondary | ICD-10-CM | POA: Diagnosis not present

## 2019-09-14 DIAGNOSIS — E878 Other disorders of electrolyte and fluid balance, not elsewhere classified: Secondary | ICD-10-CM | POA: Diagnosis not present

## 2019-09-14 DIAGNOSIS — R29810 Facial weakness: Secondary | ICD-10-CM | POA: Diagnosis not present

## 2019-09-14 DIAGNOSIS — H9192 Unspecified hearing loss, left ear: Secondary | ICD-10-CM | POA: Diagnosis not present

## 2019-09-14 DIAGNOSIS — M6530 Trigger finger, unspecified finger: Secondary | ICD-10-CM | POA: Diagnosis not present

## 2019-09-14 DIAGNOSIS — Z86718 Personal history of other venous thrombosis and embolism: Secondary | ICD-10-CM | POA: Diagnosis not present

## 2019-09-17 ENCOUNTER — Other Ambulatory Visit: Payer: Self-pay | Admitting: Cardiology

## 2019-09-18 ENCOUNTER — Ambulatory Visit (INDEPENDENT_AMBULATORY_CARE_PROVIDER_SITE_OTHER): Payer: Medicare PPO

## 2019-09-18 DIAGNOSIS — I480 Paroxysmal atrial fibrillation: Secondary | ICD-10-CM | POA: Diagnosis not present

## 2019-09-18 DIAGNOSIS — I25118 Atherosclerotic heart disease of native coronary artery with other forms of angina pectoris: Secondary | ICD-10-CM

## 2019-10-04 ENCOUNTER — Ambulatory Visit (INDEPENDENT_AMBULATORY_CARE_PROVIDER_SITE_OTHER): Payer: Medicare PPO

## 2019-10-04 ENCOUNTER — Other Ambulatory Visit: Payer: Self-pay

## 2019-10-04 DIAGNOSIS — I480 Paroxysmal atrial fibrillation: Secondary | ICD-10-CM | POA: Diagnosis not present

## 2019-10-04 DIAGNOSIS — I25118 Atherosclerotic heart disease of native coronary artery with other forms of angina pectoris: Secondary | ICD-10-CM

## 2019-10-04 LAB — ECHOCARDIOGRAM COMPLETE
Area-P 1/2: 2.6 cm2
S' Lateral: 2.6 cm

## 2019-10-04 NOTE — Progress Notes (Signed)
Complete echocardiogram has been performed.  Jimmy Keriann Rankin RDCS, RVT 

## 2019-10-06 ENCOUNTER — Telehealth: Payer: Self-pay

## 2019-10-06 NOTE — Telephone Encounter (Signed)
Spoke with patient regarding results and recommendation.  Patient verbalizes understanding and is agreeable to plan of care. Advised patient to call back with any issues or concerns.  

## 2019-10-06 NOTE — Telephone Encounter (Signed)
-----   Message from Park Liter, MD sent at 10/05/2019  2:50 PM EDT ----- Echocardiogram showed preserved left ventricle ejection fraction, mild left ventricle hypertrophy, mild to moderate mitral valve regurgitation, this is for medical therapy.

## 2019-10-11 DIAGNOSIS — G96198 Other disorders of meninges, not elsewhere classified: Secondary | ICD-10-CM | POA: Diagnosis not present

## 2019-10-11 DIAGNOSIS — G9782 Other postprocedural complications and disorders of nervous system: Secondary | ICD-10-CM | POA: Diagnosis not present

## 2019-10-11 DIAGNOSIS — I480 Paroxysmal atrial fibrillation: Secondary | ICD-10-CM | POA: Diagnosis not present

## 2019-10-11 DIAGNOSIS — G9389 Other specified disorders of brain: Secondary | ICD-10-CM | POA: Diagnosis not present

## 2019-10-11 DIAGNOSIS — C8589 Other specified types of non-Hodgkin lymphoma, extranodal and solid organ sites: Secondary | ICD-10-CM | POA: Diagnosis not present

## 2019-10-11 DIAGNOSIS — Z9889 Other specified postprocedural states: Secondary | ICD-10-CM | POA: Diagnosis not present

## 2019-10-12 DIAGNOSIS — R197 Diarrhea, unspecified: Secondary | ICD-10-CM | POA: Diagnosis not present

## 2019-10-12 DIAGNOSIS — Z86718 Personal history of other venous thrombosis and embolism: Secondary | ICD-10-CM | POA: Diagnosis not present

## 2019-10-12 DIAGNOSIS — H9192 Unspecified hearing loss, left ear: Secondary | ICD-10-CM | POA: Diagnosis not present

## 2019-10-12 DIAGNOSIS — R278 Other lack of coordination: Secondary | ICD-10-CM | POA: Diagnosis not present

## 2019-10-12 DIAGNOSIS — C717 Malignant neoplasm of brain stem: Secondary | ICD-10-CM | POA: Diagnosis not present

## 2019-10-12 DIAGNOSIS — G510 Bell's palsy: Secondary | ICD-10-CM | POA: Diagnosis not present

## 2019-10-12 DIAGNOSIS — I480 Paroxysmal atrial fibrillation: Secondary | ICD-10-CM | POA: Diagnosis not present

## 2019-10-12 DIAGNOSIS — I4891 Unspecified atrial fibrillation: Secondary | ICD-10-CM | POA: Diagnosis not present

## 2019-10-12 DIAGNOSIS — M6530 Trigger finger, unspecified finger: Secondary | ICD-10-CM | POA: Diagnosis not present

## 2019-10-12 DIAGNOSIS — C8589 Other specified types of non-Hodgkin lymphoma, extranodal and solid organ sites: Secondary | ICD-10-CM | POA: Diagnosis not present

## 2019-10-12 DIAGNOSIS — Z8679 Personal history of other diseases of the circulatory system: Secondary | ICD-10-CM | POA: Diagnosis not present

## 2019-11-01 DIAGNOSIS — H16221 Keratoconjunctivitis sicca, not specified as Sjogren's, right eye: Secondary | ICD-10-CM | POA: Diagnosis not present

## 2019-11-01 DIAGNOSIS — H401111 Primary open-angle glaucoma, right eye, mild stage: Secondary | ICD-10-CM | POA: Diagnosis not present

## 2019-11-01 DIAGNOSIS — H43391 Other vitreous opacities, right eye: Secondary | ICD-10-CM | POA: Diagnosis not present

## 2019-11-01 DIAGNOSIS — Z961 Presence of intraocular lens: Secondary | ICD-10-CM | POA: Diagnosis not present

## 2019-11-01 DIAGNOSIS — H35441 Age-related reticular degeneration of retina, right eye: Secondary | ICD-10-CM | POA: Diagnosis not present

## 2019-12-14 DIAGNOSIS — G510 Bell's palsy: Secondary | ICD-10-CM | POA: Diagnosis not present

## 2019-12-14 DIAGNOSIS — R278 Other lack of coordination: Secondary | ICD-10-CM | POA: Diagnosis not present

## 2019-12-14 DIAGNOSIS — C8589 Other specified types of non-Hodgkin lymphoma, extranodal and solid organ sites: Secondary | ICD-10-CM | POA: Diagnosis not present

## 2019-12-14 DIAGNOSIS — Z79899 Other long term (current) drug therapy: Secondary | ICD-10-CM | POA: Diagnosis not present

## 2019-12-14 DIAGNOSIS — G939 Disorder of brain, unspecified: Secondary | ICD-10-CM | POA: Diagnosis not present

## 2019-12-14 DIAGNOSIS — I2510 Atherosclerotic heart disease of native coronary artery without angina pectoris: Secondary | ICD-10-CM | POA: Diagnosis not present

## 2019-12-14 DIAGNOSIS — H9192 Unspecified hearing loss, left ear: Secondary | ICD-10-CM | POA: Diagnosis not present

## 2019-12-14 DIAGNOSIS — I4891 Unspecified atrial fibrillation: Secondary | ICD-10-CM | POA: Diagnosis not present

## 2019-12-14 DIAGNOSIS — H02409 Unspecified ptosis of unspecified eyelid: Secondary | ICD-10-CM | POA: Diagnosis not present

## 2019-12-21 DIAGNOSIS — C8589 Other specified types of non-Hodgkin lymphoma, extranodal and solid organ sites: Secondary | ICD-10-CM | POA: Diagnosis not present

## 2019-12-21 DIAGNOSIS — Z5111 Encounter for antineoplastic chemotherapy: Secondary | ICD-10-CM | POA: Diagnosis not present

## 2019-12-21 DIAGNOSIS — Z79899 Other long term (current) drug therapy: Secondary | ICD-10-CM | POA: Diagnosis not present

## 2020-01-02 DIAGNOSIS — M5031 Other cervical disc degeneration,  high cervical region: Secondary | ICD-10-CM | POA: Diagnosis not present

## 2020-01-02 DIAGNOSIS — M4802 Spinal stenosis, cervical region: Secondary | ICD-10-CM | POA: Diagnosis not present

## 2020-01-02 DIAGNOSIS — M48061 Spinal stenosis, lumbar region without neurogenic claudication: Secondary | ICD-10-CM | POA: Diagnosis not present

## 2020-01-02 DIAGNOSIS — C8589 Other specified types of non-Hodgkin lymphoma, extranodal and solid organ sites: Secondary | ICD-10-CM | POA: Diagnosis not present

## 2020-01-02 DIAGNOSIS — M47816 Spondylosis without myelopathy or radiculopathy, lumbar region: Secondary | ICD-10-CM | POA: Diagnosis not present

## 2020-01-02 DIAGNOSIS — Z981 Arthrodesis status: Secondary | ICD-10-CM | POA: Diagnosis not present

## 2020-01-02 DIAGNOSIS — R933 Abnormal findings on diagnostic imaging of other parts of digestive tract: Secondary | ICD-10-CM | POA: Diagnosis not present

## 2020-01-04 DIAGNOSIS — Z6821 Body mass index (BMI) 21.0-21.9, adult: Secondary | ICD-10-CM | POA: Diagnosis not present

## 2020-01-04 DIAGNOSIS — E785 Hyperlipidemia, unspecified: Secondary | ICD-10-CM | POA: Diagnosis not present

## 2020-01-04 DIAGNOSIS — Z1331 Encounter for screening for depression: Secondary | ICD-10-CM | POA: Diagnosis not present

## 2020-01-04 DIAGNOSIS — Z1339 Encounter for screening examination for other mental health and behavioral disorders: Secondary | ICD-10-CM | POA: Diagnosis not present

## 2020-01-04 DIAGNOSIS — C8589 Other specified types of non-Hodgkin lymphoma, extranodal and solid organ sites: Secondary | ICD-10-CM | POA: Diagnosis not present

## 2020-01-04 DIAGNOSIS — Z0000 Encounter for general adult medical examination without abnormal findings: Secondary | ICD-10-CM | POA: Diagnosis not present

## 2020-01-04 DIAGNOSIS — I25119 Atherosclerotic heart disease of native coronary artery with unspecified angina pectoris: Secondary | ICD-10-CM | POA: Diagnosis not present

## 2020-01-04 DIAGNOSIS — I10 Essential (primary) hypertension: Secondary | ICD-10-CM | POA: Diagnosis not present

## 2020-01-04 DIAGNOSIS — Z79899 Other long term (current) drug therapy: Secondary | ICD-10-CM | POA: Diagnosis not present

## 2020-01-12 ENCOUNTER — Other Ambulatory Visit: Payer: Self-pay | Admitting: Cardiology

## 2020-01-15 DIAGNOSIS — K6289 Other specified diseases of anus and rectum: Secondary | ICD-10-CM | POA: Diagnosis not present

## 2020-01-15 DIAGNOSIS — R197 Diarrhea, unspecified: Secondary | ICD-10-CM | POA: Diagnosis not present

## 2020-01-15 DIAGNOSIS — C8589 Other specified types of non-Hodgkin lymphoma, extranodal and solid organ sites: Secondary | ICD-10-CM | POA: Diagnosis not present

## 2020-01-15 DIAGNOSIS — H9192 Unspecified hearing loss, left ear: Secondary | ICD-10-CM | POA: Diagnosis not present

## 2020-01-15 DIAGNOSIS — Z959 Presence of cardiac and vascular implant and graft, unspecified: Secondary | ICD-10-CM | POA: Diagnosis not present

## 2020-01-15 DIAGNOSIS — C717 Malignant neoplasm of brain stem: Secondary | ICD-10-CM | POA: Diagnosis not present

## 2020-01-15 DIAGNOSIS — I4891 Unspecified atrial fibrillation: Secondary | ICD-10-CM | POA: Diagnosis not present

## 2020-01-15 DIAGNOSIS — M6530 Trigger finger, unspecified finger: Secondary | ICD-10-CM | POA: Diagnosis not present

## 2020-01-15 DIAGNOSIS — Z86718 Personal history of other venous thrombosis and embolism: Secondary | ICD-10-CM | POA: Diagnosis not present

## 2020-01-15 DIAGNOSIS — R278 Other lack of coordination: Secondary | ICD-10-CM | POA: Diagnosis not present

## 2020-01-15 DIAGNOSIS — G510 Bell's palsy: Secondary | ICD-10-CM | POA: Diagnosis not present

## 2020-01-18 DIAGNOSIS — C8589 Other specified types of non-Hodgkin lymphoma, extranodal and solid organ sites: Secondary | ICD-10-CM | POA: Diagnosis not present

## 2020-02-01 DIAGNOSIS — C8589 Other specified types of non-Hodgkin lymphoma, extranodal and solid organ sites: Secondary | ICD-10-CM | POA: Diagnosis not present

## 2020-02-12 DIAGNOSIS — Z7901 Long term (current) use of anticoagulants: Secondary | ICD-10-CM | POA: Diagnosis not present

## 2020-02-12 DIAGNOSIS — G510 Bell's palsy: Secondary | ICD-10-CM | POA: Diagnosis not present

## 2020-02-12 DIAGNOSIS — I4891 Unspecified atrial fibrillation: Secondary | ICD-10-CM | POA: Diagnosis not present

## 2020-02-12 DIAGNOSIS — M6530 Trigger finger, unspecified finger: Secondary | ICD-10-CM | POA: Diagnosis not present

## 2020-02-12 DIAGNOSIS — I82622 Acute embolism and thrombosis of deep veins of left upper extremity: Secondary | ICD-10-CM | POA: Diagnosis not present

## 2020-02-12 DIAGNOSIS — R197 Diarrhea, unspecified: Secondary | ICD-10-CM | POA: Diagnosis not present

## 2020-02-12 DIAGNOSIS — C8589 Other specified types of non-Hodgkin lymphoma, extranodal and solid organ sites: Secondary | ICD-10-CM | POA: Diagnosis not present

## 2020-02-12 DIAGNOSIS — C717 Malignant neoplasm of brain stem: Secondary | ICD-10-CM | POA: Diagnosis not present

## 2020-02-12 DIAGNOSIS — K529 Noninfective gastroenteritis and colitis, unspecified: Secondary | ICD-10-CM | POA: Diagnosis not present

## 2020-02-12 DIAGNOSIS — H9192 Unspecified hearing loss, left ear: Secondary | ICD-10-CM | POA: Diagnosis not present

## 2020-02-12 DIAGNOSIS — R278 Other lack of coordination: Secondary | ICD-10-CM | POA: Diagnosis not present

## 2020-02-12 DIAGNOSIS — Z86718 Personal history of other venous thrombosis and embolism: Secondary | ICD-10-CM | POA: Diagnosis not present

## 2020-02-12 DIAGNOSIS — Z959 Presence of cardiac and vascular implant and graft, unspecified: Secondary | ICD-10-CM | POA: Diagnosis not present

## 2020-02-26 DIAGNOSIS — C8589 Other specified types of non-Hodgkin lymphoma, extranodal and solid organ sites: Secondary | ICD-10-CM | POA: Diagnosis not present

## 2020-03-15 DIAGNOSIS — C8589 Other specified types of non-Hodgkin lymphoma, extranodal and solid organ sites: Secondary | ICD-10-CM | POA: Diagnosis not present

## 2020-03-21 DIAGNOSIS — C8589 Other specified types of non-Hodgkin lymphoma, extranodal and solid organ sites: Secondary | ICD-10-CM | POA: Diagnosis not present

## 2020-03-21 DIAGNOSIS — I25119 Atherosclerotic heart disease of native coronary artery with unspecified angina pectoris: Secondary | ICD-10-CM | POA: Diagnosis not present

## 2020-03-21 DIAGNOSIS — E46 Unspecified protein-calorie malnutrition: Secondary | ICD-10-CM | POA: Diagnosis not present

## 2020-03-21 DIAGNOSIS — Z86718 Personal history of other venous thrombosis and embolism: Secondary | ICD-10-CM | POA: Diagnosis not present

## 2020-03-21 DIAGNOSIS — H9193 Unspecified hearing loss, bilateral: Secondary | ICD-10-CM | POA: Diagnosis not present

## 2020-03-21 DIAGNOSIS — G510 Bell's palsy: Secondary | ICD-10-CM | POA: Diagnosis not present

## 2020-03-21 DIAGNOSIS — I252 Old myocardial infarction: Secondary | ICD-10-CM | POA: Diagnosis not present

## 2020-03-21 DIAGNOSIS — Z7901 Long term (current) use of anticoagulants: Secondary | ICD-10-CM | POA: Diagnosis not present

## 2020-03-21 DIAGNOSIS — Z955 Presence of coronary angioplasty implant and graft: Secondary | ICD-10-CM | POA: Diagnosis not present

## 2020-03-28 DIAGNOSIS — C8589 Other specified types of non-Hodgkin lymphoma, extranodal and solid organ sites: Secondary | ICD-10-CM | POA: Diagnosis not present

## 2020-03-28 DIAGNOSIS — Z5112 Encounter for antineoplastic immunotherapy: Secondary | ICD-10-CM | POA: Diagnosis not present

## 2020-03-28 DIAGNOSIS — Z79899 Other long term (current) drug therapy: Secondary | ICD-10-CM | POA: Diagnosis not present

## 2020-04-04 DIAGNOSIS — C8589 Other specified types of non-Hodgkin lymphoma, extranodal and solid organ sites: Secondary | ICD-10-CM | POA: Diagnosis not present

## 2020-04-11 DIAGNOSIS — C8589 Other specified types of non-Hodgkin lymphoma, extranodal and solid organ sites: Secondary | ICD-10-CM | POA: Diagnosis not present

## 2020-04-18 DIAGNOSIS — C8589 Other specified types of non-Hodgkin lymphoma, extranodal and solid organ sites: Secondary | ICD-10-CM | POA: Diagnosis not present

## 2020-04-19 ENCOUNTER — Other Ambulatory Visit: Payer: Self-pay | Admitting: Cardiology

## 2020-04-25 DIAGNOSIS — C8599 Non-Hodgkin lymphoma, unspecified, extranodal and solid organ sites: Secondary | ICD-10-CM | POA: Diagnosis not present

## 2020-04-25 DIAGNOSIS — C8589 Other specified types of non-Hodgkin lymphoma, extranodal and solid organ sites: Secondary | ICD-10-CM | POA: Diagnosis not present

## 2020-04-25 DIAGNOSIS — I25119 Atherosclerotic heart disease of native coronary artery with unspecified angina pectoris: Secondary | ICD-10-CM | POA: Diagnosis not present

## 2020-04-25 DIAGNOSIS — H903 Sensorineural hearing loss, bilateral: Secondary | ICD-10-CM | POA: Diagnosis not present

## 2020-04-25 DIAGNOSIS — R42 Dizziness and giddiness: Secondary | ICD-10-CM | POA: Diagnosis not present

## 2020-04-25 DIAGNOSIS — K591 Functional diarrhea: Secondary | ICD-10-CM | POA: Diagnosis not present

## 2020-04-25 DIAGNOSIS — Z993 Dependence on wheelchair: Secondary | ICD-10-CM | POA: Diagnosis not present

## 2020-04-25 DIAGNOSIS — R197 Diarrhea, unspecified: Secondary | ICD-10-CM | POA: Diagnosis not present

## 2020-04-25 DIAGNOSIS — H6123 Impacted cerumen, bilateral: Secondary | ICD-10-CM | POA: Diagnosis not present

## 2020-04-25 DIAGNOSIS — H73891 Other specified disorders of tympanic membrane, right ear: Secondary | ICD-10-CM | POA: Diagnosis not present

## 2020-04-25 DIAGNOSIS — E46 Unspecified protein-calorie malnutrition: Secondary | ICD-10-CM | POA: Diagnosis not present

## 2020-04-25 DIAGNOSIS — G510 Bell's palsy: Secondary | ICD-10-CM | POA: Diagnosis not present

## 2020-04-25 DIAGNOSIS — I252 Old myocardial infarction: Secondary | ICD-10-CM | POA: Diagnosis not present

## 2020-04-25 DIAGNOSIS — H9192 Unspecified hearing loss, left ear: Secondary | ICD-10-CM

## 2020-04-25 DIAGNOSIS — I10 Essential (primary) hypertension: Secondary | ICD-10-CM | POA: Diagnosis not present

## 2020-04-25 HISTORY — DX: Unspecified hearing loss, left ear: H91.92

## 2020-05-02 DIAGNOSIS — C8589 Other specified types of non-Hodgkin lymphoma, extranodal and solid organ sites: Secondary | ICD-10-CM | POA: Diagnosis not present

## 2020-05-15 DIAGNOSIS — Z9889 Other specified postprocedural states: Secondary | ICD-10-CM | POA: Diagnosis not present

## 2020-05-15 DIAGNOSIS — G9389 Other specified disorders of brain: Secondary | ICD-10-CM | POA: Diagnosis not present

## 2020-05-15 DIAGNOSIS — C8589 Other specified types of non-Hodgkin lymphoma, extranodal and solid organ sites: Secondary | ICD-10-CM | POA: Diagnosis not present

## 2020-05-15 DIAGNOSIS — I616 Nontraumatic intracerebral hemorrhage, multiple localized: Secondary | ICD-10-CM | POA: Diagnosis not present

## 2020-05-16 DIAGNOSIS — Z7901 Long term (current) use of anticoagulants: Secondary | ICD-10-CM | POA: Diagnosis not present

## 2020-05-16 DIAGNOSIS — R439 Unspecified disturbances of smell and taste: Secondary | ICD-10-CM | POA: Diagnosis not present

## 2020-05-16 DIAGNOSIS — C8599 Non-Hodgkin lymphoma, unspecified, extranodal and solid organ sites: Secondary | ICD-10-CM | POA: Diagnosis not present

## 2020-05-16 DIAGNOSIS — Z5112 Encounter for antineoplastic immunotherapy: Secondary | ICD-10-CM | POA: Diagnosis not present

## 2020-05-16 DIAGNOSIS — I10 Essential (primary) hypertension: Secondary | ICD-10-CM | POA: Diagnosis not present

## 2020-05-16 DIAGNOSIS — H903 Sensorineural hearing loss, bilateral: Secondary | ICD-10-CM | POA: Diagnosis not present

## 2020-05-16 DIAGNOSIS — C8519 Unspecified B-cell lymphoma, extranodal and solid organ sites: Secondary | ICD-10-CM | POA: Diagnosis not present

## 2020-05-16 DIAGNOSIS — R197 Diarrhea, unspecified: Secondary | ICD-10-CM | POA: Diagnosis not present

## 2020-05-16 DIAGNOSIS — Z86718 Personal history of other venous thrombosis and embolism: Secondary | ICD-10-CM | POA: Diagnosis not present

## 2020-05-16 DIAGNOSIS — I25119 Atherosclerotic heart disease of native coronary artery with unspecified angina pectoris: Secondary | ICD-10-CM | POA: Diagnosis not present

## 2020-05-16 DIAGNOSIS — R42 Dizziness and giddiness: Secondary | ICD-10-CM | POA: Diagnosis not present

## 2020-05-16 DIAGNOSIS — Z95828 Presence of other vascular implants and grafts: Secondary | ICD-10-CM | POA: Diagnosis not present

## 2020-05-16 DIAGNOSIS — I252 Old myocardial infarction: Secondary | ICD-10-CM | POA: Diagnosis not present

## 2020-05-16 DIAGNOSIS — G510 Bell's palsy: Secondary | ICD-10-CM | POA: Diagnosis not present

## 2020-05-16 DIAGNOSIS — E46 Unspecified protein-calorie malnutrition: Secondary | ICD-10-CM | POA: Diagnosis not present

## 2020-05-16 DIAGNOSIS — H9192 Unspecified hearing loss, left ear: Secondary | ICD-10-CM | POA: Diagnosis not present

## 2020-06-03 DIAGNOSIS — H401111 Primary open-angle glaucoma, right eye, mild stage: Secondary | ICD-10-CM | POA: Diagnosis not present

## 2020-06-03 DIAGNOSIS — H16221 Keratoconjunctivitis sicca, not specified as Sjogren's, right eye: Secondary | ICD-10-CM | POA: Diagnosis not present

## 2020-06-03 DIAGNOSIS — Z961 Presence of intraocular lens: Secondary | ICD-10-CM | POA: Diagnosis not present

## 2020-06-03 DIAGNOSIS — H43391 Other vitreous opacities, right eye: Secondary | ICD-10-CM | POA: Diagnosis not present

## 2020-06-03 DIAGNOSIS — H35441 Age-related reticular degeneration of retina, right eye: Secondary | ICD-10-CM | POA: Diagnosis not present

## 2020-06-06 DIAGNOSIS — C8589 Other specified types of non-Hodgkin lymphoma, extranodal and solid organ sites: Secondary | ICD-10-CM | POA: Diagnosis not present

## 2020-06-18 DIAGNOSIS — M5136 Other intervertebral disc degeneration, lumbar region: Secondary | ICD-10-CM | POA: Insufficient documentation

## 2020-06-18 DIAGNOSIS — R911 Solitary pulmonary nodule: Secondary | ICD-10-CM | POA: Insufficient documentation

## 2020-06-18 DIAGNOSIS — M1990 Unspecified osteoarthritis, unspecified site: Secondary | ICD-10-CM | POA: Insufficient documentation

## 2020-06-18 DIAGNOSIS — C8590 Non-Hodgkin lymphoma, unspecified, unspecified site: Secondary | ICD-10-CM | POA: Insufficient documentation

## 2020-06-18 DIAGNOSIS — Z9889 Other specified postprocedural states: Secondary | ICD-10-CM | POA: Insufficient documentation

## 2020-06-18 DIAGNOSIS — K219 Gastro-esophageal reflux disease without esophagitis: Secondary | ICD-10-CM | POA: Insufficient documentation

## 2020-06-18 DIAGNOSIS — K449 Diaphragmatic hernia without obstruction or gangrene: Secondary | ICD-10-CM | POA: Insufficient documentation

## 2020-06-18 DIAGNOSIS — M51369 Other intervertebral disc degeneration, lumbar region without mention of lumbar back pain or lower extremity pain: Secondary | ICD-10-CM | POA: Insufficient documentation

## 2020-06-18 DIAGNOSIS — J189 Pneumonia, unspecified organism: Secondary | ICD-10-CM | POA: Insufficient documentation

## 2020-06-18 DIAGNOSIS — I4891 Unspecified atrial fibrillation: Secondary | ICD-10-CM | POA: Insufficient documentation

## 2020-06-18 DIAGNOSIS — Z7409 Other reduced mobility: Secondary | ICD-10-CM | POA: Insufficient documentation

## 2020-06-18 DIAGNOSIS — I480 Paroxysmal atrial fibrillation: Secondary | ICD-10-CM | POA: Insufficient documentation

## 2020-06-18 DIAGNOSIS — K118 Other diseases of salivary glands: Secondary | ICD-10-CM | POA: Insufficient documentation

## 2020-06-18 DIAGNOSIS — I10 Essential (primary) hypertension: Secondary | ICD-10-CM | POA: Insufficient documentation

## 2020-06-18 DIAGNOSIS — R112 Nausea with vomiting, unspecified: Secondary | ICD-10-CM | POA: Insufficient documentation

## 2020-06-18 DIAGNOSIS — K7689 Other specified diseases of liver: Secondary | ICD-10-CM | POA: Insufficient documentation

## 2020-06-18 DIAGNOSIS — I219 Acute myocardial infarction, unspecified: Secondary | ICD-10-CM | POA: Insufficient documentation

## 2020-06-18 DIAGNOSIS — I499 Cardiac arrhythmia, unspecified: Secondary | ICD-10-CM | POA: Insufficient documentation

## 2020-06-18 DIAGNOSIS — I209 Angina pectoris, unspecified: Secondary | ICD-10-CM | POA: Insufficient documentation

## 2020-06-18 DIAGNOSIS — Z789 Other specified health status: Secondary | ICD-10-CM

## 2020-06-18 DIAGNOSIS — R42 Dizziness and giddiness: Secondary | ICD-10-CM | POA: Insufficient documentation

## 2020-06-18 DIAGNOSIS — N200 Calculus of kidney: Secondary | ICD-10-CM | POA: Insufficient documentation

## 2020-06-18 DIAGNOSIS — J45909 Unspecified asthma, uncomplicated: Secondary | ICD-10-CM | POA: Insufficient documentation

## 2020-06-18 DIAGNOSIS — Z87442 Personal history of urinary calculi: Secondary | ICD-10-CM | POA: Insufficient documentation

## 2020-06-18 HISTORY — DX: Other reduced mobility: Z74.09

## 2020-06-18 HISTORY — DX: Other specified health status: Z78.9

## 2020-06-20 DIAGNOSIS — R42 Dizziness and giddiness: Secondary | ICD-10-CM | POA: Diagnosis not present

## 2020-06-20 DIAGNOSIS — I10 Essential (primary) hypertension: Secondary | ICD-10-CM | POA: Diagnosis not present

## 2020-06-20 DIAGNOSIS — G510 Bell's palsy: Secondary | ICD-10-CM | POA: Diagnosis not present

## 2020-06-20 DIAGNOSIS — C8589 Other specified types of non-Hodgkin lymphoma, extranodal and solid organ sites: Secondary | ICD-10-CM | POA: Diagnosis not present

## 2020-06-20 DIAGNOSIS — R197 Diarrhea, unspecified: Secondary | ICD-10-CM | POA: Diagnosis not present

## 2020-06-20 DIAGNOSIS — E46 Unspecified protein-calorie malnutrition: Secondary | ICD-10-CM | POA: Diagnosis not present

## 2020-06-20 DIAGNOSIS — H903 Sensorineural hearing loss, bilateral: Secondary | ICD-10-CM | POA: Diagnosis not present

## 2020-06-20 DIAGNOSIS — I25119 Atherosclerotic heart disease of native coronary artery with unspecified angina pectoris: Secondary | ICD-10-CM | POA: Diagnosis not present

## 2020-06-20 DIAGNOSIS — I252 Old myocardial infarction: Secondary | ICD-10-CM | POA: Diagnosis not present

## 2020-06-21 ENCOUNTER — Ambulatory Visit: Payer: Medicare PPO | Admitting: Cardiology

## 2020-06-21 ENCOUNTER — Other Ambulatory Visit: Payer: Self-pay

## 2020-06-21 ENCOUNTER — Encounter: Payer: Self-pay | Admitting: Cardiology

## 2020-06-21 VITALS — BP 130/60 | HR 56 | Ht 61.0 in | Wt 109.0 lb

## 2020-06-21 DIAGNOSIS — C8589 Other specified types of non-Hodgkin lymphoma, extranodal and solid organ sites: Secondary | ICD-10-CM

## 2020-06-21 DIAGNOSIS — I10 Essential (primary) hypertension: Secondary | ICD-10-CM | POA: Diagnosis not present

## 2020-06-21 DIAGNOSIS — I2510 Atherosclerotic heart disease of native coronary artery without angina pectoris: Secondary | ICD-10-CM

## 2020-06-21 DIAGNOSIS — I480 Paroxysmal atrial fibrillation: Secondary | ICD-10-CM | POA: Diagnosis not present

## 2020-06-21 NOTE — Progress Notes (Signed)
ekg 

## 2020-06-21 NOTE — Patient Instructions (Signed)
Medication Instructions:  Your physician recommends that you continue on your current medications as directed. Please refer to the Current Medication list given to you today.  *If you need a refill on your cardiac medications before your next appointment, please call your pharmacy*   Lab Work: NONE If you have labs (blood work) drawn today and your tests are completely normal, you will receive your results only by: MyChart Message (if you have MyChart) OR A paper copy in the mail If you have any lab test that is abnormal or we need to change your treatment, we will call you to review the results.   Testing/Procedures: EKG   Follow-Up: At [facility], you and your health needs are our priority.  As part of our continuing mission to provide you with exceptional heart care, we have created designated Provider Care Teams.  These Care Teams include your primary Cardiologist (physician) and Advanced Practice Providers (APPs -  Physician Assistants and Nurse Practitioners) who all work together to provide you with the care you need, when you need it.  We recommend signing up for the patient portal called "MyChart".  Sign up information is provided on this After Visit Summary.  MyChart is used to connect with patients for Virtual Visits (Telemedicine).  Patients are able to view lab/test results, encounter notes, upcoming appointments, etc.  Non-urgent messages can be sent to your provider as well.   To learn more about what you can do with MyChart, go to [email or web address].    Your next appointment:   6 month(s)  The format for your next appointment:   In Person  Provider:   Robert Krasowski, MD   Other Instructions   

## 2020-06-21 NOTE — Progress Notes (Signed)
Cardiology Office Note:    Date:  06/21/2020   ID:  Kimberly Vaughn, DOB 1945-12-13, MRN [id number]  PCP:  Ernestene Kiel, MD  Cardiologist:  Jenne Campus, MD    Referring MD: Ernestene Kiel, MD   Chief Complaint  Patient presents with  . Follow-up  I am doing well  History of Present Illness:    Atiyana Vaughn is a 75 y.o. female with quite incredible past medical history.  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 She comes today 2 months for follow-up.  Cardiac wise she says she is doing well denies have any chest pain tightness squeezing pressure burning chest no palpitations no dizziness overall she is local is very optimistic and cheerful.  Past Medical History:  Diagnosis Date  . A-fib ([place])   . Acute blood loss anemia 03/16/2017  . Acute cystitis without hematuria 06/06/2017  . Acute deep vein thrombosis (DVT) of left upper extremity ([place]) 11/19/2016   unspecified vein, associated with PICC line  . Acute megaloblastic anemia due to severe illness 11/12/2016  . Anemia associated with chemotherapy 11/26/2016  . Angina pectoris ([place]) 03/09/2016  . Anginal pain ([place])   . Asthma    as a child  . Asymmetric SNHL (sensorineural hearing loss) 04/15/2016  . Ataxia 03/16/2017  . Bacteremia due to methicillin susceptible Staphylococcus aureus (MSSA) 11/12/2016  . Benign essential HTN   . Bilateral impacted cerumen 01/05/2017  . Bilateral lower extremity edema 11/12/2016  . BPPV (benign paroxysmal positional vertigo) 12/25/2015  . Brain mass 09/09/2016  . Brain tumor ([place]) 09/04/2016  . Corneal abrasion, left, sequela 12/03/2016  . Coronary artery disease   . Coronary artery disease involving native coronary artery of native heart 03/30/2016   Formatting of this note might be different from the original. PTCA and drug-eluting stents to right coronary artery in January  2018 Formatting of this note might be different from the original. Overview:  PTCA and drug-eluting stents to right coronary artery in January 2018 Formatting of this note might be different from the original. Overview:  Drug-eluting stents to right coronary artery January  . Cranial nerve VI palsy, left 11/26/2016  . Cranial nerve VII palsy 11/26/2016   Formatting of this note might be different from the original. Added automatically from request for surgery [phone]  . DDD (degenerative disc disease), lumbar   . Debility   . Degenerative joint disease   . Diarrhea 03/16/2017  . Diffuse large B cell lymphoma ([place]) 09/16/2016  . Dizziness and giddiness   . DSAP (disseminated superficial actinic porokeratosis) 11/14/2016  . Dyslipidemia (high LDL; low HDL) 03/30/2016  . Dysphagia   . Dysrhythmia    A fib  . Encounter for antineoplastic chemotherapy 04/22/2017  . Episodic atrial fibrillation ([place])   . Esophageal reflux   . Essential hypertension 09/01/2016  . Gastroesophageal reflux disease   . GERD (gastroesophageal reflux disease)   . Hiatal hernia   . History of brain tumor   . History of coronary artery stent placement 01/07/2017  . History of kidney stones   . History of MI (myocardial infarction)   . Hyperlipidemia 11/12/2016  . Hypertension, essential   . Hypoalbuminemia due to protein-calorie malnutrition ([place])   . Hypokalemia   . Impaired mobility and ADLs 06/18/2020  . Kidney stones   . Klebsiella cystitis 11/12/2016  . Labile blood pressure   . Lagophthalmos of left upper eyelid  12/03/2016  . Leucocytosis   . Liver cyst LEFT  . Mass of brain 09/04/2016  . Myocardial infarction ([place])   . Nodule of parotid gland   . Non-Hodgkin lymphoma ([place])    Brain  . Orthostatic hypotension 03/16/2017  . PAF (paroxysmal atrial fibrillation) ([place])   . Pancytopenia ([place]) 11/12/2016  . Paralytic lagophthalmos of left upper eyelid 03/16/2017   Formatting of this note might be different from the  original. Added automatically from request for surgery [id number]  . Parotid duct obstruction 04/15/2016  . Paroxysmal atrial fibrillation ([place]) 09/01/2016   Chads2Vas score equals 3, but only 1 episodes in 2016, recent event recorder showing no evidence of atrial fibrillation.  . Pneumonia   . PONV (postoperative nausea and vomiting)   . Post-operative pain   . Premature atrial complex   . Primary CNS lymphoma ([place]) 09/24/2016  . Pulmonary nodule   . PVC (premature ventricular contraction)    Nov 2017 & Feb 2018 noted on cardiac monitor worn at home  . Pyelonephritis   . Sepsis ([place]) 02/15/2018  . Severe sepsis ([place]) 11/12/2016  . Skipped heart beats 01/07/2017  . Steroid-induced hyperglycemia   . Subjective tinnitus of left ear 11/27/2015  . Sudden hearing loss, left 02/28/2016  . Trigger ring finger of right hand 07/07/2018  . Unilateral deafness, left 04/25/2020  . Unilateral facial paresis 07/01/2016  . Vitamin D deficiency 11/26/2016    Past Surgical History:  Procedure Laterality Date  . APPLICATION OF CRANIAL NAVIGATION N/A 09/04/2016   Procedure: APPLICATION OF CRANIAL NAVIGATION;  Surgeon: Consuella Lose, MD;  Location: [facility];  Service: Neurosurgery;  Laterality: N/A;  . BRAIN SURGERY  09/04/2016  . BREAST BIOPSY Bilateral   . CARPAL TUNNEL RELEASE Right 09/2013  . CATARACTS Bilateral   . CERVICAL SPINE SURGERY  04/2012  . FINGER SURGERY Right    TRIGGER FINGER RELEASE  . PARTIAL THYMECTOMY  1973   pt states doesn't remember this being done  . RETROSIGMOID CRANIECTOMY FOR TUMOR RESECTION Left 09/04/2016   Procedure: RETROSIGMOID CRANIECTOMY FOR TUMOR RESECTION WITH BRAINLAB NAVIGATION;  Surgeon: Consuella Lose, MD;  Location: [facility];  Service: Neurosurgery;  Laterality: Left;  CRANIOTOMY TUMOR EXCISION  . TONSILLECTOMY  1964  . VAGINAL HYSTERECTOMY  1974    Current Medications: Current Meds  Medication Sig  . atorvastatin (LIPITOR) 80 MG tablet TAKE 1 TABLET BY MOUTH EVERY  DAY (Patient taking differently: Take 80 mg by mouth daily.)  . Carboxymethylcellulose Sod PF 0.5 % SOLN Apply 1 drop to eye 4 (four) times daily. One drop in left eye 4 times daily.  . Cholecalciferol (VITAMIN D3) 2000 units TABS Take 2,000 Units by mouth daily.  . Cyanocobalamin (VITAMIN B-12) 5000 MCG SUBL Take 5,000 mcg by mouth daily.   Marland Kitchen erythromycin ophthalmic ointment Place 1 application into the left eye 4 (four) times daily.  Marland Kitchen esomeprazole (NEXIUM) 40 MG capsule Take 40 mg by mouth daily.  Marland Kitchen lenalidomide (REVLIMID) 10 MG capsule Take 10 mg by mouth daily.  . magnesium chloride (SLOW-MAG) 64 MG TBEC SR tablet Take 1 tablet by mouth 2 (two) times daily.   . metoprolol tartrate (LOPRESSOR) 25 MG tablet TAKE 1 TABLET(25 MG) BY MOUTH TWICE DAILY (Patient taking differently: Take 25 mg by mouth 2 (two) times daily.)  . nitrofurantoin, macrocrystal-monohydrate, (MACROBID) 100 MG capsule Take 100 mg by mouth daily.  . nitroGLYCERIN (NITROSTAT) 0.4 MG SL tablet Place 1 tablet (0.4 mg total) under the tongue every  5 (five) minutes as needed for chest pain.  Marland Kitchen Potassium Chloride ER 20 MEQ TBCR Take 1 tablet by mouth 2 (two) times daily. 2  In the morning and 1 at night  . rivaroxaban (XARELTO) 10 MG TABS tablet Take 10 mg by mouth daily with supper.   . sennosides-docusate sodium (SENOKOT-S) 8.6-50 MG tablet Take 1 tablet by mouth 2 (two) times daily.  . tamsulosin (FLOMAX) 0.4 MG CAPS capsule Take 0.4 mg by mouth daily.     Allergies:   Patient has no known allergies.   Social History   Socioeconomic History  . Marital status: Married    Spouse name: Not on file  . Number of children: 1  . Years of education: 43  . Highest education level: Not on file  Occupational History  . Occupation: Retired school  Tobacco Use  . Smoking status: Former Smoker    Types: Cigarettes    Quit date: 10/23/2005    Years since quitting: 14.6  . Smokeless tobacco: Never Used  Vaping Use  . Vaping Use:  Never used  Substance and Sexual Activity  . Alcohol use: No  . Drug use: No  . Sexual activity: Not Currently    Comment: MARRIED  Other Topics Concern  . Not on file  Social History Narrative   Lives with husband   Caffeine use: 2 cups coffee per day   Admitted to [facility]   Former smoker - stopped 2007   Alcohol none   DNR   Social Determinants of Health   Financial Resource Strain: Not on file  Food Insecurity: Not on file  Transportation Needs: Not on file  Physical Activity: Not on file  Stress: Not on file  Social Connections: Not on file     Family History: The patient's family history includes Heart attack (age of onset: 40) in her father; Heart disease in her father; Heart disease (age of onset: 72) in her mother; Hypertension in her brother, daughter, mother, sister, and another family member; Lymphoma in her sister and another family member; Melanoma in her brother and another family member; Osteoarthritis in an other family member; Psoriasis in her mother; Rheum arthritis in her brother and mother. ROS:   Please see the history of present illness.    All 14 point review of systems negative except as described per history of present illness  EKGs/Labs/Other Studies Reviewed:      Recent Labs: No results found for requested labs within last 8760 hours.  Recent Lipid Panel No results found for: CHOL, TRIG, HDL, CHOLHDL, VLDL, LDLCALC, LDLDIRECT  Physical Exam:    VS:  BP 130/60 (BP Location: Left Arm, Patient Position: Sitting)   Pulse (!) 56   Ht 5\' 1"  (1.549 m)   Wt 109 lb (49.4 kg)   LMP  (LMP Unknown)   SpO2 98%   BMI 20.60 kg/m     Wt Readings from Last 3 Encounters:  06/21/20 109 lb (49.4 kg)  09/13/19 109 lb (49.4 kg)  02/06/19 115 lb (52.2 kg)     GEN:  Well nourished, well developed in no acute distress HEENT: Normal NECK: No JVD; No carotid bruits LYMPHATICS: No lymphadenopathy CARDIAC: RRR, no murmurs, no rubs, no  gallops RESPIRATORY:  Clear to auscultation without rales, wheezing or rhonchi  ABDOMEN: Soft, non-tender, non-distended MUSCULOSKELETAL:  No edema; No deformity  SKIN: Warm and dry LOWER EXTREMITIES: no swelling NEUROLOGIC:  Alert and oriented x 3 PSYCHIATRIC:  Normal  affect   ASSESSMENT:    1. PAF (paroxysmal atrial fibrillation) (HCC)   2. Paroxysmal atrial fibrillation ([place])   3. Essential hypertension   4. Coronary artery disease involving native coronary artery of native heart without angina pectoris   5. Primary CNS lymphoma ([place])    PLAN:    In order of problems listed above:  1. Paroxysmal atrial fibrillation she is maintaining sinus rhythm.  She is anticoagulated with Xarelto however dose is only 10 mg daily and honestly I am afraid to put her on full dose of anticoagulation because of her brain lymphoma.  She likely does not have any recurrences of palpitations.  She did wear monitor a few months ago which showed no evidence of atrial fibrillation just SVT. 2.  Blood pressure seems to be controlled continue present management. 3. Coronary disease stable from that point review and appropriate medication which I will continue. 4.  Primary CNS lymphoma followed by internal medicine team as well as oncology.  Medication Adjustments/Labs and Tests Ordered: Current medicines are reviewed at length with the patient today.  Concerns regarding medicines are outlined above.  Orders Placed This Encounter  Procedures  . EKG 12-Lead   Medication changes: No orders of the defined types were placed in this encounter.   Signed, Park Liter, MD, [facility] 06/21/2020 11:26 AM    [place]

## 2020-07-09 DIAGNOSIS — C8589 Other specified types of non-Hodgkin lymphoma, extranodal and solid organ sites: Secondary | ICD-10-CM | POA: Diagnosis not present

## 2020-07-23 DIAGNOSIS — L821 Other seborrheic keratosis: Secondary | ICD-10-CM | POA: Diagnosis not present

## 2020-07-23 DIAGNOSIS — L565 Disseminated superficial actinic porokeratosis (DSAP): Secondary | ICD-10-CM | POA: Diagnosis not present

## 2020-07-23 DIAGNOSIS — D692 Other nonthrombocytopenic purpura: Secondary | ICD-10-CM | POA: Diagnosis not present

## 2020-07-25 DIAGNOSIS — Z86718 Personal history of other venous thrombosis and embolism: Secondary | ICD-10-CM | POA: Diagnosis not present

## 2020-07-25 DIAGNOSIS — R197 Diarrhea, unspecified: Secondary | ICD-10-CM | POA: Diagnosis not present

## 2020-07-25 DIAGNOSIS — Z7901 Long term (current) use of anticoagulants: Secondary | ICD-10-CM | POA: Diagnosis not present

## 2020-07-25 DIAGNOSIS — R42 Dizziness and giddiness: Secondary | ICD-10-CM | POA: Diagnosis not present

## 2020-07-25 DIAGNOSIS — D649 Anemia, unspecified: Secondary | ICD-10-CM | POA: Diagnosis not present

## 2020-07-25 DIAGNOSIS — Z95828 Presence of other vascular implants and grafts: Secondary | ICD-10-CM | POA: Diagnosis not present

## 2020-07-25 DIAGNOSIS — R439 Unspecified disturbances of smell and taste: Secondary | ICD-10-CM | POA: Diagnosis not present

## 2020-07-25 DIAGNOSIS — I252 Old myocardial infarction: Secondary | ICD-10-CM | POA: Diagnosis not present

## 2020-07-25 DIAGNOSIS — D72819 Decreased white blood cell count, unspecified: Secondary | ICD-10-CM | POA: Diagnosis not present

## 2020-07-25 DIAGNOSIS — R600 Localized edema: Secondary | ICD-10-CM | POA: Diagnosis not present

## 2020-07-25 DIAGNOSIS — Z5112 Encounter for antineoplastic immunotherapy: Secondary | ICD-10-CM | POA: Diagnosis not present

## 2020-07-25 DIAGNOSIS — I25119 Atherosclerotic heart disease of native coronary artery with unspecified angina pectoris: Secondary | ICD-10-CM | POA: Diagnosis not present

## 2020-07-25 DIAGNOSIS — C8589 Other specified types of non-Hodgkin lymphoma, extranodal and solid organ sites: Secondary | ICD-10-CM | POA: Diagnosis not present

## 2020-07-25 DIAGNOSIS — I10 Essential (primary) hypertension: Secondary | ICD-10-CM | POA: Diagnosis not present

## 2020-07-25 DIAGNOSIS — G510 Bell's palsy: Secondary | ICD-10-CM | POA: Diagnosis not present

## 2020-07-25 DIAGNOSIS — H903 Sensorineural hearing loss, bilateral: Secondary | ICD-10-CM | POA: Diagnosis not present

## 2020-07-25 DIAGNOSIS — E46 Unspecified protein-calorie malnutrition: Secondary | ICD-10-CM | POA: Diagnosis not present

## 2020-08-19 DIAGNOSIS — C8589 Other specified types of non-Hodgkin lymphoma, extranodal and solid organ sites: Secondary | ICD-10-CM | POA: Diagnosis not present

## 2020-09-03 DIAGNOSIS — G9389 Other specified disorders of brain: Secondary | ICD-10-CM | POA: Diagnosis not present

## 2020-09-03 DIAGNOSIS — G319 Degenerative disease of nervous system, unspecified: Secondary | ICD-10-CM | POA: Diagnosis not present

## 2020-09-03 DIAGNOSIS — C8589 Other specified types of non-Hodgkin lymphoma, extranodal and solid organ sites: Secondary | ICD-10-CM | POA: Diagnosis not present

## 2020-09-05 DIAGNOSIS — Z9221 Personal history of antineoplastic chemotherapy: Secondary | ICD-10-CM | POA: Diagnosis not present

## 2020-09-05 DIAGNOSIS — G510 Bell's palsy: Secondary | ICD-10-CM | POA: Diagnosis not present

## 2020-09-05 DIAGNOSIS — R278 Other lack of coordination: Secondary | ICD-10-CM | POA: Diagnosis not present

## 2020-09-05 DIAGNOSIS — Z7901 Long term (current) use of anticoagulants: Secondary | ICD-10-CM | POA: Diagnosis not present

## 2020-09-05 DIAGNOSIS — I82622 Acute embolism and thrombosis of deep veins of left upper extremity: Secondary | ICD-10-CM | POA: Diagnosis not present

## 2020-09-05 DIAGNOSIS — Z86718 Personal history of other venous thrombosis and embolism: Secondary | ICD-10-CM | POA: Diagnosis not present

## 2020-09-05 DIAGNOSIS — H9190 Unspecified hearing loss, unspecified ear: Secondary | ICD-10-CM | POA: Diagnosis not present

## 2020-09-05 DIAGNOSIS — C8589 Other specified types of non-Hodgkin lymphoma, extranodal and solid organ sites: Secondary | ICD-10-CM | POA: Diagnosis not present

## 2020-09-05 DIAGNOSIS — R439 Unspecified disturbances of smell and taste: Secondary | ICD-10-CM | POA: Diagnosis not present

## 2020-09-05 DIAGNOSIS — H57812 Brow ptosis, left: Secondary | ICD-10-CM | POA: Diagnosis not present

## 2020-09-19 DIAGNOSIS — C8589 Other specified types of non-Hodgkin lymphoma, extranodal and solid organ sites: Secondary | ICD-10-CM | POA: Diagnosis not present

## 2020-09-30 ENCOUNTER — Telehealth: Payer: Self-pay | Admitting: Cardiology

## 2020-09-30 MED ORDER — ATORVASTATIN CALCIUM 80 MG PO TABS: 80.0000 mg | ORAL_TABLET | Freq: Every day | ORAL | 3 refills | Status: DC

## 2020-09-30 NOTE — Telephone Encounter (Signed)
*  STAT* If patient is at the pharmacy, call can be transferred to refill team.   1. Which medications need to be refilled? (please list name of each medication and dose if known) atorvastatin (LIPITOR) 80 MG tablet  2. Which pharmacy/location (including street and city if local pharmacy) is medication to be sent to?  [facility] [place], [place]  3. Do they need a 30 day or 90 day supply? [place]

## 2020-09-30 NOTE — Telephone Encounter (Signed)
Refill sent in per request.  

## 2020-10-03 DIAGNOSIS — L0100 Impetigo, unspecified: Secondary | ICD-10-CM | POA: Diagnosis not present

## 2020-10-03 DIAGNOSIS — L0889 Other specified local infections of the skin and subcutaneous tissue: Secondary | ICD-10-CM | POA: Diagnosis not present

## 2020-10-03 DIAGNOSIS — L565 Disseminated superficial actinic porokeratosis (DSAP): Secondary | ICD-10-CM | POA: Diagnosis not present

## 2020-10-10 DIAGNOSIS — D649 Anemia, unspecified: Secondary | ICD-10-CM | POA: Diagnosis not present

## 2020-10-10 DIAGNOSIS — I25119 Atherosclerotic heart disease of native coronary artery with unspecified angina pectoris: Secondary | ICD-10-CM | POA: Diagnosis not present

## 2020-10-10 DIAGNOSIS — H02402 Unspecified ptosis of left eyelid: Secondary | ICD-10-CM | POA: Diagnosis not present

## 2020-10-10 DIAGNOSIS — C8589 Other specified types of non-Hodgkin lymphoma, extranodal and solid organ sites: Secondary | ICD-10-CM | POA: Diagnosis not present

## 2020-10-10 DIAGNOSIS — R600 Localized edema: Secondary | ICD-10-CM | POA: Diagnosis not present

## 2020-10-10 DIAGNOSIS — R609 Edema, unspecified: Secondary | ICD-10-CM | POA: Diagnosis not present

## 2020-10-10 DIAGNOSIS — D701 Agranulocytosis secondary to cancer chemotherapy: Secondary | ICD-10-CM | POA: Diagnosis not present

## 2020-10-10 DIAGNOSIS — T451X5A Adverse effect of antineoplastic and immunosuppressive drugs, initial encounter: Secondary | ICD-10-CM | POA: Diagnosis not present

## 2020-10-10 DIAGNOSIS — H9190 Unspecified hearing loss, unspecified ear: Secondary | ICD-10-CM | POA: Diagnosis not present

## 2020-10-10 DIAGNOSIS — D6481 Anemia due to antineoplastic chemotherapy: Secondary | ICD-10-CM | POA: Diagnosis not present

## 2020-10-10 DIAGNOSIS — G510 Bell's palsy: Secondary | ICD-10-CM | POA: Diagnosis not present

## 2020-10-10 DIAGNOSIS — R439 Unspecified disturbances of smell and taste: Secondary | ICD-10-CM | POA: Diagnosis not present

## 2020-10-10 DIAGNOSIS — L089 Local infection of the skin and subcutaneous tissue, unspecified: Secondary | ICD-10-CM | POA: Diagnosis not present

## 2020-10-10 DIAGNOSIS — Z86718 Personal history of other venous thrombosis and embolism: Secondary | ICD-10-CM | POA: Diagnosis not present

## 2020-10-22 ENCOUNTER — Other Ambulatory Visit: Payer: Self-pay | Admitting: Cardiology

## 2020-10-24 DIAGNOSIS — C8589 Other specified types of non-Hodgkin lymphoma, extranodal and solid organ sites: Secondary | ICD-10-CM | POA: Diagnosis not present

## 2020-11-14 DIAGNOSIS — R439 Unspecified disturbances of smell and taste: Secondary | ICD-10-CM | POA: Diagnosis not present

## 2020-11-14 DIAGNOSIS — H9190 Unspecified hearing loss, unspecified ear: Secondary | ICD-10-CM | POA: Diagnosis not present

## 2020-11-14 DIAGNOSIS — D701 Agranulocytosis secondary to cancer chemotherapy: Secondary | ICD-10-CM | POA: Diagnosis not present

## 2020-11-14 DIAGNOSIS — C8589 Other specified types of non-Hodgkin lymphoma, extranodal and solid organ sites: Secondary | ICD-10-CM | POA: Diagnosis not present

## 2020-11-14 DIAGNOSIS — R600 Localized edema: Secondary | ICD-10-CM | POA: Diagnosis not present

## 2020-11-14 DIAGNOSIS — H02402 Unspecified ptosis of left eyelid: Secondary | ICD-10-CM | POA: Diagnosis not present

## 2020-11-14 DIAGNOSIS — D6481 Anemia due to antineoplastic chemotherapy: Secondary | ICD-10-CM | POA: Diagnosis not present

## 2020-11-14 DIAGNOSIS — G510 Bell's palsy: Secondary | ICD-10-CM | POA: Diagnosis not present

## 2020-11-14 DIAGNOSIS — Z7901 Long term (current) use of anticoagulants: Secondary | ICD-10-CM | POA: Diagnosis not present

## 2020-11-14 DIAGNOSIS — T451X5A Adverse effect of antineoplastic and immunosuppressive drugs, initial encounter: Secondary | ICD-10-CM | POA: Diagnosis not present

## 2020-11-14 DIAGNOSIS — R278 Other lack of coordination: Secondary | ICD-10-CM | POA: Diagnosis not present

## 2020-11-14 DIAGNOSIS — D72819 Decreased white blood cell count, unspecified: Secondary | ICD-10-CM | POA: Diagnosis not present

## 2020-11-14 DIAGNOSIS — Z86718 Personal history of other venous thrombosis and embolism: Secondary | ICD-10-CM | POA: Diagnosis not present

## 2020-11-14 DIAGNOSIS — R29810 Facial weakness: Secondary | ICD-10-CM | POA: Diagnosis not present

## 2020-11-14 DIAGNOSIS — Z9889 Other specified postprocedural states: Secondary | ICD-10-CM | POA: Diagnosis not present

## 2020-11-28 DIAGNOSIS — C8589 Other specified types of non-Hodgkin lymphoma, extranodal and solid organ sites: Secondary | ICD-10-CM | POA: Diagnosis not present

## 2020-12-16 DIAGNOSIS — D496 Neoplasm of unspecified behavior of brain: Secondary | ICD-10-CM | POA: Diagnosis not present

## 2020-12-16 DIAGNOSIS — C8589 Other specified types of non-Hodgkin lymphoma, extranodal and solid organ sites: Secondary | ICD-10-CM | POA: Diagnosis not present

## 2020-12-16 DIAGNOSIS — G9389 Other specified disorders of brain: Secondary | ICD-10-CM | POA: Diagnosis not present

## 2020-12-18 DIAGNOSIS — H401111 Primary open-angle glaucoma, right eye, mild stage: Secondary | ICD-10-CM | POA: Diagnosis not present

## 2020-12-18 DIAGNOSIS — H43391 Other vitreous opacities, right eye: Secondary | ICD-10-CM | POA: Diagnosis not present

## 2020-12-18 DIAGNOSIS — H16221 Keratoconjunctivitis sicca, not specified as Sjogren's, right eye: Secondary | ICD-10-CM | POA: Diagnosis not present

## 2020-12-18 DIAGNOSIS — H35441 Age-related reticular degeneration of retina, right eye: Secondary | ICD-10-CM | POA: Diagnosis not present

## 2020-12-18 DIAGNOSIS — Z961 Presence of intraocular lens: Secondary | ICD-10-CM | POA: Diagnosis not present

## 2020-12-19 DIAGNOSIS — R439 Unspecified disturbances of smell and taste: Secondary | ICD-10-CM | POA: Diagnosis not present

## 2020-12-19 DIAGNOSIS — Z86718 Personal history of other venous thrombosis and embolism: Secondary | ICD-10-CM | POA: Diagnosis not present

## 2020-12-19 DIAGNOSIS — G510 Bell's palsy: Secondary | ICD-10-CM | POA: Diagnosis not present

## 2020-12-19 DIAGNOSIS — I25119 Atherosclerotic heart disease of native coronary artery with unspecified angina pectoris: Secondary | ICD-10-CM | POA: Diagnosis not present

## 2020-12-19 DIAGNOSIS — Z23 Encounter for immunization: Secondary | ICD-10-CM | POA: Diagnosis not present

## 2020-12-19 DIAGNOSIS — H9190 Unspecified hearing loss, unspecified ear: Secondary | ICD-10-CM | POA: Diagnosis not present

## 2020-12-19 DIAGNOSIS — H02402 Unspecified ptosis of left eyelid: Secondary | ICD-10-CM | POA: Diagnosis not present

## 2020-12-19 DIAGNOSIS — C8589 Other specified types of non-Hodgkin lymphoma, extranodal and solid organ sites: Secondary | ICD-10-CM | POA: Diagnosis not present

## 2020-12-19 DIAGNOSIS — R278 Other lack of coordination: Secondary | ICD-10-CM | POA: Diagnosis not present

## 2021-01-09 DIAGNOSIS — C8589 Other specified types of non-Hodgkin lymphoma, extranodal and solid organ sites: Secondary | ICD-10-CM | POA: Diagnosis not present

## 2021-01-13 DIAGNOSIS — Z1339 Encounter for screening examination for other mental health and behavioral disorders: Secondary | ICD-10-CM | POA: Diagnosis not present

## 2021-01-13 DIAGNOSIS — Z1331 Encounter for screening for depression: Secondary | ICD-10-CM | POA: Diagnosis not present

## 2021-01-13 DIAGNOSIS — Z0001 Encounter for general adult medical examination with abnormal findings: Secondary | ICD-10-CM | POA: Diagnosis not present

## 2021-01-13 DIAGNOSIS — N393 Stress incontinence (female) (male): Secondary | ICD-10-CM | POA: Diagnosis not present

## 2021-01-13 DIAGNOSIS — Z1231 Encounter for screening mammogram for malignant neoplasm of breast: Secondary | ICD-10-CM | POA: Diagnosis not present

## 2021-01-13 DIAGNOSIS — Z79899 Other long term (current) drug therapy: Secondary | ICD-10-CM | POA: Diagnosis not present

## 2021-01-13 DIAGNOSIS — E2839 Other primary ovarian failure: Secondary | ICD-10-CM | POA: Diagnosis not present

## 2021-01-13 DIAGNOSIS — E785 Hyperlipidemia, unspecified: Secondary | ICD-10-CM | POA: Diagnosis not present

## 2021-01-24 DIAGNOSIS — S92912A Unspecified fracture of left toe(s), initial encounter for closed fracture: Secondary | ICD-10-CM | POA: Diagnosis not present

## 2021-01-24 DIAGNOSIS — M79672 Pain in left foot: Secondary | ICD-10-CM | POA: Diagnosis not present

## 2021-01-24 DIAGNOSIS — S9030XA Contusion of unspecified foot, initial encounter: Secondary | ICD-10-CM | POA: Diagnosis not present

## 2021-02-06 DIAGNOSIS — E162 Hypoglycemia, unspecified: Secondary | ICD-10-CM | POA: Diagnosis not present

## 2021-02-06 DIAGNOSIS — R438 Other disturbances of smell and taste: Secondary | ICD-10-CM | POA: Diagnosis not present

## 2021-02-06 DIAGNOSIS — Z95828 Presence of other vascular implants and grafts: Secondary | ICD-10-CM | POA: Diagnosis not present

## 2021-02-06 DIAGNOSIS — Z7901 Long term (current) use of anticoagulants: Secondary | ICD-10-CM | POA: Diagnosis not present

## 2021-02-06 DIAGNOSIS — Z79620 Long term (current) use of immunosuppressive biologic: Secondary | ICD-10-CM | POA: Diagnosis not present

## 2021-02-06 DIAGNOSIS — H9190 Unspecified hearing loss, unspecified ear: Secondary | ICD-10-CM | POA: Diagnosis not present

## 2021-02-06 DIAGNOSIS — Z86718 Personal history of other venous thrombosis and embolism: Secondary | ICD-10-CM | POA: Diagnosis not present

## 2021-02-06 DIAGNOSIS — C8589 Other specified types of non-Hodgkin lymphoma, extranodal and solid organ sites: Secondary | ICD-10-CM | POA: Diagnosis not present

## 2021-02-06 DIAGNOSIS — C8519 Unspecified B-cell lymphoma, extranodal and solid organ sites: Secondary | ICD-10-CM | POA: Diagnosis not present

## 2021-02-06 DIAGNOSIS — Z79899 Other long term (current) drug therapy: Secondary | ICD-10-CM | POA: Diagnosis not present

## 2021-02-06 DIAGNOSIS — G510 Bell's palsy: Secondary | ICD-10-CM | POA: Diagnosis not present

## 2021-02-06 DIAGNOSIS — H02402 Unspecified ptosis of left eyelid: Secondary | ICD-10-CM | POA: Diagnosis not present

## 2021-02-27 DIAGNOSIS — C8589 Other specified types of non-Hodgkin lymphoma, extranodal and solid organ sites: Secondary | ICD-10-CM | POA: Diagnosis not present

## 2021-03-10 DIAGNOSIS — C8589 Other specified types of non-Hodgkin lymphoma, extranodal and solid organ sites: Secondary | ICD-10-CM | POA: Diagnosis not present

## 2021-03-18 DIAGNOSIS — G9389 Other specified disorders of brain: Secondary | ICD-10-CM | POA: Diagnosis not present

## 2021-03-18 DIAGNOSIS — C8589 Other specified types of non-Hodgkin lymphoma, extranodal and solid organ sites: Secondary | ICD-10-CM | POA: Diagnosis not present

## 2021-03-20 DIAGNOSIS — Z95828 Presence of other vascular implants and grafts: Secondary | ICD-10-CM | POA: Diagnosis not present

## 2021-03-20 DIAGNOSIS — Z86718 Personal history of other venous thrombosis and embolism: Secondary | ICD-10-CM | POA: Diagnosis not present

## 2021-03-20 DIAGNOSIS — R439 Unspecified disturbances of smell and taste: Secondary | ICD-10-CM | POA: Diagnosis not present

## 2021-03-20 DIAGNOSIS — C8589 Other specified types of non-Hodgkin lymphoma, extranodal and solid organ sites: Secondary | ICD-10-CM | POA: Diagnosis not present

## 2021-03-20 DIAGNOSIS — H9192 Unspecified hearing loss, left ear: Secondary | ICD-10-CM | POA: Diagnosis not present

## 2021-03-20 DIAGNOSIS — H02402 Unspecified ptosis of left eyelid: Secondary | ICD-10-CM | POA: Diagnosis not present

## 2021-03-20 DIAGNOSIS — G510 Bell's palsy: Secondary | ICD-10-CM | POA: Diagnosis not present

## 2021-03-20 DIAGNOSIS — R278 Other lack of coordination: Secondary | ICD-10-CM | POA: Diagnosis not present

## 2021-03-20 DIAGNOSIS — I2510 Atherosclerotic heart disease of native coronary artery without angina pectoris: Secondary | ICD-10-CM | POA: Diagnosis not present

## 2021-03-20 DIAGNOSIS — D61818 Other pancytopenia: Secondary | ICD-10-CM | POA: Diagnosis not present

## 2021-03-27 ENCOUNTER — Other Ambulatory Visit: Payer: Self-pay

## 2021-03-27 ENCOUNTER — Encounter: Payer: Self-pay | Admitting: Cardiology

## 2021-03-27 ENCOUNTER — Ambulatory Visit: Payer: Medicare PPO | Admitting: Cardiology

## 2021-03-27 VITALS — BP 134/76 | HR 59 | Ht <= 58 in | Wt 114.0 lb

## 2021-03-27 DIAGNOSIS — I25118 Atherosclerotic heart disease of native coronary artery with other forms of angina pectoris: Secondary | ICD-10-CM | POA: Diagnosis not present

## 2021-03-27 DIAGNOSIS — I480 Paroxysmal atrial fibrillation: Secondary | ICD-10-CM | POA: Diagnosis not present

## 2021-03-27 DIAGNOSIS — C8331 Diffuse large B-cell lymphoma, lymph nodes of head, face, and neck: Secondary | ICD-10-CM

## 2021-03-27 NOTE — Patient Instructions (Signed)

## 2021-03-27 NOTE — Progress Notes (Signed)
Cardiology Office Note:    Date:  03/27/2021   ID:  Kimberly Vaughn, DOB 01/13/1946, MRN [id number]  PCP:  Kimberly Kiel, MD  Cardiologist:  Kimberly Campus, MD    Referring MD: Kimberly Kiel, MD   Chief Complaint  Patient presents with   Medication Refill    All cardiac meds     History of Present Illness:    Kimberly Vaughn is a 76 y.o. female with past medical history significant for brain lymphoma that is being aggressively treated with chemotherapy by oncology team.  She has been treated already for 4 years doing quite well, paroxysmal atrial fibrillation, remote history of coronary artery disease.  She is in my office today for follow-up.  Overall cardiac wise doing well.  Denies have any palpitations.  There is no chest pain tightness squeezing pressure burning chest she still trying to be active she gets left-sided weakness and also some problem with facial movement but otherwise cardiac wise doing well.  Past Medical History:  Diagnosis Date   A-fib ([place])    Acute blood loss anemia 03/16/2017   Acute cystitis without hematuria 06/06/2017   Acute deep vein thrombosis (DVT) of left upper extremity ([place]) 11/19/2016   unspecified vein, associated with PICC line   Acute megaloblastic anemia due to severe illness 11/12/2016   Anemia associated with chemotherapy 11/26/2016   Angina pectoris ([place]) 03/09/2016   Anginal pain ([place])    Asthma    as a child   Asymmetric SNHL (sensorineural hearing loss) 04/15/2016   Ataxia 03/16/2017   Bacteremia due to methicillin susceptible Staphylococcus aureus (MSSA) 11/12/2016   Benign essential HTN    Bilateral impacted cerumen 01/05/2017   Bilateral lower extremity edema 11/12/2016   BPPV (benign paroxysmal positional vertigo) 12/25/2015   Brain mass 09/09/2016   Brain tumor ([place]) 09/04/2016   Corneal abrasion, left, sequela 12/03/2016   Coronary artery disease    Coronary artery disease involving native coronary artery of native heart  03/30/2016   Formatting of this note might be different from the original. PTCA and drug-eluting stents to right coronary artery in January 2018 Formatting of this note might be different from the original. Overview:  PTCA and drug-eluting stents to right coronary artery in January 2018 Formatting of this note might be different from the original. Overview:  Drug-eluting stents to right coronary artery January   Cranial nerve VI palsy, left 11/26/2016   Cranial nerve VII palsy 11/26/2016   Formatting of this note might be different from the original. Added automatically from request for surgery [id number]   DDD (degenerative disc disease), lumbar    Debility    Degenerative joint disease    Diarrhea 03/16/2017   Diffuse large B cell lymphoma ([place]) 09/16/2016   Dizziness and giddiness    DSAP (disseminated superficial actinic porokeratosis) 11/14/2016   Dyslipidemia (high LDL; low HDL) 03/30/2016   Dysphagia    Dysrhythmia    A fib   Encounter for antineoplastic chemotherapy 04/22/2017   Episodic atrial fibrillation (HCC)    Esophageal reflux    Essential hypertension 09/01/2016   Gastroesophageal reflux disease    GERD (gastroesophageal reflux disease)    Hiatal hernia    History of brain tumor    History of coronary artery stent placement 01/07/2017   History of kidney stones    History of MI (myocardial infarction)    Hyperlipidemia 11/12/2016   Hypertension, essential    Hypoalbuminemia due to protein-calorie malnutrition ([place])  Hypokalemia    Impaired mobility and ADLs 06/18/2020   Kidney stones    Klebsiella cystitis 11/12/2016   Labile blood pressure    Lagophthalmos of left upper eyelid 12/03/2016   Leucocytosis    Liver cyst LEFT   Mass of brain 09/04/2016   Myocardial infarction ([place])    Nodule of parotid gland    Non-Hodgkin lymphoma ([place])    Brain   Orthostatic hypotension 03/16/2017   PAF (paroxysmal atrial fibrillation) ([place])    Pancytopenia ([place]) 11/12/2016   Paralytic  lagophthalmos of left upper eyelid 03/16/2017   Formatting of this note might be different from the original. Added automatically from request for surgery [id number]   Parotid duct obstruction 04/15/2016   Paroxysmal atrial fibrillation ([place]) 09/01/2016   Chads2Vas score equals 3, but only 1 episodes in 2016, recent event recorder showing no evidence of atrial fibrillation.   Pneumonia    PONV (postoperative nausea and vomiting)    Post-operative pain    Premature atrial complex    Primary CNS lymphoma ([place]) 09/24/2016   Pulmonary nodule    PVC (premature ventricular contraction)    Nov 2017 & Feb 2018 noted on cardiac monitor worn at home   Pyelonephritis    Sepsis ([place]) 02/15/2018   Severe sepsis ([place]) 11/12/2016   Skipped heart beats 01/07/2017   Steroid-induced hyperglycemia    Subjective tinnitus of left ear 11/27/2015   Sudden hearing loss, left 02/28/2016   Trigger ring finger of right hand 07/07/2018   Unilateral deafness, left 04/25/2020   Unilateral facial paresis 07/01/2016   Vitamin D deficiency 11/26/2016    Past Surgical History:  Procedure Laterality Date   APPLICATION OF CRANIAL NAVIGATION N/A 09/04/2016   Procedure: APPLICATION OF CRANIAL NAVIGATION;  Surgeon: Consuella Lose, MD;  Location: [place] [facility];  Service: Neurosurgery;  Laterality: N/A;   BRAIN SURGERY  09/04/2016   BREAST BIOPSY Bilateral    CARPAL TUNNEL RELEASE Right 09/2013   CATARACTS Bilateral    CERVICAL SPINE SURGERY  04/2012   FINGER SURGERY Right    TRIGGER FINGER RELEASE   PARTIAL THYMECTOMY  1973   pt states doesn't remember this being done   RETROSIGMOID CRANIECTOMY FOR TUMOR RESECTION Left 09/04/2016   Procedure: RETROSIGMOID CRANIECTOMY FOR TUMOR RESECTION WITH BRAINLAB NAVIGATION;  Surgeon: Consuella Lose, MD;  Location: [facility];  Service: Neurosurgery;  Laterality: Left;  CRANIOTOMY TUMOR EXCISION   TONSILLECTOMY  1964   VAGINAL HYSTERECTOMY  1974    Current Medications: Current Meds  Medication Sig    atorvastatin (LIPITOR) 80 MG tablet Take 1 tablet (80 mg total) by mouth daily. (Patient taking differently: Take 40 mg by mouth daily.)   Carboxymethylcellulose Sod PF 0.5 % SOLN Apply 1 drop to eye 4 (four) times daily. One drop in left eye 4 times daily.   Cholecalciferol (VITAMIN D3) 2000 units TABS Take 2,000 Units by mouth daily.   Cyanocobalamin (VITAMIN B-12) 5000 MCG SUBL Take 5,000 mcg by mouth daily.    erythromycin ophthalmic ointment Place 1 application into the left eye 4 (four) times daily. Unknown strength   esomeprazole (NEXIUM) 40 MG capsule Take 40 mg by mouth daily.   lenalidomide (REVLIMID) 10 MG capsule Take 10 mg by mouth daily.   magnesium chloride (SLOW-MAG) 64 MG TBEC SR tablet Take 1 tablet by mouth 2 (two) times daily.    metoprolol tartrate (LOPRESSOR) 25 MG tablet TAKE 1 TABLET(25 MG) BY MOUTH TWICE DAILY (Patient taking differently: Take 25 mg by mouth  2 (two) times daily.)   nitrofurantoin, macrocrystal-monohydrate, (MACROBID) 100 MG capsule Take 100 mg by mouth daily.   nitroGLYCERIN (NITROSTAT) 0.4 MG SL tablet Place 1 tablet (0.4 mg total) under the tongue every 5 (five) minutes as needed for chest pain.   Potassium Chloride ER 20 MEQ TBCR Take 1 tablet by mouth 2 (two) times daily. 2  In the morning and 1 at night   rivaroxaban (XARELTO) 10 MG TABS tablet Take 10 mg by mouth daily with supper.    sennosides-docusate sodium (SENOKOT-S) 8.6-50 MG tablet Take 1 tablet by mouth 2 (two) times daily.   tamsulosin (FLOMAX) 0.4 MG CAPS capsule Take 0.4 mg by mouth daily.     Allergies:   Patient has no known allergies.   Social History   Socioeconomic History   Marital status: Married    Spouse name: Not on file   Number of children: 1   Years of education: 12   Highest education level: Not on file  Occupational History   Occupation: Retired school  Tobacco Use   Smoking status: Former    Types: Cigarettes    Quit date: 10/23/2005    Years since  quitting: 15.4   Smokeless tobacco: Never  Vaping Use   Vaping Use: Never used  Substance and Sexual Activity   Alcohol use: No   Drug use: No   Sexual activity: Not Currently    Comment: MARRIED  Other Topics Concern   Not on file  Social History Narrative   Lives with husband   Caffeine use: 2 cups coffee per day   Admitted to [facility]   Former smoker - stopped 2007   Alcohol none   DNR   Social Determinants of Health   Financial Resource Strain: Not on file  Food Insecurity: Not on file  Transportation Needs: Not on file  Physical Activity: Not on file  Stress: Not on file  Social Connections: Not on file     Family History: The patient's family history includes Heart attack (age of onset: 80) in her father; Heart disease in her father; Heart disease (age of onset: 74) in her mother; Hypertension in her brother, daughter, mother, sister, and another family member; Lymphoma in her sister and another family member; Melanoma in her brother and another family member; Osteoarthritis in an other family member; Psoriasis in her mother; Rheum arthritis in her brother and mother. ROS:   Please see the history of present illness.    All 14 point review of systems negative except as described per history of present illness  EKGs/Labs/Other Studies Reviewed:      Recent Labs: No results found for requested labs within last 8760 hours.  Recent Lipid Panel No results found for: CHOL, TRIG, HDL, CHOLHDL, VLDL, LDLCALC, LDLDIRECT  Physical Exam:    VS:  BP 134/76 (BP Location: Left Arm, Patient Position: Sitting)    Pulse (!) 59    Ht 4\' 10"  (1.473 m)    Wt 114 lb (51.7 kg)    LMP  (LMP Unknown)    SpO2 97%    BMI 23.83 kg/m     Wt Readings from Last 3 Encounters:  03/27/21 114 lb (51.7 kg)  06/21/20 109 lb (49.4 kg)  09/13/19 109 lb (49.4 kg)     GEN:  Well nourished, well developed in no acute distress HEENT: Normal NECK: No JVD; No carotid  bruits LYMPHATICS: No lymphadenopathy CARDIAC: RRR, no murmurs, no rubs, no gallops  RESPIRATORY:  Clear to auscultation without rales, wheezing or rhonchi  ABDOMEN: Soft, non-tender, non-distended MUSCULOSKELETAL:  No edema; No deformity  SKIN: Warm and dry LOWER EXTREMITIES: no swelling NEUROLOGIC:  Alert and oriented x 3 PSYCHIATRIC:  Normal affect   ASSESSMENT:    1. Coronary artery disease of native artery of native heart with stable angina pectoris (HCC)   2. Paroxysmal atrial fibrillation ([place])   3. Diffuse large B-cell lymphoma of lymph nodes of head (HCC)    PLAN:    In order of problems listed above:  Coronary artery disease stable from that point review no recent issues.  Continue conservative approach which include statin therapy. Paroxysmal atrial fibrillation she is on low-dose of Xarelto which I will not increase secondary to lymphoma of her brain.  Luckily she never had any bleeding Dyslipidemia.  I did review her K PN which show me her LDL of 41 HDL 67 good cholesterol profile continue present management   Medication Adjustments/Labs and Tests Ordered: Current medicines are reviewed at length with the patient today.  Concerns regarding medicines are outlined above.  No orders of the defined types were placed in this encounter.  Medication changes: No orders of the defined types were placed in this encounter.   Signed, Park Liter, MD, [facility] 03/27/2021 3:14 PM    [place]

## 2021-04-07 DIAGNOSIS — C8589 Other specified types of non-Hodgkin lymphoma, extranodal and solid organ sites: Secondary | ICD-10-CM | POA: Diagnosis not present

## 2021-04-26 DIAGNOSIS — R000 Tachycardia, unspecified: Secondary | ICD-10-CM | POA: Diagnosis not present

## 2021-04-26 DIAGNOSIS — C719 Malignant neoplasm of brain, unspecified: Secondary | ICD-10-CM | POA: Diagnosis not present

## 2021-04-26 DIAGNOSIS — R0602 Shortness of breath: Secondary | ICD-10-CM | POA: Diagnosis not present

## 2021-04-26 DIAGNOSIS — I10 Essential (primary) hypertension: Secondary | ICD-10-CM | POA: Diagnosis not present

## 2021-04-26 DIAGNOSIS — U071 COVID-19: Secondary | ICD-10-CM | POA: Diagnosis not present

## 2021-04-26 DIAGNOSIS — A4189 Other specified sepsis: Secondary | ICD-10-CM | POA: Diagnosis not present

## 2021-04-26 DIAGNOSIS — J9601 Acute respiratory failure with hypoxia: Secondary | ICD-10-CM | POA: Diagnosis not present

## 2021-04-26 DIAGNOSIS — R531 Weakness: Secondary | ICD-10-CM | POA: Diagnosis not present

## 2021-04-26 DIAGNOSIS — E872 Acidosis, unspecified: Secondary | ICD-10-CM | POA: Diagnosis not present

## 2021-04-26 DIAGNOSIS — R0689 Other abnormalities of breathing: Secondary | ICD-10-CM | POA: Diagnosis not present

## 2021-04-26 DIAGNOSIS — J189 Pneumonia, unspecified organism: Secondary | ICD-10-CM | POA: Diagnosis not present

## 2021-04-26 DIAGNOSIS — A0839 Other viral enteritis: Secondary | ICD-10-CM | POA: Diagnosis not present

## 2021-04-26 DIAGNOSIS — G8194 Hemiplegia, unspecified affecting left nondominant side: Secondary | ICD-10-CM | POA: Diagnosis not present

## 2021-04-26 DIAGNOSIS — J1282 Pneumonia due to coronavirus disease 2019: Secondary | ICD-10-CM | POA: Diagnosis not present

## 2021-04-26 DIAGNOSIS — A419 Sepsis, unspecified organism: Secondary | ICD-10-CM | POA: Diagnosis not present

## 2021-04-26 DIAGNOSIS — Z7401 Bed confinement status: Secondary | ICD-10-CM | POA: Diagnosis not present

## 2021-04-26 DIAGNOSIS — R6520 Severe sepsis without septic shock: Secondary | ICD-10-CM | POA: Diagnosis not present

## 2021-05-03 DIAGNOSIS — J1282 Pneumonia due to coronavirus disease 2019: Secondary | ICD-10-CM | POA: Diagnosis not present

## 2021-05-03 DIAGNOSIS — D649 Anemia, unspecified: Secondary | ICD-10-CM | POA: Diagnosis not present

## 2021-05-03 DIAGNOSIS — R0602 Shortness of breath: Secondary | ICD-10-CM | POA: Diagnosis not present

## 2021-05-03 DIAGNOSIS — J188 Other pneumonia, unspecified organism: Secondary | ICD-10-CM | POA: Diagnosis not present

## 2021-05-03 DIAGNOSIS — Z7401 Bed confinement status: Secondary | ICD-10-CM | POA: Diagnosis not present

## 2021-05-03 DIAGNOSIS — J9601 Acute respiratory failure with hypoxia: Secondary | ICD-10-CM | POA: Diagnosis not present

## 2021-05-03 DIAGNOSIS — A4189 Other specified sepsis: Secondary | ICD-10-CM | POA: Diagnosis not present

## 2021-05-03 DIAGNOSIS — E441 Mild protein-calorie malnutrition: Secondary | ICD-10-CM | POA: Diagnosis not present

## 2021-05-03 DIAGNOSIS — I5020 Unspecified systolic (congestive) heart failure: Secondary | ICD-10-CM | POA: Diagnosis not present

## 2021-05-03 DIAGNOSIS — R2681 Unsteadiness on feet: Secondary | ICD-10-CM | POA: Diagnosis not present

## 2021-05-03 DIAGNOSIS — D696 Thrombocytopenia, unspecified: Secondary | ICD-10-CM | POA: Diagnosis not present

## 2021-05-03 DIAGNOSIS — R531 Weakness: Secondary | ICD-10-CM | POA: Diagnosis not present

## 2021-05-03 DIAGNOSIS — U071 COVID-19: Secondary | ICD-10-CM | POA: Diagnosis not present

## 2021-05-03 DIAGNOSIS — A419 Sepsis, unspecified organism: Secondary | ICD-10-CM | POA: Diagnosis not present

## 2021-05-03 DIAGNOSIS — Z79899 Other long term (current) drug therapy: Secondary | ICD-10-CM | POA: Diagnosis not present

## 2021-05-03 DIAGNOSIS — J189 Pneumonia, unspecified organism: Secondary | ICD-10-CM | POA: Diagnosis not present

## 2021-05-04 DIAGNOSIS — D649 Anemia, unspecified: Secondary | ICD-10-CM | POA: Diagnosis not present

## 2021-05-04 DIAGNOSIS — E441 Mild protein-calorie malnutrition: Secondary | ICD-10-CM | POA: Diagnosis not present

## 2021-05-04 DIAGNOSIS — J188 Other pneumonia, unspecified organism: Secondary | ICD-10-CM | POA: Diagnosis not present

## 2021-05-04 DIAGNOSIS — R531 Weakness: Secondary | ICD-10-CM | POA: Diagnosis not present

## 2021-05-04 DIAGNOSIS — R2681 Unsteadiness on feet: Secondary | ICD-10-CM | POA: Diagnosis not present

## 2021-05-04 DIAGNOSIS — D696 Thrombocytopenia, unspecified: Secondary | ICD-10-CM | POA: Diagnosis not present

## 2021-05-19 DIAGNOSIS — R2681 Unsteadiness on feet: Secondary | ICD-10-CM | POA: Diagnosis not present

## 2021-05-19 DIAGNOSIS — U071 COVID-19: Secondary | ICD-10-CM | POA: Diagnosis not present

## 2021-05-19 DIAGNOSIS — M6281 Muscle weakness (generalized): Secondary | ICD-10-CM | POA: Diagnosis not present

## 2021-05-19 DIAGNOSIS — R278 Other lack of coordination: Secondary | ICD-10-CM | POA: Diagnosis not present

## 2021-05-19 DIAGNOSIS — Z85841 Personal history of malignant neoplasm of brain: Secondary | ICD-10-CM | POA: Diagnosis not present

## 2021-05-19 DIAGNOSIS — J189 Pneumonia, unspecified organism: Secondary | ICD-10-CM | POA: Diagnosis not present

## 2021-05-20 DIAGNOSIS — U071 COVID-19: Secondary | ICD-10-CM | POA: Diagnosis not present

## 2021-05-20 DIAGNOSIS — R2681 Unsteadiness on feet: Secondary | ICD-10-CM | POA: Diagnosis not present

## 2021-05-20 DIAGNOSIS — Z85841 Personal history of malignant neoplasm of brain: Secondary | ICD-10-CM | POA: Diagnosis not present

## 2021-05-20 DIAGNOSIS — J189 Pneumonia, unspecified organism: Secondary | ICD-10-CM | POA: Diagnosis not present

## 2021-05-20 DIAGNOSIS — M6281 Muscle weakness (generalized): Secondary | ICD-10-CM | POA: Diagnosis not present

## 2021-05-20 DIAGNOSIS — R278 Other lack of coordination: Secondary | ICD-10-CM | POA: Diagnosis not present

## 2021-05-21 DIAGNOSIS — U071 COVID-19: Secondary | ICD-10-CM | POA: Diagnosis not present

## 2021-05-21 DIAGNOSIS — R2681 Unsteadiness on feet: Secondary | ICD-10-CM | POA: Diagnosis not present

## 2021-05-21 DIAGNOSIS — J189 Pneumonia, unspecified organism: Secondary | ICD-10-CM | POA: Diagnosis not present

## 2021-05-21 DIAGNOSIS — R278 Other lack of coordination: Secondary | ICD-10-CM | POA: Diagnosis not present

## 2021-05-21 DIAGNOSIS — M6281 Muscle weakness (generalized): Secondary | ICD-10-CM | POA: Diagnosis not present

## 2021-05-21 DIAGNOSIS — Z85841 Personal history of malignant neoplasm of brain: Secondary | ICD-10-CM | POA: Diagnosis not present

## 2021-05-22 DIAGNOSIS — M6281 Muscle weakness (generalized): Secondary | ICD-10-CM | POA: Diagnosis not present

## 2021-05-22 DIAGNOSIS — Z85841 Personal history of malignant neoplasm of brain: Secondary | ICD-10-CM | POA: Diagnosis not present

## 2021-05-22 DIAGNOSIS — U071 COVID-19: Secondary | ICD-10-CM | POA: Diagnosis not present

## 2021-05-22 DIAGNOSIS — R278 Other lack of coordination: Secondary | ICD-10-CM | POA: Diagnosis not present

## 2021-05-22 DIAGNOSIS — R2681 Unsteadiness on feet: Secondary | ICD-10-CM | POA: Diagnosis not present

## 2021-05-22 DIAGNOSIS — J189 Pneumonia, unspecified organism: Secondary | ICD-10-CM | POA: Diagnosis not present

## 2021-05-23 DIAGNOSIS — M6281 Muscle weakness (generalized): Secondary | ICD-10-CM | POA: Diagnosis not present

## 2021-05-23 DIAGNOSIS — Z85841 Personal history of malignant neoplasm of brain: Secondary | ICD-10-CM | POA: Diagnosis not present

## 2021-05-23 DIAGNOSIS — U071 COVID-19: Secondary | ICD-10-CM | POA: Diagnosis not present

## 2021-05-23 DIAGNOSIS — J189 Pneumonia, unspecified organism: Secondary | ICD-10-CM | POA: Diagnosis not present

## 2021-05-23 DIAGNOSIS — R278 Other lack of coordination: Secondary | ICD-10-CM | POA: Diagnosis not present

## 2021-05-23 DIAGNOSIS — R2681 Unsteadiness on feet: Secondary | ICD-10-CM | POA: Diagnosis not present

## 2021-05-26 DIAGNOSIS — J189 Pneumonia, unspecified organism: Secondary | ICD-10-CM | POA: Diagnosis not present

## 2021-05-26 DIAGNOSIS — U071 COVID-19: Secondary | ICD-10-CM | POA: Diagnosis not present

## 2021-05-26 DIAGNOSIS — Z85841 Personal history of malignant neoplasm of brain: Secondary | ICD-10-CM | POA: Diagnosis not present

## 2021-05-26 DIAGNOSIS — M6281 Muscle weakness (generalized): Secondary | ICD-10-CM | POA: Diagnosis not present

## 2021-05-26 DIAGNOSIS — R2681 Unsteadiness on feet: Secondary | ICD-10-CM | POA: Diagnosis not present

## 2021-05-26 DIAGNOSIS — R278 Other lack of coordination: Secondary | ICD-10-CM | POA: Diagnosis not present

## 2021-05-27 DIAGNOSIS — R2681 Unsteadiness on feet: Secondary | ICD-10-CM | POA: Diagnosis not present

## 2021-05-27 DIAGNOSIS — U071 COVID-19: Secondary | ICD-10-CM | POA: Diagnosis not present

## 2021-05-27 DIAGNOSIS — J189 Pneumonia, unspecified organism: Secondary | ICD-10-CM | POA: Diagnosis not present

## 2021-05-27 DIAGNOSIS — M6281 Muscle weakness (generalized): Secondary | ICD-10-CM | POA: Diagnosis not present

## 2021-05-27 DIAGNOSIS — R278 Other lack of coordination: Secondary | ICD-10-CM | POA: Diagnosis not present

## 2021-05-27 DIAGNOSIS — Z85841 Personal history of malignant neoplasm of brain: Secondary | ICD-10-CM | POA: Diagnosis not present

## 2021-05-28 DIAGNOSIS — Z85841 Personal history of malignant neoplasm of brain: Secondary | ICD-10-CM | POA: Diagnosis not present

## 2021-05-28 DIAGNOSIS — M6281 Muscle weakness (generalized): Secondary | ICD-10-CM | POA: Diagnosis not present

## 2021-05-28 DIAGNOSIS — R278 Other lack of coordination: Secondary | ICD-10-CM | POA: Diagnosis not present

## 2021-05-28 DIAGNOSIS — R2681 Unsteadiness on feet: Secondary | ICD-10-CM | POA: Diagnosis not present

## 2021-05-28 DIAGNOSIS — J189 Pneumonia, unspecified organism: Secondary | ICD-10-CM | POA: Diagnosis not present

## 2021-05-28 DIAGNOSIS — U071 COVID-19: Secondary | ICD-10-CM | POA: Diagnosis not present

## 2021-05-29 DIAGNOSIS — Z79899 Other long term (current) drug therapy: Secondary | ICD-10-CM | POA: Diagnosis not present

## 2021-05-29 DIAGNOSIS — R2681 Unsteadiness on feet: Secondary | ICD-10-CM | POA: Diagnosis not present

## 2021-05-29 DIAGNOSIS — U071 COVID-19: Secondary | ICD-10-CM | POA: Diagnosis not present

## 2021-05-29 DIAGNOSIS — Z85841 Personal history of malignant neoplasm of brain: Secondary | ICD-10-CM | POA: Diagnosis not present

## 2021-05-29 DIAGNOSIS — C8589 Other specified types of non-Hodgkin lymphoma, extranodal and solid organ sites: Secondary | ICD-10-CM | POA: Diagnosis not present

## 2021-05-29 DIAGNOSIS — Z95828 Presence of other vascular implants and grafts: Secondary | ICD-10-CM | POA: Diagnosis not present

## 2021-05-29 DIAGNOSIS — M6281 Muscle weakness (generalized): Secondary | ICD-10-CM | POA: Diagnosis not present

## 2021-05-29 DIAGNOSIS — D649 Anemia, unspecified: Secondary | ICD-10-CM | POA: Diagnosis not present

## 2021-05-29 DIAGNOSIS — J189 Pneumonia, unspecified organism: Secondary | ICD-10-CM | POA: Diagnosis not present

## 2021-05-29 DIAGNOSIS — I2510 Atherosclerotic heart disease of native coronary artery without angina pectoris: Secondary | ICD-10-CM | POA: Diagnosis not present

## 2021-05-29 DIAGNOSIS — Z86718 Personal history of other venous thrombosis and embolism: Secondary | ICD-10-CM | POA: Diagnosis not present

## 2021-05-29 DIAGNOSIS — G510 Bell's palsy: Secondary | ICD-10-CM | POA: Diagnosis not present

## 2021-05-29 DIAGNOSIS — H918X2 Other specified hearing loss, left ear: Secondary | ICD-10-CM | POA: Diagnosis not present

## 2021-05-29 DIAGNOSIS — Z7901 Long term (current) use of anticoagulants: Secondary | ICD-10-CM | POA: Diagnosis not present

## 2021-05-29 DIAGNOSIS — H9190 Unspecified hearing loss, unspecified ear: Secondary | ICD-10-CM | POA: Diagnosis not present

## 2021-05-29 DIAGNOSIS — R278 Other lack of coordination: Secondary | ICD-10-CM | POA: Diagnosis not present

## 2021-05-29 DIAGNOSIS — R630 Anorexia: Secondary | ICD-10-CM | POA: Diagnosis not present

## 2021-05-29 DIAGNOSIS — D630 Anemia in neoplastic disease: Secondary | ICD-10-CM | POA: Diagnosis not present

## 2021-05-30 DIAGNOSIS — D630 Anemia in neoplastic disease: Secondary | ICD-10-CM | POA: Diagnosis not present

## 2021-05-30 DIAGNOSIS — C8589 Other specified types of non-Hodgkin lymphoma, extranodal and solid organ sites: Secondary | ICD-10-CM | POA: Diagnosis not present

## 2021-06-02 DIAGNOSIS — U071 COVID-19: Secondary | ICD-10-CM | POA: Diagnosis not present

## 2021-06-02 DIAGNOSIS — M6281 Muscle weakness (generalized): Secondary | ICD-10-CM | POA: Diagnosis not present

## 2021-06-02 DIAGNOSIS — R2681 Unsteadiness on feet: Secondary | ICD-10-CM | POA: Diagnosis not present

## 2021-06-02 DIAGNOSIS — Z85841 Personal history of malignant neoplasm of brain: Secondary | ICD-10-CM | POA: Diagnosis not present

## 2021-06-02 DIAGNOSIS — R278 Other lack of coordination: Secondary | ICD-10-CM | POA: Diagnosis not present

## 2021-06-02 DIAGNOSIS — J189 Pneumonia, unspecified organism: Secondary | ICD-10-CM | POA: Diagnosis not present

## 2021-06-03 DIAGNOSIS — R2681 Unsteadiness on feet: Secondary | ICD-10-CM | POA: Diagnosis not present

## 2021-06-03 DIAGNOSIS — Z85841 Personal history of malignant neoplasm of brain: Secondary | ICD-10-CM | POA: Diagnosis not present

## 2021-06-03 DIAGNOSIS — R278 Other lack of coordination: Secondary | ICD-10-CM | POA: Diagnosis not present

## 2021-06-03 DIAGNOSIS — M6281 Muscle weakness (generalized): Secondary | ICD-10-CM | POA: Diagnosis not present

## 2021-06-03 DIAGNOSIS — J189 Pneumonia, unspecified organism: Secondary | ICD-10-CM | POA: Diagnosis not present

## 2021-06-03 DIAGNOSIS — U071 COVID-19: Secondary | ICD-10-CM | POA: Diagnosis not present

## 2021-06-04 DIAGNOSIS — Z85841 Personal history of malignant neoplasm of brain: Secondary | ICD-10-CM | POA: Diagnosis not present

## 2021-06-04 DIAGNOSIS — U071 COVID-19: Secondary | ICD-10-CM | POA: Diagnosis not present

## 2021-06-04 DIAGNOSIS — J189 Pneumonia, unspecified organism: Secondary | ICD-10-CM | POA: Diagnosis not present

## 2021-06-04 DIAGNOSIS — M6281 Muscle weakness (generalized): Secondary | ICD-10-CM | POA: Diagnosis not present

## 2021-06-04 DIAGNOSIS — R278 Other lack of coordination: Secondary | ICD-10-CM | POA: Diagnosis not present

## 2021-06-04 DIAGNOSIS — R2681 Unsteadiness on feet: Secondary | ICD-10-CM | POA: Diagnosis not present

## 2021-06-05 DIAGNOSIS — R2681 Unsteadiness on feet: Secondary | ICD-10-CM | POA: Diagnosis not present

## 2021-06-05 DIAGNOSIS — Z85841 Personal history of malignant neoplasm of brain: Secondary | ICD-10-CM | POA: Diagnosis not present

## 2021-06-05 DIAGNOSIS — J189 Pneumonia, unspecified organism: Secondary | ICD-10-CM | POA: Diagnosis not present

## 2021-06-05 DIAGNOSIS — U071 COVID-19: Secondary | ICD-10-CM | POA: Diagnosis not present

## 2021-06-05 DIAGNOSIS — M6281 Muscle weakness (generalized): Secondary | ICD-10-CM | POA: Diagnosis not present

## 2021-06-05 DIAGNOSIS — R278 Other lack of coordination: Secondary | ICD-10-CM | POA: Diagnosis not present

## 2021-06-06 DIAGNOSIS — J189 Pneumonia, unspecified organism: Secondary | ICD-10-CM | POA: Diagnosis not present

## 2021-06-06 DIAGNOSIS — R278 Other lack of coordination: Secondary | ICD-10-CM | POA: Diagnosis not present

## 2021-06-06 DIAGNOSIS — M6281 Muscle weakness (generalized): Secondary | ICD-10-CM | POA: Diagnosis not present

## 2021-06-06 DIAGNOSIS — U071 COVID-19: Secondary | ICD-10-CM | POA: Diagnosis not present

## 2021-06-06 DIAGNOSIS — R2681 Unsteadiness on feet: Secondary | ICD-10-CM | POA: Diagnosis not present

## 2021-06-06 DIAGNOSIS — Z85841 Personal history of malignant neoplasm of brain: Secondary | ICD-10-CM | POA: Diagnosis not present

## 2021-06-12 DIAGNOSIS — N390 Urinary tract infection, site not specified: Secondary | ICD-10-CM | POA: Diagnosis not present

## 2021-06-12 DIAGNOSIS — B964 Proteus (mirabilis) (morganii) as the cause of diseases classified elsewhere: Secondary | ICD-10-CM | POA: Diagnosis not present

## 2021-06-12 DIAGNOSIS — Z85841 Personal history of malignant neoplasm of brain: Secondary | ICD-10-CM | POA: Diagnosis not present

## 2021-06-12 DIAGNOSIS — A419 Sepsis, unspecified organism: Secondary | ICD-10-CM | POA: Diagnosis not present

## 2021-06-12 DIAGNOSIS — D649 Anemia, unspecified: Secondary | ICD-10-CM | POA: Diagnosis not present

## 2021-06-12 DIAGNOSIS — Z7901 Long term (current) use of anticoagulants: Secondary | ICD-10-CM | POA: Diagnosis not present

## 2021-06-12 DIAGNOSIS — D701 Agranulocytosis secondary to cancer chemotherapy: Secondary | ICD-10-CM | POA: Diagnosis not present

## 2021-06-12 DIAGNOSIS — C8589 Other specified types of non-Hodgkin lymphoma, extranodal and solid organ sites: Secondary | ICD-10-CM | POA: Diagnosis not present

## 2021-06-12 DIAGNOSIS — J189 Pneumonia, unspecified organism: Secondary | ICD-10-CM | POA: Diagnosis not present

## 2021-06-18 DIAGNOSIS — E441 Mild protein-calorie malnutrition: Secondary | ICD-10-CM | POA: Diagnosis not present

## 2021-06-18 DIAGNOSIS — B370 Candidal stomatitis: Secondary | ICD-10-CM | POA: Diagnosis not present

## 2021-06-18 DIAGNOSIS — Z681 Body mass index (BMI) 19 or less, adult: Secondary | ICD-10-CM | POA: Diagnosis not present

## 2021-06-18 DIAGNOSIS — C8589 Other specified types of non-Hodgkin lymphoma, extranodal and solid organ sites: Secondary | ICD-10-CM | POA: Diagnosis not present

## 2021-06-18 DIAGNOSIS — Z09 Encounter for follow-up examination after completed treatment for conditions other than malignant neoplasm: Secondary | ICD-10-CM | POA: Diagnosis not present

## 2021-06-23 DIAGNOSIS — Z7901 Long term (current) use of anticoagulants: Secondary | ICD-10-CM | POA: Diagnosis not present

## 2021-06-23 DIAGNOSIS — C8589 Other specified types of non-Hodgkin lymphoma, extranodal and solid organ sites: Secondary | ICD-10-CM | POA: Diagnosis not present

## 2021-06-23 DIAGNOSIS — A419 Sepsis, unspecified organism: Secondary | ICD-10-CM | POA: Diagnosis not present

## 2021-06-23 DIAGNOSIS — J189 Pneumonia, unspecified organism: Secondary | ICD-10-CM | POA: Diagnosis not present

## 2021-06-23 DIAGNOSIS — Z85841 Personal history of malignant neoplasm of brain: Secondary | ICD-10-CM | POA: Diagnosis not present

## 2021-06-23 DIAGNOSIS — D701 Agranulocytosis secondary to cancer chemotherapy: Secondary | ICD-10-CM | POA: Diagnosis not present

## 2021-06-23 DIAGNOSIS — N390 Urinary tract infection, site not specified: Secondary | ICD-10-CM | POA: Diagnosis not present

## 2021-06-23 DIAGNOSIS — D649 Anemia, unspecified: Secondary | ICD-10-CM | POA: Diagnosis not present

## 2021-06-23 DIAGNOSIS — B964 Proteus (mirabilis) (morganii) as the cause of diseases classified elsewhere: Secondary | ICD-10-CM | POA: Diagnosis not present

## 2021-07-22 DIAGNOSIS — G9389 Other specified disorders of brain: Secondary | ICD-10-CM | POA: Diagnosis not present

## 2021-07-22 DIAGNOSIS — C8580 Other specified types of non-Hodgkin lymphoma, unspecified site: Secondary | ICD-10-CM | POA: Diagnosis not present

## 2021-07-24 DIAGNOSIS — R278 Other lack of coordination: Secondary | ICD-10-CM | POA: Diagnosis not present

## 2021-07-24 DIAGNOSIS — H538 Other visual disturbances: Secondary | ICD-10-CM | POA: Diagnosis not present

## 2021-07-24 DIAGNOSIS — G510 Bell's palsy: Secondary | ICD-10-CM | POA: Diagnosis not present

## 2021-07-24 DIAGNOSIS — R202 Paresthesia of skin: Secondary | ICD-10-CM | POA: Diagnosis not present

## 2021-07-24 DIAGNOSIS — R200 Anesthesia of skin: Secondary | ICD-10-CM | POA: Diagnosis not present

## 2021-07-24 DIAGNOSIS — C8589 Other specified types of non-Hodgkin lymphoma, extranodal and solid organ sites: Secondary | ICD-10-CM | POA: Diagnosis not present

## 2021-07-24 DIAGNOSIS — H918X2 Other specified hearing loss, left ear: Secondary | ICD-10-CM | POA: Diagnosis not present

## 2021-07-24 DIAGNOSIS — H02402 Unspecified ptosis of left eyelid: Secondary | ICD-10-CM | POA: Diagnosis not present

## 2021-07-24 DIAGNOSIS — D649 Anemia, unspecified: Secondary | ICD-10-CM | POA: Diagnosis not present

## 2021-07-24 DIAGNOSIS — R42 Dizziness and giddiness: Secondary | ICD-10-CM | POA: Diagnosis not present

## 2021-07-24 DIAGNOSIS — H9192 Unspecified hearing loss, left ear: Secondary | ICD-10-CM | POA: Diagnosis not present

## 2021-07-24 DIAGNOSIS — Z86718 Personal history of other venous thrombosis and embolism: Secondary | ICD-10-CM | POA: Diagnosis not present

## 2021-07-24 DIAGNOSIS — Z7901 Long term (current) use of anticoagulants: Secondary | ICD-10-CM | POA: Diagnosis not present

## 2021-07-31 DIAGNOSIS — D0462 Carcinoma in situ of skin of left upper limb, including shoulder: Secondary | ICD-10-CM | POA: Diagnosis not present

## 2021-07-31 DIAGNOSIS — L565 Disseminated superficial actinic porokeratosis (DSAP): Secondary | ICD-10-CM | POA: Diagnosis not present

## 2021-07-31 DIAGNOSIS — L570 Actinic keratosis: Secondary | ICD-10-CM | POA: Diagnosis not present

## 2021-07-31 DIAGNOSIS — D485 Neoplasm of uncertain behavior of skin: Secondary | ICD-10-CM | POA: Diagnosis not present

## 2021-09-23 DIAGNOSIS — Z483 Aftercare following surgery for neoplasm: Secondary | ICD-10-CM | POA: Diagnosis not present

## 2021-09-23 DIAGNOSIS — G9389 Other specified disorders of brain: Secondary | ICD-10-CM | POA: Diagnosis not present

## 2021-09-23 DIAGNOSIS — C8589 Other specified types of non-Hodgkin lymphoma, extranodal and solid organ sites: Secondary | ICD-10-CM | POA: Diagnosis not present

## 2021-09-24 ENCOUNTER — Ambulatory Visit: Payer: Medicare PPO | Admitting: Cardiology

## 2021-09-24 DIAGNOSIS — E785 Hyperlipidemia, unspecified: Secondary | ICD-10-CM | POA: Diagnosis not present

## 2021-09-24 DIAGNOSIS — Z6822 Body mass index (BMI) 22.0-22.9, adult: Secondary | ICD-10-CM | POA: Diagnosis not present

## 2021-09-24 DIAGNOSIS — I2510 Atherosclerotic heart disease of native coronary artery without angina pectoris: Secondary | ICD-10-CM | POA: Diagnosis not present

## 2021-09-24 DIAGNOSIS — Z79899 Other long term (current) drug therapy: Secondary | ICD-10-CM | POA: Diagnosis not present

## 2021-09-24 DIAGNOSIS — K219 Gastro-esophageal reflux disease without esophagitis: Secondary | ICD-10-CM | POA: Diagnosis not present

## 2021-09-24 DIAGNOSIS — C8589 Other specified types of non-Hodgkin lymphoma, extranodal and solid organ sites: Secondary | ICD-10-CM | POA: Diagnosis not present

## 2021-09-24 DIAGNOSIS — N390 Urinary tract infection, site not specified: Secondary | ICD-10-CM | POA: Diagnosis not present

## 2021-10-01 ENCOUNTER — Ambulatory Visit: Payer: Medicare PPO | Admitting: Cardiology

## 2021-10-01 ENCOUNTER — Encounter: Payer: Self-pay | Admitting: Cardiology

## 2021-10-01 VITALS — BP 134/72 | HR 59 | Ht 60.0 in | Wt 114.0 lb

## 2021-10-01 DIAGNOSIS — I10 Essential (primary) hypertension: Secondary | ICD-10-CM

## 2021-10-01 DIAGNOSIS — I480 Paroxysmal atrial fibrillation: Secondary | ICD-10-CM | POA: Diagnosis not present

## 2021-10-01 DIAGNOSIS — D496 Neoplasm of unspecified behavior of brain: Secondary | ICD-10-CM | POA: Diagnosis not present

## 2021-10-01 DIAGNOSIS — I25118 Atherosclerotic heart disease of native coronary artery with other forms of angina pectoris: Secondary | ICD-10-CM | POA: Diagnosis not present

## 2021-10-01 NOTE — Addendum Note (Signed)
Addended by: Darrel Reach on: 10/01/2021 10:24 AM   Modules accepted: Orders

## 2021-10-01 NOTE — Patient Instructions (Signed)
Medication Instructions:  Your physician recommends that you continue on your current medications as directed. Please refer to the Current Medication list given to you today.  *If you need a refill on your cardiac medications before your next appointment, please call your pharmacy*   Lab Work: None If you have labs (blood work) drawn today and your tests are completely normal, you will receive your results only by: [place] (if you have MyChart) OR A paper copy in the mail If you have any lab test that is abnormal or we need to change your treatment, we will call you to review the results.   Testing/Procedures: Your physician has requested that you have an echocardiogram. Echocardiography is a painless test that uses sound waves to create images of your heart. It provides your doctor with information about the size and shape of your heart and how well your heart's chambers and valves are working. This procedure takes approximately one hour. There are no restrictions for this procedure.    Follow-Up: At [facility], you and your health needs are our priority.  As part of our continuing mission to provide you with exceptional heart care, we have created designated Provider Care Teams.  These Care Teams include your primary Cardiologist (physician) and Advanced Practice Providers (APPs -  Physician Assistants and Nurse Practitioners) who all work together to provide you with the care you need, when you need it.  We recommend signing up for the patient portal called "MyChart".  Sign up information is provided on this After Visit Summary.  MyChart is used to connect with patients for Virtual Visits (Telemedicine).  Patients are able to view lab/test results, encounter notes, upcoming appointments, etc.  Non-urgent messages can be sent to your provider as well.   To learn more about what you can do with MyChart, go to [email or web address].    Your next appointment:   6 month(s)  The  format for your next appointment:   In Person  Provider:   You will follow up in the [facility] located at [facility]. Your provider will be: Dr. Jenne Campus in [place]  If primary card or EP is not listed click here to update    :1}    Other Instructions   Important Information About Sugar

## 2021-10-01 NOTE — Progress Notes (Signed)
Cardiology Office Note:    Date:  10/01/2021   ID:  Kimberly Vaughn, DOB [birth year], MRN [id number]  PCP:  Ernestene Kiel, MD  Cardiologist:  Jenne Campus, MD    Referring MD: Ernestene Kiel, MD   Chief Complaint  Patient presents with   Follow-up  Doing well  History of Present Illness:    Kimberly Vaughn is a 76 y.o. female with past medical history significant for brain lymphoma that being aggressive to his chemotherapy by oncology team look like stable.  Paroxysmal atrial fibrillation, remote coronary artery disease.  Essential hypertension dyslipidemia.  She comes today 2 months of follow-up.  Overall seems to be doing well.  She denies have any chest pain tightness squeezing pressure burning chest.  Past Medical History:  Diagnosis Date   A-fib ([place])    Acute blood loss anemia 03/16/2017   Acute cystitis without hematuria 06/06/2017   Acute deep vein thrombosis (DVT) of left upper extremity ([place]) 11/19/2016   unspecified vein, associated with PICC line   Acute megaloblastic anemia due to severe illness 11/12/2016   Anemia associated with chemotherapy 11/26/2016   Angina pectoris ([place]) 03/09/2016   Anginal pain ([place])    Asthma    as a child   Asymmetric SNHL (sensorineural hearing loss) 04/15/2016   Ataxia 03/16/2017   Bacteremia due to methicillin susceptible Staphylococcus aureus (MSSA) 11/12/2016   Benign essential HTN    Bilateral impacted cerumen 01/05/2017   Bilateral lower extremity edema 11/12/2016   BPPV (benign paroxysmal positional vertigo) 12/25/2015   Brain mass 09/09/2016   Brain tumor ([place]) 09/04/2016   Corneal abrasion, left, sequela 12/03/2016   Coronary artery disease    Coronary artery disease involving native coronary artery of native heart 03/30/2016   Formatting of this note might be different from the original. PTCA and drug-eluting stents to right coronary artery in January 2018 Formatting of this note might be different from the original. Overview:   PTCA and drug-eluting stents to right coronary artery in January 2018 Formatting of this note might be different from the original. Overview:  Drug-eluting stents to right coronary artery January   Cranial nerve VI palsy, left 11/26/2016   Cranial nerve VII palsy 11/26/2016   Formatting of this note might be different from the original. Added automatically from request for surgery [id number]   DDD (degenerative disc disease), lumbar    Debility    Degenerative joint disease    Diarrhea 03/16/2017   Diffuse large B cell lymphoma ([place]) 09/16/2016   Dizziness and giddiness    DSAP (disseminated superficial actinic porokeratosis) 11/14/2016   Dyslipidemia (high LDL; low HDL) 03/30/2016   Dysphagia    Dysrhythmia    A fib   Encounter for antineoplastic chemotherapy 04/22/2017   Episodic atrial fibrillation (HCC)    Esophageal reflux    Essential hypertension 09/01/2016   Gastroesophageal reflux disease    GERD (gastroesophageal reflux disease)    Hiatal hernia    History of brain tumor    History of coronary artery stent placement 01/07/2017   History of kidney stones    History of MI (myocardial infarction)    Hyperlipidemia 11/12/2016   Hypertension, essential    Hypoalbuminemia due to protein-calorie malnutrition (HCC)    Hypokalemia    Impaired mobility and ADLs 06/18/2020   Kidney stones    Klebsiella cystitis 11/12/2016   Labile blood pressure    Lagophthalmos of left upper eyelid 12/03/2016   Leucocytosis    Liver cyst  LEFT   Mass of brain 09/04/2016   Myocardial infarction ([place])    Nodule of parotid gland    Non-Hodgkin lymphoma ([place])    Brain   Orthostatic hypotension 03/16/2017   PAF (paroxysmal atrial fibrillation) ([place])    Pancytopenia ([place]) 11/12/2016   Paralytic lagophthalmos of left upper eyelid 03/16/2017   Formatting of this note might be different from the original. Added automatically from request for surgery [id number]   Parotid duct obstruction 04/15/2016   Paroxysmal atrial  fibrillation ([place]) 09/01/2016   Chads2Vas score equals 3, but only 1 episodes in 2016, recent event recorder showing no evidence of atrial fibrillation.   Pneumonia    PONV (postoperative nausea and vomiting)    Post-operative pain    Premature atrial complex    Primary CNS lymphoma ([place]) 09/24/2016   Pulmonary nodule    PVC (premature ventricular contraction)    Nov 2017 & Feb 2018 noted on cardiac monitor worn at home   Pyelonephritis    Sepsis ([place]) 02/15/2018   Severe sepsis ([place]) 11/12/2016   Skipped heart beats 01/07/2017   Steroid-induced hyperglycemia    Subjective tinnitus of left ear 11/27/2015   Sudden hearing loss, left 02/28/2016   Trigger ring finger of right hand 07/07/2018   Unilateral deafness, left 04/25/2020   Unilateral facial paresis 07/01/2016   Vitamin D deficiency 11/26/2016    Past Surgical History:  Procedure Laterality Date   APPLICATION OF CRANIAL NAVIGATION N/A 09/04/2016   Procedure: APPLICATION OF CRANIAL NAVIGATION;  Surgeon: Consuella Lose, MD;  Location: [facility];  Service: Neurosurgery;  Laterality: N/A;   BRAIN SURGERY  09/04/2016   BREAST BIOPSY Bilateral    CARPAL TUNNEL RELEASE Right 09/2013   CATARACTS Bilateral    CERVICAL SPINE SURGERY  04/2012   FINGER SURGERY Right    TRIGGER FINGER RELEASE   PARTIAL THYMECTOMY  1973   pt states doesn't remember this being done   RETROSIGMOID CRANIECTOMY FOR TUMOR RESECTION Left 09/04/2016   Procedure: RETROSIGMOID CRANIECTOMY FOR TUMOR RESECTION WITH BRAINLAB NAVIGATION;  Surgeon: Consuella Lose, MD;  Location: [facility];  Service: Neurosurgery;  Laterality: Left;  CRANIOTOMY TUMOR EXCISION   TONSILLECTOMY  1964   VAGINAL HYSTERECTOMY  1974    Current Medications: Current Meds  Medication Sig   atorvastatin (LIPITOR) 80 MG tablet Take 1 tablet (80 mg total) by mouth daily. (Patient taking differently: Take 40 mg by mouth daily.)   Carboxymethylcellulose Sod PF 0.5 % SOLN Apply 1 drop to eye 4 (four) times  daily. One drop in left eye 4 times daily.   Cholecalciferol (VITAMIN D3) 2000 units TABS Take 2,000 Units by mouth daily.   Cyanocobalamin (VITAMIN B-12) 5000 MCG SUBL Take 5,000 mcg by mouth daily.    erythromycin ophthalmic ointment Place 1 application into the left eye 4 (four) times daily. Unknown strength   esomeprazole (NEXIUM) 40 MG capsule Take 40 mg by mouth daily.   lenalidomide (REVLIMID) 10 MG capsule Take 10 mg by mouth daily.   magnesium chloride (SLOW-MAG) 64 MG TBEC SR tablet Take 1 tablet by mouth 2 (two) times daily.    metoprolol tartrate (LOPRESSOR) 25 MG tablet TAKE 1 TABLET(25 MG) BY MOUTH TWICE DAILY (Patient taking differently: Take 12.5 mg by mouth 2 (two) times daily.)   nitrofurantoin, macrocrystal-monohydrate, (MACROBID) 100 MG capsule Take 100 mg by mouth daily.   nitroGLYCERIN (NITROSTAT) 0.4 MG SL tablet Place 1 tablet (0.4 mg total) under the tongue every 5 (five) minutes as needed  for chest pain.   Potassium Chloride ER 20 MEQ TBCR Take 1 tablet by mouth 2 (two) times daily. 2  In the morning and 1 at night   rivaroxaban (XARELTO) 10 MG TABS tablet Take 10 mg by mouth daily with supper.    sennosides-docusate sodium (SENOKOT-S) 8.6-50 MG tablet Take 1 tablet by mouth 2 (two) times daily.   tamsulosin (FLOMAX) 0.4 MG CAPS capsule Take 0.4 mg by mouth daily.     Allergies:   Patient has no known allergies.   Social History   Socioeconomic History   Marital status: Married    Spouse name: Not on file   Number of children: 1   Years of education: 12   Highest education level: Not on file  Occupational History   Occupation: Retired school  Tobacco Use   Smoking status: Former    Types: Cigarettes    Quit date: 10/23/2005    Years since quitting: 15.9   Smokeless tobacco: Never  Vaping Use   Vaping Use: Never used  Substance and Sexual Activity   Alcohol use: No   Drug use: No   Sexual activity: Not Currently    Comment: MARRIED  Other Topics  Concern   Not on file  Social History Narrative   Lives with husband   Caffeine use: 2 cups coffee per day   Admitted to [facility]   Former smoker - stopped 2007   Alcohol none   DNR   Social Determinants of Health   Financial Resource Strain: Not on file  Food Insecurity: Not on file  Transportation Needs: Not on file  Physical Activity: Not on file  Stress: Not on file  Social Connections: Not on file     Family History: The patient's family history includes Heart attack (age of onset: 60) in her father; Heart disease in her father; Heart disease (age of onset: 48) in her mother; Hypertension in her brother, daughter, mother, sister, and another family member; Lymphoma in her sister and another family member; Melanoma in her brother and another family member; Osteoarthritis in an other family member; Psoriasis in her mother; Rheum arthritis in her brother and mother. ROS:   Please see the history of present illness.    All 14 point review of systems negative except as described per history of present illness  EKGs/Labs/Other Studies Reviewed:      Recent Labs: No results found for requested labs within last 365 days.  Recent Lipid Panel No results found for: "CHOL", "TRIG", "HDL", "CHOLHDL", "VLDL", "LDLCALC", "LDLDIRECT"  Physical Exam:    VS:  BP 134/72 (BP Location: Left Arm, Patient Position: Sitting)   Pulse (!) 59   Ht 5' (1.524 m)   Wt 114 lb (51.7 kg)   LMP  (LMP Unknown)   SpO2 97%   BMI 22.26 kg/m     Wt Readings from Last 3 Encounters:  10/01/21 114 lb (51.7 kg)  03/27/21 114 lb (51.7 kg)  06/21/20 109 lb (49.4 kg)     GEN:  Well nourished, well developed in no acute distress HEENT: Normal NECK: No JVD; No carotid bruits LYMPHATICS: No lymphadenopathy CARDIAC: RRR, no murmurs, no rubs, no gallops RESPIRATORY:  Clear to auscultation without rales, wheezing or rhonchi  ABDOMEN: Soft, non-tender, non-distended MUSCULOSKELETAL:  No  edema; No deformity  SKIN: Warm and dry LOWER EXTREMITIES: no swelling NEUROLOGIC:  Alert and oriented x 3 PSYCHIATRIC:  Normal affect   ASSESSMENT:    1.  Paroxysmal atrial fibrillation (HCC)   2. Coronary artery disease of native artery of native heart with stable angina pectoris ([place])   3. Benign essential HTN   4. Brain tumor ([place])    PLAN:    In order of problems listed above:  Paroxysmal atrial fibrillation denies have any palpitation, she is on small dose of anticoagulation because of brain cancer which I will continue.  Likely denies having any recent recurrences of arrhythmia however I think there is some value of doing echocardiogram to look at the left ventricle ejection fraction as well as atrial size and I will order test Benign essential hypertension: Blood pressure well controlled continue present management. Dyslipidemia I did review K PN which show me her HDL 79 LDL 28.  We will continue present management   Medication Adjustments/Labs and Tests Ordered: Current medicines are reviewed at length with the patient today.  Concerns regarding medicines are outlined above.  No orders of the defined types were placed in this encounter.  Medication changes: No orders of the defined types were placed in this encounter.   Signed, Park Liter, MD, [facility] 10/01/2021 10:19 AM    [place]

## 2021-10-02 DIAGNOSIS — Z8679 Personal history of other diseases of the circulatory system: Secondary | ICD-10-CM | POA: Diagnosis not present

## 2021-10-02 DIAGNOSIS — C8589 Other specified types of non-Hodgkin lymphoma, extranodal and solid organ sites: Secondary | ICD-10-CM | POA: Diagnosis not present

## 2021-10-02 DIAGNOSIS — Z8616 Personal history of COVID-19: Secondary | ICD-10-CM | POA: Diagnosis not present

## 2021-10-02 DIAGNOSIS — H918X2 Other specified hearing loss, left ear: Secondary | ICD-10-CM | POA: Diagnosis not present

## 2021-10-02 DIAGNOSIS — R278 Other lack of coordination: Secondary | ICD-10-CM | POA: Diagnosis not present

## 2021-10-02 DIAGNOSIS — Z95828 Presence of other vascular implants and grafts: Secondary | ICD-10-CM | POA: Diagnosis not present

## 2021-10-02 DIAGNOSIS — D649 Anemia, unspecified: Secondary | ICD-10-CM | POA: Diagnosis not present

## 2021-10-02 DIAGNOSIS — H02402 Unspecified ptosis of left eyelid: Secondary | ICD-10-CM | POA: Diagnosis not present

## 2021-10-02 DIAGNOSIS — Z7901 Long term (current) use of anticoagulants: Secondary | ICD-10-CM | POA: Diagnosis not present

## 2021-10-02 DIAGNOSIS — G510 Bell's palsy: Secondary | ICD-10-CM | POA: Diagnosis not present

## 2021-10-02 DIAGNOSIS — Z86718 Personal history of other venous thrombosis and embolism: Secondary | ICD-10-CM | POA: Diagnosis not present

## 2021-10-03 ENCOUNTER — Ambulatory Visit (INDEPENDENT_AMBULATORY_CARE_PROVIDER_SITE_OTHER): Payer: Medicare PPO

## 2021-10-03 DIAGNOSIS — I480 Paroxysmal atrial fibrillation: Secondary | ICD-10-CM

## 2021-10-03 DIAGNOSIS — I25118 Atherosclerotic heart disease of native coronary artery with other forms of angina pectoris: Secondary | ICD-10-CM | POA: Diagnosis not present

## 2021-10-03 LAB — ECHOCARDIOGRAM COMPLETE
Area-P 1/2: 2.17 cm2
MV M vel: 4.61 m/s
MV Peak grad: 84.8 mmHg
S' Lateral: 2 cm

## 2021-10-09 ENCOUNTER — Telehealth: Payer: Self-pay

## 2021-10-09 NOTE — Telephone Encounter (Signed)
-----   Message from Park Liter, MD sent at 10/07/2021 12:40 PM EDT ----- Echocardiogram showed preserved left ventricle ejection fraction, mild left ventricle hypertrophy, no significant valvular pathology.  Overall looks good

## 2021-10-09 NOTE — Telephone Encounter (Signed)
Spoke with Lynn(on DPR) notified of results

## 2021-10-21 DIAGNOSIS — C8589 Other specified types of non-Hodgkin lymphoma, extranodal and solid organ sites: Secondary | ICD-10-CM | POA: Diagnosis not present

## 2021-11-13 DIAGNOSIS — R278 Other lack of coordination: Secondary | ICD-10-CM | POA: Diagnosis not present

## 2021-11-13 DIAGNOSIS — Z86718 Personal history of other venous thrombosis and embolism: Secondary | ICD-10-CM | POA: Diagnosis not present

## 2021-11-13 DIAGNOSIS — C8589 Other specified types of non-Hodgkin lymphoma, extranodal and solid organ sites: Secondary | ICD-10-CM | POA: Diagnosis not present

## 2021-11-13 DIAGNOSIS — G510 Bell's palsy: Secondary | ICD-10-CM | POA: Diagnosis not present

## 2021-11-13 DIAGNOSIS — H918X2 Other specified hearing loss, left ear: Secondary | ICD-10-CM | POA: Diagnosis not present

## 2021-11-13 DIAGNOSIS — Z955 Presence of coronary angioplasty implant and graft: Secondary | ICD-10-CM | POA: Diagnosis not present

## 2021-11-13 DIAGNOSIS — R630 Anorexia: Secondary | ICD-10-CM | POA: Diagnosis not present

## 2021-11-13 DIAGNOSIS — D649 Anemia, unspecified: Secondary | ICD-10-CM | POA: Diagnosis not present

## 2021-11-13 DIAGNOSIS — H02402 Unspecified ptosis of left eyelid: Secondary | ICD-10-CM | POA: Diagnosis not present

## 2021-11-13 DIAGNOSIS — Z8616 Personal history of COVID-19: Secondary | ICD-10-CM | POA: Diagnosis not present

## 2021-11-13 DIAGNOSIS — Z7901 Long term (current) use of anticoagulants: Secondary | ICD-10-CM | POA: Diagnosis not present

## 2021-11-13 DIAGNOSIS — R42 Dizziness and giddiness: Secondary | ICD-10-CM | POA: Diagnosis not present

## 2021-11-13 DIAGNOSIS — Z7961 Long term (current) use of immunomodulator: Secondary | ICD-10-CM | POA: Diagnosis not present

## 2021-11-30 ENCOUNTER — Encounter: Payer: Self-pay | Admitting: Cardiology

## 2021-12-01 ENCOUNTER — Other Ambulatory Visit: Payer: Self-pay

## 2021-12-01 DIAGNOSIS — C8589 Other specified types of non-Hodgkin lymphoma, extranodal and solid organ sites: Secondary | ICD-10-CM | POA: Diagnosis not present

## 2021-12-01 MED ORDER — ATORVASTATIN CALCIUM 40 MG PO TABS: 40.0000 mg | ORAL_TABLET | Freq: Every day | ORAL | 3 refills | Status: DC

## 2021-12-03 DIAGNOSIS — Z961 Presence of intraocular lens: Secondary | ICD-10-CM | POA: Diagnosis not present

## 2021-12-03 DIAGNOSIS — H35441 Age-related reticular degeneration of retina, right eye: Secondary | ICD-10-CM | POA: Diagnosis not present

## 2021-12-03 DIAGNOSIS — H43391 Other vitreous opacities, right eye: Secondary | ICD-10-CM | POA: Diagnosis not present

## 2021-12-03 DIAGNOSIS — H16221 Keratoconjunctivitis sicca, not specified as Sjogren's, right eye: Secondary | ICD-10-CM | POA: Diagnosis not present

## 2021-12-03 DIAGNOSIS — H401111 Primary open-angle glaucoma, right eye, mild stage: Secondary | ICD-10-CM | POA: Diagnosis not present

## 2021-12-11 DIAGNOSIS — Z8679 Personal history of other diseases of the circulatory system: Secondary | ICD-10-CM | POA: Diagnosis not present

## 2021-12-11 DIAGNOSIS — R278 Other lack of coordination: Secondary | ICD-10-CM | POA: Diagnosis not present

## 2021-12-11 DIAGNOSIS — I25119 Atherosclerotic heart disease of native coronary artery with unspecified angina pectoris: Secondary | ICD-10-CM | POA: Diagnosis not present

## 2021-12-11 DIAGNOSIS — G510 Bell's palsy: Secondary | ICD-10-CM | POA: Diagnosis not present

## 2021-12-11 DIAGNOSIS — H02402 Unspecified ptosis of left eyelid: Secondary | ICD-10-CM | POA: Diagnosis not present

## 2021-12-11 DIAGNOSIS — Z7901 Long term (current) use of anticoagulants: Secondary | ICD-10-CM | POA: Diagnosis not present

## 2021-12-11 DIAGNOSIS — C8589 Other specified types of non-Hodgkin lymphoma, extranodal and solid organ sites: Secondary | ICD-10-CM | POA: Diagnosis not present

## 2021-12-11 DIAGNOSIS — Z955 Presence of coronary angioplasty implant and graft: Secondary | ICD-10-CM | POA: Diagnosis not present

## 2021-12-11 DIAGNOSIS — D649 Anemia, unspecified: Secondary | ICD-10-CM | POA: Diagnosis not present

## 2021-12-11 DIAGNOSIS — H9190 Unspecified hearing loss, unspecified ear: Secondary | ICD-10-CM | POA: Diagnosis not present

## 2021-12-11 DIAGNOSIS — Z79899 Other long term (current) drug therapy: Secondary | ICD-10-CM | POA: Diagnosis not present

## 2021-12-11 DIAGNOSIS — Z7961 Long term (current) use of immunomodulator: Secondary | ICD-10-CM | POA: Diagnosis not present

## 2021-12-11 DIAGNOSIS — Z86718 Personal history of other venous thrombosis and embolism: Secondary | ICD-10-CM | POA: Diagnosis not present

## 2022-01-01 ENCOUNTER — Telehealth: Payer: Self-pay

## 2022-01-01 NOTE — Patient Outreach (Signed)
  Care Coordination   01/01/2022 Name: Kimberly Vaughn MRN: [id number] DOB: 02/05/1946   Care Coordination Outreach Attempts:  An unsuccessful telephone outreach was attempted today to offer the patient information about available care coordination services as a benefit of their health plan.   Follow Up Plan:  Additional outreach attempts will be made to offer the patient care coordination information and services.   Encounter Outcome:  No Answer  Care Coordination Interventions Activated:  No   Care Coordination Interventions:  No, not indicated    Tomasa Rand, RN, BSN, CEN [facility] [employer] [phone]

## 2022-01-02 DIAGNOSIS — C8589 Other specified types of non-Hodgkin lymphoma, extranodal and solid organ sites: Secondary | ICD-10-CM | POA: Diagnosis not present

## 2022-01-14 DIAGNOSIS — C8589 Other specified types of non-Hodgkin lymphoma, extranodal and solid organ sites: Secondary | ICD-10-CM | POA: Diagnosis not present

## 2022-01-14 DIAGNOSIS — G9389 Other specified disorders of brain: Secondary | ICD-10-CM | POA: Diagnosis not present

## 2022-01-19 DIAGNOSIS — H918X2 Other specified hearing loss, left ear: Secondary | ICD-10-CM | POA: Diagnosis not present

## 2022-01-19 DIAGNOSIS — Z7901 Long term (current) use of anticoagulants: Secondary | ICD-10-CM | POA: Diagnosis not present

## 2022-01-19 DIAGNOSIS — I25119 Atherosclerotic heart disease of native coronary artery with unspecified angina pectoris: Secondary | ICD-10-CM | POA: Diagnosis not present

## 2022-01-19 DIAGNOSIS — H02402 Unspecified ptosis of left eyelid: Secondary | ICD-10-CM | POA: Diagnosis not present

## 2022-01-19 DIAGNOSIS — Z95828 Presence of other vascular implants and grafts: Secondary | ICD-10-CM | POA: Diagnosis not present

## 2022-01-19 DIAGNOSIS — C8589 Other specified types of non-Hodgkin lymphoma, extranodal and solid organ sites: Secondary | ICD-10-CM | POA: Diagnosis not present

## 2022-01-19 DIAGNOSIS — D649 Anemia, unspecified: Secondary | ICD-10-CM | POA: Diagnosis not present

## 2022-01-19 DIAGNOSIS — Z955 Presence of coronary angioplasty implant and graft: Secondary | ICD-10-CM | POA: Diagnosis not present

## 2022-01-19 DIAGNOSIS — Z7961 Long term (current) use of immunomodulator: Secondary | ICD-10-CM | POA: Diagnosis not present

## 2022-01-19 DIAGNOSIS — G510 Bell's palsy: Secondary | ICD-10-CM | POA: Diagnosis not present

## 2022-01-19 DIAGNOSIS — Z86718 Personal history of other venous thrombosis and embolism: Secondary | ICD-10-CM | POA: Diagnosis not present

## 2022-01-19 DIAGNOSIS — R278 Other lack of coordination: Secondary | ICD-10-CM | POA: Diagnosis not present

## 2022-02-02 ENCOUNTER — Other Ambulatory Visit: Payer: Self-pay | Admitting: Cardiology

## 2022-02-03 ENCOUNTER — Telehealth: Payer: Self-pay

## 2022-02-03 NOTE — Patient Outreach (Signed)
  Care Coordination   Initial Visit Note   02/03/2022 Name: Kimberly Vaughn MRN: [id number] DOB: [birth year]  Kimberly Vaughn is a 77 y.o. year old female who sees Prochnau, Chrys Racer, MD for primary care. I spoke with  Pricilla Holm Beigel by phone today.  What matters to the patients health and wellness today?  Placed call to patient and offer [place] coordination program.  Patient reports to me that she is doing well and denies any needs today.    SDOH assessments and interventions completed:  No     Care Coordination Interventions:  No, not indicated   Follow up plan: No further intervention required.   Encounter Outcome:  Pt. Refused   Tomasa Rand, RN, BSN, CEN [facility] [employer] [phone]

## 2022-02-17 DIAGNOSIS — Z6821 Body mass index (BMI) 21.0-21.9, adult: Secondary | ICD-10-CM | POA: Diagnosis not present

## 2022-02-17 DIAGNOSIS — H6121 Impacted cerumen, right ear: Secondary | ICD-10-CM | POA: Diagnosis not present

## 2022-02-17 DIAGNOSIS — E785 Hyperlipidemia, unspecified: Secondary | ICD-10-CM | POA: Diagnosis not present

## 2022-02-17 DIAGNOSIS — Z1331 Encounter for screening for depression: Secondary | ICD-10-CM | POA: Diagnosis not present

## 2022-02-17 DIAGNOSIS — Z0000 Encounter for general adult medical examination without abnormal findings: Secondary | ICD-10-CM | POA: Diagnosis not present

## 2022-02-17 DIAGNOSIS — K219 Gastro-esophageal reflux disease without esophagitis: Secondary | ICD-10-CM | POA: Diagnosis not present

## 2022-02-17 DIAGNOSIS — I25119 Atherosclerotic heart disease of native coronary artery with unspecified angina pectoris: Secondary | ICD-10-CM | POA: Diagnosis not present

## 2022-02-17 DIAGNOSIS — Z1231 Encounter for screening mammogram for malignant neoplasm of breast: Secondary | ICD-10-CM | POA: Diagnosis not present

## 2022-02-17 DIAGNOSIS — C8589 Other specified types of non-Hodgkin lymphoma, extranodal and solid organ sites: Secondary | ICD-10-CM | POA: Diagnosis not present

## 2022-02-19 DIAGNOSIS — D649 Anemia, unspecified: Secondary | ICD-10-CM | POA: Diagnosis not present

## 2022-02-19 DIAGNOSIS — C8519 Unspecified B-cell lymphoma, extranodal and solid organ sites: Secondary | ICD-10-CM | POA: Diagnosis not present

## 2022-02-19 DIAGNOSIS — Z86718 Personal history of other venous thrombosis and embolism: Secondary | ICD-10-CM | POA: Diagnosis not present

## 2022-02-19 DIAGNOSIS — Z95828 Presence of other vascular implants and grafts: Secondary | ICD-10-CM | POA: Diagnosis not present

## 2022-02-19 DIAGNOSIS — Z7901 Long term (current) use of anticoagulants: Secondary | ICD-10-CM | POA: Diagnosis not present

## 2022-02-19 DIAGNOSIS — H9190 Unspecified hearing loss, unspecified ear: Secondary | ICD-10-CM | POA: Diagnosis not present

## 2022-02-19 DIAGNOSIS — D531 Other megaloblastic anemias, not elsewhere classified: Secondary | ICD-10-CM | POA: Diagnosis not present

## 2022-02-19 DIAGNOSIS — H02402 Unspecified ptosis of left eyelid: Secondary | ICD-10-CM | POA: Diagnosis not present

## 2022-02-19 DIAGNOSIS — G510 Bell's palsy: Secondary | ICD-10-CM | POA: Diagnosis not present

## 2022-02-19 DIAGNOSIS — C8589 Other specified types of non-Hodgkin lymphoma, extranodal and solid organ sites: Secondary | ICD-10-CM | POA: Diagnosis not present

## 2022-02-19 DIAGNOSIS — I25119 Atherosclerotic heart disease of native coronary artery with unspecified angina pectoris: Secondary | ICD-10-CM | POA: Diagnosis not present

## 2022-02-19 DIAGNOSIS — R278 Other lack of coordination: Secondary | ICD-10-CM | POA: Diagnosis not present

## 2022-02-19 DIAGNOSIS — U071 COVID-19: Secondary | ICD-10-CM | POA: Diagnosis not present

## 2022-02-19 DIAGNOSIS — Z66 Do not resuscitate: Secondary | ICD-10-CM | POA: Diagnosis not present

## 2022-03-09 DIAGNOSIS — Z6822 Body mass index (BMI) 22.0-22.9, adult: Secondary | ICD-10-CM | POA: Diagnosis not present

## 2022-03-09 DIAGNOSIS — H6120 Impacted cerumen, unspecified ear: Secondary | ICD-10-CM | POA: Diagnosis not present

## 2022-03-26 DIAGNOSIS — H02402 Unspecified ptosis of left eyelid: Secondary | ICD-10-CM | POA: Diagnosis not present

## 2022-03-26 DIAGNOSIS — H918X2 Other specified hearing loss, left ear: Secondary | ICD-10-CM | POA: Diagnosis not present

## 2022-03-26 DIAGNOSIS — D61818 Other pancytopenia: Secondary | ICD-10-CM | POA: Diagnosis not present

## 2022-03-26 DIAGNOSIS — H9190 Unspecified hearing loss, unspecified ear: Secondary | ICD-10-CM | POA: Diagnosis not present

## 2022-03-26 DIAGNOSIS — G629 Polyneuropathy, unspecified: Secondary | ICD-10-CM | POA: Diagnosis not present

## 2022-03-26 DIAGNOSIS — G609 Hereditary and idiopathic neuropathy, unspecified: Secondary | ICD-10-CM | POA: Diagnosis not present

## 2022-03-26 DIAGNOSIS — D531 Other megaloblastic anemias, not elsewhere classified: Secondary | ICD-10-CM | POA: Diagnosis not present

## 2022-03-26 DIAGNOSIS — C8589 Other specified types of non-Hodgkin lymphoma, extranodal and solid organ sites: Secondary | ICD-10-CM | POA: Diagnosis not present

## 2022-03-26 DIAGNOSIS — Z7901 Long term (current) use of anticoagulants: Secondary | ICD-10-CM | POA: Diagnosis not present

## 2022-03-26 DIAGNOSIS — C8519 Unspecified B-cell lymphoma, extranodal and solid organ sites: Secondary | ICD-10-CM | POA: Diagnosis not present

## 2022-03-26 DIAGNOSIS — T82898A Other specified complication of vascular prosthetic devices, implants and grafts, initial encounter: Secondary | ICD-10-CM | POA: Diagnosis not present

## 2022-03-26 DIAGNOSIS — G510 Bell's palsy: Secondary | ICD-10-CM | POA: Diagnosis not present

## 2022-03-26 DIAGNOSIS — I25119 Atherosclerotic heart disease of native coronary artery with unspecified angina pectoris: Secondary | ICD-10-CM | POA: Diagnosis not present

## 2022-03-30 DIAGNOSIS — G609 Hereditary and idiopathic neuropathy, unspecified: Secondary | ICD-10-CM | POA: Insufficient documentation

## 2022-04-27 DIAGNOSIS — D496 Neoplasm of unspecified behavior of brain: Secondary | ICD-10-CM | POA: Diagnosis not present

## 2022-04-27 DIAGNOSIS — C8589 Other specified types of non-Hodgkin lymphoma, extranodal and solid organ sites: Secondary | ICD-10-CM | POA: Diagnosis not present

## 2022-04-27 DIAGNOSIS — Z9889 Other specified postprocedural states: Secondary | ICD-10-CM | POA: Diagnosis not present

## 2022-05-01 DIAGNOSIS — M25551 Pain in right hip: Secondary | ICD-10-CM | POA: Diagnosis not present

## 2022-05-01 DIAGNOSIS — M7061 Trochanteric bursitis, right hip: Secondary | ICD-10-CM | POA: Diagnosis not present

## 2022-05-11 DIAGNOSIS — G609 Hereditary and idiopathic neuropathy, unspecified: Secondary | ICD-10-CM | POA: Diagnosis not present

## 2022-05-11 DIAGNOSIS — H903 Sensorineural hearing loss, bilateral: Secondary | ICD-10-CM | POA: Diagnosis not present

## 2022-05-11 DIAGNOSIS — C8589 Other specified types of non-Hodgkin lymphoma, extranodal and solid organ sites: Secondary | ICD-10-CM | POA: Diagnosis not present

## 2022-05-11 DIAGNOSIS — G5611 Other lesions of median nerve, right upper limb: Secondary | ICD-10-CM | POA: Diagnosis not present

## 2022-06-10 DIAGNOSIS — H401111 Primary open-angle glaucoma, right eye, mild stage: Secondary | ICD-10-CM | POA: Diagnosis not present

## 2022-06-10 DIAGNOSIS — H35441 Age-related reticular degeneration of retina, right eye: Secondary | ICD-10-CM | POA: Diagnosis not present

## 2022-06-10 DIAGNOSIS — Z961 Presence of intraocular lens: Secondary | ICD-10-CM | POA: Diagnosis not present

## 2022-06-10 DIAGNOSIS — H16221 Keratoconjunctivitis sicca, not specified as Sjogren's, right eye: Secondary | ICD-10-CM | POA: Diagnosis not present

## 2022-06-10 DIAGNOSIS — H43391 Other vitreous opacities, right eye: Secondary | ICD-10-CM | POA: Diagnosis not present

## 2022-06-15 DIAGNOSIS — I480 Paroxysmal atrial fibrillation: Secondary | ICD-10-CM | POA: Diagnosis not present

## 2022-06-15 DIAGNOSIS — K219 Gastro-esophageal reflux disease without esophagitis: Secondary | ICD-10-CM | POA: Diagnosis not present

## 2022-06-15 DIAGNOSIS — I82622 Acute embolism and thrombosis of deep veins of left upper extremity: Secondary | ICD-10-CM | POA: Diagnosis not present

## 2022-06-15 DIAGNOSIS — C8589 Other specified types of non-Hodgkin lymphoma, extranodal and solid organ sites: Secondary | ICD-10-CM | POA: Diagnosis not present

## 2022-06-22 DIAGNOSIS — C8589 Other specified types of non-Hodgkin lymphoma, extranodal and solid organ sites: Secondary | ICD-10-CM | POA: Diagnosis not present

## 2022-06-25 DIAGNOSIS — C8589 Other specified types of non-Hodgkin lymphoma, extranodal and solid organ sites: Secondary | ICD-10-CM | POA: Diagnosis not present

## 2022-06-27 NOTE — Progress Notes (Signed)
Cardiology Office Note:    Date:  06/29/2022   ID:  Kimberly Vaughn, DOB 1945-06-18, MRN [id number]  PCP:  Philemon Kingdom, MD   [facility] Providers Cardiologist:  Gypsy Balsam, MD     Referring MD: Philemon Kingdom, MD   CC: follow up CAD  History of Present Illness:    Kimberly Vaughn is a 77 y.o. female with a hx of CAD s/p DES to mid RCA 2018, paroxysmal atrial fibrillation, DVT, pulmonary nodule, GERD, BPPV, cranial nerve palsy, history of brain tumor, diffuse large B-cell lymphoma, dyslipidemia.  She established care initially with Dr. Elberta Fortis in 2017 for atrial fibrillation and dizziness.  She had been diagnosed with atrial fibrillation in 2016 while undergoing a stress test.  She had previously not been on anticoagulation however her CHA2DS2-VASc or was 3 so she was started on Eliquis.  She wore a monitor in 2021 which was predominantly sinus, heart rate 46 to 127 bpm, average heart rate 72 bpm.  4 episodes of narrow complex tachycardia.  2 runs of ventricular tachycardia.  Most recently she was evaluated by Dr. Bing Matter on 10/01/2021 at that time she was doing well from a cardiac perspective.  She continued to undergo aggressive chemotherapy for brain lymphoma but was doing well from that perspective as well.  Echo on 10/03/2021 revealed an EF of 60 to 65%, mild concentric LVH, grade 1 DD, mitral valve is degenerative, aortic valve regurgitation was not visualized.  She presents today accompanied by her neighbor for follow-up of her CAD.  She states she was recently told that she is in remission with her brain lymphoma.  She continues to need a walker for ambulation, but is doing remarkably well. She denies chest pain, palpitations, dyspnea, pnd, orthopnea, n, v, dizziness, syncope, edema, weight gain, or early satiety.   Past Medical History:  Diagnosis Date   A-fib (HCC)    Acute blood loss anemia 03/16/2017   Acute cystitis without hematuria 06/06/2017   Acute  deep vein thrombosis (DVT) of left upper extremity (HCC) 11/19/2016   unspecified vein, associated with PICC line   Acute megaloblastic anemia due to severe illness 11/12/2016   Anemia associated with chemotherapy 11/26/2016   Angina pectoris (HCC) 03/09/2016   Anginal pain (HCC)    Asthma    as a child   Asymmetric SNHL (sensorineural hearing loss) 04/15/2016   Ataxia 03/16/2017   Bacteremia due to methicillin susceptible Staphylococcus aureus (MSSA) 11/12/2016   Benign essential HTN    Bilateral impacted cerumen 01/05/2017   Bilateral lower extremity edema 11/12/2016   BPPV (benign paroxysmal positional vertigo) 12/25/2015   Brain mass 09/09/2016   Brain tumor (HCC) 09/04/2016   Corneal abrasion, left, sequela 12/03/2016   Coronary artery disease    Coronary artery disease involving native coronary artery of native heart 03/30/2016   Formatting of this note might be different from the original. PTCA and drug-eluting stents to right coronary artery in January 2018 Formatting of this note might be different from the original. Overview:  PTCA and drug-eluting stents to right coronary artery in January 2018 Formatting of this note might be different from the original. Overview:  Drug-eluting stents to right coronary artery January   Cranial nerve VI palsy, left 11/26/2016   Cranial nerve VII palsy 11/26/2016   Formatting of this note might be different from the original. Added automatically from request for surgery [id number]   DDD (degenerative disc disease), lumbar    Debility  Degenerative joint disease    Diarrhea 03/16/2017   Diffuse large B cell lymphoma (HCC) 09/16/2016   Dizziness and giddiness    DSAP (disseminated superficial actinic porokeratosis) 11/14/2016   Dyslipidemia (high LDL; low HDL) 03/30/2016   Dysphagia    Dysrhythmia    A fib   Encounter for antineoplastic chemotherapy 04/22/2017   Episodic atrial fibrillation (HCC)    Esophageal reflux    Essential hypertension 09/01/2016    Gastroesophageal reflux disease    GERD (gastroesophageal reflux disease)    Hiatal hernia    History of brain tumor    History of coronary artery stent placement 01/07/2017   History of kidney stones    History of MI (myocardial infarction)    Hyperlipidemia 11/12/2016   Hypertension, essential    Hypoalbuminemia due to protein-calorie malnutrition (HCC)    Hypokalemia    Impaired mobility and ADLs 06/18/2020   Kidney stones    Klebsiella cystitis 11/12/2016   Labile blood pressure    Lagophthalmos of left upper eyelid 12/03/2016   Leucocytosis    Liver cyst LEFT   Mass of brain 09/04/2016   Myocardial infarction (HCC)    Nodule of parotid gland    Non-Hodgkin lymphoma (HCC)    Brain   Orthostatic hypotension 03/16/2017   PAF (paroxysmal atrial fibrillation) (HCC)    Pancytopenia (HCC) 11/12/2016   Paralytic lagophthalmos of left upper eyelid 03/16/2017   Formatting of this note might be different from the original. Added automatically from request for surgery [id number]   Parotid duct obstruction 04/15/2016   Paroxysmal atrial fibrillation (HCC) 09/01/2016   Chads2Vas score equals 3, but only 1 episodes in 2016, recent event recorder showing no evidence of atrial fibrillation.   Pneumonia    PONV (postoperative nausea and vomiting)    Post-operative pain    Premature atrial complex    Primary CNS lymphoma (HCC) 09/24/2016   Pulmonary nodule    PVC (premature ventricular contraction)    Nov 2017 & Feb 2018 noted on cardiac monitor worn at home   Pyelonephritis    Sepsis (HCC) 02/15/2018   Severe sepsis (HCC) 11/12/2016   Skipped heart beats 01/07/2017   Steroid-induced hyperglycemia    Subjective tinnitus of left ear 11/27/2015   Sudden hearing loss, left 02/28/2016   Trigger ring finger of right hand 07/07/2018   Unilateral deafness, left 04/25/2020   Unilateral facial paresis 07/01/2016   Vitamin D deficiency 11/26/2016    Past Surgical History:  Procedure Laterality Date   APPLICATION  OF CRANIAL NAVIGATION N/A 09/04/2016   Procedure: APPLICATION OF CRANIAL NAVIGATION;  Surgeon: Lisbeth Renshaw, MD;  Location: [facility] OR;  Service: Neurosurgery;  Laterality: N/A;   BRAIN SURGERY  09/04/2016   BREAST BIOPSY Bilateral    CARPAL TUNNEL RELEASE Right 09/2013   CATARACTS Bilateral    CERVICAL SPINE SURGERY  04/2012   FINGER SURGERY Right    TRIGGER FINGER RELEASE   PARTIAL THYMECTOMY  1973   pt states doesn't remember this being done   RETROSIGMOID CRANIECTOMY FOR TUMOR RESECTION Left 09/04/2016   Procedure: RETROSIGMOID CRANIECTOMY FOR TUMOR RESECTION WITH BRAINLAB NAVIGATION;  Surgeon: Lisbeth Renshaw, MD;  Location: [facility] OR;  Service: Neurosurgery;  Laterality: Left;  CRANIOTOMY TUMOR EXCISION   TONSILLECTOMY  1964   VAGINAL HYSTERECTOMY  1974    Current Medications: Current Meds  Medication Sig   atorvastatin (LIPITOR) 40 MG tablet Take 1 tablet (40 mg total) by mouth daily.   Carboxymethylcellulose Sod PF 0.5 %  SOLN Apply 1 drop to eye 4 (four) times daily. One drop in left eye 4 times daily.   Cholecalciferol (VITAMIN D3) 2000 units TABS Take 2,000 Units by mouth daily.   Cyanocobalamin (VITAMIN B-12) 5000 MCG SUBL Take 5,000 mcg by mouth daily.    erythromycin ophthalmic ointment Place 1 application into the left eye 4 (four) times daily. Unknown strength   esomeprazole (NEXIUM) 40 MG capsule Take 40 mg by mouth daily.   lenalidomide (REVLIMID) 10 MG capsule Take 10 mg by mouth daily.   magnesium chloride (SLOW-MAG) 64 MG TBEC SR tablet Take 1 tablet by mouth 2 (two) times daily.    meclizine (ANTIVERT) 25 MG tablet Take 25 mg by mouth daily as needed for dizziness.   metoprolol tartrate (LOPRESSOR) 25 MG tablet TAKE 1 TABLET(25 MG) BY MOUTH TWICE DAILY   nitrofurantoin, macrocrystal-monohydrate, (MACROBID) 100 MG capsule Take 100 mg by mouth daily.   nitroGLYCERIN (NITROSTAT) 0.4 MG SL tablet Place 1 tablet (0.4 mg total) under the tongue every 5 (five) minutes as  needed for chest pain.   Potassium Chloride ER 20 MEQ TBCR Take 1 tablet by mouth 2 (two) times daily. 2  In the morning and 1 at night   rivaroxaban (XARELTO) 10 MG TABS tablet Take 10 mg by mouth daily with supper.    sennosides-docusate sodium (SENOKOT-S) 8.6-50 MG tablet Take 1 tablet by mouth 2 (two) times daily.   tamsulosin (FLOMAX) 0.4 MG CAPS capsule Take 0.4 mg by mouth daily.     Allergies:   Patient has no known allergies.   Social History   Socioeconomic History   Marital status: Married    Spouse name: Not on file   Number of children: 1   Years of education: 12   Highest education level: Not on file  Occupational History   Occupation: Retired school  Tobacco Use   Smoking status: Former    Types: Cigarettes    Quit date: 10/23/2005    Years since quitting: 16.6   Smokeless tobacco: Never  Vaping Use   Vaping Use: Never used  Substance and Sexual Activity   Alcohol use: No   Drug use: No   Sexual activity: Not Currently    Comment: MARRIED  Other Topics Concern   Not on file  Social History Narrative   Lives with husband   Caffeine use: 2 cups coffee per day   Admitted to [facility]   Former smoker - stopped 2007   Alcohol none   DNR   Social Determinants of Health   Financial Resource Strain: Not on file  Food Insecurity: Not on file  Transportation Needs: Not on file  Physical Activity: Not on file  Stress: Not on file  Social Connections: Not on file     Family History: The patient's family history includes Heart attack (age of onset: 26) in her father; Heart disease in her father; Heart disease (age of onset: 65) in her mother; Hypertension in her brother, daughter, mother, sister, and another family member; Lymphoma in her sister and another family member; Melanoma in her brother and another family member; Osteoarthritis in an other family member; Psoriasis in her mother; Rheum arthritis in her brother and mother.  ROS:   Please  see the history of present illness.     All other systems reviewed and are negative.  EKGs/Labs/Other Studies Reviewed:    The following studies were reviewed today: Cardiac Studies & Procedures  ECHOCARDIOGRAM  ECHOCARDIOGRAM COMPLETE 10/03/2021  Narrative ECHOCARDIOGRAM REPORT    Patient Name:   TANAI ANN Mcquary Date of Exam: 10/03/2021 Medical Rec #:  [id number]     Height:       60.0 in Accession #:    [id number]    Weight:       114.0 lb Date of Birth:  [birth year]    BSA:          1.470 m Patient Age:    64 years      BP:           134/72 mmHg Patient Gender: F             HR:           57 bpm. Exam Location:  [place]  Procedure: 2D Echo, Cardiac Doppler, Color Doppler and Strain Analysis  Indications:    Paroxysmal atrial fibrillation (HCC) [I48.0 (ICD-10-CM)]; Coronary artery disease of native artery of native heart with stable angina pectoris (HCC) [I25.118 (ICD-10-CM)]  History:        Patient has prior history of Echocardiogram examinations, most recent 10/04/2019. CAD, Arrythmias:Atrial Fibrillation and PVC, Signs/Symptoms:Hypotension; Risk Factors:Hypertension and Dyslipidemia.  Sonographer:    Margreta Journey RDCS Referring Phys: [id number] ROBERT J KRASOWSKI  IMPRESSIONS   1. Left ventricular ejection fraction, by estimation, is 60 to 65%. The left ventricle has normal function. The left ventricle has no regional wall motion abnormalities. There is mild concentric left ventricular hypertrophy. Left ventricular diastolic parameters are consistent with Grade I diastolic dysfunction (impaired relaxation). The average left ventricular global longitudinal strain is -18.4 %. The global longitudinal strain is normal. 2. Right ventricular systolic function is normal. The right ventricular size is normal. There is normal pulmonary artery systolic pressure. 3. The mitral valve is degenerative. No evidence of mitral valve regurgitation. No evidence of mitral stenosis. 4.  The aortic valve is tricuspid. Aortic valve regurgitation is not visualized. No aortic stenosis is present. 5. The inferior vena cava is normal in size with greater than 50% respiratory variability, suggesting right atrial pressure of 3 mmHg.  FINDINGS Left Ventricle: Left ventricular ejection fraction, by estimation, is 60 to 65%. The left ventricle has normal function. The left ventricle has no regional wall motion abnormalities. The average left ventricular global longitudinal strain is -18.4 %. The global longitudinal strain is normal. The left ventricular internal cavity size was normal in size. There is mild concentric left ventricular hypertrophy. Left ventricular diastolic parameters are consistent with Grade I diastolic dysfunction (impaired relaxation).  Right Ventricle: The right ventricular size is normal. No increase in right ventricular wall thickness. Right ventricular systolic function is normal. There is normal pulmonary artery systolic pressure. The tricuspid regurgitant velocity is 2.26 m/s, and with an assumed right atrial pressure of 3 mmHg, the estimated right ventricular systolic pressure is 23.4 mmHg.  Left Atrium: Left atrial size was normal in size.  Right Atrium: Right atrial size was normal in size.  Pericardium: There is no evidence of pericardial effusion.  Mitral Valve: The mitral valve is degenerative in appearance. Mild mitral annular calcification. No evidence of mitral valve regurgitation. No evidence of mitral valve stenosis.  Tricuspid Valve: The tricuspid valve is normal in structure. Tricuspid valve regurgitation is trivial. No evidence of tricuspid stenosis.  Aortic Valve: The aortic valve is tricuspid. Aortic valve regurgitation is not visualized. No aortic stenosis is present.  Pulmonic Valve: The pulmonic valve was normal in structure. Pulmonic valve regurgitation is not visualized.  No evidence of pulmonic stenosis.  Aorta: The aortic root and  ascending aorta are structurally normal, with no evidence of dilitation and the aortic arch was not well visualized.  Venous: The pulmonary veins were not well visualized. The inferior vena cava is normal in size with greater than 50% respiratory variability, suggesting right atrial pressure of 3 mmHg.  IAS/Shunts: No atrial level shunt detected by color flow Doppler.   LEFT VENTRICLE PLAX 2D LVIDd:         3.10 cm   Diastology LVIDs:         2.00 cm   LV e' medial:    6.31 cm/s LV PW:         1.10 cm   LV E/e' medial:  13.7 LV IVS:        1.10 cm   LV e' lateral:   11.70 cm/s LVOT diam:     1.90 cm   LV E/e' lateral: 7.4 LV SV:         51 LV SV Index:   35        2D Longitudinal Strain LVOT Area:     2.84 cm  2D Strain GLS Avg:     -18.4 %   RIGHT VENTRICLE             IVC RV Basal diam:  2.20 cm     IVC diam: 1.40 cm RV S prime:     13.20 cm/s TAPSE (M-mode): 2.8 cm  LEFT ATRIUM             Index        RIGHT ATRIUM          Index LA diam:        2.90 cm 1.97 cm/m   RA Area:     8.22 cm LA Vol (A2C):   43.0 ml 29.25 ml/m  RA Volume:   10.80 ml 7.35 ml/m LA Vol (A4C):   34.3 ml 23.33 ml/m LA Biplane Vol: 39.6 ml 26.94 ml/m AORTIC VALVE LVOT Vmax:   89.70 cm/s LVOT Vmean:  59.600 cm/s LVOT VTI:    0.181 m  AORTA Ao Root diam: 3.30 cm Ao Asc diam:  3.20 cm Ao Desc diam: 1.60 cm  MITRAL VALVE                TRICUSPID VALVE MV Area (PHT): 2.17 cm     TR Peak grad:   20.4 mmHg MV Decel Time: 349 msec     TR Vmax:        226.00 cm/s MR Peak grad: 84.8 mmHg MR Vmax:      460.50 cm/s   SHUNTS MV E velocity: 86.60 cm/s   Systemic VTI:  0.18 m MV A velocity: 122.00 cm/s  Systemic Diam: 1.90 cm MV E/A ratio:  0.71  Norman Herrlich MD Electronically signed by Norman Herrlich MD Signature Date/Time: 10/03/2021/12:48:52 PM    Final    MONITORS  LONG TERM MONITOR (3-14 DAYS) 10/13/2019  Narrative Kimberly Pickles Pennie, DOB [birth year], MRN [id number]  HOLTER MONITOR  REPORT:    Date of test:                 09/18/2019 Duration of test:           7 days Indication:                    Paroxysmal atrial fibrillation Ordering physician:  Georgeanna Lea, MD Referring physician:  Molly Maduro  Abelino Derrick, MD   Baseline rhythm: Sinus  Minimum heart rate: 46 BPM.  Average heart rate: 72 BPM.  Maximal heart rate 127 BPM.  Atrial arrhythmia: 4 episodes of narrow complex tachycardia recorded.  Fastest episodes was also the longest one lasting 8.9 seconds at rate of 162.  Ventricular arrhythmia: 2 runs of ventricular tachycardia car fastest episode 10 beats at rate of 171.  Fastest episode was also the longest 1 asymptomatic.  Conduction abnormality: None  Symptoms: None   Conclusion: Runs of supraventricular tachycardia as well as ventricular tachycardia with details as described above.  Interpreting  cardiologist: Gypsy Balsam, MD Date: 10/13/2019 10:48 AM            EKG:  EKG is  ordered today.  The ekg ordered today demonstrates NSR,  Recent Labs: No results found for requested labs within last 365 days.  Recent Lipid Panel No results found for: "CHOL", "TRIG", "HDL", "CHOLHDL", "VLDL", "LDLCALC", "LDLDIRECT"   Risk Assessment/Calculations:    CHA2DS2-VASc Score =     This indicates a  % annual risk of stroke. The patient's score is based upon:                 Physical Exam:    VS:  BP 120/70 (BP Location: Left Arm, Patient Position: Sitting, Cuff Size: Normal)   Pulse 65   Ht 5' (1.524 m)   Wt 114 lb (51.7 kg)   LMP  (LMP Unknown)   SpO2 97%   BMI 22.26 kg/m     Wt Readings from Last 3 Encounters:  06/29/22 114 lb (51.7 kg)  10/01/21 114 lb (51.7 kg)  03/27/21 114 lb (51.7 kg)     GEN:  Well nourished, well developed in no acute distress HEENT: Normal NECK: No JVD; No carotid bruits LYMPHATICS: No lymphadenopathy CARDIAC: RRR, no murmurs, rubs, gallops RESPIRATORY:  Clear to auscultation without rales, wheezing or  rhonchi  ABDOMEN: Soft, non-tender, non-distended MUSCULOSKELETAL:  No edema; No deformity  SKIN: Warm and dry NEUROLOGIC:  Alert and oriented x 3 PSYCHIATRIC:  Normal affect   ASSESSMENT:    1. Paroxysmal atrial fibrillation (HCC)   2. Coronary artery disease of native artery of native heart with stable angina pectoris (HCC)   3. Benign essential HTN   4. Mixed hyperlipidemia    PLAN:    In order of problems listed above:  PAF-CHA2DS2-VASc 4, currently anticoagulated with Xarelto 10 mg--she is on reduced dose secondary to brain lymphoma per her hematologist--discussed with Dr. Bing Matter ok to leave at this dose per hematology. Denies recurrence of palpitations, denies hematochezia, hematuria, hemoptysis.  CAD-s/p DES to mid RCA 2018, Stable with no anginal symptoms. No indication for ischemic evaluation.  Continue metoprolol 25 mg twice daily, continue atorvastatin 40 mg daily, continue NTG PRN--has not needed.  Hypertension- BP today is well controlled at 120/70, continue metoprolol 25 mg twice daily.  HLD-most recent LDL from my review was in 4132, well controlled at 28. Managed by PCP. Continue atorvastatin.   Disposition - return in 6 months.            Medication Adjustments/Labs and Tests Ordered: Current medicines are reviewed at length with the patient today.  Concerns regarding medicines are outlined above.  Orders Placed This Encounter  Procedures   EKG 12-Lead   No orders of the defined types were placed in this encounter.   Patient Instructions  Medication Instructions:  Your physician recommends that you continue on your current medications  as directed. Please refer to the Current Medication list given to you today.  *If you need a refill on your cardiac medications before your next appointment, please call your pharmacy*   Lab Work: None ordered If you have labs (blood work) drawn today and your tests are completely normal, you will receive your results  only by: MyChart Message (if you have MyChart) OR A paper copy in the mail If you have any lab test that is abnormal or we need to change your treatment, we will call you to review the results.   Testing/Procedures: None ordered   Follow-Up: At [facility], you and your health needs are our priority.  As part of our continuing mission to provide you with exceptional heart care, we have created designated Provider Care Teams.  These Care Teams include your primary Cardiologist (physician) and Advanced Practice Providers (APPs -  Physician Assistants and Nurse Practitioners) who all work together to provide you with the care you need, when you need it.  We recommend signing up for the patient portal called "MyChart".  Sign up information is provided on this After Visit Summary.  MyChart is used to connect with patients for Virtual Visits (Telemedicine).  Patients are able to view lab/test results, encounter notes, upcoming appointments, etc.  Non-urgent messages can be sent to your provider as well.   To learn more about what you can do with MyChart, go to [email or web address].    Your next appointment:   6 month(s)  The format for your next appointment:   In Person  Provider:   Gypsy Balsam, MD or Wallis Bamberg, NP [facility])    Other Instructions none  Important Information About Sugar        Signed, Flossie Dibble, NP  06/29/2022 12:38 PM    [facility]

## 2022-06-29 ENCOUNTER — Ambulatory Visit: Payer: Medicare PPO | Attending: Cardiology | Admitting: Cardiology

## 2022-06-29 ENCOUNTER — Encounter: Payer: Self-pay | Admitting: Cardiology

## 2022-06-29 VITALS — BP 120/70 | HR 65 | Ht 60.0 in | Wt 114.0 lb

## 2022-06-29 DIAGNOSIS — I480 Paroxysmal atrial fibrillation: Secondary | ICD-10-CM | POA: Diagnosis not present

## 2022-06-29 DIAGNOSIS — I25118 Atherosclerotic heart disease of native coronary artery with other forms of angina pectoris: Secondary | ICD-10-CM

## 2022-06-29 DIAGNOSIS — E782 Mixed hyperlipidemia: Secondary | ICD-10-CM | POA: Diagnosis not present

## 2022-06-29 DIAGNOSIS — I10 Essential (primary) hypertension: Secondary | ICD-10-CM

## 2022-06-29 NOTE — Patient Instructions (Addendum)
Medication Instructions:  Your physician recommends that you continue on your current medications as directed. Please refer to the Current Medication list given to you today.  *If you need a refill on your cardiac medications before your next appointment, please call your pharmacy*   Lab Work: None ordered If you have labs (blood work) drawn today and your tests are completely normal, you will receive your results only by: MyChart Message (if you have MyChart) OR A paper copy in the mail If you have any lab test that is abnormal or we need to change your treatment, we will call you to review the results.   Testing/Procedures: None ordered   Follow-Up: At [facility], you and your health needs are our priority.  As part of our continuing mission to provide you with exceptional heart care, we have created designated Provider Care Teams.  These Care Teams include your primary Cardiologist (physician) and Advanced Practice Providers (APPs -  Physician Assistants and Nurse Practitioners) who all work together to provide you with the care you need, when you need it.  We recommend signing up for the patient portal called "MyChart".  Sign up information is provided on this After Visit Summary.  MyChart is used to connect with patients for Virtual Visits (Telemedicine).  Patients are able to view lab/test results, encounter notes, upcoming appointments, etc.  Non-urgent messages can be sent to your provider as well.   To learn more about what you can do with MyChart, go to [email or web address].    Your next appointment:   6 month(s)  The format for your next appointment:   In Person  Provider:   Gypsy Balsam, MD or Wallis Bamberg, NP [facility])    Other Instructions none  Important Information About Sugar

## 2022-07-22 ENCOUNTER — Other Ambulatory Visit: Payer: Self-pay | Admitting: Cardiology

## 2022-07-23 DIAGNOSIS — C8589 Other specified types of non-Hodgkin lymphoma, extranodal and solid organ sites: Secondary | ICD-10-CM | POA: Diagnosis not present

## 2022-08-04 DIAGNOSIS — L720 Epidermal cyst: Secondary | ICD-10-CM | POA: Diagnosis not present

## 2022-08-04 DIAGNOSIS — L821 Other seborrheic keratosis: Secondary | ICD-10-CM | POA: Diagnosis not present

## 2022-08-04 DIAGNOSIS — L565 Disseminated superficial actinic porokeratosis (DSAP): Secondary | ICD-10-CM | POA: Diagnosis not present

## 2022-08-04 DIAGNOSIS — D692 Other nonthrombocytopenic purpura: Secondary | ICD-10-CM | POA: Diagnosis not present

## 2022-08-04 DIAGNOSIS — C44729 Squamous cell carcinoma of skin of left lower limb, including hip: Secondary | ICD-10-CM | POA: Diagnosis not present

## 2022-08-04 DIAGNOSIS — D485 Neoplasm of uncertain behavior of skin: Secondary | ICD-10-CM | POA: Diagnosis not present

## 2022-08-24 DIAGNOSIS — C8589 Other specified types of non-Hodgkin lymphoma, extranodal and solid organ sites: Secondary | ICD-10-CM | POA: Diagnosis not present

## 2022-08-24 DIAGNOSIS — G9389 Other specified disorders of brain: Secondary | ICD-10-CM | POA: Diagnosis not present

## 2022-08-27 DIAGNOSIS — I2510 Atherosclerotic heart disease of native coronary artery without angina pectoris: Secondary | ICD-10-CM | POA: Diagnosis not present

## 2022-08-27 DIAGNOSIS — R278 Other lack of coordination: Secondary | ICD-10-CM | POA: Diagnosis not present

## 2022-08-27 DIAGNOSIS — Z7901 Long term (current) use of anticoagulants: Secondary | ICD-10-CM | POA: Diagnosis not present

## 2022-08-27 DIAGNOSIS — D649 Anemia, unspecified: Secondary | ICD-10-CM | POA: Diagnosis not present

## 2022-08-27 DIAGNOSIS — H02402 Unspecified ptosis of left eyelid: Secondary | ICD-10-CM | POA: Diagnosis not present

## 2022-08-27 DIAGNOSIS — C8589 Other specified types of non-Hodgkin lymphoma, extranodal and solid organ sites: Secondary | ICD-10-CM | POA: Diagnosis not present

## 2022-08-27 DIAGNOSIS — I459 Conduction disorder, unspecified: Secondary | ICD-10-CM | POA: Diagnosis not present

## 2022-08-27 DIAGNOSIS — I82622 Acute embolism and thrombosis of deep veins of left upper extremity: Secondary | ICD-10-CM | POA: Diagnosis not present

## 2022-08-27 DIAGNOSIS — G510 Bell's palsy: Secondary | ICD-10-CM | POA: Diagnosis not present

## 2022-08-27 DIAGNOSIS — Z5111 Encounter for antineoplastic chemotherapy: Secondary | ICD-10-CM | POA: Diagnosis not present

## 2022-09-02 DIAGNOSIS — C8589 Other specified types of non-Hodgkin lymphoma, extranodal and solid organ sites: Secondary | ICD-10-CM | POA: Diagnosis not present

## 2022-09-09 DIAGNOSIS — C8589 Other specified types of non-Hodgkin lymphoma, extranodal and solid organ sites: Secondary | ICD-10-CM | POA: Diagnosis not present

## 2022-10-05 DIAGNOSIS — C8589 Other specified types of non-Hodgkin lymphoma, extranodal and solid organ sites: Secondary | ICD-10-CM | POA: Diagnosis not present

## 2022-10-06 DIAGNOSIS — C8589 Other specified types of non-Hodgkin lymphoma, extranodal and solid organ sites: Secondary | ICD-10-CM | POA: Diagnosis not present

## 2022-10-06 DIAGNOSIS — D6481 Anemia due to antineoplastic chemotherapy: Secondary | ICD-10-CM | POA: Diagnosis not present

## 2022-10-06 DIAGNOSIS — T451X5A Adverse effect of antineoplastic and immunosuppressive drugs, initial encounter: Secondary | ICD-10-CM | POA: Diagnosis not present

## 2022-10-06 DIAGNOSIS — D649 Anemia, unspecified: Secondary | ICD-10-CM | POA: Diagnosis not present

## 2022-10-07 DIAGNOSIS — D6481 Anemia due to antineoplastic chemotherapy: Secondary | ICD-10-CM | POA: Diagnosis not present

## 2022-10-07 DIAGNOSIS — C8589 Other specified types of non-Hodgkin lymphoma, extranodal and solid organ sites: Secondary | ICD-10-CM | POA: Diagnosis not present

## 2022-10-07 DIAGNOSIS — T451X5A Adverse effect of antineoplastic and immunosuppressive drugs, initial encounter: Secondary | ICD-10-CM | POA: Diagnosis not present

## 2022-10-07 DIAGNOSIS — D649 Anemia, unspecified: Secondary | ICD-10-CM | POA: Diagnosis not present

## 2022-10-12 DIAGNOSIS — C8589 Other specified types of non-Hodgkin lymphoma, extranodal and solid organ sites: Secondary | ICD-10-CM | POA: Diagnosis not present

## 2022-10-12 DIAGNOSIS — D508 Other iron deficiency anemias: Secondary | ICD-10-CM | POA: Diagnosis not present

## 2022-10-12 DIAGNOSIS — D6481 Anemia due to antineoplastic chemotherapy: Secondary | ICD-10-CM | POA: Diagnosis not present

## 2022-10-12 DIAGNOSIS — D509 Iron deficiency anemia, unspecified: Secondary | ICD-10-CM | POA: Insufficient documentation

## 2022-10-12 DIAGNOSIS — T451X5A Adverse effect of antineoplastic and immunosuppressive drugs, initial encounter: Secondary | ICD-10-CM | POA: Diagnosis not present

## 2022-10-26 DIAGNOSIS — T451X5A Adverse effect of antineoplastic and immunosuppressive drugs, initial encounter: Secondary | ICD-10-CM | POA: Diagnosis not present

## 2022-10-26 DIAGNOSIS — D649 Anemia, unspecified: Secondary | ICD-10-CM | POA: Diagnosis not present

## 2022-10-26 DIAGNOSIS — C8589 Other specified types of non-Hodgkin lymphoma, extranodal and solid organ sites: Secondary | ICD-10-CM | POA: Diagnosis not present

## 2022-10-26 DIAGNOSIS — D6481 Anemia due to antineoplastic chemotherapy: Secondary | ICD-10-CM | POA: Diagnosis not present

## 2022-11-05 DIAGNOSIS — G9389 Other specified disorders of brain: Secondary | ICD-10-CM | POA: Diagnosis not present

## 2022-11-05 DIAGNOSIS — C8589 Other specified types of non-Hodgkin lymphoma, extranodal and solid organ sites: Secondary | ICD-10-CM | POA: Diagnosis not present

## 2022-11-05 DIAGNOSIS — C8590 Non-Hodgkin lymphoma, unspecified, unspecified site: Secondary | ICD-10-CM | POA: Diagnosis not present

## 2022-11-12 DIAGNOSIS — D6481 Anemia due to antineoplastic chemotherapy: Secondary | ICD-10-CM | POA: Diagnosis not present

## 2022-11-12 DIAGNOSIS — C8589 Other specified types of non-Hodgkin lymphoma, extranodal and solid organ sites: Secondary | ICD-10-CM | POA: Diagnosis not present

## 2022-11-12 DIAGNOSIS — T451X5D Adverse effect of antineoplastic and immunosuppressive drugs, subsequent encounter: Secondary | ICD-10-CM | POA: Diagnosis not present

## 2022-11-12 DIAGNOSIS — T451X5A Adverse effect of antineoplastic and immunosuppressive drugs, initial encounter: Secondary | ICD-10-CM | POA: Diagnosis not present

## 2022-11-13 ENCOUNTER — Other Ambulatory Visit: Payer: Self-pay | Admitting: Cardiology

## 2022-11-20 DIAGNOSIS — D6481 Anemia due to antineoplastic chemotherapy: Secondary | ICD-10-CM | POA: Diagnosis not present

## 2022-11-20 DIAGNOSIS — T451X5A Adverse effect of antineoplastic and immunosuppressive drugs, initial encounter: Secondary | ICD-10-CM | POA: Diagnosis not present

## 2022-11-20 DIAGNOSIS — T451X5D Adverse effect of antineoplastic and immunosuppressive drugs, subsequent encounter: Secondary | ICD-10-CM | POA: Diagnosis not present

## 2022-11-20 DIAGNOSIS — C8589 Other specified types of non-Hodgkin lymphoma, extranodal and solid organ sites: Secondary | ICD-10-CM | POA: Diagnosis not present

## 2022-11-26 DIAGNOSIS — C8589 Other specified types of non-Hodgkin lymphoma, extranodal and solid organ sites: Secondary | ICD-10-CM | POA: Diagnosis not present

## 2022-11-26 DIAGNOSIS — T451X5A Adverse effect of antineoplastic and immunosuppressive drugs, initial encounter: Secondary | ICD-10-CM | POA: Diagnosis not present

## 2022-11-26 DIAGNOSIS — D6481 Anemia due to antineoplastic chemotherapy: Secondary | ICD-10-CM | POA: Diagnosis not present

## 2022-11-26 DIAGNOSIS — T451X5D Adverse effect of antineoplastic and immunosuppressive drugs, subsequent encounter: Secondary | ICD-10-CM | POA: Diagnosis not present

## 2022-12-03 DIAGNOSIS — C8339 Primary central nervous system lymphoma: Secondary | ICD-10-CM | POA: Diagnosis not present

## 2022-12-03 DIAGNOSIS — D6481 Anemia due to antineoplastic chemotherapy: Secondary | ICD-10-CM | POA: Diagnosis not present

## 2022-12-03 DIAGNOSIS — T451X5D Adverse effect of antineoplastic and immunosuppressive drugs, subsequent encounter: Secondary | ICD-10-CM | POA: Diagnosis not present

## 2022-12-03 DIAGNOSIS — T451X5A Adverse effect of antineoplastic and immunosuppressive drugs, initial encounter: Secondary | ICD-10-CM | POA: Diagnosis not present

## 2022-12-08 DIAGNOSIS — H401111 Primary open-angle glaucoma, right eye, mild stage: Secondary | ICD-10-CM | POA: Diagnosis not present

## 2022-12-08 DIAGNOSIS — Z961 Presence of intraocular lens: Secondary | ICD-10-CM | POA: Diagnosis not present

## 2022-12-08 DIAGNOSIS — H35441 Age-related reticular degeneration of retina, right eye: Secondary | ICD-10-CM | POA: Diagnosis not present

## 2022-12-08 DIAGNOSIS — H43391 Other vitreous opacities, right eye: Secondary | ICD-10-CM | POA: Diagnosis not present

## 2022-12-08 DIAGNOSIS — H16221 Keratoconjunctivitis sicca, not specified as Sjogren's, right eye: Secondary | ICD-10-CM | POA: Diagnosis not present

## 2022-12-10 DIAGNOSIS — T451X5D Adverse effect of antineoplastic and immunosuppressive drugs, subsequent encounter: Secondary | ICD-10-CM | POA: Diagnosis not present

## 2022-12-10 DIAGNOSIS — D6481 Anemia due to antineoplastic chemotherapy: Secondary | ICD-10-CM | POA: Diagnosis not present

## 2022-12-10 DIAGNOSIS — C8339 Primary central nervous system lymphoma: Secondary | ICD-10-CM | POA: Diagnosis not present

## 2022-12-10 DIAGNOSIS — T451X5A Adverse effect of antineoplastic and immunosuppressive drugs, initial encounter: Secondary | ICD-10-CM | POA: Diagnosis not present

## 2022-12-21 ENCOUNTER — Encounter: Payer: Self-pay | Admitting: *Deleted

## 2022-12-21 NOTE — Progress Notes (Signed)
aortic arch was not well  visualized.  Venous: The pulmonary veins were not well visualized. The inferior vena cava is normal in size with greater than 50% respiratory variability, suggesting right atrial pressure of 3 mmHg.  IAS/Shunts: No atrial level shunt detected by color flow Doppler.   LEFT VENTRICLE PLAX 2D LVIDd:         3.10 cm   Diastology LVIDs:         2.00 cm   LV e' medial:    6.31 cm/s LV PW:         1.10 cm   LV E/e' medial:  13.7 LV IVS:        1.10 cm   LV e' lateral:   11.70 cm/s LVOT diam:     1.90 cm   LV E/e' lateral: 7.4 LV SV:         51 LV SV Index:   35        2D Longitudinal Strain LVOT Area:     2.84 cm  2D Strain GLS Avg:     -18.4 %   RIGHT VENTRICLE             IVC RV Basal diam:  2.20 cm     IVC diam: 1.40 cm RV S prime:     13.20 cm/s TAPSE (M-mode): 2.8 cm  LEFT ATRIUM             Index        RIGHT ATRIUM          Index LA diam:        2.90 cm 1.97 cm/m   RA Area:     8.22 cm LA Vol (A2C):   43.0 ml 29.25 ml/m  RA Volume:   10.80 ml 7.35 ml/m LA Vol (A4C):   34.3 ml 23.33 ml/m LA Biplane Vol: 39.6 ml 26.94 ml/m AORTIC VALVE LVOT Vmax:   89.70 cm/s LVOT Vmean:  59.600 cm/s LVOT VTI:    0.181 m  AORTA Ao Root diam: 3.30 cm Ao Asc diam:  3.20 cm Ao Desc diam: 1.60 cm  MITRAL VALVE                TRICUSPID VALVE MV Area (PHT): 2.17 cm     TR Peak grad:   20.4 mmHg MV Decel Time: 349 msec     TR Vmax:        226.00 cm/s MR Peak grad: 84.8 mmHg MR Vmax:      460.50 cm/s   SHUNTS MV E velocity: 86.60 cm/s   Systemic VTI:  0.18 m MV A velocity: 122.00 cm/s  Systemic Diam: 1.90 cm MV E/A ratio:  0.71  Norman Herrlich MD Electronically signed by Norman Herrlich MD Signature Date/Time: 10/03/2021/12:48:52 PM    Final    MONITORS  LONG TERM MONITOR (3-14 DAYS) 10/12/2019  Narrative Kimberly Vaughn, DOB 1945-11-03, MRN [id number]  [facility] MONITOR REPORT:    Date of test:                 09/18/2019 Duration of test:           7 days Indication:                     Paroxysmal atrial fibrillation Ordering physician:  Georgeanna Lea, MD Referring physician:  Georgeanna Lea, MD   Baseline rhythm: Sinus  Minimum heart rate: 46 BPM.  Average heart rate: 72 BPM.  Maximal heart  aortic arch was not well  visualized.  Venous: The pulmonary veins were not well visualized. The inferior vena cava is normal in size with greater than 50% respiratory variability, suggesting right atrial pressure of 3 mmHg.  IAS/Shunts: No atrial level shunt detected by color flow Doppler.   LEFT VENTRICLE PLAX 2D LVIDd:         3.10 cm   Diastology LVIDs:         2.00 cm   LV e' medial:    6.31 cm/s LV PW:         1.10 cm   LV E/e' medial:  13.7 LV IVS:        1.10 cm   LV e' lateral:   11.70 cm/s LVOT diam:     1.90 cm   LV E/e' lateral: 7.4 LV SV:         51 LV SV Index:   35        2D Longitudinal Strain LVOT Area:     2.84 cm  2D Strain GLS Avg:     -18.4 %   RIGHT VENTRICLE             IVC RV Basal diam:  2.20 cm     IVC diam: 1.40 cm RV S prime:     13.20 cm/s TAPSE (M-mode): 2.8 cm  LEFT ATRIUM             Index        RIGHT ATRIUM          Index LA diam:        2.90 cm 1.97 cm/m   RA Area:     8.22 cm LA Vol (A2C):   43.0 ml 29.25 ml/m  RA Volume:   10.80 ml 7.35 ml/m LA Vol (A4C):   34.3 ml 23.33 ml/m LA Biplane Vol: 39.6 ml 26.94 ml/m AORTIC VALVE LVOT Vmax:   89.70 cm/s LVOT Vmean:  59.600 cm/s LVOT VTI:    0.181 m  AORTA Ao Root diam: 3.30 cm Ao Asc diam:  3.20 cm Ao Desc diam: 1.60 cm  MITRAL VALVE                TRICUSPID VALVE MV Area (PHT): 2.17 cm     TR Peak grad:   20.4 mmHg MV Decel Time: 349 msec     TR Vmax:        226.00 cm/s MR Peak grad: 84.8 mmHg MR Vmax:      460.50 cm/s   SHUNTS MV E velocity: 86.60 cm/s   Systemic VTI:  0.18 m MV A velocity: 122.00 cm/s  Systemic Diam: 1.90 cm MV E/A ratio:  0.71  Norman Herrlich MD Electronically signed by Norman Herrlich MD Signature Date/Time: 10/03/2021/12:48:52 PM    Final    MONITORS  LONG TERM MONITOR (3-14 DAYS) 10/12/2019  Narrative Kimberly Vaughn, DOB 1945-11-03, MRN [id number]  [facility] MONITOR REPORT:    Date of test:                 09/18/2019 Duration of test:           7 days Indication:                     Paroxysmal atrial fibrillation Ordering physician:  Georgeanna Lea, MD Referring physician:  Georgeanna Lea, MD   Baseline rhythm: Sinus  Minimum heart rate: 46 BPM.  Average heart rate: 72 BPM.  Maximal heart  aortic arch was not well  visualized.  Venous: The pulmonary veins were not well visualized. The inferior vena cava is normal in size with greater than 50% respiratory variability, suggesting right atrial pressure of 3 mmHg.  IAS/Shunts: No atrial level shunt detected by color flow Doppler.   LEFT VENTRICLE PLAX 2D LVIDd:         3.10 cm   Diastology LVIDs:         2.00 cm   LV e' medial:    6.31 cm/s LV PW:         1.10 cm   LV E/e' medial:  13.7 LV IVS:        1.10 cm   LV e' lateral:   11.70 cm/s LVOT diam:     1.90 cm   LV E/e' lateral: 7.4 LV SV:         51 LV SV Index:   35        2D Longitudinal Strain LVOT Area:     2.84 cm  2D Strain GLS Avg:     -18.4 %   RIGHT VENTRICLE             IVC RV Basal diam:  2.20 cm     IVC diam: 1.40 cm RV S prime:     13.20 cm/s TAPSE (M-mode): 2.8 cm  LEFT ATRIUM             Index        RIGHT ATRIUM          Index LA diam:        2.90 cm 1.97 cm/m   RA Area:     8.22 cm LA Vol (A2C):   43.0 ml 29.25 ml/m  RA Volume:   10.80 ml 7.35 ml/m LA Vol (A4C):   34.3 ml 23.33 ml/m LA Biplane Vol: 39.6 ml 26.94 ml/m AORTIC VALVE LVOT Vmax:   89.70 cm/s LVOT Vmean:  59.600 cm/s LVOT VTI:    0.181 m  AORTA Ao Root diam: 3.30 cm Ao Asc diam:  3.20 cm Ao Desc diam: 1.60 cm  MITRAL VALVE                TRICUSPID VALVE MV Area (PHT): 2.17 cm     TR Peak grad:   20.4 mmHg MV Decel Time: 349 msec     TR Vmax:        226.00 cm/s MR Peak grad: 84.8 mmHg MR Vmax:      460.50 cm/s   SHUNTS MV E velocity: 86.60 cm/s   Systemic VTI:  0.18 m MV A velocity: 122.00 cm/s  Systemic Diam: 1.90 cm MV E/A ratio:  0.71  Norman Herrlich MD Electronically signed by Norman Herrlich MD Signature Date/Time: 10/03/2021/12:48:52 PM    Final    MONITORS  LONG TERM MONITOR (3-14 DAYS) 10/12/2019  Narrative Kimberly Vaughn, DOB 1945-11-03, MRN [id number]  [facility] MONITOR REPORT:    Date of test:                 09/18/2019 Duration of test:           7 days Indication:                     Paroxysmal atrial fibrillation Ordering physician:  Georgeanna Lea, MD Referring physician:  Georgeanna Lea, MD   Baseline rhythm: Sinus  Minimum heart rate: 46 BPM.  Average heart rate: 72 BPM.  Maximal heart  Medical Rec #:  [id number]     Height:       60.0 in Accession #:    [id number]    Weight:       114.0 lb Date of Birth:  [birth year]    BSA:          1.470 m Patient Age:    77 years      BP:           134/72 mmHg Patient Gender: F             HR:           57 bpm. Exam Location:  [facility]  Procedure: 2D Echo, Cardiac Doppler, Color Doppler and Strain Analysis  Indications:    Paroxysmal atrial fibrillation (HCC) [I48.0 (ICD-10-CM)]; Coronary artery disease of native artery of native heart with stable angina pectoris (HCC) [I25.118 (ICD-10-CM)]  History:        Patient has prior history of Echocardiogram examinations, most recent 10/04/2019. CAD, Arrythmias:Atrial Fibrillation and PVC, Signs/Symptoms:Hypotension; Risk Factors:Hypertension and Dyslipidemia.  Sonographer:    Margreta Journey RDCS Referring Phys: [id number] ROBERT J KRASOWSKI  IMPRESSIONS   1. Left ventricular ejection fraction, by estimation, is 60 to 65%. The left ventricle has normal function. The left ventricle has no regional wall motion abnormalities. There is mild concentric left ventricular hypertrophy. Left ventricular diastolic parameters are consistent with Grade I diastolic dysfunction (impaired relaxation). The average left ventricular global longitudinal strain is -18.4 %. The global longitudinal strain is normal. 2. Right ventricular systolic function is normal. The right ventricular size is normal. There is normal pulmonary artery systolic pressure. 3. The mitral valve is degenerative. No evidence of mitral valve regurgitation. No evidence of mitral stenosis. 4. The aortic valve is tricuspid. Aortic valve regurgitation is not visualized. No aortic stenosis is present. 5.  The inferior vena cava is normal in size with greater than 50% respiratory variability, suggesting right atrial pressure of 3 mmHg.  FINDINGS Left Ventricle: Left ventricular ejection fraction, by estimation, is 60 to 65%. The left ventricle has normal function. The left ventricle has no regional wall motion abnormalities. The average left ventricular global longitudinal strain is -18.4 %. The global longitudinal strain is normal. The left ventricular internal cavity size was normal in size. There is mild concentric left ventricular hypertrophy. Left ventricular diastolic parameters are consistent with Grade I diastolic dysfunction (impaired relaxation).  Right Ventricle: The right ventricular size is normal. No increase in right ventricular wall thickness. Right ventricular systolic function is normal. There is normal pulmonary artery systolic pressure. The tricuspid regurgitant velocity is 2.26 m/s, and with an assumed right atrial pressure of 3 mmHg, the estimated right ventricular systolic pressure is 23.4 mmHg.  Left Atrium: Left atrial size was normal in size.  Right Atrium: Right atrial size was normal in size.  Pericardium: There is no evidence of pericardial effusion.  Mitral Valve: The mitral valve is degenerative in appearance. Mild mitral annular calcification. No evidence of mitral valve regurgitation. No evidence of mitral valve stenosis.  Tricuspid Valve: The tricuspid valve is normal in structure. Tricuspid valve regurgitation is trivial. No evidence of tricuspid stenosis.  Aortic Valve: The aortic valve is tricuspid. Aortic valve regurgitation is not visualized. No aortic stenosis is present.  Pulmonic Valve: The pulmonic valve was normal in structure. Pulmonic valve regurgitation is not visualized. No evidence of pulmonic stenosis.  Aorta: The aortic root and ascending aorta are structurally normal, with no evidence of dilitation and the  Medical Rec #:  [id number]     Height:       60.0 in Accession #:    [id number]    Weight:       114.0 lb Date of Birth:  [birth year]    BSA:          1.470 m Patient Age:    77 years      BP:           134/72 mmHg Patient Gender: F             HR:           57 bpm. Exam Location:  [facility]  Procedure: 2D Echo, Cardiac Doppler, Color Doppler and Strain Analysis  Indications:    Paroxysmal atrial fibrillation (HCC) [I48.0 (ICD-10-CM)]; Coronary artery disease of native artery of native heart with stable angina pectoris (HCC) [I25.118 (ICD-10-CM)]  History:        Patient has prior history of Echocardiogram examinations, most recent 10/04/2019. CAD, Arrythmias:Atrial Fibrillation and PVC, Signs/Symptoms:Hypotension; Risk Factors:Hypertension and Dyslipidemia.  Sonographer:    Margreta Journey RDCS Referring Phys: [id number] ROBERT J KRASOWSKI  IMPRESSIONS   1. Left ventricular ejection fraction, by estimation, is 60 to 65%. The left ventricle has normal function. The left ventricle has no regional wall motion abnormalities. There is mild concentric left ventricular hypertrophy. Left ventricular diastolic parameters are consistent with Grade I diastolic dysfunction (impaired relaxation). The average left ventricular global longitudinal strain is -18.4 %. The global longitudinal strain is normal. 2. Right ventricular systolic function is normal. The right ventricular size is normal. There is normal pulmonary artery systolic pressure. 3. The mitral valve is degenerative. No evidence of mitral valve regurgitation. No evidence of mitral stenosis. 4. The aortic valve is tricuspid. Aortic valve regurgitation is not visualized. No aortic stenosis is present. 5.  The inferior vena cava is normal in size with greater than 50% respiratory variability, suggesting right atrial pressure of 3 mmHg.  FINDINGS Left Ventricle: Left ventricular ejection fraction, by estimation, is 60 to 65%. The left ventricle has normal function. The left ventricle has no regional wall motion abnormalities. The average left ventricular global longitudinal strain is -18.4 %. The global longitudinal strain is normal. The left ventricular internal cavity size was normal in size. There is mild concentric left ventricular hypertrophy. Left ventricular diastolic parameters are consistent with Grade I diastolic dysfunction (impaired relaxation).  Right Ventricle: The right ventricular size is normal. No increase in right ventricular wall thickness. Right ventricular systolic function is normal. There is normal pulmonary artery systolic pressure. The tricuspid regurgitant velocity is 2.26 m/s, and with an assumed right atrial pressure of 3 mmHg, the estimated right ventricular systolic pressure is 23.4 mmHg.  Left Atrium: Left atrial size was normal in size.  Right Atrium: Right atrial size was normal in size.  Pericardium: There is no evidence of pericardial effusion.  Mitral Valve: The mitral valve is degenerative in appearance. Mild mitral annular calcification. No evidence of mitral valve regurgitation. No evidence of mitral valve stenosis.  Tricuspid Valve: The tricuspid valve is normal in structure. Tricuspid valve regurgitation is trivial. No evidence of tricuspid stenosis.  Aortic Valve: The aortic valve is tricuspid. Aortic valve regurgitation is not visualized. No aortic stenosis is present.  Pulmonic Valve: The pulmonic valve was normal in structure. Pulmonic valve regurgitation is not visualized. No evidence of pulmonic stenosis.  Aorta: The aortic root and ascending aorta are structurally normal, with no evidence of dilitation and the  Cardiology Office Note:    Date:  12/22/2022   ID:  Fredna Winnie Evergreen, DOB 1946/02/17, MRN [id number]  PCP:  Philemon Kingdom, MD   [facility] Providers Cardiologist:  Gypsy Balsam, MD     Referring MD: Philemon Kingdom, MD   CC: follow up atrial fibrillation  History of Present Illness:    Kimberly Vaughn is a 77 y.o. female with a hx of CAD s/p DES to mid RCA 2018, paroxysmal atrial fibrillation, DVT, pulmonary nodule, GERD, BPPV, cranial nerve palsy, history of brain tumor, diffuse large B-cell lymphoma, dyslipidemia.  10/03/2021 echo EF 60 to 65%, mild concentric LVH, grade 1 DD 09/18/2019 monitor average heart rate 72 bpm, 2 runs of NSVT, 4 episodes of SVT  She established care initially with Dr. Elberta Fortis in 2017 for atrial fibrillation and dizziness.  She had been diagnosed with atrial fibrillation in 2016 while undergoing a stress test.  She had previously not been on anticoagulation however her CHA2DS2-VASc or was 3 so she was started on Eliquis.  Recently evaluated in April of this year for follow-up of her CAD, she was doing well from a cardiac perspective, brain lymphoma was in remission, advise she can follow-up in 6 months.  She presents today for follow-up of her atrial fibrillation, offers no formal complaints from a cardiac perspective, her brain lymphoma is still in remission.  She has some anemia however this has been secondary to her chemotherapy and it is checked monthly with steady increase.  She denies hematochezia, hematuria, hemoptysis. She denies chest pain, palpitations, dyspnea, pnd, orthopnea, n, v, dizziness, syncope, edema, weight gain, or early satiety.    Past Medical History:  Diagnosis Date   A-fib (HCC)    Acute blood loss anemia 03/16/2017   Acute cystitis without hematuria 06/06/2017   Acute deep vein thrombosis (DVT) of left upper extremity (HCC) 11/19/2016   unspecified vein, associated with PICC line   Acute megaloblastic anemia due to  severe illness 11/12/2016   Anemia associated with chemotherapy 11/26/2016   Angina pectoris (HCC) 03/09/2016   Anginal pain (HCC)    Asthma    as a child   Asymmetric SNHL (sensorineural hearing loss) 04/15/2016   Ataxia 03/16/2017   Bacteremia due to methicillin susceptible Staphylococcus aureus (MSSA) 11/12/2016   Benign essential HTN    Bilateral impacted cerumen 01/05/2017   Bilateral lower extremity edema 11/12/2016   BPPV (benign paroxysmal positional vertigo) 12/25/2015   Brain mass 09/09/2016   Brain tumor (HCC) 09/04/2016   Corneal abrasion, left, sequela 12/03/2016   Coronary artery disease    Coronary artery disease involving native coronary artery of native heart 03/30/2016   Formatting of this note might be different from the original. PTCA and drug-eluting stents to right coronary artery in January 2018 Formatting of this note might be different from the original. Overview:  PTCA and drug-eluting stents to right coronary artery in January 2018 Formatting of this note might be different from the original. Overview:  Drug-eluting stents to right coronary artery January   Cranial nerve VI palsy, left 11/26/2016   Cranial nerve VII palsy 11/26/2016   Formatting of this note might be different from the original. Added automatically from request for surgery [phone]   DDD (degenerative disc disease), lumbar    Debility    Degenerative joint disease    Diarrhea 03/16/2017   Diffuse large B cell lymphoma (HCC) 09/16/2016   Dizziness and giddiness    DSAP (disseminated superficial actinic porokeratosis) 11/14/2016  aortic arch was not well  visualized.  Venous: The pulmonary veins were not well visualized. The inferior vena cava is normal in size with greater than 50% respiratory variability, suggesting right atrial pressure of 3 mmHg.  IAS/Shunts: No atrial level shunt detected by color flow Doppler.   LEFT VENTRICLE PLAX 2D LVIDd:         3.10 cm   Diastology LVIDs:         2.00 cm   LV e' medial:    6.31 cm/s LV PW:         1.10 cm   LV E/e' medial:  13.7 LV IVS:        1.10 cm   LV e' lateral:   11.70 cm/s LVOT diam:     1.90 cm   LV E/e' lateral: 7.4 LV SV:         51 LV SV Index:   35        2D Longitudinal Strain LVOT Area:     2.84 cm  2D Strain GLS Avg:     -18.4 %   RIGHT VENTRICLE             IVC RV Basal diam:  2.20 cm     IVC diam: 1.40 cm RV S prime:     13.20 cm/s TAPSE (M-mode): 2.8 cm  LEFT ATRIUM             Index        RIGHT ATRIUM          Index LA diam:        2.90 cm 1.97 cm/m   RA Area:     8.22 cm LA Vol (A2C):   43.0 ml 29.25 ml/m  RA Volume:   10.80 ml 7.35 ml/m LA Vol (A4C):   34.3 ml 23.33 ml/m LA Biplane Vol: 39.6 ml 26.94 ml/m AORTIC VALVE LVOT Vmax:   89.70 cm/s LVOT Vmean:  59.600 cm/s LVOT VTI:    0.181 m  AORTA Ao Root diam: 3.30 cm Ao Asc diam:  3.20 cm Ao Desc diam: 1.60 cm  MITRAL VALVE                TRICUSPID VALVE MV Area (PHT): 2.17 cm     TR Peak grad:   20.4 mmHg MV Decel Time: 349 msec     TR Vmax:        226.00 cm/s MR Peak grad: 84.8 mmHg MR Vmax:      460.50 cm/s   SHUNTS MV E velocity: 86.60 cm/s   Systemic VTI:  0.18 m MV A velocity: 122.00 cm/s  Systemic Diam: 1.90 cm MV E/A ratio:  0.71  Norman Herrlich MD Electronically signed by Norman Herrlich MD Signature Date/Time: 10/03/2021/12:48:52 PM    Final    MONITORS  LONG TERM MONITOR (3-14 DAYS) 10/12/2019  Narrative Kimberly Vaughn, DOB 1945-11-03, MRN [id number]  [facility] MONITOR REPORT:    Date of test:                 09/18/2019 Duration of test:           7 days Indication:                     Paroxysmal atrial fibrillation Ordering physician:  Georgeanna Lea, MD Referring physician:  Georgeanna Lea, MD   Baseline rhythm: Sinus  Minimum heart rate: 46 BPM.  Average heart rate: 72 BPM.  Maximal heart

## 2022-12-22 ENCOUNTER — Encounter: Payer: Self-pay | Admitting: Cardiology

## 2022-12-22 ENCOUNTER — Ambulatory Visit: Payer: Medicare PPO | Attending: Cardiology | Admitting: Cardiology

## 2022-12-22 VITALS — BP 123/71 | HR 60 | Ht 60.0 in | Wt 116.0 lb

## 2022-12-22 DIAGNOSIS — I480 Paroxysmal atrial fibrillation: Secondary | ICD-10-CM | POA: Diagnosis not present

## 2022-12-22 DIAGNOSIS — I10 Essential (primary) hypertension: Secondary | ICD-10-CM | POA: Diagnosis not present

## 2022-12-22 DIAGNOSIS — I471 Supraventricular tachycardia, unspecified: Secondary | ICD-10-CM | POA: Diagnosis not present

## 2022-12-22 DIAGNOSIS — E782 Mixed hyperlipidemia: Secondary | ICD-10-CM | POA: Diagnosis not present

## 2022-12-22 DIAGNOSIS — D6859 Other primary thrombophilia: Secondary | ICD-10-CM | POA: Diagnosis not present

## 2022-12-22 DIAGNOSIS — I25118 Atherosclerotic heart disease of native coronary artery with other forms of angina pectoris: Secondary | ICD-10-CM

## 2022-12-22 NOTE — Patient Instructions (Signed)
Medication Instructions:  Your physician recommends that you continue on your current medications as directed. Please refer to the Current Medication list given to you today.  *If you need a refill on your cardiac medications before your next appointment, please call your pharmacy*   Lab Work: Your physician recommends that you return for lab work in: Today for Fasting Lipid Panel and Liver Function  If you have labs (blood work) drawn today and your tests are completely normal, you will receive your results only by: MyChart Message (if you have MyChart) OR A paper copy in the mail If you have any lab test that is abnormal or we need to change your treatment, we will call you to review the results.   Testing/Procedures: NONE   Follow-Up: At [facility], you and your health needs are our priority.  As part of our continuing mission to provide you with exceptional heart care, we have created designated Provider Care Teams.  These Care Teams include your primary Cardiologist (physician) and Advanced Practice Providers (APPs -  Physician Assistants and Nurse Practitioners) who all work together to provide you with the care you need, when you need it.  We recommend signing up for the patient portal called "MyChart".  Sign up information is provided on this After Visit Summary.  MyChart is used to connect with patients for Virtual Visits (Telemedicine).  Patients are able to view lab/test results, encounter notes, upcoming appointments, etc.  Non-urgent messages can be sent to your provider as well.   To learn more about what you can do with MyChart, go to [email or web address].    Your next appointment:   6 month(s)  Provider:   Gypsy Balsam, MD    Other Instructions

## 2022-12-23 ENCOUNTER — Telehealth: Payer: Self-pay

## 2022-12-23 LAB — LIPID PANEL
Chol/HDL Ratio: 1.9 ratio (ref 0.0–4.4)
Cholesterol, Total: 138 mg/dL (ref 100–199)
HDL: 73 mg/dL
LDL Chol Calc (NIH): 49 mg/dL (ref 0–99)
Triglycerides: 87 mg/dL (ref 0–149)
VLDL Cholesterol Cal: 16 mg/dL (ref 5–40)

## 2022-12-23 LAB — HEPATIC FUNCTION PANEL
ALT: 8 IU/L (ref 0–32)
AST: 13 IU/L (ref 0–40)
Albumin: 4.4 g/dL (ref 3.8–4.8)
Alkaline Phosphatase: 69 IU/L (ref 44–121)
Bilirubin Total: 0.4 mg/dL (ref 0.0–1.2)
Bilirubin, Direct: 0.14 mg/dL (ref 0.00–0.40)
Total Protein: 5.9 g/dL — ABNORMAL LOW (ref 6.0–8.5)

## 2022-12-23 NOTE — Telephone Encounter (Signed)
-----   Message from Flossie Dibble sent at 12/23/2022  9:40 AM EDT ----- Cholesterol is excellent. Continue current medications.

## 2022-12-23 NOTE — Telephone Encounter (Signed)
Patient notified through my chart.

## 2023-01-06 DIAGNOSIS — L570 Actinic keratosis: Secondary | ICD-10-CM | POA: Diagnosis not present

## 2023-01-06 DIAGNOSIS — D485 Neoplasm of uncertain behavior of skin: Secondary | ICD-10-CM | POA: Diagnosis not present

## 2023-01-07 DIAGNOSIS — T451X5A Adverse effect of antineoplastic and immunosuppressive drugs, initial encounter: Secondary | ICD-10-CM | POA: Diagnosis not present

## 2023-01-07 DIAGNOSIS — I2510 Atherosclerotic heart disease of native coronary artery without angina pectoris: Secondary | ICD-10-CM | POA: Diagnosis not present

## 2023-01-07 DIAGNOSIS — D6481 Anemia due to antineoplastic chemotherapy: Secondary | ICD-10-CM | POA: Diagnosis not present

## 2023-01-07 DIAGNOSIS — D509 Iron deficiency anemia, unspecified: Secondary | ICD-10-CM | POA: Diagnosis not present

## 2023-01-07 DIAGNOSIS — H903 Sensorineural hearing loss, bilateral: Secondary | ICD-10-CM | POA: Diagnosis not present

## 2023-01-07 DIAGNOSIS — C8339 Primary central nervous system lymphoma: Secondary | ICD-10-CM | POA: Diagnosis not present

## 2023-01-07 DIAGNOSIS — I480 Paroxysmal atrial fibrillation: Secondary | ICD-10-CM | POA: Diagnosis not present

## 2023-01-23 ENCOUNTER — Ambulatory Visit
Admission: EM | Admit: 2023-01-23 | Discharge: 2023-01-23 | Disposition: A | Discharge status: Home / Self Care | Payer: Medicare PPO | Attending: Physician Assistant | Admitting: Physician Assistant

## 2023-01-23 DIAGNOSIS — C83398 Diffuse large b-cell lymphoma of other extranodal and solid organ sites: Secondary | ICD-10-CM | POA: Diagnosis not present

## 2023-01-23 DIAGNOSIS — G9389 Other specified disorders of brain: Secondary | ICD-10-CM | POA: Diagnosis not present

## 2023-01-23 DIAGNOSIS — R410 Disorientation, unspecified: Secondary | ICD-10-CM | POA: Insufficient documentation

## 2023-01-23 DIAGNOSIS — I480 Paroxysmal atrial fibrillation: Secondary | ICD-10-CM | POA: Diagnosis not present

## 2023-01-23 DIAGNOSIS — Z7409 Other reduced mobility: Secondary | ICD-10-CM | POA: Diagnosis not present

## 2023-01-23 DIAGNOSIS — G936 Cerebral edema: Secondary | ICD-10-CM | POA: Diagnosis not present

## 2023-01-23 DIAGNOSIS — D508 Other iron deficiency anemias: Secondary | ICD-10-CM | POA: Diagnosis not present

## 2023-01-23 DIAGNOSIS — C8339 Primary central nervous system lymphoma: Secondary | ICD-10-CM | POA: Diagnosis not present

## 2023-01-23 DIAGNOSIS — I5032 Chronic diastolic (congestive) heart failure: Secondary | ICD-10-CM | POA: Diagnosis not present

## 2023-01-23 DIAGNOSIS — R0781 Pleurodynia: Secondary | ICD-10-CM | POA: Diagnosis not present

## 2023-01-23 DIAGNOSIS — R531 Weakness: Secondary | ICD-10-CM | POA: Diagnosis not present

## 2023-01-23 DIAGNOSIS — R4182 Altered mental status, unspecified: Secondary | ICD-10-CM | POA: Diagnosis not present

## 2023-01-23 DIAGNOSIS — R079 Chest pain, unspecified: Secondary | ICD-10-CM | POA: Diagnosis not present

## 2023-01-23 DIAGNOSIS — D496 Neoplasm of unspecified behavior of brain: Secondary | ICD-10-CM | POA: Diagnosis not present

## 2023-01-23 DIAGNOSIS — G919 Hydrocephalus, unspecified: Secondary | ICD-10-CM | POA: Diagnosis not present

## 2023-01-23 DIAGNOSIS — Z789 Other specified health status: Secondary | ICD-10-CM | POA: Diagnosis not present

## 2023-01-23 DIAGNOSIS — S0990XA Unspecified injury of head, initial encounter: Secondary | ICD-10-CM | POA: Diagnosis not present

## 2023-01-23 DIAGNOSIS — Z66 Do not resuscitate: Secondary | ICD-10-CM | POA: Diagnosis not present

## 2023-01-23 DIAGNOSIS — C8590 Non-Hodgkin lymphoma, unspecified, unspecified site: Secondary | ICD-10-CM | POA: Diagnosis not present

## 2023-01-23 DIAGNOSIS — G620 Drug-induced polyneuropathy: Secondary | ICD-10-CM | POA: Diagnosis not present

## 2023-01-23 DIAGNOSIS — D649 Anemia, unspecified: Secondary | ICD-10-CM | POA: Diagnosis not present

## 2023-01-23 DIAGNOSIS — I110 Hypertensive heart disease with heart failure: Secondary | ICD-10-CM | POA: Diagnosis not present

## 2023-01-23 LAB — POCT URINALYSIS DIP (MANUAL ENTRY)
Bilirubin, UA: NEGATIVE
Glucose, UA: NEGATIVE mg/dL
Ketones, POC UA: NEGATIVE mg/dL
Leukocytes, UA: NEGATIVE
Nitrite, UA: NEGATIVE
Protein Ur, POC: NEGATIVE mg/dL
Spec Grav, UA: 1.02 (ref 1.010–1.025)
Urobilinogen, UA: 0.2 U/dL
pH, UA: 7 (ref 5.0–8.0)

## 2023-01-23 LAB — POCT FASTING CBG KUC MANUAL ENTRY: POCT Glucose (KUC): 85 mg/dL (ref 70–99)

## 2023-01-23 NOTE — ED Provider Notes (Signed)
[facility]    CSN: [id number] Arrival date & time: 01/23/23  1129      History   Chief Complaint Chief Complaint  Patient presents with   Dysuria    HPI Kimberly Vaughn is a 77 y.o. female.   Patient here today with daughter for evaluation of some confusion that occurred yesterday.  Patient reports that she had felt loopy.  Her daughter states that she had missed some of her medications which was something patient had not done in the past.  She states that since taking her medications patient has improved significantly and does not seem as confused.  She was having dysuria but this has improved since her taking her antibiotics that she is prescribed to prevent UTIs.  Daughter just wanted her checked out given history of UTIs that required hospitalization.   Dysuria Associated symptoms: no abdominal pain, no fever, no nausea and no vomiting     Past Medical History:  Diagnosis Date   A-fib (HCC)    Acute blood loss anemia 03/16/2017   Acute cystitis without hematuria 06/06/2017   Acute deep vein thrombosis (DVT) of left upper extremity (HCC) 11/19/2016   unspecified vein, associated with PICC line   Acute megaloblastic anemia due to severe illness 11/12/2016   Anemia associated with chemotherapy 11/26/2016   Angina pectoris (HCC) 03/09/2016   Anginal pain (HCC)    Asthma    as a child   Asymmetric SNHL (sensorineural hearing loss) 04/15/2016   Ataxia 03/16/2017   Bacteremia due to methicillin susceptible Staphylococcus aureus (MSSA) 11/12/2016   Benign essential HTN    Bilateral impacted cerumen 01/05/2017   Bilateral lower extremity edema 11/12/2016   BPPV (benign paroxysmal positional vertigo) 12/25/2015   Brain mass 09/09/2016   Brain tumor (HCC) 09/04/2016   Corneal abrasion, left, sequela 12/03/2016   Coronary artery disease    Coronary artery disease involving native coronary artery of native heart 03/30/2016   Formatting of this note might be different from the  original. PTCA and drug-eluting stents to right coronary artery in January 2018 Formatting of this note might be different from the original. Overview:  PTCA and drug-eluting stents to right coronary artery in January 2018 Formatting of this note might be different from the original. Overview:  Drug-eluting stents to right coronary artery January   Cranial nerve VI palsy, left 11/26/2016   Cranial nerve VII palsy 11/26/2016   Formatting of this note might be different from the original. Added automatically from request for surgery [phone]   DDD (degenerative disc disease), lumbar    Debility    Degenerative joint disease    Diarrhea 03/16/2017   Diffuse large B cell lymphoma (HCC) 09/16/2016   Dizziness and giddiness    DSAP (disseminated superficial actinic porokeratosis) 11/14/2016   Dyslipidemia (high LDL; low HDL) 03/30/2016   Dysphagia    Dysrhythmia    A fib   Encounter for antineoplastic chemotherapy 04/22/2017   Episodic atrial fibrillation (HCC)    Esophageal reflux    Essential hypertension 09/01/2016   Gastroesophageal reflux disease    GERD (gastroesophageal reflux disease)    Hiatal hernia    History of brain tumor    History of coronary artery stent placement 01/07/2017   History of kidney stones    History of MI (myocardial infarction)    Hyperlipidemia 11/12/2016   Hypertension, essential    Hypoalbuminemia due to protein-calorie malnutrition (HCC)    Hypokalemia    Impaired mobility and ADLs  06/18/2020   Kidney stones    Klebsiella cystitis 11/12/2016   Labile blood pressure    Lagophthalmos of left upper eyelid 12/03/2016   Leucocytosis    Liver cyst LEFT   Mass of brain 09/04/2016   Myocardial infarction (HCC)    Nodule of parotid gland    Non-Hodgkin lymphoma (HCC)    Brain   Orthostatic hypotension 03/16/2017   PAF (paroxysmal atrial fibrillation) (HCC)    Pancytopenia (HCC) 11/12/2016   Paralytic lagophthalmos of left upper eyelid 03/16/2017   Formatting of this note  might be different from the original. Added automatically from request for surgery [id number]   Parotid duct obstruction 04/15/2016   Paroxysmal atrial fibrillation (HCC) 09/01/2016   Chads2Vas score equals 3, but only 1 episodes in 2016, recent event recorder showing no evidence of atrial fibrillation.   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/24/2016   Pulmonary nodule    PVC (premature ventricular contraction)    Nov 2017 & Feb 2018 noted on cardiac monitor worn at home   Pyelonephritis    Sepsis (HCC) 02/15/2018   Severe sepsis (HCC) 11/12/2016   Skipped heart beats 01/07/2017   Steroid-induced hyperglycemia    Subjective tinnitus of left ear 11/27/2015   Sudden hearing loss, left 02/28/2016   Trigger ring finger of right hand 07/07/2018   Unilateral deafness, left 04/25/2020   Unilateral facial paresis 07/01/2016   Vitamin D deficiency 11/26/2016    Patient Active Problem List   Diagnosis Date Noted   Iron deficiency anemia 10/12/2022   Idiopathic peripheral neuropathy 03/30/2022   Impaired mobility and ADLs 06/18/2020   A-fib (HCC)    Anginal pain (HCC)    Asthma    DDD (degenerative disc disease), lumbar    Degenerative joint disease    Dizziness and giddiness    Dysrhythmia    Episodic atrial fibrillation (HCC)    Esophageal reflux    GERD (gastroesophageal reflux disease)    Hiatal hernia    History of kidney stones    Hypertension, essential    Kidney stones    Liver cyst    Myocardial infarction (HCC)    Nodule of parotid gland    Non-Hodgkin lymphoma (HCC)    Pneumonia    PONV (postoperative nausea and vomiting)    Pulmonary nodule    Unilateral deafness, left 04/25/2020   Trigger ring finger of right hand 07/07/2018   Debility    Pyelonephritis    History of brain tumor    Sepsis (HCC) 02/15/2018   Acute cystitis without hematuria 06/06/2017   Encounter for antineoplastic  chemotherapy 04/22/2017   Acute blood loss anemia 03/16/2017   Ataxia 03/16/2017   Diarrhea 03/16/2017   Orthostatic hypotension 03/16/2017   Paralytic lagophthalmos of left upper eyelid 03/16/2017   History of coronary artery stent placement 01/07/2017   Skipped heart beats 01/07/2017   Bilateral impacted cerumen 01/05/2017   Lagophthalmos of left upper eyelid 12/03/2016   Corneal abrasion, left, sequela 12/03/2016   Cranial nerve VI palsy, left 11/26/2016   Cranial nerve VII palsy 11/26/2016   Anemia associated with chemotherapy 11/26/2016   Vitamin D deficiency 11/26/2016   Acute deep vein thrombosis (DVT) of left upper extremity (HCC) 11/19/2016   DSAP (disseminated superficial actinic porokeratosis) 11/14/2016   Severe sepsis (HCC) 11/12/2016   Bacteremia due to methicillin susceptible Staphylococcus aureus (MSSA) 11/12/2016  Klebsiella cystitis 11/12/2016   Pancytopenia (HCC) 11/12/2016   Acute megaloblastic anemia due to severe illness 11/12/2016   Bilateral lower extremity edema 11/12/2016   Hyperlipidemia 11/12/2016   Primary CNS lymphoma 09/24/2016   Diffuse large B cell lymphoma (HCC) 09/16/2016   Premature atrial complex    Steroid-induced hyperglycemia    Labile blood pressure    Dysphagia    PVC (premature ventricular contraction)    Hypoalbuminemia due to protein-calorie malnutrition (HCC)    Leucocytosis    Brain mass 09/09/2016   Benign essential HTN    PAF (paroxysmal atrial fibrillation) (HCC)    Gastroesophageal reflux disease    History of MI (myocardial infarction)    Hypokalemia    Post-operative pain    Mass of brain 09/04/2016   Brain tumor (HCC) 09/04/2016   Coronary artery disease 09/01/2016   Essential hypertension 09/01/2016   Unilateral facial paresis 07/01/2016   Asymmetric SNHL (sensorineural hearing loss) 04/15/2016   Parotid duct obstruction 04/15/2016   Dyslipidemia (high LDL; low HDL) 03/30/2016   Coronary artery disease involving  native coronary artery of native heart 03/30/2016   Angina pectoris (HCC) 03/09/2016   Sudden hearing loss, left 02/28/2016   BPPV (benign paroxysmal positional vertigo) 12/25/2015   Paroxysmal atrial fibrillation (HCC) 11/30/2015   Subjective tinnitus of left ear 11/27/2015    Past Surgical History:  Procedure Laterality Date   APPLICATION OF CRANIAL NAVIGATION N/A 09/04/2016   Procedure: APPLICATION OF CRANIAL NAVIGATION;  Surgeon: Lisbeth Renshaw, MD;  Location: [facility] OR;  Service: Neurosurgery;  Laterality: N/A;   BRAIN SURGERY  09/04/2016   BREAST BIOPSY Bilateral    CARPAL TUNNEL RELEASE Right 09/2013   CATARACTS Bilateral    CERVICAL SPINE SURGERY  04/2012   FINGER SURGERY Right    TRIGGER FINGER RELEASE   PARTIAL THYMECTOMY  1973   pt states doesn't remember this being done   RETROSIGMOID CRANIECTOMY FOR TUMOR RESECTION Left 09/04/2016   Procedure: RETROSIGMOID CRANIECTOMY FOR TUMOR RESECTION WITH BRAINLAB NAVIGATION;  Surgeon: Lisbeth Renshaw, MD;  Location: [facility] OR;  Service: Neurosurgery;  Laterality: Left;  CRANIOTOMY TUMOR EXCISION   TONSILLECTOMY  1964   VAGINAL HYSTERECTOMY  1974    OB History   No obstetric history on file.      Home Medications    Prior to Admission medications   Medication Sig Start Date End Date Taking? Authorizing Provider  ferrous sulfate 325 (65 FE) MG tablet Take 325 mg by mouth daily with breakfast.   Yes [provider]  atorvastatin (LIPITOR) 40 MG tablet TAKE 1 TABLET(40 MG) BY MOUTH DAILY 11/13/22   Flossie Dibble, NP  Carboxymethylcellulose Sod PF 0.5 % SOLN Apply 1 drop to eye 4 (four) times daily. One drop in left eye 4 times daily.    [provider]  Cholecalciferol (VITAMIN D3) 2000 units TABS Take 2,000 Units by mouth daily.    [provider]  Cyanocobalamin (VITAMIN B-12) 5000 MCG SUBL Take 5,000 mcg by mouth daily.     [provider]  erythromycin ophthalmic ointment Place 1  application into the left eye 4 (four) times daily. Unknown strength    [provider]  esomeprazole (NEXIUM) 40 MG capsule Take 40 mg by mouth daily. 07/27/16   [provider]  lenalidomide (REVLIMID) 10 MG capsule Take 10 mg by mouth daily.    [provider]  magnesium chloride (SLOW-MAG) 64 MG TBEC SR tablet Take 1 tablet by mouth 2 (two) times  daily.     [provider]  meclizine (ANTIVERT) 25 MG tablet Take 25 mg by mouth daily as needed for dizziness.    [provider]  metoprolol tartrate (LOPRESSOR) 25 MG tablet Take 1 tablet (25 mg total) by mouth 2 (two) times daily. 07/22/22   Flossie Dibble, NP  nitrofurantoin, macrocrystal-monohydrate, (MACROBID) 100 MG capsule Take 100 mg by mouth daily. 07/18/18   [provider]  nitroGLYCERIN (NITROSTAT) 0.4 MG SL tablet Place 1 tablet (0.4 mg total) under the tongue every 5 (five) minutes as needed for chest pain. 12/30/16   Georgeanna Lea, MD  Potassium Chloride ER 20 MEQ TBCR Take 1 tablet by mouth 2 (two) times daily. 2  In the morning and 1 at night    [provider]  rivaroxaban (XARELTO) 10 MG TABS tablet Take 10 mg by mouth daily with supper.     [provider]  sennosides-docusate sodium (SENOKOT-S) 8.6-50 MG tablet Take 1 tablet by mouth 2 (two) times daily.    [provider]  tamsulosin (FLOMAX) 0.4 MG CAPS capsule Take 0.4 mg by mouth daily.    [provider]    Family History Family History  Problem Relation Age of Onset   Heart disease Mother 53   Psoriasis Mother        PUSTULAR   Hypertension Mother    Rheum arthritis Mother    Heart attack Father 77   Heart disease Father    Lymphoma Sister    Hypertension Sister    Melanoma Brother    Hypertension Brother    Rheum arthritis Brother    Hypertension Daughter    Lymphoma Other    Osteoarthritis Other    Melanoma Other    Hypertension Other     Social  History Social History   Tobacco Use   Smoking status: Former    Current packs/day: 0.00    Types: Cigarettes    Quit date: 10/23/2005    Years since quitting: 17.2   Smokeless tobacco: Never  Vaping Use   Vaping status: Never Used  Substance Use Topics   Alcohol use: No   Drug use: No     Allergies   Patient has no known allergies.   Review of Systems Review of Systems  Constitutional:  Negative for chills and fever.  Eyes:  Negative for discharge and redness.  Gastrointestinal:  Negative for abdominal pain, nausea and vomiting.  Genitourinary:  Positive for dysuria.  Psychiatric/Behavioral:  Positive for confusion.      Physical Exam Triage Vital Signs ED Triage Vitals  Encounter Vitals Group     BP 01/23/23 1148 133/76     Systolic BP Percentile --      Diastolic BP Percentile --      Pulse Rate 01/23/23 1148 68     Resp 01/23/23 1148 18     Temp 01/23/23 1148 98.6 F (37 C)     Temp Source 01/23/23 1148 Oral     SpO2 01/23/23 1148 97 %     Weight --      Height --      Head Circumference --      Peak Flow --      Pain Score 01/23/23 1151 0     Pain Loc --      Pain Education --      Exclude from Growth Chart --    No data found.  Updated Vital Signs BP 133/76 (BP Location:  Right Arm)   Pulse 68   Temp 98.6 F (37 C) (Oral)   Resp 18   LMP  (LMP Unknown)   SpO2 97%      Physical Exam Vitals and nursing note reviewed.  Constitutional:      General: She is not in acute distress.    Appearance: Normal appearance. She is not ill-appearing.  HENT:     Head: Normocephalic and atraumatic.  Eyes:     Conjunctiva/sclera: Conjunctivae normal.  Cardiovascular:     Rate and Rhythm: Normal rate and regular rhythm.  Pulmonary:     Effort: Pulmonary effort is normal. No respiratory distress.     Breath sounds: No wheezing, rhonchi or rales.  Neurological:     Mental Status: She is alert and oriented to person, place, and time.  Psychiatric:         Mood and Affect: Mood normal.        Behavior: Behavior normal.        Thought Content: Thought content normal.      UC Treatments / Results  Labs (all labs ordered are listed, but only abnormal results are displayed) Labs Reviewed  POCT URINALYSIS DIP (MANUAL ENTRY) - Abnormal; Notable for the following components:      Result Value   Color, UA light yellow (*)    Clarity, UA cloudy (*)    Blood, UA trace-intact (*)    All other components within normal limits  URINE CULTURE  POCT FASTING CBG [facility] MANUAL ENTRY    EKG   Radiology No results found.  Procedures Procedures (including critical care time)  Medications Ordered in UC Medications - No data to display  Initial Impression / Assessment and Plan / UC Course  I have reviewed the triage vital signs and the nursing notes.  Pertinent labs & imaging results that were available during my care of the patient were reviewed by me and considered in my medical decision making (see chart for details).    Patient appears to be at baseline with mentation and is oriented to person place and date.  She follows directions well and converses without complication.  Suspect missing medication dosages created symptoms which have resolved since resuming medications.  Advised daughter to continue to monitor patient's mental status and other symptoms and follow-up in the emergency department should they recur.   Final Clinical Impressions(s) / UC Diagnoses   Final diagnoses:  Subacute confusional state   Discharge Instructions   None    ED Prescriptions   None    PDMP not reviewed this encounter.   Tomi Bamberger, PA-C 01/23/23 1320

## 2023-01-23 NOTE — ED Triage Notes (Signed)
Per daughter, patient has had hx of brain tumor in 2018. Patient has residual facial droop, left eye blindness as a result. Patient lives with her spouse and per report, patient has not taken her home meds since Wednesday. Patient complaining of frequent urination with "burning". Urine provided on arrival and appears clear yellow. Patient oriented to person, place, date, and circumstances.Able to follow directions well and conversing with nurse without difficulty.

## 2023-01-24 LAB — URINE CULTURE

## 2023-01-29 DIAGNOSIS — C8339 Primary central nervous system lymphoma: Secondary | ICD-10-CM | POA: Diagnosis not present

## 2023-01-29 DIAGNOSIS — H401111 Primary open-angle glaucoma, right eye, mild stage: Secondary | ICD-10-CM | POA: Diagnosis not present

## 2023-01-29 DIAGNOSIS — C83398 Diffuse large b-cell lymphoma of other extranodal and solid organ sites: Secondary | ICD-10-CM | POA: Diagnosis not present

## 2023-01-29 DIAGNOSIS — K8080 Other cholelithiasis without obstruction: Secondary | ICD-10-CM | POA: Diagnosis not present

## 2023-01-29 DIAGNOSIS — E46 Unspecified protein-calorie malnutrition: Secondary | ICD-10-CM | POA: Diagnosis not present

## 2023-01-29 DIAGNOSIS — Z7409 Other reduced mobility: Secondary | ICD-10-CM | POA: Diagnosis not present

## 2023-01-29 DIAGNOSIS — D509 Iron deficiency anemia, unspecified: Secondary | ICD-10-CM | POA: Diagnosis not present

## 2023-01-29 DIAGNOSIS — D6481 Anemia due to antineoplastic chemotherapy: Secondary | ICD-10-CM | POA: Diagnosis not present

## 2023-01-29 DIAGNOSIS — K8020 Calculus of gallbladder without cholecystitis without obstruction: Secondary | ICD-10-CM | POA: Diagnosis not present

## 2023-01-29 DIAGNOSIS — I480 Paroxysmal atrial fibrillation: Secondary | ICD-10-CM | POA: Diagnosis not present

## 2023-01-29 DIAGNOSIS — K7689 Other specified diseases of liver: Secondary | ICD-10-CM | POA: Diagnosis not present

## 2023-01-29 DIAGNOSIS — R296 Repeated falls: Secondary | ICD-10-CM | POA: Diagnosis not present

## 2023-01-29 DIAGNOSIS — H43391 Other vitreous opacities, right eye: Secondary | ICD-10-CM | POA: Diagnosis not present

## 2023-01-29 DIAGNOSIS — G629 Polyneuropathy, unspecified: Secondary | ICD-10-CM | POA: Diagnosis not present

## 2023-01-29 DIAGNOSIS — H35441 Age-related reticular degeneration of retina, right eye: Secondary | ICD-10-CM | POA: Diagnosis not present

## 2023-01-29 DIAGNOSIS — H17812 Minor opacity of cornea, left eye: Secondary | ICD-10-CM | POA: Diagnosis not present

## 2023-01-29 DIAGNOSIS — R531 Weakness: Secondary | ICD-10-CM | POA: Diagnosis not present

## 2023-01-29 DIAGNOSIS — Z5112 Encounter for antineoplastic immunotherapy: Secondary | ICD-10-CM | POA: Diagnosis not present

## 2023-01-29 DIAGNOSIS — H04122 Dry eye syndrome of left lacrimal gland: Secondary | ICD-10-CM | POA: Diagnosis not present

## 2023-01-29 DIAGNOSIS — Z961 Presence of intraocular lens: Secondary | ICD-10-CM | POA: Diagnosis not present

## 2023-01-29 DIAGNOSIS — H16221 Keratoconjunctivitis sicca, not specified as Sjogren's, right eye: Secondary | ICD-10-CM | POA: Diagnosis not present

## 2023-01-29 DIAGNOSIS — H02125 Mechanical ectropion of left lower eyelid: Secondary | ICD-10-CM | POA: Diagnosis not present

## 2023-01-29 DIAGNOSIS — T451X5A Adverse effect of antineoplastic and immunosuppressive drugs, initial encounter: Secondary | ICD-10-CM | POA: Diagnosis not present

## 2023-01-29 DIAGNOSIS — Z789 Other specified health status: Secondary | ICD-10-CM | POA: Diagnosis not present

## 2023-01-29 DIAGNOSIS — M6281 Muscle weakness (generalized): Secondary | ICD-10-CM | POA: Diagnosis not present

## 2023-01-31 DIAGNOSIS — G629 Polyneuropathy, unspecified: Secondary | ICD-10-CM | POA: Diagnosis not present

## 2023-01-31 DIAGNOSIS — D509 Iron deficiency anemia, unspecified: Secondary | ICD-10-CM | POA: Diagnosis not present

## 2023-01-31 DIAGNOSIS — R296 Repeated falls: Secondary | ICD-10-CM | POA: Diagnosis not present

## 2023-01-31 DIAGNOSIS — E46 Unspecified protein-calorie malnutrition: Secondary | ICD-10-CM | POA: Diagnosis not present

## 2023-01-31 DIAGNOSIS — I480 Paroxysmal atrial fibrillation: Secondary | ICD-10-CM | POA: Diagnosis not present

## 2023-01-31 DIAGNOSIS — M6281 Muscle weakness (generalized): Secondary | ICD-10-CM | POA: Diagnosis not present

## 2023-01-31 DIAGNOSIS — C8339 Primary central nervous system lymphoma: Secondary | ICD-10-CM | POA: Diagnosis not present

## 2023-01-31 DIAGNOSIS — K8080 Other cholelithiasis without obstruction: Secondary | ICD-10-CM | POA: Diagnosis not present

## 2023-02-01 DIAGNOSIS — T451X5A Adverse effect of antineoplastic and immunosuppressive drugs, initial encounter: Secondary | ICD-10-CM | POA: Diagnosis not present

## 2023-02-01 DIAGNOSIS — C8339 Primary central nervous system lymphoma: Secondary | ICD-10-CM | POA: Diagnosis not present

## 2023-02-01 DIAGNOSIS — D6481 Anemia due to antineoplastic chemotherapy: Secondary | ICD-10-CM | POA: Diagnosis not present

## 2023-02-01 DIAGNOSIS — Z5112 Encounter for antineoplastic immunotherapy: Secondary | ICD-10-CM | POA: Diagnosis not present

## 2023-02-01 DIAGNOSIS — D509 Iron deficiency anemia, unspecified: Secondary | ICD-10-CM | POA: Diagnosis not present

## 2023-02-08 DIAGNOSIS — C8339 Primary central nervous system lymphoma: Secondary | ICD-10-CM | POA: Diagnosis not present

## 2023-02-08 DIAGNOSIS — D509 Iron deficiency anemia, unspecified: Secondary | ICD-10-CM | POA: Diagnosis not present

## 2023-02-08 DIAGNOSIS — T451X5A Adverse effect of antineoplastic and immunosuppressive drugs, initial encounter: Secondary | ICD-10-CM | POA: Diagnosis not present

## 2023-02-08 DIAGNOSIS — Z5112 Encounter for antineoplastic immunotherapy: Secondary | ICD-10-CM | POA: Diagnosis not present

## 2023-02-08 DIAGNOSIS — D6481 Anemia due to antineoplastic chemotherapy: Secondary | ICD-10-CM | POA: Diagnosis not present

## 2023-02-09 DIAGNOSIS — H401111 Primary open-angle glaucoma, right eye, mild stage: Secondary | ICD-10-CM | POA: Diagnosis not present

## 2023-02-09 DIAGNOSIS — H35441 Age-related reticular degeneration of retina, right eye: Secondary | ICD-10-CM | POA: Diagnosis not present

## 2023-02-09 DIAGNOSIS — H16221 Keratoconjunctivitis sicca, not specified as Sjogren's, right eye: Secondary | ICD-10-CM | POA: Diagnosis not present

## 2023-02-09 DIAGNOSIS — H43391 Other vitreous opacities, right eye: Secondary | ICD-10-CM | POA: Diagnosis not present

## 2023-02-09 DIAGNOSIS — H17812 Minor opacity of cornea, left eye: Secondary | ICD-10-CM | POA: Diagnosis not present

## 2023-02-09 DIAGNOSIS — H02125 Mechanical ectropion of left lower eyelid: Secondary | ICD-10-CM | POA: Diagnosis not present

## 2023-02-09 DIAGNOSIS — Z961 Presence of intraocular lens: Secondary | ICD-10-CM | POA: Diagnosis not present

## 2023-02-14 DIAGNOSIS — H04122 Dry eye syndrome of left lacrimal gland: Secondary | ICD-10-CM | POA: Diagnosis not present

## 2023-02-14 DIAGNOSIS — E46 Unspecified protein-calorie malnutrition: Secondary | ICD-10-CM | POA: Diagnosis not present

## 2023-02-14 DIAGNOSIS — C8339 Primary central nervous system lymphoma: Secondary | ICD-10-CM | POA: Diagnosis not present

## 2023-02-15 DIAGNOSIS — C8339 Primary central nervous system lymphoma: Secondary | ICD-10-CM | POA: Diagnosis not present

## 2023-02-15 DIAGNOSIS — D6481 Anemia due to antineoplastic chemotherapy: Secondary | ICD-10-CM | POA: Diagnosis not present

## 2023-02-15 DIAGNOSIS — Z5112 Encounter for antineoplastic immunotherapy: Secondary | ICD-10-CM | POA: Diagnosis not present

## 2023-02-15 DIAGNOSIS — T451X5A Adverse effect of antineoplastic and immunosuppressive drugs, initial encounter: Secondary | ICD-10-CM | POA: Diagnosis not present

## 2023-02-15 DIAGNOSIS — D509 Iron deficiency anemia, unspecified: Secondary | ICD-10-CM | POA: Diagnosis not present

## 2023-02-19 DIAGNOSIS — K7689 Other specified diseases of liver: Secondary | ICD-10-CM | POA: Diagnosis not present

## 2023-02-19 DIAGNOSIS — C8339 Primary central nervous system lymphoma: Secondary | ICD-10-CM | POA: Diagnosis not present

## 2023-02-19 DIAGNOSIS — K8020 Calculus of gallbladder without cholecystitis without obstruction: Secondary | ICD-10-CM | POA: Diagnosis not present

## 2023-02-22 DIAGNOSIS — D509 Iron deficiency anemia, unspecified: Secondary | ICD-10-CM | POA: Diagnosis not present

## 2023-02-22 DIAGNOSIS — T451X5A Adverse effect of antineoplastic and immunosuppressive drugs, initial encounter: Secondary | ICD-10-CM | POA: Diagnosis not present

## 2023-02-22 DIAGNOSIS — D6481 Anemia due to antineoplastic chemotherapy: Secondary | ICD-10-CM | POA: Diagnosis not present

## 2023-02-22 DIAGNOSIS — Z5112 Encounter for antineoplastic immunotherapy: Secondary | ICD-10-CM | POA: Diagnosis not present

## 2023-02-22 DIAGNOSIS — C8339 Primary central nervous system lymphoma: Secondary | ICD-10-CM | POA: Diagnosis not present

## 2023-03-05 DIAGNOSIS — M5136 Other intervertebral disc degeneration, lumbar region with discogenic back pain only: Secondary | ICD-10-CM | POA: Diagnosis not present

## 2023-03-05 DIAGNOSIS — C8339 Primary central nervous system lymphoma: Secondary | ICD-10-CM | POA: Diagnosis not present

## 2023-03-08 DIAGNOSIS — C8339 Primary central nervous system lymphoma: Secondary | ICD-10-CM | POA: Diagnosis not present

## 2023-03-08 DIAGNOSIS — D509 Iron deficiency anemia, unspecified: Secondary | ICD-10-CM | POA: Diagnosis not present

## 2023-03-08 DIAGNOSIS — R0989 Other specified symptoms and signs involving the circulatory and respiratory systems: Secondary | ICD-10-CM | POA: Diagnosis not present

## 2023-03-10 ENCOUNTER — Telehealth: Payer: Self-pay | Admitting: Cardiology

## 2023-03-10 NOTE — Telephone Encounter (Signed)
 Called the patient's daughter and she reported that the patient's blood pressure started running low in the range of 90's/30's. The [facility] doctor changed her Lopressor  to half a tablet per day. She is concerned that the doctor changed the patient's Lopressor  because she was taking that for her A-fib as well. The patient's daughter wanted to make Dr. Bernie aware in case he needed to prescribe another medication to help her A-fib.

## 2023-03-10 NOTE — Telephone Encounter (Signed)
 Pt c/o medication issue:  1. Name of Medication: metoprolol  tartrate (LOPRESSOR ) 25 MG tablet   2. How are you currently taking this medication (dosage and times per day)? Take 1 tablet (25 mg total) by mouth 2 (two) times daily.   3. Are you having a reaction (difficulty breathing--STAT)? No  4. What is your medication issue? Patient is at [facility] and the doctor there recommended that the patient only take half a tablet per day. The doctor recommended this because the patient BP was 90/36 on Sunday. Patient went to [facility] on Monday per pt's daughter. The patient's BP was still 90's/36 and was concerned that the [facility] doctor adjusted her medication and recommended to reach out to Dr. Bernie. Please advise.

## 2023-03-11 ENCOUNTER — Other Ambulatory Visit: Payer: Self-pay

## 2023-03-11 MED ORDER — METOPROLOL TARTRATE 25 MG PO TABS: 12.5000 mg | ORAL_TABLET | Freq: Every day | ORAL | 3 refills | Status: DC

## 2023-03-11 NOTE — Telephone Encounter (Signed)
 Called Macario, the patient's daughter, and informed her of Dr. Karry recommendation below:  If her heart rate is not excessive it is okay to cut down metoprolol  in half but that has been done and simply observe  Macario stated that the patient was only taking Metoprolol  tartrate 12.5 mg daily, 1/2 tablet in the am and 1/2 tablet in the pm. Spoke with Delon Hoover, NP and she stated that dose was fine for the patient to be taking.

## 2023-03-17 DIAGNOSIS — L570 Actinic keratosis: Secondary | ICD-10-CM | POA: Diagnosis not present

## 2023-03-30 DIAGNOSIS — C7931 Secondary malignant neoplasm of brain: Secondary | ICD-10-CM | POA: Diagnosis not present

## 2023-03-30 DIAGNOSIS — C8339 Primary central nervous system lymphoma: Secondary | ICD-10-CM | POA: Diagnosis not present

## 2023-03-30 DIAGNOSIS — Z79620 Long term (current) use of immunosuppressive biologic: Secondary | ICD-10-CM | POA: Diagnosis not present

## 2023-03-30 DIAGNOSIS — Z79899 Other long term (current) drug therapy: Secondary | ICD-10-CM | POA: Diagnosis not present

## 2023-04-02 DIAGNOSIS — C8339 Primary central nervous system lymphoma: Secondary | ICD-10-CM | POA: Diagnosis not present

## 2023-04-15 DIAGNOSIS — D6481 Anemia due to antineoplastic chemotherapy: Secondary | ICD-10-CM | POA: Diagnosis not present

## 2023-04-15 DIAGNOSIS — R4189 Other symptoms and signs involving cognitive functions and awareness: Secondary | ICD-10-CM | POA: Diagnosis not present

## 2023-04-15 DIAGNOSIS — C8339 Primary central nervous system lymphoma: Secondary | ICD-10-CM | POA: Diagnosis not present

## 2023-04-15 DIAGNOSIS — Z5112 Encounter for antineoplastic immunotherapy: Secondary | ICD-10-CM | POA: Diagnosis not present

## 2023-04-15 DIAGNOSIS — T451X5A Adverse effect of antineoplastic and immunosuppressive drugs, initial encounter: Secondary | ICD-10-CM | POA: Diagnosis not present

## 2023-04-18 DIAGNOSIS — F32A Depression, unspecified: Secondary | ICD-10-CM | POA: Diagnosis not present

## 2023-04-18 DIAGNOSIS — I959 Hypotension, unspecified: Secondary | ICD-10-CM | POA: Diagnosis not present

## 2023-04-18 DIAGNOSIS — E46 Unspecified protein-calorie malnutrition: Secondary | ICD-10-CM | POA: Diagnosis not present

## 2023-04-19 DIAGNOSIS — R2681 Unsteadiness on feet: Secondary | ICD-10-CM | POA: Diagnosis not present

## 2023-04-19 DIAGNOSIS — M6281 Muscle weakness (generalized): Secondary | ICD-10-CM | POA: Diagnosis not present

## 2023-04-19 DIAGNOSIS — C8339 Primary central nervous system lymphoma: Secondary | ICD-10-CM | POA: Diagnosis not present

## 2023-04-20 DIAGNOSIS — M6281 Muscle weakness (generalized): Secondary | ICD-10-CM | POA: Diagnosis not present

## 2023-04-20 DIAGNOSIS — C8339 Primary central nervous system lymphoma: Secondary | ICD-10-CM | POA: Diagnosis not present

## 2023-04-20 DIAGNOSIS — R2681 Unsteadiness on feet: Secondary | ICD-10-CM | POA: Diagnosis not present

## 2023-04-20 DIAGNOSIS — Z79899 Other long term (current) drug therapy: Secondary | ICD-10-CM | POA: Diagnosis not present

## 2023-04-21 DIAGNOSIS — C8339 Primary central nervous system lymphoma: Secondary | ICD-10-CM | POA: Diagnosis not present

## 2023-04-21 DIAGNOSIS — M6281 Muscle weakness (generalized): Secondary | ICD-10-CM | POA: Diagnosis not present

## 2023-04-21 DIAGNOSIS — R2681 Unsteadiness on feet: Secondary | ICD-10-CM | POA: Diagnosis not present

## 2023-04-21 DIAGNOSIS — Z79899 Other long term (current) drug therapy: Secondary | ICD-10-CM | POA: Diagnosis not present

## 2023-04-22 DIAGNOSIS — M6281 Muscle weakness (generalized): Secondary | ICD-10-CM | POA: Diagnosis not present

## 2023-04-22 DIAGNOSIS — R2681 Unsteadiness on feet: Secondary | ICD-10-CM | POA: Diagnosis not present

## 2023-04-22 DIAGNOSIS — C8339 Primary central nervous system lymphoma: Secondary | ICD-10-CM | POA: Diagnosis not present

## 2023-04-23 DIAGNOSIS — M6281 Muscle weakness (generalized): Secondary | ICD-10-CM | POA: Diagnosis not present

## 2023-04-23 DIAGNOSIS — R2681 Unsteadiness on feet: Secondary | ICD-10-CM | POA: Diagnosis not present

## 2023-04-23 DIAGNOSIS — C8339 Primary central nervous system lymphoma: Secondary | ICD-10-CM | POA: Diagnosis not present

## 2023-04-26 DIAGNOSIS — M6281 Muscle weakness (generalized): Secondary | ICD-10-CM | POA: Diagnosis not present

## 2023-04-26 DIAGNOSIS — C8339 Primary central nervous system lymphoma: Secondary | ICD-10-CM | POA: Diagnosis not present

## 2023-04-26 DIAGNOSIS — R2681 Unsteadiness on feet: Secondary | ICD-10-CM | POA: Diagnosis not present

## 2023-04-28 DIAGNOSIS — C8339 Primary central nervous system lymphoma: Secondary | ICD-10-CM | POA: Diagnosis not present

## 2023-04-28 DIAGNOSIS — M6281 Muscle weakness (generalized): Secondary | ICD-10-CM | POA: Diagnosis not present

## 2023-04-28 DIAGNOSIS — R2681 Unsteadiness on feet: Secondary | ICD-10-CM | POA: Diagnosis not present

## 2023-04-29 DIAGNOSIS — C8339 Primary central nervous system lymphoma: Secondary | ICD-10-CM | POA: Diagnosis not present

## 2023-04-29 DIAGNOSIS — R2681 Unsteadiness on feet: Secondary | ICD-10-CM | POA: Diagnosis not present

## 2023-04-29 DIAGNOSIS — M6281 Muscle weakness (generalized): Secondary | ICD-10-CM | POA: Diagnosis not present

## 2023-04-30 DIAGNOSIS — Z79899 Other long term (current) drug therapy: Secondary | ICD-10-CM | POA: Diagnosis not present

## 2023-05-03 DIAGNOSIS — I69352 Hemiplegia and hemiparesis following cerebral infarction affecting left dominant side: Secondary | ICD-10-CM | POA: Diagnosis not present

## 2023-05-03 DIAGNOSIS — R2681 Unsteadiness on feet: Secondary | ICD-10-CM | POA: Diagnosis not present

## 2023-05-03 DIAGNOSIS — R278 Other lack of coordination: Secondary | ICD-10-CM | POA: Diagnosis not present

## 2023-05-03 DIAGNOSIS — R1312 Dysphagia, oropharyngeal phase: Secondary | ICD-10-CM | POA: Diagnosis not present

## 2023-05-03 DIAGNOSIS — M6281 Muscle weakness (generalized): Secondary | ICD-10-CM | POA: Diagnosis not present

## 2023-05-03 DIAGNOSIS — C8339 Primary central nervous system lymphoma: Secondary | ICD-10-CM | POA: Diagnosis not present

## 2023-05-04 DIAGNOSIS — I69352 Hemiplegia and hemiparesis following cerebral infarction affecting left dominant side: Secondary | ICD-10-CM | POA: Diagnosis not present

## 2023-05-04 DIAGNOSIS — R278 Other lack of coordination: Secondary | ICD-10-CM | POA: Diagnosis not present

## 2023-05-04 DIAGNOSIS — C8339 Primary central nervous system lymphoma: Secondary | ICD-10-CM | POA: Diagnosis not present

## 2023-05-04 DIAGNOSIS — M6281 Muscle weakness (generalized): Secondary | ICD-10-CM | POA: Diagnosis not present

## 2023-05-04 DIAGNOSIS — R1312 Dysphagia, oropharyngeal phase: Secondary | ICD-10-CM | POA: Diagnosis not present

## 2023-05-04 DIAGNOSIS — R2681 Unsteadiness on feet: Secondary | ICD-10-CM | POA: Diagnosis not present

## 2023-05-05 DIAGNOSIS — L24A2 Irritant contact dermatitis due to fecal, urinary or dual incontinence: Secondary | ICD-10-CM | POA: Diagnosis not present

## 2023-05-06 DIAGNOSIS — C8339 Primary central nervous system lymphoma: Secondary | ICD-10-CM | POA: Diagnosis not present

## 2023-05-06 DIAGNOSIS — R2681 Unsteadiness on feet: Secondary | ICD-10-CM | POA: Diagnosis not present

## 2023-05-06 DIAGNOSIS — M6281 Muscle weakness (generalized): Secondary | ICD-10-CM | POA: Diagnosis not present

## 2023-05-06 DIAGNOSIS — R1312 Dysphagia, oropharyngeal phase: Secondary | ICD-10-CM | POA: Diagnosis not present

## 2023-05-06 DIAGNOSIS — T451X5A Adverse effect of antineoplastic and immunosuppressive drugs, initial encounter: Secondary | ICD-10-CM | POA: Diagnosis not present

## 2023-05-06 DIAGNOSIS — T451X5D Adverse effect of antineoplastic and immunosuppressive drugs, subsequent encounter: Secondary | ICD-10-CM | POA: Diagnosis not present

## 2023-05-06 DIAGNOSIS — R278 Other lack of coordination: Secondary | ICD-10-CM | POA: Diagnosis not present

## 2023-05-06 DIAGNOSIS — I69352 Hemiplegia and hemiparesis following cerebral infarction affecting left dominant side: Secondary | ICD-10-CM | POA: Diagnosis not present

## 2023-05-06 DIAGNOSIS — D6481 Anemia due to antineoplastic chemotherapy: Secondary | ICD-10-CM | POA: Diagnosis not present

## 2023-05-07 DIAGNOSIS — C8339 Primary central nervous system lymphoma: Secondary | ICD-10-CM | POA: Diagnosis not present

## 2023-05-07 DIAGNOSIS — R2681 Unsteadiness on feet: Secondary | ICD-10-CM | POA: Diagnosis not present

## 2023-05-07 DIAGNOSIS — I69352 Hemiplegia and hemiparesis following cerebral infarction affecting left dominant side: Secondary | ICD-10-CM | POA: Diagnosis not present

## 2023-05-07 DIAGNOSIS — R278 Other lack of coordination: Secondary | ICD-10-CM | POA: Diagnosis not present

## 2023-05-07 DIAGNOSIS — M6281 Muscle weakness (generalized): Secondary | ICD-10-CM | POA: Diagnosis not present

## 2023-05-07 DIAGNOSIS — R1312 Dysphagia, oropharyngeal phase: Secondary | ICD-10-CM | POA: Diagnosis not present

## 2023-05-09 DIAGNOSIS — M6281 Muscle weakness (generalized): Secondary | ICD-10-CM | POA: Diagnosis not present

## 2023-05-09 DIAGNOSIS — C8339 Primary central nervous system lymphoma: Secondary | ICD-10-CM | POA: Diagnosis not present

## 2023-05-09 DIAGNOSIS — R1312 Dysphagia, oropharyngeal phase: Secondary | ICD-10-CM | POA: Diagnosis not present

## 2023-05-09 DIAGNOSIS — I69352 Hemiplegia and hemiparesis following cerebral infarction affecting left dominant side: Secondary | ICD-10-CM | POA: Diagnosis not present

## 2023-05-09 DIAGNOSIS — R278 Other lack of coordination: Secondary | ICD-10-CM | POA: Diagnosis not present

## 2023-05-09 DIAGNOSIS — R2681 Unsteadiness on feet: Secondary | ICD-10-CM | POA: Diagnosis not present

## 2023-05-10 DIAGNOSIS — R2681 Unsteadiness on feet: Secondary | ICD-10-CM | POA: Diagnosis not present

## 2023-05-10 DIAGNOSIS — C8339 Primary central nervous system lymphoma: Secondary | ICD-10-CM | POA: Diagnosis not present

## 2023-05-10 DIAGNOSIS — R1312 Dysphagia, oropharyngeal phase: Secondary | ICD-10-CM | POA: Diagnosis not present

## 2023-05-10 DIAGNOSIS — I69352 Hemiplegia and hemiparesis following cerebral infarction affecting left dominant side: Secondary | ICD-10-CM | POA: Diagnosis not present

## 2023-05-10 DIAGNOSIS — R278 Other lack of coordination: Secondary | ICD-10-CM | POA: Diagnosis not present

## 2023-05-10 DIAGNOSIS — M6281 Muscle weakness (generalized): Secondary | ICD-10-CM | POA: Diagnosis not present

## 2023-05-11 DIAGNOSIS — C8339 Primary central nervous system lymphoma: Secondary | ICD-10-CM | POA: Diagnosis not present

## 2023-05-11 DIAGNOSIS — M6281 Muscle weakness (generalized): Secondary | ICD-10-CM | POA: Diagnosis not present

## 2023-05-11 DIAGNOSIS — R1312 Dysphagia, oropharyngeal phase: Secondary | ICD-10-CM | POA: Diagnosis not present

## 2023-05-11 DIAGNOSIS — R2681 Unsteadiness on feet: Secondary | ICD-10-CM | POA: Diagnosis not present

## 2023-05-11 DIAGNOSIS — I69352 Hemiplegia and hemiparesis following cerebral infarction affecting left dominant side: Secondary | ICD-10-CM | POA: Diagnosis not present

## 2023-05-11 DIAGNOSIS — R278 Other lack of coordination: Secondary | ICD-10-CM | POA: Diagnosis not present

## 2023-05-12 DIAGNOSIS — I69352 Hemiplegia and hemiparesis following cerebral infarction affecting left dominant side: Secondary | ICD-10-CM | POA: Diagnosis not present

## 2023-05-12 DIAGNOSIS — M6281 Muscle weakness (generalized): Secondary | ICD-10-CM | POA: Diagnosis not present

## 2023-05-12 DIAGNOSIS — R2681 Unsteadiness on feet: Secondary | ICD-10-CM | POA: Diagnosis not present

## 2023-05-12 DIAGNOSIS — R278 Other lack of coordination: Secondary | ICD-10-CM | POA: Diagnosis not present

## 2023-05-12 DIAGNOSIS — R1312 Dysphagia, oropharyngeal phase: Secondary | ICD-10-CM | POA: Diagnosis not present

## 2023-05-12 DIAGNOSIS — C8339 Primary central nervous system lymphoma: Secondary | ICD-10-CM | POA: Diagnosis not present

## 2023-06-01 DEATH — deceased
